# Patient Record
Sex: Female | Born: 1940 | Race: White | Hispanic: No | Marital: Married | Smoking: Never smoker
Health system: Southern US, Community
[De-identification: ages and names within clinical notes are randomized; demographics above are authoritative.]

## PROBLEM LIST (undated history)

## (undated) DIAGNOSIS — C50911 Malignant neoplasm of unspecified site of right female breast: Secondary | ICD-10-CM

## (undated) DIAGNOSIS — Z9289 Personal history of other medical treatment: Secondary | ICD-10-CM

## (undated) DIAGNOSIS — I10 Essential (primary) hypertension: Secondary | ICD-10-CM

## (undated) DIAGNOSIS — C50919 Malignant neoplasm of unspecified site of unspecified female breast: Secondary | ICD-10-CM

## (undated) DIAGNOSIS — K5090 Crohn's disease, unspecified, without complications: Secondary | ICD-10-CM

## (undated) DIAGNOSIS — K219 Gastro-esophageal reflux disease without esophagitis: Secondary | ICD-10-CM

## (undated) DIAGNOSIS — Z923 Personal history of irradiation: Secondary | ICD-10-CM

## (undated) DIAGNOSIS — M1990 Unspecified osteoarthritis, unspecified site: Secondary | ICD-10-CM

## (undated) HISTORY — DX: Crohn's disease, unspecified, without complications: K50.90

## (undated) HISTORY — PX: DILATION AND CURETTAGE OF UTERUS: SHX78

## (undated) HISTORY — DX: Malignant neoplasm of unspecified site of right female breast: C50.911

## (undated) HISTORY — PX: BREAST SURGERY: SHX581

## (undated) HISTORY — DX: Essential (primary) hypertension: I10

## (undated) HISTORY — DX: Gastro-esophageal reflux disease without esophagitis: K21.9

## (undated) HISTORY — DX: Unspecified osteoarthritis, unspecified site: M19.90

## (undated) HISTORY — DX: Personal history of other medical treatment: Z92.89

## (undated) HISTORY — PX: BREAST BIOPSY: SHX20

---
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
## 1978-05-04 HISTORY — PX: BRAIN SURGERY: SHX531

## 2010-07-30 LAB — HM COLONOSCOPY

## 2010-11-21 LAB — HM PAP SMEAR: HM Pap smear: NORMAL

## 2010-12-31 ENCOUNTER — Ambulatory Visit: Payer: Self-pay | Admitting: Internal Medicine

## 2010-12-31 LAB — HM DEXA SCAN

## 2010-12-31 LAB — HM MAMMOGRAPHY: HM Mammogram: NORMAL

## 2011-05-26 DIAGNOSIS — I10 Essential (primary) hypertension: Secondary | ICD-10-CM | POA: Diagnosis not present

## 2011-06-09 DIAGNOSIS — I10 Essential (primary) hypertension: Secondary | ICD-10-CM | POA: Diagnosis not present

## 2011-06-09 DIAGNOSIS — E785 Hyperlipidemia, unspecified: Secondary | ICD-10-CM | POA: Diagnosis not present

## 2011-06-22 DIAGNOSIS — K5010 Crohn's disease of large intestine without complications: Secondary | ICD-10-CM | POA: Diagnosis not present

## 2011-08-26 DIAGNOSIS — K5010 Crohn's disease of large intestine without complications: Secondary | ICD-10-CM | POA: Diagnosis not present

## 2011-09-01 DIAGNOSIS — H2510 Age-related nuclear cataract, unspecified eye: Secondary | ICD-10-CM | POA: Diagnosis not present

## 2011-10-12 DIAGNOSIS — K5090 Crohn's disease, unspecified, without complications: Secondary | ICD-10-CM | POA: Diagnosis not present

## 2011-10-12 DIAGNOSIS — K5010 Crohn's disease of large intestine without complications: Secondary | ICD-10-CM | POA: Diagnosis not present

## 2011-10-19 DIAGNOSIS — K5010 Crohn's disease of large intestine without complications: Secondary | ICD-10-CM | POA: Diagnosis not present

## 2011-11-27 ENCOUNTER — Ambulatory Visit (INDEPENDENT_AMBULATORY_CARE_PROVIDER_SITE_OTHER): Payer: Medicare Other | Admitting: Internal Medicine

## 2011-11-27 ENCOUNTER — Encounter: Payer: Self-pay | Admitting: Internal Medicine

## 2011-11-27 VITALS — BP 136/78 | HR 72 | Temp 98.4°F | Resp 14 | Ht 61.5 in | Wt 138.2 lb

## 2011-11-27 DIAGNOSIS — R635 Abnormal weight gain: Secondary | ICD-10-CM | POA: Diagnosis not present

## 2011-11-27 DIAGNOSIS — E785 Hyperlipidemia, unspecified: Secondary | ICD-10-CM | POA: Diagnosis not present

## 2011-11-27 DIAGNOSIS — R5383 Other fatigue: Secondary | ICD-10-CM

## 2011-11-27 DIAGNOSIS — R32 Unspecified urinary incontinence: Secondary | ICD-10-CM

## 2011-11-27 DIAGNOSIS — Z1239 Encounter for other screening for malignant neoplasm of breast: Secondary | ICD-10-CM

## 2011-11-27 DIAGNOSIS — R5381 Other malaise: Secondary | ICD-10-CM

## 2011-11-27 DIAGNOSIS — K5090 Crohn's disease, unspecified, without complications: Secondary | ICD-10-CM

## 2011-11-27 LAB — CBC WITH DIFFERENTIAL/PLATELET
Basophils Relative: 0.7 % (ref 0.0–3.0)
Eosinophils Relative: 4.7 % (ref 0.0–5.0)
HCT: 41.2 % (ref 36.0–46.0)
Hemoglobin: 13.8 g/dL (ref 12.0–15.0)
Lymphocytes Relative: 43.2 % (ref 12.0–46.0)
Lymphs Abs: 2.7 10*3/uL (ref 0.7–4.0)
Monocytes Relative: 8 % (ref 3.0–12.0)
Neutro Abs: 2.7 10*3/uL (ref 1.4–7.7)
RBC: 4.5 Mil/uL (ref 3.87–5.11)

## 2011-11-27 LAB — POCT URINALYSIS DIPSTICK
Bilirubin, UA: NEGATIVE
Blood, UA: NEGATIVE
Glucose, UA: NEGATIVE
Ketones, UA: NEGATIVE
Nitrite, UA: NEGATIVE
pH, UA: 5.5

## 2011-11-27 LAB — LIPID PANEL
HDL: 90 mg/dL (ref >39.00)
VLDL: 12.8 mg/dL (ref 0.0–40.0)

## 2011-11-27 LAB — TSH: TSH: 1.7 u[IU]/mL (ref 0.35–5.50)

## 2011-11-27 NOTE — Progress Notes (Addendum)
Patient ID: Kim Sanchez, female   DOB: 07/05/1940, 71 y.o.   MRN: [id number]  Patient Active Problem List  Diagnosis  . Crohn's disease  . Hyperlipidemia  . Arthritis  . GERD (gastroesophageal reflux disease)  . History of blood transfusion  . Weight gain    Subjective:  CC:   Chief Complaint  Patient presents with  . New Patient    HPI:   Kim Sanchez is a 71 y.o. female who presents as a new patient to establish primary care with the chief complaint of  Weight gain of 10 lbs.  Does not exercise regularly, has retired recently which is a big change in lifestyle. Has a sweet tooth.  Eats dessert daily.  History of crohn's diease 2005 by Colonoscopy with wt loss hematochezia, down to 99 lbs. [facility], Kim Sanchez,  managed with remicaide infusions every 8 weeks,  Next one due in August. Relocated from [place].  Fibrocystic breast disease, last mammogram July 2012. Last PAP and colonoscopy 2012,  Wants to repeat the PAP annually.  History of palpitations and Heart murmur,  ECHO reportedly normal Done in [place] . intolerance to statins ,   But has never tried crestor,  On red yeast rice    Past Medical History  Diagnosis Date  . Crohn's disease   . Arthritis   . GERD (gastroesophageal reflux disease)   . History of blood transfusion 1960    Past Surgical History  Procedure Date  . Dilation and curettage of uterus   . Brain surgery 1980    breast biopsies, benign    Family History  Problem Relation Age of Onset  . Cancer Paternal Grandmother     History   Social History  . Marital Status: Married    Spouse Name: N/A    Number of Children: N/A  . Years of Education: N/A   Occupational History  . Not on file.   Social History Main Topics  . Smoking status: Never Smoker   . Smokeless tobacco: Never Used  . Alcohol Use: No  . Drug Use: No  . Sexually Active: Not on file   Other Topics Concern  . Not on file   Social History Narrative  . No  narrative on file   Allergies  Allergen Reactions  . Sulfa Antibiotics      Review of Systems:  Review of Systems  Constitutional: Negative.  Negative for weight loss.  HENT: Positive for sore throat. Negative for nosebleeds.   Eyes: Negative for blurred vision and pain.  Respiratory: Negative for cough, shortness of breath and wheezing.   Cardiovascular: Negative for chest pain, orthopnea and leg swelling.  Gastrointestinal: Negative for heartburn, abdominal pain and blood in stool.  Genitourinary: Negative for dysuria and hematuria.  Musculoskeletal: Positive for joint pain. Negative for myalgias.  Skin: Negative for rash.  Neurological: Negative for dizziness, sensory change and headaches.  Endo/Heme/Allergies: Does not bruise/bleed easily.  Psychiatric/Behavioral: Negative for depression. The patient does not have insomnia.      Objective:  BP 136/78  Pulse 72  Temp 98.4 F (36.9 C) (Oral)  Resp 14  Ht 5' 1.5" (1.562 m)  Wt 138 lb 4 oz (62.71 kg)  BMI 25.70 kg/m2  SpO2 99%  General appearance: alert, cooperative and appears stated age Ears: normal TM's and external ear canals both ears Throat: lips, mucosa, and tongue normal; teeth and gums normal Neck: no adenopathy, no carotid bruit, supple, symmetrical, trachea midline and thyroid not enlarged,  symmetric, no tenderness/mass/nodules Back: symmetric, no curvature. ROM normal. No CVA tenderness. Lungs: clear to auscultation bilaterally Heart: regular rate and rhythm, S1, S2 normal, no murmur, click, rub or gallop Abdomen: soft, non-tender; bowel sounds normal; no masses,  no organomegaly Pulses: 2+ and symmetric Skin: Skin color, texture, turgor normal. No rashes or lesions Lymph nodes: Cervical, supraclavicular, and axillary nodes normal.  Assessment and Plan:  Weight gain I  recommended a low glycemic index diet utilizing smaller more frequent meals to increase metabolism.  I have also recommended that  patient start exercising with a goal of 30 minutes of aerobic exercise a minimum of 5 days per week. Screening for lipid disorders, thyroid and diabetes to be done today.    Hyperlipidemia Managed with red yeast rice given her history of statin intolerance. She'll return prior to her annual physical for fasting lipids.  Crohn's disease Man[place]. She receives with a bimonthly Remicade infusions. Symptoms are currently controlled.   Updated Medication List Outpatient Encounter Prescriptions as of 11/27/2011  Medication Sig Dispense Refill  . Biotin 10 MG CAPS Take by mouth.      . Calcium Carb-Cholecalciferol (CALCIUM + D3) 600-200 MG-UNIT TABS Take by mouth.      . docusate sodium (COLACE) 100 MG capsule Take 100 mg by mouth 2 (two) times daily.      . fish oil-omega-3 fatty acids 1000 MG capsule Take 2 g by mouth daily.      Marland Kitchen ibuprofen (ADVIL,MOTRIN) 200 MG tablet Take 200 mg by mouth every 6 (six) hours as needed.      Marland Kitchen LORazepam (ATIVAN) 0.5 MG tablet Take 0.5 mg by mouth daily as needed.      . Multiple Vitamin (MULTIVITAMIN) tablet Take 1 tablet by mouth daily.      . polyethylene glycol (MIRALAX / GLYCOLAX) packet Take 17 g by mouth daily.      . Probiotic Product (PROBIOTIC DAILY PO) Take by mouth.      . Red Yeast Rice 600 MG CAPS Take by mouth.         Orders Placed This Encounter  Procedures  . MM Digital Screening  . Lipid panel  . COMPLETE METABOLIC PANEL WITH GFR  . TSH  . CBC with Differential  . LDL cholesterol, direct  . POCT Urinalysis Dipstick    No Follow-up on file.

## 2011-11-27 NOTE — Patient Instructions (Addendum)
Go to the Health Dept for your TdaP and possibly your pneumonvax vaccine (free there)   Return for annual PE with PAP and breast exam  Consider a Low Glycemic Index Diet and eating 6 smaller meals daily .  This frequent feeding stimulates your metabolism and the lower glycemic index foods will lower your blood sugars:   This is an example of my daily  "Low GI"  Diet:  All of the foods can be found at grocery stores and in bulk at [employer]  club   7 AM Breakfast:  Low carbohydrate Protein  Shakes (I recommend the EAS AdvantEdge "Carb Control" shakes  Or the low carb shakes by Atkins.   Both are available everywhere:  In  cases at [employer]  Or in 4 packs at grocery stores and pharmacies  2.5 carbs  (Alternative is  a toasted Arnold's Sandwhich Thin w/ peanut butter, a "Bagel Thin" with cream cheese and salmon) or  a scrambled egg burrito made with a low carb tortilla .  Avoid cereal and bananas, oatmeal too unless the old fashioned kind that takes 30-40 minutes to prepare.  the rest is overly processed, has minimal fiber, and loaded with carbohydrates!   10 AM: Protein bar by Atkins (the snack size, under 200 cal.  There are many varieties , available widely again or in bulk in limited varieties at [employer])  Other so called "protein bars" tend to be loaded with carbohydrates.  Remember, in food advertising, the word "energy" is synonymous for " carbohydrate."  Lunch: sandwich of [place], (or any lunchmeat or canned tuna), fresh avocado and cheese on a lower carbohydrate pita bread, flatbread, or tortilla . Ok to use mayonnaise. The bread is the only source or carbohydrate that can be decreased (Joseph's makes a pita bread and a flat bread  Are 50 cal and 4 net carbs ; Toufayan makes a low carb flatbread 100 cal and 9 net carbs  and  Mission makes a low carb whole wheat tortilla  210 cal and 6 net carbs)  3 PM:  Mid day :  Another proteintttt bThe brear,  Or a  cheese stick (100 cal, 0 carbs),  Or 1 ounce of  almonds,  walnuts, pistachios, pecans, peanuts,  Macadamia nuts. Or a Dannon light n Fit greek yogurt, 80 cal 8 net carbs . Avoid "granola"; the dried cranberries and raisins are loaded with carbohydrates.    6 PM  Dinner:  "mean and green:"  Meat/chicken/fish or a high protein legume; , with a green salad, and a low GI  Veggie (broccoli, cauliflower, green beans, spinach, brussel sprouts. Lima beans) : Avoid "Low fat dressings, Barry Brunner and [place]! They are loaded with sugar! Instead use ranch, vinagrette,  Blue cheese, etc  9 PM snack : Breyer's "low carb" fudgsicle or  ice cream bar (Carb Smart line), or  Weight Watcher's ice cream bar , or anouther "no sugar added" ice cream; or another protein shake or a serving of fresh fruit with whipped cream (Avoid bananas, pineapple, grapes  and watermelon on a regular basis because they are high in sugar)   Remember that snack Substitutions should be less than 15 to 20 carbs  Per serving. Remember to subtract fiber grams to get the "net carbs."

## 2011-11-28 LAB — COMPLETE METABOLIC PANEL WITH GFR
AST: 21 U/L (ref 0–37)
Albumin: 4.4 g/dL (ref 3.5–5.2)
Alkaline Phosphatase: 70 U/L (ref 39–117)
BUN: 16 mg/dL (ref 6–23)
GFR, Est Non African American: 87 mL/min
Glucose, Bld: 87 mg/dL (ref 70–99)
Potassium: 4.1 mEq/L (ref 3.5–5.3)
Sodium: 142 mEq/L (ref 135–145)
Total Bilirubin: 1 mg/dL (ref 0.3–1.2)

## 2011-11-29 ENCOUNTER — Encounter: Payer: Self-pay | Admitting: Internal Medicine

## 2011-11-29 DIAGNOSIS — R635 Abnormal weight gain: Secondary | ICD-10-CM | POA: Insufficient documentation

## 2011-11-29 DIAGNOSIS — K219 Gastro-esophageal reflux disease without esophagitis: Secondary | ICD-10-CM | POA: Insufficient documentation

## 2011-11-29 DIAGNOSIS — R32 Unspecified urinary incontinence: Secondary | ICD-10-CM | POA: Insufficient documentation

## 2011-11-29 DIAGNOSIS — M1990 Unspecified osteoarthritis, unspecified site: Secondary | ICD-10-CM | POA: Insufficient documentation

## 2011-11-29 DIAGNOSIS — Z9289 Personal history of other medical treatment: Secondary | ICD-10-CM | POA: Insufficient documentation

## 2011-11-29 NOTE — Assessment & Plan Note (Addendum)
I  recommended a low glycemic index diet utilizing smaller more frequent meals to increase metabolism.  I have also recommended that patient start exercising with a goal of 30 minutes of aerobic exercise a minimum of 5 days per week. Screening for lipid disorders, thyroid and diabetes was done today. There were no signs of diabetes or hypothyroidism.

## 2011-11-29 NOTE — Assessment & Plan Note (Signed)
Managed by gastroenterologist in [place]. She receives with a bimonthly Remicade infusions. Symptoms are currently controlled.

## 2011-11-29 NOTE — Assessment & Plan Note (Addendum)
Managed with red yeast rice given her history of statin intolerance. Although her LDL is 150 today her HDL is 90. She has no history of diabetes hypertension or early family history of coronary artery disease. No changes today

## 2011-11-29 NOTE — Assessment & Plan Note (Signed)
She has no signs of  urinary tract infection by dipstick UA. When she returns for her annual physical in one month will evaluate  for vaginal atrophy and cystocele.

## 2011-12-16 DIAGNOSIS — K5010 Crohn's disease of large intestine without complications: Secondary | ICD-10-CM | POA: Diagnosis not present

## 2011-12-25 ENCOUNTER — Encounter: Payer: Self-pay | Admitting: Internal Medicine

## 2011-12-30 DIAGNOSIS — L819 Disorder of pigmentation, unspecified: Secondary | ICD-10-CM | POA: Diagnosis not present

## 2011-12-30 DIAGNOSIS — D1801 Hemangioma of skin and subcutaneous tissue: Secondary | ICD-10-CM | POA: Diagnosis not present

## 2012-01-14 ENCOUNTER — Ambulatory Visit: Payer: Self-pay | Admitting: Internal Medicine

## 2012-01-14 DIAGNOSIS — Z1231 Encounter for screening mammogram for malignant neoplasm of breast: Secondary | ICD-10-CM | POA: Diagnosis not present

## 2012-01-18 ENCOUNTER — Encounter: Payer: Self-pay | Admitting: Internal Medicine

## 2012-01-18 ENCOUNTER — Ambulatory Visit (INDEPENDENT_AMBULATORY_CARE_PROVIDER_SITE_OTHER): Payer: Medicare Other | Admitting: Internal Medicine

## 2012-01-18 ENCOUNTER — Other Ambulatory Visit (HOSPITAL_COMMUNITY)
Admission: RE | Admit: 2012-01-18 | Discharge: 2012-01-18 | Disposition: A | Discharge status: Home / Self Care | Payer: Medicare Other | Source: Ambulatory Visit | Attending: Internal Medicine | Admitting: Internal Medicine

## 2012-01-18 VITALS — BP 148/70 | HR 80 | Temp 98.5°F | Resp 14 | Ht 61.5 in | Wt 139.8 lb

## 2012-01-18 DIAGNOSIS — Z1211 Encounter for screening for malignant neoplasm of colon: Secondary | ICD-10-CM | POA: Insufficient documentation

## 2012-01-18 DIAGNOSIS — Z1151 Encounter for screening for human papillomavirus (HPV): Secondary | ICD-10-CM | POA: Diagnosis not present

## 2012-01-18 DIAGNOSIS — Z124 Encounter for screening for malignant neoplasm of cervix: Secondary | ICD-10-CM | POA: Diagnosis not present

## 2012-01-18 DIAGNOSIS — Z0000 Encounter for general adult medical examination without abnormal findings: Secondary | ICD-10-CM

## 2012-01-18 DIAGNOSIS — K5090 Crohn's disease, unspecified, without complications: Secondary | ICD-10-CM

## 2012-01-18 DIAGNOSIS — Z1239 Encounter for other screening for malignant neoplasm of breast: Secondary | ICD-10-CM

## 2012-01-18 MED ORDER — LORAZEPAM 0.5 MG PO TABS: 0.5000 mg | ORAL_TABLET | Freq: Every day | ORAL | Status: DC | PRN

## 2012-01-18 NOTE — Assessment & Plan Note (Signed)
This exam was normal today. She is up-to-date on mammograms.

## 2012-01-18 NOTE — Progress Notes (Signed)
Patient ID: Kim Sanchez, female   DOB: 07-Jul-1940, 71 y.o.   MRN: [id number] The patient is here for annual Medicare wellness examination and management of other chronic and acute problems.   The risk factors are reflected in the social history.  The roster of all physicians providing medical care to patient - is listed in the Snapshot section of the chart.  Activities of daily living:  The patient is 100% independent in all ADLs: dressing, toileting, feeding as well as independent mobility  Home safety : The patient has smoke detectors in the home. They wear seatbelts.  There are no firearms at home. There is no violence in the home.   There is no risks for hepatitis, STDs or HIV. There is no   history of blood transfusion. They have no travel history to infectious disease endemic areas of the world.  The patient has seen their dentist in the last six month. They have seen their eye doctor in the last year. They admit to slight hearing difficulty with regard to whispered voices and some television programs.  They have deferred audiologic testing in the last year.  They do not  have excessive sun exposure. Discussed the need for sun protection: hats, long sleeves and use of sunscreen if there is significant sun exposure.   Diet: the importance of a healthy diet is discussed. They do have a healthy diet.  The benefits of regular aerobic exercise were discussed. She walks 4 times per week ,  20 minutes.   Depression screen: there are no signs or vegative symptoms of depression- irritability, change in appetite, anhedonia, sadness/tearfullness.  Cognitive assessment: the patient manages all their financial and personal affairs and is actively engaged. They could relate day,date,year and events; recalled 2/3 objects at 3 minutes; performed clock-face test normally.  The following portions of the patient's history were reviewed and updated as appropriate: allergies, current medications, past family  history, past medical history,  past surgical history, past social history  and problem list.  Visual acuity was not assessed per patient preference since she has regular follow up with her ophthalmologist. Hearing and body mass index were assessed and reviewed.   During the course of the visit the patient was educated and counseled about appropriate screening and preventive services including : fall prevention , diabetes screening, nutrition counseling, colorectal cancer screening, and recommended immunizations.    BP 148/70  Pulse 80  Temp 98.5 F (36.9 C) (Oral)  Resp 14  Ht 5' 1.5" (1.562 m)  Wt 139 lb 12 oz (63.39 kg)  BMI 25.98 kg/m2  SpO2 96%  .General appearance: alert, cooperative and appears stated age Head: Normocephalic, without obvious abnormality, atraumatic Eyes: conjunctivae/corneas clear. PERRL, EOM's intact. Fundi benign. Ears: normal TM's and external ear canals both ears Nose: Nares normal. Septum midline. Mucosa normal. No drainage or sinus tenderness. Throat: lips, mucosa, and tongue normal; teeth and gums normal Neck: no adenopathy, no carotid bruit, no JVD, supple, symmetrical, trachea midline and thyroid not enlarged, symmetric, no tenderness/mass/nodules Lungs: clear to auscultation bilaterally Breasts: normal appearance, no masses or tenderness Heart: regular rate and rhythm, S1, S2 normal, no murmur, click, rub or gallop Abdomen: soft, non-tender; bowel sounds normal; no masses,  no organomegaly GYN: normal external genitalia, normal cervix, ovaries nonpalpable Extremities: extremities normal, atraumatic, no cyanosis or edema Pulses: 2+ and symmetric Skin: Skin color, texture, turgor normal. No rashes or lesions Neurologic: Alert and oriented X 3, normal strength and tone. Normal symmetric reflexes. Normal  coordination and gait.  Assessment and Plan  Screening for cervical cancer Patient has requested annual Pap smear despite recommendations for  screening every 2-3 years and not covered by Medicare for low-risk patient  Screening for breast cancer This exam was normal today. She is up-to-date on mammograms.  Screening for colon cancer She is up-to-date with screening for colon cancer due to her frequent colonoscopies  for management of inflammatory bowel disease.   Updated Medication List Outpatient Encounter Prescriptions as of 01/18/2012  Medication Sig Dispense Refill  . Biotin 10 MG CAPS Take by mouth.      . Calcium Carb-Cholecalciferol (CALCIUM + D3) 600-200 MG-UNIT TABS Take by mouth.      . docusate sodium (COLACE) 100 MG capsule Take 100 mg by mouth 2 (two) times daily.      . fish oil-omega-3 fatty acids 1000 MG capsule Take 2 g by mouth daily.      Marland Kitchen ibuprofen (ADVIL,MOTRIN) 200 MG tablet Take 200 mg by mouth every 6 (six) hours as needed.      Marland Kitchen LORazepam (ATIVAN) 0.5 MG tablet Take 1 tablet (0.5 mg total) by mouth daily as needed.  30 tablet  3  . Multiple Vitamin (MULTIVITAMIN) tablet Take 1 tablet by mouth daily.      . polyethylene glycol (MIRALAX / GLYCOLAX) packet Take 17 g by mouth daily.      . Probiotic Product (PROBIOTIC DAILY PO) Take by mouth.      . Red Yeast Rice 600 MG CAPS Take by mouth.      . DISCONTD: LORazepam (ATIVAN) 0.5 MG tablet Take 0.5 mg by mouth daily as needed.

## 2012-01-18 NOTE — Assessment & Plan Note (Signed)
Patient has requested annual Pap smear despite recommendations for screening every 2-3 years and not covered by Medicare for low-risk patient

## 2012-01-18 NOTE — Assessment & Plan Note (Signed)
She is up-to-date with screening for colon cancer due to her frequent colonoscopies  for management of inflammatory bowel disease.

## 2012-01-18 NOTE — Patient Instructions (Addendum)
The Tdap  Vaccine will cost $110.00 here at the office.  Check with Health Dept   The other vaccines are covered by Medicare but do have an administration fee ($40), so they may cost you less at the local pharmacies, unless your insurance requires you to have them done at the doctor's office.

## 2012-01-20 ENCOUNTER — Encounter: Payer: Self-pay | Admitting: Internal Medicine

## 2012-02-08 DIAGNOSIS — K5010 Crohn's disease of large intestine without complications: Secondary | ICD-10-CM | POA: Diagnosis not present

## 2012-03-10 DIAGNOSIS — Z23 Encounter for immunization: Secondary | ICD-10-CM | POA: Diagnosis not present

## 2012-04-12 DIAGNOSIS — K5010 Crohn's disease of large intestine without complications: Secondary | ICD-10-CM | POA: Diagnosis not present

## 2012-05-04 HISTORY — PX: BREAST BIOPSY: SHX20

## 2012-05-09 ENCOUNTER — Encounter: Payer: Self-pay | Admitting: Internal Medicine

## 2012-05-09 ENCOUNTER — Ambulatory Visit (INDEPENDENT_AMBULATORY_CARE_PROVIDER_SITE_OTHER): Payer: Medicare Other | Admitting: Internal Medicine

## 2012-05-09 VITALS — BP 150/88 | HR 68 | Temp 98.6°F | Resp 12 | Ht 61.5 in | Wt 140.0 lb

## 2012-05-09 DIAGNOSIS — J069 Acute upper respiratory infection, unspecified: Secondary | ICD-10-CM | POA: Diagnosis not present

## 2012-05-09 MED ORDER — AZITHROMYCIN 500 MG PO TABS: 500.0000 mg | ORAL_TABLET | Freq: Every day | ORAL | Status: DC

## 2012-05-09 MED ORDER — NYSTATIN 100000 UNIT/ML MT SUSP: 500000.0000 [IU] | Freq: Four times a day (QID) | OROMUCOSAL | Status: DC

## 2012-05-09 NOTE — Assessment & Plan Note (Signed)
This URI is most likely viral given the mild HEENT  Exam.  have explained that in viral URIS, an antibiotic will not help the symptoms and will increase the risk of developing diarrhea.,  Continue oral and nasal decongestants,  Ibuprofen 400 mg and tylenol 650 mq 8 hrs for aches and pains,  And will addtessalon 100 mg every 8 hours prn cough  And seconda round of abx only if symptoms worsen to include fevers, facial pain, purulent sputum./drainage.

## 2012-05-09 NOTE — Progress Notes (Signed)
Patient ID: Kim Sanchez, female   DOB: 03/15/1941, 72 y.o.   MRN: [id number]   . Patient Active Problem List  Diagnosis  . Crohn's disease  . Hyperlipidemia  . Arthritis  . GERD (gastroesophageal reflux disease)  . History of blood transfusion  . Weight gain  . Urinary incontinence  . Screening for cervical cancer  . Screening for breast cancer  . Screening for colon cancer  . Viral URI with cough    Subjective:  CC:   Chief Complaint  Patient presents with  . head congestion    headache, sore throat sinus drainage    HPI:   Kim Sanchez a 72 y.o. female who presents with a 3  day history of sore throat, cough and post nasal drip,  Using rock n rye to suppress the cough   Nasal drainage is clear byut having streaks of blood.  Warm salt water via [employer].  Voice is hoarse.  Ears not bothering her .   No fevers yet    Past Medical History  Diagnosis Date  . Crohn's disease   . Arthritis   . GERD (gastroesophageal reflux disease)   . History of blood transfusion 1960    Past Surgical History  Procedure Date  . Dilation and curettage of uterus   . Brain surgery 1980    breast biopsies, benign         The following portions of the patient's history were reviewed and updated as appropriate: Allergies, current medications, and problem list.    Review of Systems:   12 Pt  review of systems was negative except those addressed in the HPI,     History   Social History  . Marital Status: Married    Spouse Name: N/A    Number of Children: N/A  . Years of Education: N/A   Occupational History  . Not on file.   Social History Main Topics  . Smoking status: Never Smoker   . Smokeless tobacco: Never Used  . Alcohol Use: No  . Drug Use: No  . Sexually Active: Not on file   Other Topics Concern  . Not on file   Social History Narrative  . No narrative on file    Objective:  BP 150/88  Pulse 68  Temp 98.6 F (37 C) (Oral)  Resp 12  Ht 5'  1.5" (1.562 m)  Wt 140 lb (63.504 kg)  BMI 26.02 kg/m2  SpO2 96%  General appearance: alert, cooperative and appears stated age Ears: normal TM's and external ear canals both ears Throat: lips, mucosa, and tongue normal; teeth and gums normal Neck: no adenopathy, no carotid bruit, supple, symmetrical, trachea midline and thyroid not enlarged, symmetric, no tenderness/mass/nodules Back: symmetric, no curvature. ROM normal. No CVA tenderness. Lungs: clear to auscultation bilaterally Heart: regular rate and rhythm, S1, S2 normal, no murmur, click, rub or gallop Abdomen: soft, non-tender; bowel sounds normal; no masses,  no organomegaly Pulses: 2+ and symmetric Skin: Skin color, texture, turgor normal. No rashes or lesions Lymph nodes: Cervical, supraclavicular, and axillary nodes normal.  Assessment and Plan:  Viral URI with cough This URI is most likely viral given the mild HEENT  Exam.  have explained that in viral URIS, an antibiotic will not help the symptoms and will increase the risk of developing diarrhea.,  Continue oral and nasal decongestants,  Ibuprofen 400 mg and tylenol 650 mq 8 hrs for aches and pains,  And will addtessalon 100 mg every 8 hours  prn cough  And seconda round of abx only if symptoms worsen to include fevers, facial pain, purulent sputum./drainage.    Updated Medication List Outpatient Encounter Prescriptions as of 05/09/2012  Medication Sig Dispense Refill  . Biotin 10 MG CAPS Take by mouth.      . Calcium Carb-Cholecalciferol (CALCIUM + D3) 600-200 MG-UNIT TABS Take by mouth daily.       . Fiber CHEW Chew by mouth as needed.      . fish oil-omega-3 fatty acids 1000 MG capsule Take 2 g by mouth daily.      Marland Kitchen ibuprofen (ADVIL,MOTRIN) 200 MG tablet Take 200 mg by mouth every 6 (six) hours as needed.      Marland Kitchen LORazepam (ATIVAN) 0.5 MG tablet Take 1 tablet (0.5 mg total) by mouth daily as needed.  30 tablet  3  . Multiple Vitamin (MULTIVITAMIN) tablet Take 1 tablet by  mouth daily.      . polyethylene glycol (MIRALAX / GLYCOLAX) packet Take 17 g by mouth daily as needed.       . Red Yeast Rice 600 MG CAPS Take by mouth daily.       Marland Kitchen azithromycin (ZITHROMAX) 500 MG tablet Take 1 tablet (500 mg total) by mouth daily.  7 tablet  0  . docusate sodium (COLACE) 100 MG capsule Take 100 mg by mouth 2 (two) times daily as needed.       . nystatin (MYCOSTATIN) 100000 UNIT/ML suspension Take 5 mLs (500,000 Units total) by mouth 4 (four) times daily.  120 mL  1  . Probiotic Product (PROBIOTIC DAILY PO) Take by mouth.         No orders of the defined types were placed in this encounter.    No Follow-up on file.

## 2012-05-09 NOTE — Patient Instructions (Addendum)
You do not have a bacterial infection (yet) .  You have a viral  Syndrome .  The post nasal drip is causing your sore throat and cough.  Lavage your sinuses twice daly with Simply saline nasal spray.  Use benadryl 25 mg every 8 hours and Sudafed PE 10 to 30 every 8 hours to manage the drainage and congestion.  Gargle with salt water often for the sore throat.  Use delsym for daytime cough and rock n rye for nighttime cough   mucinex for thick drainage that is hard to clear.   If the throat is no better  In 3 to 4 days OR  if you develop T > 100.4,  Green nasal discharge,  Or deep productive cough start the azithromycin.  If your ears become painful, I would recommend a different antibiotic so call me.

## 2012-05-12 ENCOUNTER — Telehealth: Payer: Self-pay | Admitting: *Deleted

## 2012-05-12 NOTE — Telephone Encounter (Signed)
Dr. Derrel Nip,  The patient called stating the antibiotic you gave her for her "bacterial infection" was making her nauseous and diarrhea.  She couldn't tell me the name but new it was 500 mg, after looking at her medicine list I saw that it was the Zithromax. She said she was going to cut them in half and take 250 mg directed as you prescribed for the 500 mg. She has had this dosage before she stated.

## 2012-06-06 DIAGNOSIS — K5010 Crohn's disease of large intestine without complications: Secondary | ICD-10-CM | POA: Diagnosis not present

## 2012-06-06 DIAGNOSIS — K5090 Crohn's disease, unspecified, without complications: Secondary | ICD-10-CM | POA: Diagnosis not present

## 2012-06-18 ENCOUNTER — Other Ambulatory Visit: Payer: Self-pay

## 2012-07-12 ENCOUNTER — Telehealth: Payer: Self-pay | Admitting: Internal Medicine

## 2012-07-12 NOTE — Telephone Encounter (Signed)
Pt called wanting to know if she could get a tb test done here at the office.  Dr Gracelyn Nurse in [place] her chrons doctor wanting her get one Pt would like you to email the reponse

## 2012-07-13 ENCOUNTER — Encounter: Payer: Self-pay | Admitting: General Practice

## 2012-08-01 DIAGNOSIS — K5010 Crohn's disease of large intestine without complications: Secondary | ICD-10-CM | POA: Diagnosis not present

## 2012-08-10 ENCOUNTER — Ambulatory Visit: Payer: Self-pay | Admitting: Internal Medicine

## 2012-08-10 ENCOUNTER — Ambulatory Visit (INDEPENDENT_AMBULATORY_CARE_PROVIDER_SITE_OTHER): Payer: Medicare Other | Admitting: Adult Health

## 2012-08-10 ENCOUNTER — Encounter: Payer: Self-pay | Admitting: Adult Health

## 2012-08-10 VITALS — BP 148/86 | HR 69 | Temp 98.1°F | Resp 16 | Wt 140.5 lb

## 2012-08-10 DIAGNOSIS — N63 Unspecified lump in unspecified breast: Secondary | ICD-10-CM | POA: Diagnosis not present

## 2012-08-10 DIAGNOSIS — N6315 Unspecified lump in the right breast, overlapping quadrants: Secondary | ICD-10-CM | POA: Insufficient documentation

## 2012-08-10 NOTE — Progress Notes (Signed)
  Subjective:    Patient ID: Kim Sanchez, female    DOB: 1940/08/03, 72 y.o.   MRN: [id number]  HPI  Patient is a pleasant 72 y/o female with hx of fibrocystic breasts s/p several biopsies who presents to clinic with concerns of a breast mass. She was doing her monthly self breast exam this past Sunday and noticed a lump on right breast. She denies any drainage from nipple or dimpling.  Current Outpatient Prescriptions on File Prior to Visit  Medication Sig Dispense Refill  . Biotin 10 MG CAPS Take by mouth.      . Calcium Carb-Cholecalciferol (CALCIUM + D3) 600-200 MG-UNIT TABS Take by mouth daily.       Marland Kitchen docusate sodium (COLACE) 100 MG capsule Take 100 mg by mouth 2 (two) times daily as needed.       . Fiber CHEW Chew by mouth as needed.      . fish oil-omega-3 fatty acids 1000 MG capsule Take 2 g by mouth daily.      Marland Kitchen ibuprofen (ADVIL,MOTRIN) 200 MG tablet Take 200 mg by mouth every 6 (six) hours as needed.      Marland Kitchen LORazepam (ATIVAN) 0.5 MG tablet Take 1 tablet (0.5 mg total) by mouth daily as needed.  30 tablet  3  . Multiple Vitamin (MULTIVITAMIN) tablet Take 1 tablet by mouth daily.      . polyethylene glycol (MIRALAX / GLYCOLAX) packet Take 17 g by mouth daily as needed.       . Probiotic Product (PROBIOTIC DAILY PO) Take by mouth.      . Red Yeast Rice 600 MG CAPS Take by mouth daily.        No current facility-administered medications on file prior to visit.     Review of Systems  Skin: Negative for color change and rash.       No skin changes noted of bilateral breast  Lump on right breast.   BP 148/86  Pulse 69  Temp(Src) 98.1 F (36.7 C) (Oral)  Resp 16  Wt 140 lb 8 oz (63.73 kg)  BMI 26.12 kg/m2  SpO2 98%    Objective:   Physical Exam   Palpable mass approximately the size of a nickel noted at 12 o'clock. There is no dimpling of the breast or drainage from the nipple. There is no skin discoloration. No swollen lymph nodes appreciated.     Assessment & Plan:

## 2012-08-10 NOTE — Assessment & Plan Note (Signed)
Diagnostic mammogram unilateral of the right breast and ultrasound today at [facility] breast center.

## 2012-08-10 NOTE — Patient Instructions (Addendum)
  You have an appointment at [facility] today at 2:30 pm for a unilateral diagnostic mammogram of the right breast and ultrasound.  [place]

## 2012-08-11 ENCOUNTER — Telehealth: Payer: Self-pay | Admitting: Internal Medicine

## 2012-08-11 DIAGNOSIS — N6310 Unspecified lump in the right breast, unspecified quadrant: Secondary | ICD-10-CM

## 2012-08-11 NOTE — Telephone Encounter (Signed)
She had a mammogram that was abnormal on the right and her ultrasound was also abnormal. There is a 1.6 cm  nodules that needs to be biopsied. I would like to make a referral as soon as possible .Does she have a preference who I send her to ?

## 2012-08-12 NOTE — Telephone Encounter (Signed)
Referral is in process Dr Bary Castilla and Jamal Collin

## 2012-08-12 NOTE — Telephone Encounter (Signed)
Patient notified as instructed. Patient leaves preference up to you, but prefers local if you thinks that is best.

## 2012-08-12 NOTE — Telephone Encounter (Signed)
Noted  

## 2012-08-16 ENCOUNTER — Encounter: Payer: Self-pay | Admitting: Internal Medicine

## 2012-08-24 ENCOUNTER — Ambulatory Visit (INDEPENDENT_AMBULATORY_CARE_PROVIDER_SITE_OTHER): Payer: Medicare Other | Admitting: General Surgery

## 2012-08-24 ENCOUNTER — Encounter: Payer: Self-pay | Admitting: General Surgery

## 2012-08-24 ENCOUNTER — Other Ambulatory Visit: Payer: Self-pay

## 2012-08-24 VITALS — BP 176/86 | HR 74 | Resp 12 | Ht 61.5 in | Wt 138.0 lb

## 2012-08-24 DIAGNOSIS — C50911 Malignant neoplasm of unspecified site of right female breast: Secondary | ICD-10-CM | POA: Insufficient documentation

## 2012-08-24 DIAGNOSIS — N63 Unspecified lump in unspecified breast: Secondary | ICD-10-CM

## 2012-08-24 DIAGNOSIS — C50919 Malignant neoplasm of unspecified site of unspecified female breast: Secondary | ICD-10-CM | POA: Diagnosis not present

## 2012-08-24 HISTORY — DX: Malignant neoplasm of unspecified site of right female breast: C50.911

## 2012-08-24 NOTE — Progress Notes (Signed)
Patient ID: Kim Sanchez, female   DOB: [birth year], 72 y.o.   MRN: [id number]  Chief Complaint  Patient presents with  . Breast Problem    HPI Kim Sanchez is a 72 y.o. female. Patient here today for breast evaluation.  States about April 6th 2014 she felt a lump in the right breast doing her routine self breast checks.  She has a known history of fibroid cyst in both breast being removed during the 1980's in [place].  Denies any breast injury but does states she was moving boxes in Feb/March but does not remember any trauma to the area. Denies any weight change. No family history of breast cancer.  HPI  Past Medical History  Diagnosis Date  . Crohn's disease   . Arthritis   . GERD (gastroesophageal reflux disease)   . History of blood transfusion 1960    Past Surgical History  Procedure Laterality Date  . Dilation and curettage of uterus    . Brain surgery  1980    breast biopsies, benign  . Breast surgery  1980's    fibro cyst both breast in [place]    Family History  Problem Relation Age of Onset  . Cancer Paternal Grandmother     Social History History  Substance Use Topics  . Smoking status: Never Smoker   . Smokeless tobacco: Never Used  . Alcohol Use: No    Allergies  Allergen Reactions  . Sulfa Antibiotics     Current Outpatient Prescriptions  Medication Sig Dispense Refill  . inFLIXimab (REMICADE) 100 MG injection Inject 1 mg into the vein every 8 (eight) weeks.      . Biotin 10 MG CAPS Take by mouth.      . Calcium Carb-Cholecalciferol (CALCIUM + D3) 600-200 MG-UNIT TABS Take by mouth daily.       Marland Kitchen docusate sodium (COLACE) 100 MG capsule Take 100 mg by mouth 2 (two) times daily as needed.       . Fiber CHEW Chew by mouth as needed.      . fish oil-omega-3 fatty acids 1000 MG capsule Take 2 g by mouth daily.      Marland Kitchen ibuprofen (ADVIL,MOTRIN) 200 MG tablet Take 200 mg by mouth every 6 (six) hours as needed.      Marland Kitchen LORazepam (ATIVAN) 0.5 MG tablet Take 1  tablet (0.5 mg total) by mouth daily as needed.  30 tablet  3  . Multiple Vitamin (MULTIVITAMIN) tablet Take 1 tablet by mouth daily.      . polyethylene glycol (MIRALAX / GLYCOLAX) packet Take 17 g by mouth daily as needed.       . Probiotic Product (PROBIOTIC DAILY PO) Take by mouth.      . Red Yeast Rice 600 MG CAPS Take by mouth daily.        No current facility-administered medications for this visit.    Review of Systems Review of Systems  Constitutional: Negative.   Respiratory: Negative.  Negative for shortness of breath.   Cardiovascular: Negative.  Negative for chest pain and palpitations.    Blood pressure 176/86, pulse 74, resp. rate 12, height 5' 1.5" (1.562 m), weight 138 lb (62.596 kg).  Physical Exam Physical Exam  Constitutional: She appears well-developed and well-nourished.  Cardiovascular: Normal rate and regular rhythm.   Murmur heard.  Systolic murmur is present with a grade of 3/6  Upper right sternal border   Pulmonary/Chest: Effort normal and breath sounds normal. Right breast exhibits no inverted  nipple, no nipple discharge, no skin change and no tenderness. Left breast exhibits no inverted nipple, no mass, no nipple discharge, no skin change and no tenderness.  Left breast 2 cm lower than right breast Well healed incision in upper outer quadrant 12 oclock 2cm thickening in right breast Small nodes in right axillary area   Lymphadenopathy:    She has no cervical adenopathy.    She has axillary adenopathy.  Neurological: She is alert.  Skin: Skin is warm and dry.    Data Reviewed ROCEDURE: MAM - MAM DGTL UNI MAM RT BREAST W/CAD  - Aug 10 2012  2:50PM   RESULT:   Comparisons: 01/14/2012, 12/31/2010, 09/18/2009, 08/01/2007, and 06/10/2005.  Findings: The breast tissue is heterogeneously dense, which may lower the  sensitivity of mammography. A marker was placed along the superior right  breast at approximately 12 o'clock secondary to reported palpable   abnormality at this site. Spot compression magnification views were  performed of this region. There is suggestion of subtle architectural   distortion in this region, though no distinct mass is seen by  mammography. There are diffusely scattered round calcifications in the  superior right breast, which are seen on the spot compression  magnification views, which are similar to the prior studies. Small  asymmetry in the inferior aspect of the right MLO view is similar to  prior studies including 08/01/2007.  Real-time ultrasound was performed of the superior right breast at the  site of reported palpable abnormality. At this site, there is a mass with  ill-defined borders. It is difficult to be certain of the size given the  ill-defined borders and the appearance of focal area of hypoechogenicity  as well as surrounding hyperechogenicity. The more focal area of  hypoechogenicity measures 1.6 x 1.2 x 0.8 cm. It is uncertain if the  adjacent hyperechogenicity is related to this mass. There is some  posterior acoustic shadowing. IMPRESSION:    BI-RADS: Category 4 - Suspicious Abnormality.    Ultrasound examination of the right breast in the area of palpable fullness 4 cm from the nipples 12:00 position showed a prominent hypoechoic mass with focal areas of shadowing. This measured 0.9 x 1.33 x 1.46 cm. Examination of the right axilla showed a small normal-appearing lymph node measuring no more than 0.6 cm in diameter.  The patient was amenable to a vacuum biopsy of the right breast mass. This was completed using 10 cc of 0.5% Xylocaine with 0.25% Marcaine with 1-200,000 units of epinephrine. Chlor prep was applied to the skin. A 14-gauge Anastomosis was utilized and multiple core samples taken through multiple locations of the mass. No bleeding was noted. No discomfort. The skin defect was closed with benzoin and Steri-Strip followed by a Telfa and Tegaderm dressing.  Assessment    Right  breast mass, suspicious for malignancy.     Plan    The patient and her husband were advised of my concerns for the newly identified right breast mass. They will be contacted with the pathology when it becomes available.       [name] 08/24/2012, 10:03 PM

## 2012-08-24 NOTE — Patient Instructions (Addendum)
The patient is aware to call back for any questions or concerns.    CARE AFTER BREAST BIOPSY  1. Leave the dressing on that your doctor applied after surgery. It is waterproof. You may bathe, shower and/or swim. The dressing will probably remain intact until your return office visit. If the dressing comes off, you will see small strips of tape against your skin on the incision. Do not remove these strips.  2. You may want to use a gauze,cloth or similar protection in your bra to prevent rubbing against your dressing and incision. This is not necessary, but you may feel more comfortable doing so.  3. It is recommended that you wear a bra day and night to give support to the breast. This will prevent the weight of the breast from pulling on the incision.  4. Your breast will feel hard and lumpy under the incision. Do not be alarmed. This is the underlying stitching of tissue. Softening of this tissue will occur in time.  5. Make sure you call the office and schedule an appointment in one week after your surgery. The office phone number is [phone]. The nurses at [facility] may have already done this for you.  6. You will notice about a week after your office visit that the strips of the tape on your incision will begin to loosen. These may then be removed.  7. Report to your doctor any of the following:  * Severe pain not relieved by your pain medication  *Redness of the incision  * Drainage from the incision  *Fever greater than 101 degrees

## 2012-08-25 ENCOUNTER — Other Ambulatory Visit: Payer: Self-pay | Admitting: *Deleted

## 2012-08-25 DIAGNOSIS — K5090 Crohn's disease, unspecified, without complications: Secondary | ICD-10-CM

## 2012-08-25 NOTE — Telephone Encounter (Signed)
Please advise..... Lorazepam (ativan) #30 x 3rf  Take 1 tablet daily

## 2012-08-26 MED ORDER — LORAZEPAM 0.5 MG PO TABS: 0.5000 mg | ORAL_TABLET | Freq: Every day | ORAL | Status: DC | PRN

## 2012-08-29 LAB — PATHOLOGY

## 2012-08-30 ENCOUNTER — Ambulatory Visit: Payer: Self-pay | Admitting: General Surgery

## 2012-08-30 ENCOUNTER — Encounter: Payer: Self-pay | Admitting: General Surgery

## 2012-08-30 ENCOUNTER — Telehealth: Payer: Self-pay | Admitting: *Deleted

## 2012-08-30 DIAGNOSIS — C50919 Malignant neoplasm of unspecified site of unspecified female breast: Secondary | ICD-10-CM | POA: Diagnosis not present

## 2012-08-30 LAB — COMPREHENSIVE METABOLIC PANEL
Alkaline Phosphatase: 94 U/L (ref 50–136)
Anion Gap: 6 — ABNORMAL LOW (ref 7–16)
BUN: 10 mg/dL (ref 7–18)
Chloride: 102 mmol/L (ref 98–107)
Co2: 31 mmol/L (ref 21–32)
Creatinine: 0.75 mg/dL (ref 0.60–1.30)
Glucose: 97 mg/dL (ref 65–99)
Osmolality: 277 (ref 275–301)
SGOT(AST): 21 U/L (ref 15–37)
Sodium: 139 mmol/L (ref 136–145)
Total Protein: 7.8 g/dL (ref 6.4–8.2)

## 2012-08-30 LAB — CBC WITH DIFFERENTIAL/PLATELET
Basophil #: 0.1 10*3/uL (ref 0.0–0.1)
Basophil %: 1.1 %
Eosinophil #: 0.1 10*3/uL (ref 0.0–0.7)
Eosinophil %: 2.3 %
Lymphocyte #: 2.1 10*3/uL (ref 1.0–3.6)
Lymphocyte %: 40.4 %
MCH: 30.7 pg (ref 26.0–34.0)
MCV: 91 fL (ref 80–100)
Monocyte #: 0.5 x10 3/mm (ref 0.2–0.9)
Neutrophil #: 2.4 10*3/uL (ref 1.4–6.5)
Neutrophil %: 46.8 %
RBC: 4.45 10*6/uL (ref 3.80–5.20)
WBC: 5.1 10*3/uL (ref 3.6–11.0)

## 2012-08-30 NOTE — Telephone Encounter (Signed)
Patient called in stating Dr. Bary Castilla had contacted her with results and he would like her to have labs drawn and a chest x-ray.   This patient is to have the following labs drawn at [facility] today: CBC, Met C, CEA, and CA 27-29. She will also have a chest x-ray PA and lateral. She is aware of all instructions.  Patient has been instructed to come to the office for discussion on Sep 09, 2012.

## 2012-09-01 HISTORY — PX: BREAST LUMPECTOMY: SHX2

## 2012-09-08 ENCOUNTER — Encounter: Payer: Self-pay | Admitting: Internal Medicine

## 2012-09-09 ENCOUNTER — Ambulatory Visit (INDEPENDENT_AMBULATORY_CARE_PROVIDER_SITE_OTHER): Payer: Medicare Other | Admitting: General Surgery

## 2012-09-09 ENCOUNTER — Encounter: Payer: Self-pay | Admitting: General Surgery

## 2012-09-09 VITALS — BP 150/80 | HR 86 | Resp 16 | Ht 61.5 in | Wt 139.0 lb

## 2012-09-09 DIAGNOSIS — C50911 Malignant neoplasm of unspecified site of right female breast: Secondary | ICD-10-CM

## 2012-09-09 DIAGNOSIS — C50919 Malignant neoplasm of unspecified site of unspecified female breast: Secondary | ICD-10-CM | POA: Diagnosis not present

## 2012-09-09 NOTE — Progress Notes (Signed)
Patient ID: Kim Sanchez, female   DOB: 1941-01-04, 72 y.o.   MRN: [id number]   Patient is here today for breast discussion after her biopsy of the right breast completed August 24, 2012. The patient had been contacted by phone with the results of the biopsy on 09/03/2012. There had been a delay in reporting due to sample processing and delivery to the laboratory. She has undergone laboratory tests including a CBC, comprehensive metabolic panel CEA and CA 27-29 since her original visit. All of these studies are normal.  As I was out of town, she did meet with Tanya Nones, RN nurse navigator for the [facility] on 09/05/2012.  The patient was accompanied by her husband and sister.  The patient has not experienced any difficulties at the biopsy site.  The majority of the visit was spent reviewing the options for breast cancer treatment. Breast conservation with lumpectomy and radiation therapy  was presented as equivalent to mastectomy for long-term control. The pros and cons of each treatment regimen were reviewed. The indications for additional therapy such as chemotherapy were touched on briefly, realizing that the majority of information required to determine if chemotherapy would be of benefit is not available at this time. The availability of consultation services for medical oncology and radiation oncology prior to surgery were reviewed, although not forcefully encouraged as it would not change the management were options regarding the breast itself. An informational brochure covering the topics reviewed today was provided to the patient and her family.  Opportunities for consultation with the plastic surgery service should she desire immediate breast reconstruction was offered.  She had expressed on the phone a possible desire for second surgical opinion. She will contact the office if she would like to have this scheduled through this office and as to where she would like to be  evaluated.  The patient is on every other month Remicade for her Crohn's disease. Ideally, surgery would be completed towards the end of this dosing interval, and not having any new dose administered within 2 weeks of surgery to minimize the risk of surgical site infection. The patient reports her next dose is due on May 28, which would make anything in the week of May 12 acceptable, and certainly the dose could be pushed back a week or 2 without significant adverse affect.    One hour was spent reviewing the laboratory studies and options for management.

## 2012-09-10 ENCOUNTER — Encounter: Payer: Self-pay | Admitting: General Surgery

## 2012-09-12 ENCOUNTER — Other Ambulatory Visit: Payer: Self-pay | Admitting: General Surgery

## 2012-09-12 DIAGNOSIS — C50911 Malignant neoplasm of unspecified site of right female breast: Secondary | ICD-10-CM

## 2012-09-13 ENCOUNTER — Telehealth: Payer: Self-pay | Admitting: *Deleted

## 2012-09-13 ENCOUNTER — Ambulatory Visit: Payer: Self-pay | Admitting: General Surgery

## 2012-09-13 DIAGNOSIS — C50919 Malignant neoplasm of unspecified site of unspecified female breast: Secondary | ICD-10-CM | POA: Diagnosis not present

## 2012-09-13 DIAGNOSIS — Z01810 Encounter for preprocedural cardiovascular examination: Secondary | ICD-10-CM | POA: Diagnosis not present

## 2012-09-13 DIAGNOSIS — R002 Palpitations: Secondary | ICD-10-CM | POA: Diagnosis not present

## 2012-09-13 DIAGNOSIS — I10 Essential (primary) hypertension: Secondary | ICD-10-CM | POA: Diagnosis not present

## 2012-09-13 NOTE — Telephone Encounter (Signed)
Admissions called wanting MD to look at ECG and advise as to clearing patient for surgery. Dr. Nicki Reaper looked at ECG and is advising called Dr. Derrel Nip and at this time do not feel comfortable giving clearance for surgery notified Admissions at [facility] was informed they are currently in meeting with patient and will return call.

## 2012-09-14 ENCOUNTER — Ambulatory Visit: Payer: Self-pay | Admitting: General Surgery

## 2012-09-14 DIAGNOSIS — N63 Unspecified lump in unspecified breast: Secondary | ICD-10-CM | POA: Diagnosis not present

## 2012-09-14 DIAGNOSIS — C50919 Malignant neoplasm of unspecified site of unspecified female breast: Secondary | ICD-10-CM | POA: Diagnosis not present

## 2012-09-14 DIAGNOSIS — R011 Cardiac murmur, unspecified: Secondary | ICD-10-CM | POA: Diagnosis not present

## 2012-09-14 DIAGNOSIS — K5090 Crohn's disease, unspecified, without complications: Secondary | ICD-10-CM | POA: Diagnosis not present

## 2012-09-14 DIAGNOSIS — M779 Enthesopathy, unspecified: Secondary | ICD-10-CM | POA: Diagnosis not present

## 2012-09-14 DIAGNOSIS — Z170 Estrogen receptor positive status [ER+]: Secondary | ICD-10-CM | POA: Diagnosis not present

## 2012-09-14 DIAGNOSIS — K219 Gastro-esophageal reflux disease without esophagitis: Secondary | ICD-10-CM | POA: Diagnosis not present

## 2012-09-14 DIAGNOSIS — C50419 Malignant neoplasm of upper-outer quadrant of unspecified female breast: Secondary | ICD-10-CM | POA: Diagnosis not present

## 2012-09-14 NOTE — Telephone Encounter (Signed)
[facility] returned call today and notified that nor Dr. Derrel Nip or Dr. Nicki Reaper would give clearance with looking at ECG. Admissions stated they were going to proceed with surgery.

## 2012-09-15 ENCOUNTER — Encounter: Payer: Self-pay | Admitting: General Surgery

## 2012-09-19 ENCOUNTER — Ambulatory Visit: Payer: Medicare Other | Admitting: General Surgery

## 2012-09-20 ENCOUNTER — Encounter: Payer: Self-pay | Admitting: General Surgery

## 2012-09-20 ENCOUNTER — Other Ambulatory Visit: Payer: Self-pay | Admitting: *Deleted

## 2012-09-20 ENCOUNTER — Ambulatory Visit (INDEPENDENT_AMBULATORY_CARE_PROVIDER_SITE_OTHER): Payer: Medicare Other | Admitting: General Surgery

## 2012-09-20 VITALS — BP 164/66 | HR 80 | Resp 12 | Ht 61.0 in | Wt 136.0 lb

## 2012-09-20 DIAGNOSIS — C50919 Malignant neoplasm of unspecified site of unspecified female breast: Secondary | ICD-10-CM

## 2012-09-20 DIAGNOSIS — C50911 Malignant neoplasm of unspecified site of right female breast: Secondary | ICD-10-CM

## 2012-09-20 NOTE — Progress Notes (Signed)
Patient here today for follow up post right breast wide excision done 09-14-12. Complaints of a little "swimmy headed" but seems to be improving. Incisions healing with dermabond intact. Using tylenol for pain.   The patient underwent wide excision and mastoplasty as well as sentinel node biopsy on 09/14/2012. She's done very well. She reported 2 days postoperatively she was a little swimming and it relieved with the use of Benadryl. This may have been secondary to the LMA and blockage of the eustachian tubes. This is now improved.  The patient reports no need for narcotic analgesic.  Examination of the breast shows it to be slightly less ptotic than the left with the nipple slightly deviated laterally. The incisions are healing well. There is no evidence of lymphedema or arm swelling.  Shoulder range of motion is normal.  Ultrasound examination was completed to assess for possible MammoSite placement. This shows a small seroma cavity measuring 0.6 x 1.2 x 2.7 cm at a distance of 1.6 cm from the skin.  Impression stage I carcinoma of the breast, T1c, N0.  ER/PR assay is pending samples sent to [facility] last week.  If she is ER-positive a sample will be sent for Oncotype DX testing to determine if adjuvant chemotherapy will be of benefit.  Arrangements are in place for evaluation by radiation oncology.   She is a candidate for either partial breast radiation or whole breast radiation.

## 2012-09-20 NOTE — Patient Instructions (Addendum)
Follow up as scheduled.  

## 2012-09-20 NOTE — Progress Notes (Signed)
Patient has been scheduled for an appointment with Dr. Noreene Filbert at the [facility] for 09-22-12 at 1 am. Dr. Bary Castilla left a message for patient about this appointment.

## 2012-09-21 ENCOUNTER — Encounter: Payer: Self-pay | Admitting: General Surgery

## 2012-09-21 LAB — PATHOLOGY REPORT

## 2012-09-22 ENCOUNTER — Ambulatory Visit: Payer: Self-pay | Admitting: Radiation Oncology

## 2012-09-22 ENCOUNTER — Encounter: Payer: Self-pay | Admitting: General Surgery

## 2012-09-22 DIAGNOSIS — Z79899 Other long term (current) drug therapy: Secondary | ICD-10-CM | POA: Diagnosis not present

## 2012-09-22 DIAGNOSIS — M129 Arthropathy, unspecified: Secondary | ICD-10-CM | POA: Diagnosis not present

## 2012-09-22 DIAGNOSIS — K5090 Crohn's disease, unspecified, without complications: Secondary | ICD-10-CM | POA: Diagnosis not present

## 2012-09-22 DIAGNOSIS — K219 Gastro-esophageal reflux disease without esophagitis: Secondary | ICD-10-CM | POA: Diagnosis not present

## 2012-09-22 DIAGNOSIS — Z9889 Other specified postprocedural states: Secondary | ICD-10-CM | POA: Diagnosis not present

## 2012-09-22 DIAGNOSIS — C50919 Malignant neoplasm of unspecified site of unspecified female breast: Secondary | ICD-10-CM | POA: Diagnosis not present

## 2012-09-22 DIAGNOSIS — Z170 Estrogen receptor positive status [ER+]: Secondary | ICD-10-CM | POA: Diagnosis not present

## 2012-09-27 DIAGNOSIS — K5010 Crohn's disease of large intestine without complications: Secondary | ICD-10-CM | POA: Diagnosis not present

## 2012-09-28 ENCOUNTER — Telehealth: Payer: Self-pay | Admitting: General Surgery

## 2012-09-28 ENCOUNTER — Telehealth: Payer: Self-pay | Admitting: *Deleted

## 2012-09-28 DIAGNOSIS — C50919 Malignant neoplasm of unspecified site of unspecified female breast: Secondary | ICD-10-CM

## 2012-09-28 MED ORDER — CEFADROXIL 500 MG PO CAPS: 500.0000 mg | ORAL_CAPSULE | Freq: Two times a day (BID) | ORAL | Status: DC

## 2012-09-28 NOTE — Telephone Encounter (Signed)
Mammosite schedule reviewed with the patient Placement   June 2 8am   at [facility] Scan June 3 Treat June 4-10 [place] will be calling her for more details Aware of ATB and directions reviewed. Aware no showers and to wear her bra while mammosite in place. Pt agrees.

## 2012-09-28 NOTE — Telephone Encounter (Signed)
Patient has been evaluated by Dr. Baruch Gouty from radiation oncology and found to be an acceptable candidate for partial breast radiation. The patient had previously been informed about the procedure.  We discussed the usual step of a pre-implantation ultrasound prior to scheduling, but in light of some conflicts that would unnecessarily postpone placement, this will be completed the day of the procedure.  She is aware that if an adequate space is not identified, we will need to fall back to whole breast radiation.

## 2012-10-02 ENCOUNTER — Ambulatory Visit: Payer: Self-pay | Admitting: Radiation Oncology

## 2012-10-02 DIAGNOSIS — Z510 Encounter for antineoplastic radiation therapy: Secondary | ICD-10-CM | POA: Diagnosis not present

## 2012-10-02 DIAGNOSIS — Z170 Estrogen receptor positive status [ER+]: Secondary | ICD-10-CM | POA: Diagnosis not present

## 2012-10-02 DIAGNOSIS — C50919 Malignant neoplasm of unspecified site of unspecified female breast: Secondary | ICD-10-CM | POA: Diagnosis not present

## 2012-10-03 ENCOUNTER — Encounter: Payer: Self-pay | Admitting: General Surgery

## 2012-10-03 ENCOUNTER — Telehealth: Payer: Self-pay | Admitting: *Deleted

## 2012-10-03 ENCOUNTER — Ambulatory Visit (INDEPENDENT_AMBULATORY_CARE_PROVIDER_SITE_OTHER): Payer: Medicare Other | Admitting: General Surgery

## 2012-10-03 VITALS — BP 156/84 | HR 80 | Resp 16 | Ht 61.0 in | Wt 137.0 lb

## 2012-10-03 DIAGNOSIS — C50919 Malignant neoplasm of unspecified site of unspecified female breast: Secondary | ICD-10-CM

## 2012-10-03 NOTE — Progress Notes (Signed)
Patient ID: Kim Sanchez, female   DOB: 03/09/1941, 72 y.o.   MRN: [id number]  Chief Complaint  Patient presents with  . Other    mammosite placement    HPI Kim Sanchez is a 72 y.o. female. Here today for mammosite placement. The patient has been evaluated by radiation oncology and felt to be an acceptable candidate for partial breast radiation. The procedure had been reviewed by phone and the patient had an opportunity to review an Materials engineer. HPI  Past Medical History  Diagnosis Date  . Crohn's disease   . Arthritis   . GERD (gastroesophageal reflux disease)   . History of blood transfusion 1960  . Breast cancer, right breast 08/24/2012    Past Surgical History  Procedure Laterality Date  . Dilation and curettage of uterus    . Brain surgery  1980    breast biopsies, benign  . Breast surgery  1980's    fibro cyst both breast in [place]    Family History  Problem Relation Age of Onset  . Cancer Paternal Grandmother     Social History History  Substance Use Topics  . Smoking status: Never Smoker   . Smokeless tobacco: Never Used  . Alcohol Use: No    Allergies  Allergen Reactions  . Sulfa Antibiotics     Current Outpatient Prescriptions  Medication Sig Dispense Refill  . Biotin 10 MG CAPS Take by mouth.      . Calcium Carb-Cholecalciferol (CALCIUM + D3) 600-200 MG-UNIT TABS Take by mouth daily.       . cefadroxil (DURICEF) 500 MG capsule Take 1 capsule (500 mg total) by mouth 2 (two) times daily. Take 1 hour before procedure Monday June 2  20 capsule  0  . docusate sodium (COLACE) 100 MG capsule Take 100 mg by mouth 2 (two) times daily as needed.       . Fiber CHEW Chew by mouth as needed.      . fish oil-omega-3 fatty acids 1000 MG capsule Take 2 g by mouth daily.      Marland Kitchen ibuprofen (ADVIL,MOTRIN) 200 MG tablet Take 200 mg by mouth every 6 (six) hours as needed.      . inFLIXimab (REMICADE) 100 MG injection Inject 1 mg into the vein every 8 (eight) weeks.       Marland Kitchen LORazepam (ATIVAN) 0.5 MG tablet Take 1 tablet (0.5 mg total) by mouth daily as needed.  30 tablet  3  . Multiple Vitamin (MULTIVITAMIN) tablet Take 1 tablet by mouth daily.      . polyethylene glycol (MIRALAX / GLYCOLAX) packet Take 17 g by mouth daily as needed.       . Probiotic Product (PROBIOTIC DAILY PO) Take by mouth.      . Red Yeast Rice 600 MG CAPS Take by mouth daily.        No current facility-administered medications for this visit.    Review of Systems Review of Systems  Constitutional: Negative.   Respiratory: Negative.   Cardiovascular: Negative.     Blood pressure 156/84, pulse 80, resp. rate 16, height 5' 1"  (1.549 m), weight 137 lb (62.143 kg).  Physical Exam Physical Exam The breast is healing well. No evidence of induration or redness. Good shoulder range of motion.  Data Reviewed Ultrasound examination of the breast shows a small cavity measuring 0.8 x 1.4 x 3.3 cm at a distance 1.3 cm from the skin.  10 cc of 0.5% Xylocaine with 0.25% Marcaine with 1  200,000 units of epinephrine was utilized and well tolerated. The seroma cavity was aspirated of a small quantity of straw-colored fluid. The breast was prepped with ChloraPrep and draped. The 8 mm trocar was inserted into the cavity an additional 3 or 4 cc of fluid obtained. A cavity evaluation device was inflated to 35 mm. While the area below the incision was fine and about 3 cm superior the balloon came within 0.68 cm of the skin. This was an inadequate cover to prevent skin burn during partial breast radiation. The skin defect was closed with a 4-0 Vicryl subcuticular suture followed by Steri-Strips, Telfa and Tegaderm. She tolerated the procedure well. She was advised she can remove the dressing in 2 days and a small amount drainage is normal.     Assessment    Right breast cancer, inadequate spacing for partial breast radiation.    Plan    I spoke with radiation oncologist. Arrangements have been  made for an appointment at his office tomorrow morning at 9 AM for simulation.  We'll plan for an office visit here in 3 months after she is completed her radiation therapy.       [name] 10/03/2012, 8:50 AM

## 2012-10-03 NOTE — Telephone Encounter (Signed)
Patient called was confused about if she should continue with antibiotic? She said she did not get the mammosite placed, but wasn't for sure.  I let the patient know that I checked with our nurse Vaughan Basta  and let the patient know it wasn't necessary. She wanted me to ask you just to confer. Thank you

## 2012-10-04 DIAGNOSIS — C50919 Malignant neoplasm of unspecified site of unspecified female breast: Secondary | ICD-10-CM | POA: Diagnosis not present

## 2012-10-06 DIAGNOSIS — C50919 Malignant neoplasm of unspecified site of unspecified female breast: Secondary | ICD-10-CM | POA: Diagnosis not present

## 2012-10-11 DIAGNOSIS — C50919 Malignant neoplasm of unspecified site of unspecified female breast: Secondary | ICD-10-CM | POA: Diagnosis not present

## 2012-10-12 DIAGNOSIS — C50919 Malignant neoplasm of unspecified site of unspecified female breast: Secondary | ICD-10-CM | POA: Diagnosis not present

## 2012-10-18 DIAGNOSIS — C50919 Malignant neoplasm of unspecified site of unspecified female breast: Secondary | ICD-10-CM | POA: Diagnosis not present

## 2012-10-20 LAB — CBC CANCER CENTER
Basophil %: 1.1 %
Eosinophil #: 0.2 x10 3/mm (ref 0.0–0.7)
HCT: 40.6 % (ref 35.0–47.0)
Lymphocyte %: 38.6 %
MCHC: 34.6 g/dL (ref 32.0–36.0)
Monocyte #: 0.6 x10 3/mm (ref 0.2–0.9)
Neutrophil #: 2.1 x10 3/mm (ref 1.4–6.5)
Neutrophil %: 44.2 %
RBC: 4.5 10*6/uL (ref 3.80–5.20)
RDW: 13.4 % (ref 11.5–14.5)

## 2012-10-25 ENCOUNTER — Ambulatory Visit (INDEPENDENT_AMBULATORY_CARE_PROVIDER_SITE_OTHER): Payer: Medicare Other | Admitting: General Surgery

## 2012-10-25 ENCOUNTER — Encounter: Payer: Self-pay | Admitting: General Surgery

## 2012-10-25 VITALS — BP 154/80 | HR 74 | Resp 14 | Ht 61.0 in | Wt 137.0 lb

## 2012-10-25 DIAGNOSIS — C50919 Malignant neoplasm of unspecified site of unspecified female breast: Secondary | ICD-10-CM

## 2012-10-25 DIAGNOSIS — C50911 Malignant neoplasm of unspecified site of right female breast: Secondary | ICD-10-CM

## 2012-10-25 NOTE — Progress Notes (Signed)
Patient ID: Kim Sanchez, female   DOB: 30-Sep-1940, 72 y.o.   MRN: [id number]  Chief Complaint  Patient presents with  . Other    breast cancer    HPI Kim Sanchez is a 72 y.o. female here today for one month breast cancer. She had an right breast wide excision on 09/04/12. Patient is still doing radiation, today was the tenth treatment. HPI  Past Medical History  Diagnosis Date  . Crohn's disease   . Arthritis   . GERD (gastroesophageal reflux disease)   . History of blood transfusion 1960  . Breast cancer, right breast 08/24/2012    Past Surgical History  Procedure Laterality Date  . Dilation and curettage of uterus    . Brain surgery  1980    breast biopsies, benign  . Breast surgery  1980's    fibro cyst both breast in [place]    Family History  Problem Relation Age of Onset  . Cancer Paternal Grandmother     Social History History  Substance Use Topics  . Smoking status: Never Smoker   . Smokeless tobacco: Never Used  . Alcohol Use: No    Allergies  Allergen Reactions  . Sulfa Antibiotics     Current Outpatient Prescriptions  Medication Sig Dispense Refill  . Biotin 10 MG CAPS Take by mouth.      . Calcium Carb-Cholecalciferol (CALCIUM + D3) 600-200 MG-UNIT TABS Take by mouth daily.       . cefadroxil (DURICEF) 500 MG capsule Take 1 capsule (500 mg total) by mouth 2 (two) times daily. Take 1 hour before procedure Monday June 2  20 capsule  0  . docusate sodium (COLACE) 100 MG capsule Take 100 mg by mouth 2 (two) times daily as needed.       . Fiber CHEW Chew by mouth as needed.      . fish oil-omega-3 fatty acids 1000 MG capsule Take 2 g by mouth daily.      Marland Kitchen ibuprofen (ADVIL,MOTRIN) 200 MG tablet Take 200 mg by mouth every 6 (six) hours as needed.      . inFLIXimab (REMICADE) 100 MG injection Inject 1 mg into the vein every 8 (eight) weeks.      Marland Kitchen LORazepam (ATIVAN) 0.5 MG tablet Take 1 tablet (0.5 mg total) by mouth daily as needed.  30 tablet  3  . Multiple  Vitamin (MULTIVITAMIN) tablet Take 1 tablet by mouth daily.      . polyethylene glycol (MIRALAX / GLYCOLAX) packet Take 17 g by mouth daily as needed.       . Probiotic Product (PROBIOTIC DAILY PO) Take by mouth.      . Red Yeast Rice 600 MG CAPS Take by mouth daily.        No current facility-administered medications for this visit.    Review of Systems Review of Systems  Constitutional: Negative.   Respiratory: Negative.   Cardiovascular: Negative.     Blood pressure 154/80, pulse 74, resp. rate 14, height 5' 1"  (1.549 m), weight 137 lb (62.143 kg).  Physical Exam Physical Exam  Constitutional: She is oriented to person, place, and time. She appears well-developed and well-nourished.  Pulmonary/Chest: Right breast exhibits no inverted nipple, no mass, no nipple discharge, no skin change and no tenderness.  Right axilla thickening  Lymphadenopathy:    She has no cervical adenopathy.    She has no axillary adenopathy.  Neurological: She is alert and oriented to person, place, and time.  Skin:  Skin is warm and dry.    Data Reviewed T1c,N0; ER/PR +.   Assessment    Doing well status post wide excision, sentinel node biopsy and initiation of breast radiation.      Plan     In light of the T1 C. status, options for primary medical oncology evaluation versus Oncotype DX testing and referral if at high risk was discussed. Patient amenable to Oncotype DX testing and referral if anything beyond anti-estrogen therapy is required.         [name] 10/25/2012, 10:09 AM

## 2012-10-25 NOTE — Patient Instructions (Signed)
Patient to return in August 2014.

## 2012-10-27 DIAGNOSIS — C50919 Malignant neoplasm of unspecified site of unspecified female breast: Secondary | ICD-10-CM | POA: Diagnosis not present

## 2012-10-27 LAB — CBC CANCER CENTER
Basophil %: 1 %
HGB: 14 g/dL (ref 12.0–16.0)
Lymphocyte %: 37.8 %
MCV: 90 fL (ref 80–100)
Neutrophil %: 45.2 %
Platelet: 210 x10 3/mm (ref 150–440)
RBC: 4.52 10*6/uL (ref 3.80–5.20)
WBC: 4.6 x10 3/mm (ref 3.6–11.0)

## 2012-11-01 ENCOUNTER — Ambulatory Visit: Payer: Self-pay | Admitting: Radiation Oncology

## 2012-11-01 DIAGNOSIS — Z170 Estrogen receptor positive status [ER+]: Secondary | ICD-10-CM | POA: Diagnosis not present

## 2012-11-01 DIAGNOSIS — C50919 Malignant neoplasm of unspecified site of unspecified female breast: Secondary | ICD-10-CM | POA: Diagnosis not present

## 2012-11-01 DIAGNOSIS — Z510 Encounter for antineoplastic radiation therapy: Secondary | ICD-10-CM | POA: Diagnosis not present

## 2012-11-03 LAB — CBC CANCER CENTER
Basophil %: 0.8 %
Eosinophil #: 0.2 x10 3/mm (ref 0.0–0.7)
HCT: 40 % (ref 35.0–47.0)
HGB: 13.9 g/dL (ref 12.0–16.0)
Lymphocyte %: 33 %
MCH: 31.3 pg (ref 26.0–34.0)
MCHC: 34.7 g/dL (ref 32.0–36.0)
MCV: 90 fL (ref 80–100)
Monocyte #: 0.5 x10 3/mm (ref 0.2–0.9)
Neutrophil %: 49.4 %
Platelet: 204 x10 3/mm (ref 150–440)
RDW: 13.2 % (ref 11.5–14.5)
WBC: 4.1 x10 3/mm (ref 3.6–11.0)

## 2012-11-08 DIAGNOSIS — C50919 Malignant neoplasm of unspecified site of unspecified female breast: Secondary | ICD-10-CM | POA: Diagnosis not present

## 2012-11-10 LAB — CBC CANCER CENTER
Basophil #: 0 x10 3/mm (ref 0.0–0.1)
Eosinophil #: 0.3 x10 3/mm (ref 0.0–0.7)
HCT: 39.5 % (ref 35.0–47.0)
Lymphocyte #: 1.4 x10 3/mm (ref 1.0–3.6)
MCV: 90 fL (ref 80–100)
Monocyte %: 13 %
Neutrophil %: 48.2 %
Platelet: 197 x10 3/mm (ref 150–440)
RBC: 4.37 10*6/uL (ref 3.80–5.20)

## 2012-11-11 ENCOUNTER — Telehealth: Payer: Self-pay | Admitting: General Surgery

## 2012-11-11 NOTE — Telephone Encounter (Signed)
Message left to call for results of Oncotype DX testing.  Very low recurrence score. No indication for chemotherapy at this time.  Anti-estrogen therapy indicated. Will offer Femara at post radiation f/u visit.

## 2012-11-14 ENCOUNTER — Telehealth: Payer: Self-pay | Admitting: General Surgery

## 2012-11-14 DIAGNOSIS — C50919 Malignant neoplasm of unspecified site of unspecified female breast: Secondary | ICD-10-CM | POA: Diagnosis not present

## 2012-11-14 NOTE — Telephone Encounter (Signed)
PATIENT CAME IN OFFICE TODAY. STATED YOU HAD CALLED HER OVER THE WEEKEND. SHE WAS GIVEN THE OPTION TO WAIT TO SEE IF YOU WHERE ABLE . SHE ASK THAT YOU CALL HER ON HER CELL #.

## 2012-11-15 DIAGNOSIS — C50919 Malignant neoplasm of unspecified site of unspecified female breast: Secondary | ICD-10-CM | POA: Diagnosis not present

## 2012-11-15 NOTE — Telephone Encounter (Signed)
Notified low risk for recurrence with anti-estrogen therapy alone. Will discuss AI therapy at time of August appt (after Radiation is completed.)

## 2012-11-16 ENCOUNTER — Encounter: Payer: Self-pay | Admitting: Internal Medicine

## 2012-11-17 LAB — CBC CANCER CENTER
Basophil #: 0 x10 3/mm (ref 0.0–0.1)
Basophil %: 0.7 %
Eosinophil #: 0.3 x10 3/mm (ref 0.0–0.7)
Eosinophil %: 6.7 %
HCT: 39.7 % (ref 35.0–47.0)
HGB: 13.4 g/dL (ref 12.0–16.0)
Lymphocyte #: 1.2 x10 3/mm (ref 1.0–3.6)
MCH: 30.5 pg (ref 26.0–34.0)
MCHC: 33.8 g/dL (ref 32.0–36.0)
Monocyte #: 0.5 x10 3/mm (ref 0.2–0.9)
Neutrophil #: 2.1 x10 3/mm (ref 1.4–6.5)
Platelet: 198 x10 3/mm (ref 150–440)
RBC: 4.4 10*6/uL (ref 3.80–5.20)
RDW: 13.5 % (ref 11.5–14.5)
WBC: 4.1 x10 3/mm (ref 3.6–11.0)

## 2012-11-22 ENCOUNTER — Encounter: Payer: Self-pay | Admitting: General Surgery

## 2012-11-22 DIAGNOSIS — C50919 Malignant neoplasm of unspecified site of unspecified female breast: Secondary | ICD-10-CM | POA: Diagnosis not present

## 2012-11-24 LAB — CBC CANCER CENTER
Basophil %: 1 %
Eosinophil #: 0.3 x10 3/mm (ref 0.0–0.7)
Eosinophil %: 6.7 %
HCT: 41.4 % (ref 35.0–47.0)
Lymphocyte #: 1.4 x10 3/mm (ref 1.0–3.6)
MCH: 31.3 pg (ref 26.0–34.0)
MCHC: 34.5 g/dL (ref 32.0–36.0)
Monocyte #: 0.6 x10 3/mm (ref 0.2–0.9)
Monocyte %: 12.6 %
RDW: 13.8 % (ref 11.5–14.5)
WBC: 5.1 x10 3/mm (ref 3.6–11.0)

## 2012-11-29 DIAGNOSIS — C50919 Malignant neoplasm of unspecified site of unspecified female breast: Secondary | ICD-10-CM | POA: Diagnosis not present

## 2012-12-02 ENCOUNTER — Ambulatory Visit: Payer: Self-pay | Admitting: Radiation Oncology

## 2012-12-02 DIAGNOSIS — C50919 Malignant neoplasm of unspecified site of unspecified female breast: Secondary | ICD-10-CM | POA: Diagnosis not present

## 2012-12-02 DIAGNOSIS — Z803 Family history of malignant neoplasm of breast: Secondary | ICD-10-CM | POA: Diagnosis not present

## 2012-12-02 DIAGNOSIS — IMO0002 Reserved for concepts with insufficient information to code with codable children: Secondary | ICD-10-CM | POA: Diagnosis not present

## 2012-12-02 DIAGNOSIS — K219 Gastro-esophageal reflux disease without esophagitis: Secondary | ICD-10-CM | POA: Diagnosis not present

## 2012-12-02 DIAGNOSIS — Z923 Personal history of irradiation: Secondary | ICD-10-CM | POA: Diagnosis not present

## 2012-12-02 DIAGNOSIS — Z79811 Long term (current) use of aromatase inhibitors: Secondary | ICD-10-CM | POA: Diagnosis not present

## 2012-12-02 DIAGNOSIS — M129 Arthropathy, unspecified: Secondary | ICD-10-CM | POA: Diagnosis not present

## 2012-12-02 DIAGNOSIS — Z79899 Other long term (current) drug therapy: Secondary | ICD-10-CM | POA: Diagnosis not present

## 2012-12-02 DIAGNOSIS — Z801 Family history of malignant neoplasm of trachea, bronchus and lung: Secondary | ICD-10-CM | POA: Diagnosis not present

## 2012-12-02 DIAGNOSIS — Z170 Estrogen receptor positive status [ER+]: Secondary | ICD-10-CM | POA: Diagnosis not present

## 2012-12-02 DIAGNOSIS — Z800 Family history of malignant neoplasm of digestive organs: Secondary | ICD-10-CM | POA: Diagnosis not present

## 2012-12-02 DIAGNOSIS — R110 Nausea: Secondary | ICD-10-CM | POA: Diagnosis not present

## 2012-12-05 ENCOUNTER — Ambulatory Visit (INDEPENDENT_AMBULATORY_CARE_PROVIDER_SITE_OTHER): Payer: Medicare Other | Admitting: General Surgery

## 2012-12-05 ENCOUNTER — Encounter: Payer: Self-pay | Admitting: General Surgery

## 2012-12-05 VITALS — BP 150/78 | HR 80 | Resp 12 | Ht 61.5 in | Wt 137.0 lb

## 2012-12-05 DIAGNOSIS — C50919 Malignant neoplasm of unspecified site of unspecified female breast: Secondary | ICD-10-CM

## 2012-12-05 DIAGNOSIS — C50911 Malignant neoplasm of unspecified site of right female breast: Secondary | ICD-10-CM

## 2012-12-05 MED ORDER — EXEMESTANE 25 MG PO TABS
25.0000 mg | ORAL_TABLET | Freq: Every day | ORAL | Status: DC
Start: 2012-12-05 — End: 2013-03-08

## 2012-12-05 NOTE — Patient Instructions (Addendum)
Patient to return in December 2014 with bilateral diagnostic mammogram.

## 2012-12-05 NOTE — Progress Notes (Signed)
Patient ID: Kim Sanchez, female   DOB: [birth year], 72 y.o.   MRN: [id number]  Chief Complaint  Patient presents with  . Breast Cancer Long Term Follow Up    2 month follow up     HPI Kim Sanchez is a 72 y.o. female who presents for a 2 month follow up for right breast cancer. The patient just completed radiation treatment. She is doing good overall but is having some tiredness which she expected.    HPI  Past Medical History  Diagnosis Date  . Crohn's disease   . Arthritis   . GERD (gastroesophageal reflux disease)   . History of blood transfusion 1960  . Breast cancer, right breast 08/24/2012    Past Surgical History  Procedure Laterality Date  . Dilation and curettage of uterus    . Brain surgery  1980    breast biopsies, benign  . Breast surgery  1980's    fibro cyst both breast in [place]    Family History  Problem Relation Age of Onset  . Cancer Paternal Grandmother     Social History History  Substance Use Topics  . Smoking status: Never Smoker   . Smokeless tobacco: Never Used  . Alcohol Use: No    Allergies  Allergen Reactions  . Sulfa Antibiotics     Current Outpatient Prescriptions  Medication Sig Dispense Refill  . aspirin 81 MG tablet Take 81 mg by mouth daily.      . Biotin 10 MG CAPS Take by mouth.      . Calcium Carb-Cholecalciferol (CALCIUM + D3) 600-200 MG-UNIT TABS Take by mouth daily.       Marland Kitchen docusate sodium (COLACE) 100 MG capsule Take 100 mg by mouth 2 (two) times daily as needed.       . Fiber CHEW Chew by mouth as needed.      . fish oil-omega-3 fatty acids 1000 MG capsule Take 2 g by mouth daily.      Marland Kitchen ibuprofen (ADVIL,MOTRIN) 200 MG tablet Take 200 mg by mouth every 6 (six) hours as needed.      . inFLIXimab (REMICADE) 100 MG injection Inject 1 mg into the vein every 8 (eight) weeks.      Marland Kitchen LORazepam (ATIVAN) 0.5 MG tablet Take 1 tablet (0.5 mg total) by mouth daily as needed.  30 tablet  3  . Multiple Vitamin (MULTIVITAMIN) tablet  Take 1 tablet by mouth daily.      . polyethylene glycol (MIRALAX / GLYCOLAX) packet Take 17 g by mouth daily as needed.       . Probiotic Product (PROBIOTIC DAILY PO) Take by mouth.      . Red Yeast Rice 600 MG CAPS Take by mouth daily.        No current facility-administered medications for this visit.    Review of Systems Review of Systems  Constitutional: Negative.   Respiratory: Negative.   Cardiovascular: Negative.     Blood pressure 150/78, pulse 80, resp. rate 12, height 5' 1.5" (1.562 m), weight 137 lb (62.143 kg).  Physical Exam Physical Exam  Constitutional: She is oriented to person, place, and time. She appears well-developed and well-nourished.  Pulmonary/Chest:  4 x 8 cm area of erythemia at the boost site. Little thickening in the right axilla along the scar.   Neurological: She is alert and oriented to person, place, and time.  Skin: Skin is warm and dry.    Data Reviewed ER/PR positive tumor, T1c, N0  Assessment    Doing well status post wide excision in all breast radiation.  Candidate for aromatase inhibitor.    Plan    Review of the literature suggests no contraindication to the initiation of Aromasin, 25 mg daily. (Femara not covered by her insurance).The need for biannual bone density testing was reviewed.  The patient is planning a trip to [place], [place] to see a new grandchild later this month. She's going to defer initiation of Femara therapy until her return.  Screening mammograms are due for September, these be postponed until December 2014 so that she is not alternating breast for followup mammograms.  The opportunity for formal medical oncology consultation was offered. At this time, the patient does not feel this is necessary for her comfort.       Kim Sanchez 12/05/2012, 8:42 PM

## 2012-12-07 ENCOUNTER — Other Ambulatory Visit: Payer: Self-pay

## 2012-12-07 DIAGNOSIS — K5090 Crohn's disease, unspecified, without complications: Secondary | ICD-10-CM | POA: Diagnosis not present

## 2012-12-08 ENCOUNTER — Telehealth: Payer: Self-pay | Admitting: General Surgery

## 2012-12-21 DIAGNOSIS — H2510 Age-related nuclear cataract, unspecified eye: Secondary | ICD-10-CM | POA: Diagnosis not present

## 2012-12-22 NOTE — Telephone Encounter (Signed)
error 

## 2012-12-23 DIAGNOSIS — Z800 Family history of malignant neoplasm of digestive organs: Secondary | ICD-10-CM | POA: Diagnosis not present

## 2012-12-23 DIAGNOSIS — IMO0002 Reserved for concepts with insufficient information to code with codable children: Secondary | ICD-10-CM | POA: Diagnosis not present

## 2012-12-23 DIAGNOSIS — Z803 Family history of malignant neoplasm of breast: Secondary | ICD-10-CM | POA: Diagnosis not present

## 2012-12-23 DIAGNOSIS — C50919 Malignant neoplasm of unspecified site of unspecified female breast: Secondary | ICD-10-CM | POA: Diagnosis not present

## 2012-12-29 DIAGNOSIS — D235 Other benign neoplasm of skin of trunk: Secondary | ICD-10-CM | POA: Diagnosis not present

## 2012-12-29 DIAGNOSIS — L821 Other seborrheic keratosis: Secondary | ICD-10-CM | POA: Diagnosis not present

## 2012-12-30 DIAGNOSIS — Z923 Personal history of irradiation: Secondary | ICD-10-CM | POA: Diagnosis not present

## 2012-12-30 DIAGNOSIS — Z170 Estrogen receptor positive status [ER+]: Secondary | ICD-10-CM | POA: Diagnosis not present

## 2012-12-30 DIAGNOSIS — C50919 Malignant neoplasm of unspecified site of unspecified female breast: Secondary | ICD-10-CM | POA: Diagnosis not present

## 2013-01-02 ENCOUNTER — Ambulatory Visit: Payer: Self-pay | Admitting: Radiation Oncology

## 2013-01-04 ENCOUNTER — Other Ambulatory Visit: Payer: Self-pay | Admitting: Internal Medicine

## 2013-01-04 NOTE — Telephone Encounter (Signed)
Last OV 1/14. Has appt scheduled 01/16/13

## 2013-01-05 NOTE — Telephone Encounter (Signed)
Rx faxed to pharmacy  

## 2013-01-10 LAB — HM PAP SMEAR: HM Pap smear: NORMAL

## 2013-01-13 ENCOUNTER — Encounter: Payer: Self-pay | Admitting: *Deleted

## 2013-01-16 ENCOUNTER — Encounter: Payer: Self-pay | Admitting: Internal Medicine

## 2013-01-16 ENCOUNTER — Telehealth: Payer: Self-pay | Admitting: Internal Medicine

## 2013-01-16 ENCOUNTER — Other Ambulatory Visit (HOSPITAL_COMMUNITY)
Admission: RE | Admit: 2013-01-16 | Discharge: 2013-01-16 | Disposition: A | Discharge status: Home / Self Care | Payer: Medicare Other | Source: Ambulatory Visit | Attending: Internal Medicine | Admitting: Internal Medicine

## 2013-01-16 ENCOUNTER — Ambulatory Visit (INDEPENDENT_AMBULATORY_CARE_PROVIDER_SITE_OTHER): Payer: Medicare Other | Admitting: Internal Medicine

## 2013-01-16 VITALS — BP 148/84 | HR 91 | Temp 98.5°F | Resp 14 | Ht 62.0 in | Wt 138.5 lb

## 2013-01-16 DIAGNOSIS — N390 Urinary tract infection, site not specified: Secondary | ICD-10-CM | POA: Diagnosis not present

## 2013-01-16 DIAGNOSIS — Z79899 Other long term (current) drug therapy: Secondary | ICD-10-CM | POA: Diagnosis not present

## 2013-01-16 DIAGNOSIS — G4700 Insomnia, unspecified: Secondary | ICD-10-CM | POA: Diagnosis not present

## 2013-01-16 DIAGNOSIS — K5090 Crohn's disease, unspecified, without complications: Secondary | ICD-10-CM

## 2013-01-16 DIAGNOSIS — Z01419 Encounter for gynecological examination (general) (routine) without abnormal findings: Secondary | ICD-10-CM | POA: Diagnosis not present

## 2013-01-16 DIAGNOSIS — E559 Vitamin D deficiency, unspecified: Secondary | ICD-10-CM

## 2013-01-16 DIAGNOSIS — E785 Hyperlipidemia, unspecified: Secondary | ICD-10-CM

## 2013-01-16 DIAGNOSIS — C50911 Malignant neoplasm of unspecified site of right female breast: Secondary | ICD-10-CM

## 2013-01-16 DIAGNOSIS — Z0000 Encounter for general adult medical examination without abnormal findings: Secondary | ICD-10-CM | POA: Diagnosis not present

## 2013-01-16 DIAGNOSIS — Z1151 Encounter for screening for human papillomavirus (HPV): Secondary | ICD-10-CM | POA: Diagnosis not present

## 2013-01-16 DIAGNOSIS — C50919 Malignant neoplasm of unspecified site of unspecified female breast: Secondary | ICD-10-CM | POA: Diagnosis not present

## 2013-01-16 DIAGNOSIS — R5381 Other malaise: Secondary | ICD-10-CM | POA: Diagnosis not present

## 2013-01-16 DIAGNOSIS — Z1211 Encounter for screening for malignant neoplasm of colon: Secondary | ICD-10-CM

## 2013-01-16 LAB — CBC WITH DIFFERENTIAL/PLATELET
Basophils Absolute: 0 10*3/uL (ref 0.0–0.1)
Eosinophils Absolute: 0.1 10*3/uL (ref 0.0–0.7)
Eosinophils Relative: 3 % (ref 0.0–5.0)
Lymphs Abs: 1.3 10*3/uL (ref 0.7–4.0)
MCHC: 33.7 g/dL (ref 30.0–36.0)
MCV: 90.5 fl (ref 78.0–100.0)
Monocytes Absolute: 0.4 10*3/uL (ref 0.1–1.0)
Neutrophils Relative %: 58.2 % (ref 43.0–77.0)
Platelets: 213 10*3/uL (ref 150.0–400.0)
RDW: 13.9 % (ref 11.5–14.6)
WBC: 4.7 10*3/uL (ref 4.5–10.5)

## 2013-01-16 LAB — COMPREHENSIVE METABOLIC PANEL
ALT: 18 U/L (ref 0–35)
Alkaline Phosphatase: 65 U/L (ref 39–117)
CO2: 28 mEq/L (ref 19–32)
Creatinine, Ser: 0.7 mg/dL (ref 0.4–1.2)
GFR: 87.49 mL/min (ref >60.00)
Sodium: 140 mEq/L (ref 135–145)
Total Bilirubin: 1 mg/dL (ref 0.3–1.2)
Total Protein: 7.5 g/dL (ref 6.0–8.3)

## 2013-01-16 LAB — POCT URINALYSIS DIPSTICK
Ketones, UA: NEGATIVE
Protein, UA: NEGATIVE
Urobilinogen, UA: 0.2
pH, UA: 6

## 2013-01-16 LAB — LIPID PANEL: Cholesterol: 252 mg/dL — ABNORMAL HIGH (ref 0–200)

## 2013-01-16 LAB — TSH: TSH: 2.19 u[IU]/mL (ref 0.35–5.50)

## 2013-01-16 MED ORDER — LORAZEPAM 0.5 MG PO TABS: ORAL_TABLET | ORAL | Status: DC

## 2013-01-16 MED ORDER — TETANUS-DIPHTH-ACELL PERTUSSIS 5-2.5-18.5 LF-MCG/0.5 IM SUSP: 0.5000 mL | Freq: Once | INTRAMUSCULAR | Status: DC

## 2013-01-16 NOTE — Assessment & Plan Note (Addendum)
Her follow up [facility] GI in [place] is frequent due to history of Crohns disease

## 2013-01-16 NOTE — Assessment & Plan Note (Signed)
Managed with immunosuppressive therapy by [facility].

## 2013-01-16 NOTE — Assessment & Plan Note (Addendum)
S/p lumpectomy and XRT completed . Aromasin started.  managed by Dr. Bary Castilla an dDr. Grayland Ormond. Switching to Femara today

## 2013-01-16 NOTE — Progress Notes (Signed)
Patient ID: Kim Sanchez, female   DOB: 06/18/1940, 72 y.o.   MRN: [id number]   The patient is here for annual Medicare wellness examination and management of other chronic and acute problems. In the interim she was diagnosed with breast cancer after finding a lump in her right breast.  She underwent lumpectomy and XRT, and is now taking Aromasin for adjuvant therapy.  She has had some mild fatigue associated with treatment , but denies dyspnea, weight gain, and fluid retention   The risk factors are reflected in the social history.  The roster of all physicians providing medical care to patient - is listed in the Snapshot section of the chart.  Activities of daily living:  The patient is 100% independent in all ADLs: dressing, toileting, feeding as well as independent mobility  Home safety : The patient has smoke detectors in the home. They wear seatbelts.  There are no firearms at home. There is no violence in the home.   There is no risks for hepatitis, STDs or HIV. There is no   history of blood transfusion. They have no travel history to infectious disease endemic areas of the world.  The patient is schecduled for her six month dental check up They have seen their eye doctor in the last year. They admit to slight hearing difficulty with regard to whispered voices and some television programs.  They have deferred audiologic testing in the last year.  They do not  have excessive sun exposure. Discussed the need for sun protection: hats, long sleeves and use of sunscreen if there is significant sun exposure.   Diet: the importance of a healthy diet is discussed. They do have a healthy diet.  The benefits of regular aerobic exercise were discussed. She walks 4 times per week ,  20 minutes.   Depression screen: there are no signs or vegative symptoms of depression- irritability, change in appetite, anhedonia, sadness/tearfullness.  Cognitive assessment: the patient manages all their financial and  personal affairs and is actively engaged. They could relate day,date,year and events; recalled 2/3 objects at 3 minutes; performed clock-face test normally.  The following portions of the patient's history were reviewed and updated as appropriate: allergies, current medications, past family history, past medical history,  past surgical history, past social history  and problem list.  Visual acuity was not assessed per patient preference since she has regular follow up with her ophthalmologist. Hearing and body mass index were assessed and reviewed.   During the course of the visit the patient was educated and counseled about appropriate screening and preventive services including : fall prevention , diabetes screening, nutrition counseling, colorectal cancer screening, and recommended immunizations.    Objective:  BP 148/84  Pulse 91  Temp(Src) 98.5 F (36.9 C) (Oral)  Resp 14  Ht 5' 2"  (1.575 m)  Wt 138 lb 8 oz (62.823 kg)  BMI 25.33 kg/m2  SpO2 97%  General Appearance:    Alert, cooperative, no distress, appears stated age  Head:    Normocephalic, without obvious abnormality, atraumatic  Eyes:    PERRL, conjunctiva/corneas clear, EOM's intact, fundi    benign, both eyes  Ears:    Normal TM's and external ear canals, both ears  Nose:   Nares normal, septum midline, mucosa normal, no drainage    or sinus tenderness  Throat:   Lips, mucosa, and tongue normal; teeth and gums normal  Neck:   Supple, symmetrical, trachea midline, no adenopathy;    thyroid:  no  enlargement/tenderness/nodules; no carotid   bruit or JVD  Back:     Symmetric, no curvature, ROM normal, no CVA tenderness  Lungs:     Clear to auscultation bilaterally, respirations unlabored  Chest Wall:    No tenderness or deformity   Heart:    Regular rate and rhythm, S1 and S2 normal, no murmur, rub   or gallop  Breast Exam:    deferred  Abdomen:     Soft, non-tender, bowel sounds active all four quadrants,    no masses,  no organomegaly  Genitalia:    Pelvic: cervix normal in appearance, external genitalia normal, no adnexal masses or tenderness, no cervical motion tenderness, rectovaginal septum normal, uterus normal size, shape, and consistency and vagina normal without discharge  Extremities:   Extremities normal, atraumatic, no cyanosis or edema  Pulses:   2+ and symmetric all extremities  Skin:   Skin color, texture, turgor normal, no rashes or lesions  Lymph nodes:   Cervical, supraclavicular, and axillary nodes normal  Neurologic:   CNII-XII intact, normal strength, sensation and reflexes    throughout   Assessment and Plan:   Breast cancer, right breast S/p lumpectomy and XRT completed . Aromasin started.  managed by Dr. Bary Castilla an dDr. Grayland Ormond. Switching to Femara today   Routine general medical examination at a health care facility Annual comprehensive exam was done including pelvic and PAP smear. All screenings have been addressed .   Screening for colon cancer Her follow up vy GI in [place] is frequent due to history of Crohns disease  Hyperlipidemia Managed with red yeast rice given her history of statin intolerance. Although her LDL is 150 today her HDL is 90. She has no history of diabetes hypertension or early family history of coronary artery disease. No changes today    Crohn's disease Managed with immunosuppressive therapy by [facility].    Updated Medication List Outpatient Encounter Prescriptions as of 01/16/2013  Medication Sig Dispense Refill  . aspirin 81 MG tablet Take 81 mg by mouth daily.      . Biotin 10 MG CAPS Take by mouth.      . Calcium Carb-Cholecalciferol (CALCIUM + D3) 600-200 MG-UNIT TABS Take by mouth daily.       . Cholecalciferol (VITAMIN D3) 2000 UNITS TABS Take 1 tablet by mouth daily.      Marland Kitchen exemestane (AROMASIN) 25 MG tablet Take 1 tablet (25 mg total) by mouth daily after breakfast.  30 tablet  11  . Fiber CHEW Chew by mouth as needed.       . fish oil-omega-3 fatty acids 1000 MG capsule Take 2 g by mouth daily.      Marland Kitchen ibuprofen (ADVIL,MOTRIN) 200 MG tablet Take 200 mg by mouth every 6 (six) hours as needed.      . inFLIXimab (REMICADE) 100 MG injection Inject 1 mg into the vein every 8 (eight) weeks.      Marland Kitchen LORazepam (ATIVAN) 0.5 MG tablet TAKE 1 TABLET BY MOUTH EVERY DAY AS NEEDED  30 tablet  5  . Multiple Vitamin (MULTIVITAMIN) tablet Take 1 tablet by mouth daily.      . polyethylene glycol (MIRALAX / GLYCOLAX) packet Take 17 g by mouth daily as needed.       . Probiotic Product (PROBIOTIC DAILY PO) Take by mouth.      . Red Yeast Rice 600 MG CAPS Take by mouth daily.       . vitamin E 400 UNIT capsule Take  400 Units by mouth daily.      . [DISCONTINUED] LORazepam (ATIVAN) 0.5 MG tablet TAKE 1 TABLET BY MOUTH EVERY DAY AS NEEDED  30 tablet  0  . docusate sodium (COLACE) 100 MG capsule Take 100 mg by mouth 2 (two) times daily as needed.       . TDaP (BOOSTRIX) 5-2.5-18.5 LF-MCG/0.5 injection Inject 0.5 mLs into the muscle once.  0.5 mL  0   No facility-administered encounter medications on file as of 01/16/2013.

## 2013-01-16 NOTE — Assessment & Plan Note (Signed)
Changingfrom Aromasin to Femara for significant cost savings.  Oncology  to call in new rx.

## 2013-01-16 NOTE — Telephone Encounter (Signed)
Dr. Grayland Ormond is not in office today, but was able to talk with the NP, and patient Femara was called in today just before lunch because of insurance purposes.

## 2013-01-16 NOTE — Assessment & Plan Note (Signed)
Annual comprehensive exam was done including pelvic and PAP smear. All screenings have been addressed .

## 2013-01-16 NOTE — Telephone Encounter (Signed)
Can you see if [name] at the [facility] can call me today about Kim Sanchez?  She has 4 days of her Aromasin left before needing a refill and would like to change to Femara for cost savings

## 2013-01-16 NOTE — Assessment & Plan Note (Signed)
Managed with red yeast rice given her history of statin intolerance. Although her LDL is 150 today her HDL is 90. She has no history of diabetes hypertension or early family history of coronary artery disease. No changes today

## 2013-01-16 NOTE — Patient Instructions (Addendum)
You had your annual Medicare wellness exam today.  Your PAP smear results should take about a week.,       You need to have a TDaP vaccine;  I have given you a   prescription for this because it will be cheaper at the health Dept or at your  local pharmacy because Medicare will not reimburse for it.   We will contact you with the bloodwork results

## 2013-01-17 ENCOUNTER — Encounter: Payer: Self-pay | Admitting: Internal Medicine

## 2013-01-17 ENCOUNTER — Ambulatory Visit: Payer: Self-pay | Admitting: Oncology

## 2013-01-17 DIAGNOSIS — Z79899 Other long term (current) drug therapy: Secondary | ICD-10-CM | POA: Diagnosis not present

## 2013-01-17 DIAGNOSIS — C50919 Malignant neoplasm of unspecified site of unspecified female breast: Secondary | ICD-10-CM | POA: Diagnosis not present

## 2013-01-17 DIAGNOSIS — Z9189 Other specified personal risk factors, not elsewhere classified: Secondary | ICD-10-CM | POA: Diagnosis not present

## 2013-01-17 DIAGNOSIS — Z853 Personal history of malignant neoplasm of breast: Secondary | ICD-10-CM | POA: Diagnosis not present

## 2013-01-18 ENCOUNTER — Encounter: Payer: Self-pay | Admitting: Internal Medicine

## 2013-01-18 DIAGNOSIS — Z79899 Other long term (current) drug therapy: Secondary | ICD-10-CM | POA: Diagnosis not present

## 2013-01-18 LAB — URINE CULTURE
Colony Count: NO GROWTH
Organism ID, Bacteria: NO GROWTH

## 2013-01-19 ENCOUNTER — Encounter: Payer: Self-pay | Admitting: Internal Medicine

## 2013-01-19 NOTE — Telephone Encounter (Signed)
Mailed unread message to pt  

## 2013-01-23 ENCOUNTER — Encounter: Payer: Medicare Other | Admitting: Internal Medicine

## 2013-01-30 ENCOUNTER — Encounter: Payer: Medicare Other | Admitting: Internal Medicine

## 2013-01-31 DIAGNOSIS — K5090 Crohn's disease, unspecified, without complications: Secondary | ICD-10-CM | POA: Diagnosis not present

## 2013-02-02 ENCOUNTER — Encounter: Payer: Self-pay | Admitting: Internal Medicine

## 2013-02-07 ENCOUNTER — Ambulatory Visit: Payer: Self-pay | Admitting: Oncology

## 2013-02-07 DIAGNOSIS — C50919 Malignant neoplasm of unspecified site of unspecified female breast: Secondary | ICD-10-CM | POA: Diagnosis not present

## 2013-02-07 DIAGNOSIS — K219 Gastro-esophageal reflux disease without esophagitis: Secondary | ICD-10-CM | POA: Diagnosis not present

## 2013-02-07 DIAGNOSIS — Z170 Estrogen receptor positive status [ER+]: Secondary | ICD-10-CM | POA: Diagnosis not present

## 2013-02-07 DIAGNOSIS — M129 Arthropathy, unspecified: Secondary | ICD-10-CM | POA: Diagnosis not present

## 2013-02-07 DIAGNOSIS — Z79899 Other long term (current) drug therapy: Secondary | ICD-10-CM | POA: Diagnosis not present

## 2013-02-07 DIAGNOSIS — K148 Other diseases of tongue: Secondary | ICD-10-CM | POA: Diagnosis not present

## 2013-02-07 DIAGNOSIS — Z79811 Long term (current) use of aromatase inhibitors: Secondary | ICD-10-CM | POA: Diagnosis not present

## 2013-02-09 DIAGNOSIS — Z23 Encounter for immunization: Secondary | ICD-10-CM | POA: Diagnosis not present

## 2013-02-17 DIAGNOSIS — K5090 Crohn's disease, unspecified, without complications: Secondary | ICD-10-CM | POA: Diagnosis not present

## 2013-03-04 ENCOUNTER — Ambulatory Visit: Payer: Self-pay | Admitting: Oncology

## 2013-03-08 ENCOUNTER — Ambulatory Visit (INDEPENDENT_AMBULATORY_CARE_PROVIDER_SITE_OTHER): Payer: Medicare Other | Admitting: General Surgery

## 2013-03-08 ENCOUNTER — Encounter: Payer: Self-pay | Admitting: General Surgery

## 2013-03-08 VITALS — BP 172/80 | HR 82 | Resp 12 | Ht 62.5 in | Wt 138.0 lb

## 2013-03-08 DIAGNOSIS — C50919 Malignant neoplasm of unspecified site of unspecified female breast: Secondary | ICD-10-CM

## 2013-03-08 DIAGNOSIS — C50911 Malignant neoplasm of unspecified site of right female breast: Secondary | ICD-10-CM

## 2013-03-08 NOTE — Progress Notes (Signed)
Patient ID: Kim Sanchez, female   DOB: 27-May-1940, 72 y.o.   MRN: [id number]  Chief Complaint  Patient presents with  . Follow-up    right breast sore and irritated    HPI Kim Sanchez is a 72 y.o. female who presents for an evaluation of right breast soreness and irritation. The patient states this started approximately 2 weeks ago. She states the soreness and irritation is not as bad as it was. She states the soreness is in the nipple area and underneath the breast. She is also sensitive in the right axillary where she had a lumpectomy and sentinel node biopsy on 09/14/12. She states has started some new exercises recently and didn't know if this contributed to this soreness and irritation.  The patient was scheduled for a followup exam with a right breast mammogram in December 2014. Her local discomfort prompted a request for early assessment. HPI  Past Medical History  Diagnosis Date  . Crohn's disease   . Arthritis   . GERD (gastroesophageal reflux disease)   . History of blood transfusion 1960  . 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    Past Surgical History  Procedure Laterality Date  . Dilation and curettage of uterus    . Brain surgery  1980    breast biopsies, benign  . Breast surgery  1980's    fibro cyst both breast in [place]  . Breast lumpectomy Right May 2014    T1c, N0; the R./PR positive, HER-2/neu not overexpressing.    Family History  Problem Relation Age of Onset  . Cancer Paternal Grandmother     Social History History  Substance Use Topics  . Smoking status: Never Smoker   . Smokeless tobacco: Never Used  . Alcohol Use: No    Allergies  Allergen Reactions  . Letrozole Anaphylaxis and Itching  . Exemestane Nausea Only  . Sulfa Antibiotics     Current Outpatient Prescriptions  Medication  Sig Dispense Refill  . anastrozole (ARIMIDEX) 1 MG tablet Take 1 tablet by mouth daily.      Marland Kitchen aspirin 81 MG tablet Take 81 mg by mouth daily.      . Biotin 10 MG CAPS Take by mouth.      . Calcium Carb-Cholecalciferol (CALCIUM + D3) 600-200 MG-UNIT TABS Take by mouth daily.       . Cholecalciferol (VITAMIN D3) 2000 UNITS TABS Take 1 tablet by mouth daily.      Marland Kitchen docusate sodium (COLACE) 100 MG capsule Take 100 mg by mouth 2 (two) times daily as needed.       . Fiber CHEW Chew by mouth as needed.      . fish oil-omega-3 fatty acids 1000 MG capsule Take 2 g by mouth daily.      Marland Kitchen ibuprofen (ADVIL,MOTRIN) 200 MG tablet Take 200 mg by mouth every 6 (six) hours as needed.      . inFLIXimab (REMICADE) 100 MG injection Inject 1 mg into the vein every 8 (eight) weeks.      Marland Kitchen LORazepam (ATIVAN) 0.5 MG tablet TAKE 1 TABLET BY MOUTH EVERY DAY AS NEEDED  30 tablet  5  . Multiple Vitamin (MULTIVITAMIN) tablet Take 1 tablet by mouth daily.      . polyethylene glycol (MIRALAX / GLYCOLAX) packet Take 17 g by mouth daily as needed.       Marland Kitchen  Probiotic Product (PROBIOTIC DAILY PO) Take by mouth.      . Red Yeast Rice 600 MG CAPS Take by mouth daily.       . TDaP (BOOSTRIX) 5-2.5-18.5 LF-MCG/0.5 injection Inject 0.5 mLs into the muscle once.  0.5 mL  0  . vitamin E 400 UNIT capsule Take 400 Units by mouth daily.       No current facility-administered medications for this visit.    Review of Systems Review of Systems  Constitutional: Negative.   Respiratory: Negative.   Cardiovascular: Negative.     Blood pressure 172/80, pulse 82, resp. rate 12, height 5' 2.5" (1.588 m), weight 138 lb (62.596 kg).  Physical Exam Physical Exam  Constitutional: She is oriented to person, place, and time. She appears well-developed and well-nourished.  Neck: No thyromegaly present.  Cardiovascular: Normal rate, regular rhythm and normal heart sounds.   No murmur heard. Pulmonary/Chest: Effort normal and breath sounds  normal. Right breast exhibits no inverted nipple, no mass, no nipple discharge, no skin change and no tenderness. Left breast exhibits no inverted nipple, no mass, no nipple discharge, no skin change and no tenderness.    1/2 inch raised area right breast 5 o'clock.    Little thickening along the scar. Slight discoloration secondary to radiation boost at the wide excision site.  Lymphadenopathy:    She has no cervical adenopathy.    She has no axillary adenopathy.  Neurological: She is alert and oriented to person, place, and time.  Skin: Skin is warm and dry.    Data Reviewed No new data.  Assessment    Benign breast exam.     Plan    The patient will complete her planned pain or cramping of the right breast next month as scheduled. She was asked to call the day after the exam was completed for her report after I have reviewed the films. Will plan on bilateral mammograms in 6 months.        [name] 03/08/2013, 6:39 PM

## 2013-03-08 NOTE — Patient Instructions (Addendum)
Patient to have mammogram in December 2014 as previously planned. Patient to call once she has had her mammogram done to let us know it has been done. Patient to return in June or July 2015 for follow up.

## 2013-04-04 DIAGNOSIS — K5090 Crohn's disease, unspecified, without complications: Secondary | ICD-10-CM | POA: Diagnosis not present

## 2013-04-11 LAB — HM MAMMOGRAPHY

## 2013-04-14 ENCOUNTER — Ambulatory Visit: Payer: Self-pay | Admitting: General Surgery

## 2013-04-14 DIAGNOSIS — C50919 Malignant neoplasm of unspecified site of unspecified female breast: Secondary | ICD-10-CM | POA: Diagnosis not present

## 2013-04-14 DIAGNOSIS — R922 Inconclusive mammogram: Secondary | ICD-10-CM | POA: Diagnosis not present

## 2013-04-17 ENCOUNTER — Telehealth: Payer: Self-pay | Admitting: General Surgery

## 2013-04-17 NOTE — Telephone Encounter (Signed)
Reviewed 04/14/13 mammograms. No area of concern. BIRAD 1.

## 2013-04-19 ENCOUNTER — Encounter: Payer: Self-pay | Admitting: General Surgery

## 2013-05-05 ENCOUNTER — Encounter: Payer: Self-pay | Admitting: Adult Health

## 2013-05-05 ENCOUNTER — Ambulatory Visit (INDEPENDENT_AMBULATORY_CARE_PROVIDER_SITE_OTHER): Payer: Medicare Other | Admitting: Adult Health

## 2013-05-05 VITALS — BP 164/70 | HR 80 | Temp 98.4°F | Resp 12 | Wt 141.0 lb

## 2013-05-05 DIAGNOSIS — IMO0001 Reserved for inherently not codable concepts without codable children: Secondary | ICD-10-CM

## 2013-05-05 DIAGNOSIS — I10 Essential (primary) hypertension: Secondary | ICD-10-CM | POA: Insufficient documentation

## 2013-05-05 DIAGNOSIS — R030 Elevated blood-pressure reading, without diagnosis of hypertension: Secondary | ICD-10-CM | POA: Diagnosis not present

## 2013-05-05 MED ORDER — HYDROCHLOROTHIAZIDE 25 MG PO TABS: ORAL_TABLET | ORAL | Status: DC

## 2013-05-05 NOTE — Assessment & Plan Note (Signed)
Start hydrochlorothiazide 25 mg 1/2 tablet in the morning. Increase lorazepam to full tablet. She has only been taking one half tablet at bedtime. Return to clinic in one week for followup and check bmet.

## 2013-05-05 NOTE — Patient Instructions (Signed)
  Start Hydrochlorothiazide (HCTZ) 25 mg 1/2 tablet in the morning.  Monitor your blood pressure around midday and record. Bring these numbers with you at your follow up visit.  Try taking a full tablets of your lorazepam before bedtime.  Return for follow up appointment in 1 week.

## 2013-05-05 NOTE — Progress Notes (Signed)
Pre visit review using our clinic review tool, if applicable. No additional management support is needed unless otherwise documented below in the visit note. 

## 2013-05-05 NOTE — Progress Notes (Signed)
   Subjective:    Patient ID: Kim Sanchez, female    DOB: 07-Apr-1941, 73 y.o.   MRN: [id number]  HPI  Patient is a pleasant 73 y/o female currently undergoing tx for breast cancer who presents to clinic with reports of elevated B/P readings for the past month. She also reports that she has not been sleeping well. Pt has been taking 1/2 tablet of lorazepam 0.5 mg at bedtime with noticeable decrease in b/p when she takes. She has not been able to exercise any in the month of December.  Current Outpatient Prescriptions on File Prior to Visit  Medication Sig Dispense Refill  . anastrozole (ARIMIDEX) 1 MG tablet Take 1 tablet by mouth daily.      . Biotin 10 MG CAPS Take by mouth.      . Calcium Carb-Cholecalciferol (CALCIUM + D3) 600-200 MG-UNIT TABS Take by mouth daily.       . Cholecalciferol (VITAMIN D3) 2000 UNITS TABS Take 1 tablet by mouth daily.      Marland Kitchen docusate sodium (COLACE) 100 MG capsule Take 100 mg by mouth 2 (two) times daily as needed.       . Fiber CHEW Chew by mouth as needed.      . fish oil-omega-3 fatty acids 1000 MG capsule Take 2 g by mouth daily.      Marland Kitchen ibuprofen (ADVIL,MOTRIN) 200 MG tablet Take 200 mg by mouth every 6 (six) hours as needed.      . inFLIXimab (REMICADE) 100 MG injection Inject 1 mg into the vein every 8 (eight) weeks.      Marland Kitchen LORazepam (ATIVAN) 0.5 MG tablet TAKE 1 TABLET BY MOUTH EVERY DAY AS NEEDED  30 tablet  5  . Multiple Vitamin (MULTIVITAMIN) tablet Take 1 tablet by mouth daily.      . polyethylene glycol (MIRALAX / GLYCOLAX) packet Take 17 g by mouth daily as needed.       . Red Yeast Rice 600 MG CAPS Take by mouth daily.       . TDaP (BOOSTRIX) 5-2.5-18.5 LF-MCG/0.5 injection Inject 0.5 mLs into the muscle once.  0.5 mL  0   No current facility-administered medications on file prior to visit.    Review of Systems  Constitutional: Negative.   Respiratory: Negative.   Cardiovascular: Negative.   Neurological: Negative.   Psychiatric/Behavioral:  Negative for confusion and agitation. The patient is nervous/anxious.   All other systems reviewed and are negative.       Objective:   Physical Exam  Constitutional: She is oriented to person, place, and time. She appears well-developed and well-nourished. No distress.  Cardiovascular: Normal rate, regular rhythm, normal heart sounds and intact distal pulses.  Exam reveals no gallop and no friction rub.   No murmur heard. Pulmonary/Chest: Effort normal and breath sounds normal. No respiratory distress. She has no wheezes. She has no rales.  Musculoskeletal: Normal range of motion.  Neurological: She is alert and oriented to person, place, and time.  Skin: Skin is warm.  Psychiatric: She has a normal mood and affect. Her behavior is normal. Judgment and thought content normal.          Assessment & Plan:

## 2013-05-12 ENCOUNTER — Encounter: Payer: Self-pay | Admitting: Adult Health

## 2013-05-12 ENCOUNTER — Ambulatory Visit (INDEPENDENT_AMBULATORY_CARE_PROVIDER_SITE_OTHER): Payer: Medicare Other | Admitting: Adult Health

## 2013-05-12 VITALS — BP 142/68 | HR 82 | Resp 12 | Wt 140.0 lb

## 2013-05-12 DIAGNOSIS — L659 Nonscarring hair loss, unspecified: Secondary | ICD-10-CM | POA: Diagnosis not present

## 2013-05-12 DIAGNOSIS — E785 Hyperlipidemia, unspecified: Secondary | ICD-10-CM | POA: Diagnosis not present

## 2013-05-12 DIAGNOSIS — R030 Elevated blood-pressure reading, without diagnosis of hypertension: Secondary | ICD-10-CM

## 2013-05-12 DIAGNOSIS — IMO0001 Reserved for inherently not codable concepts without codable children: Secondary | ICD-10-CM

## 2013-05-12 DIAGNOSIS — Z79899 Other long term (current) drug therapy: Secondary | ICD-10-CM

## 2013-05-12 LAB — LIPID PANEL
CHOL/HDL RATIO: 4
Cholesterol: 263 mg/dL — ABNORMAL HIGH (ref 0–200)
HDL: 71.4 mg/dL (ref >39.00)
Triglycerides: 89 mg/dL (ref 0.0–149.0)
VLDL: 17.8 mg/dL (ref 0.0–40.0)

## 2013-05-12 LAB — BASIC METABOLIC PANEL
BUN: 13 mg/dL (ref 6–23)
CO2: 29 meq/L (ref 19–32)
Calcium: 9.5 mg/dL (ref 8.4–10.5)
Chloride: 103 mEq/L (ref 96–112)
Creatinine, Ser: 0.7 mg/dL (ref 0.4–1.2)
GFR: 87.41 mL/min (ref >60.00)
GLUCOSE: 121 mg/dL — AB (ref 70–99)
Potassium: 3.7 mEq/L (ref 3.5–5.1)
Sodium: 140 mEq/L (ref 135–145)

## 2013-05-12 LAB — TSH: TSH: 3.48 u[IU]/mL (ref 0.35–5.50)

## 2013-05-12 LAB — LDL CHOLESTEROL, DIRECT: LDL DIRECT: 163.8 mg/dL

## 2013-05-12 NOTE — Assessment & Plan Note (Signed)
She has been taking HCTZ 25 mg one half tablet every morning. Blood pressure readings as follows:  131/68 - 05/06/13 138/69 - 05/07/13 119/65  05/09/13 115/63 - 05/10/13 139/73 - 05/10/13 PM 161/75 - 05/11/13 AM 125/72 - 05/11/13 PM  Blood pressure better controlled. Continue medication. Check bmet

## 2013-05-12 NOTE — Assessment & Plan Note (Signed)
Cholesterol elevated 3 months ago. Patient requesting to have lipids checked.

## 2013-05-12 NOTE — Progress Notes (Signed)
Subjective:    Patient ID: Kim Sanchez, female    DOB: [birth year], 73 y.o.   MRN: [id number]  HPI  Patient is a pleasant 73 y/o female who was seen in clinic 1 week ago for reports of elevated b/p. Medications adjusted and asked to return for follow up in one week. She increased the lorazepam to a full tablet at bedtime but she is waking up feeling sluggish. She would like to split the tablet and take one half of a tablet during the day and then the other half at bedtime.  Patient has been undergoing treatment for breast cancer. She reports that she has been losing hair. The treatment has not been chemotherapy. It has been radiation. She also would like to have her cholesterol checked since it was elevated in September. She has a followup appointment with her PCP in March.   Current Outpatient Prescriptions on File Prior to Visit  Medication Sig Dispense Refill  . anastrozole (ARIMIDEX) 1 MG tablet Take 1 tablet by mouth daily.      . Biotin 10 MG CAPS Take by mouth.      . Calcium Carb-Cholecalciferol (CALCIUM + D3) 600-200 MG-UNIT TABS Take by mouth daily.       . Cholecalciferol (VITAMIN D3) 2000 UNITS TABS Take 1 tablet by mouth daily.      Marland Kitchen docusate sodium (COLACE) 100 MG capsule Take 100 mg by mouth 2 (two) times daily as needed.       . Fiber CHEW Chew by mouth as needed.      . fish oil-omega-3 fatty acids 1000 MG capsule Take 2 g by mouth daily.      . hydrochlorothiazide (HYDRODIURIL) 25 MG tablet Take 1/2 tablet in the morning.  30 tablet  3  . ibuprofen (ADVIL,MOTRIN) 200 MG tablet Take 200 mg by mouth every 6 (six) hours as needed.      . inFLIXimab (REMICADE) 100 MG injection Inject 1 mg into the vein every 8 (eight) weeks.      Marland Kitchen LORazepam (ATIVAN) 0.5 MG tablet TAKE 1 TABLET BY MOUTH EVERY DAY AS NEEDED  30 tablet  5  . Multiple Vitamin (MULTIVITAMIN) tablet Take 1 tablet by mouth daily.      . polyethylene glycol (MIRALAX / GLYCOLAX) packet Take 17 g by mouth daily as  needed.       . Red Yeast Rice 600 MG CAPS Take by mouth daily.       . TDaP (BOOSTRIX) 5-2.5-18.5 LF-MCG/0.5 injection Inject 0.5 mLs into the muscle once.  0.5 mL  0   No current facility-administered medications on file prior to visit.      Review of Systems  Constitutional: Negative.   HENT: Negative.   Respiratory: Negative.   Cardiovascular: Negative.        Blood pressure readings improved since starting HCTZ  Gastrointestinal: Negative.   Genitourinary: Negative.   Skin:       Hair loss  Psychiatric/Behavioral: Negative.   All other systems reviewed and are negative.       Objective:   Physical Exam  Constitutional: She is oriented to person, place, and time. She appears well-developed and well-nourished. No distress.  Cardiovascular: Normal rate, regular rhythm, normal heart sounds and intact distal pulses.  Exam reveals no gallop and no friction rub.   No murmur heard. Pulmonary/Chest: Effort normal and breath sounds normal. No respiratory distress. She has no wheezes. She has no rales.  Musculoskeletal: Normal range of motion.  Neurological: She is alert and oriented to person, place, and time.  Skin: Skin is warm and dry.  Psychiatric: She has a normal mood and affect. Her behavior is normal. Judgment and thought content normal.          Assessment & Plan:   Elevated B/P Follow up: Pt was asked to start hydrochlorothiazide 25 mg 1/2 tablet in the morning. Increase lorazepam to full tablet at bedtime.  Return to clinic in one week for followup and check bmet.

## 2013-05-12 NOTE — Assessment & Plan Note (Signed)
Discussed with patient that this was normal in cancer treatment. She is not undergoing chemotherapy. She is undergoing radiation which could still contribute to hair loss. Check TSH to rule out thyroid cause

## 2013-05-12 NOTE — Progress Notes (Signed)
Pre visit review using our clinic review tool, if applicable. No additional management support is needed unless otherwise documented below in the visit note. 

## 2013-05-12 NOTE — Assessment & Plan Note (Signed)
Patient was started on HCTZ one week ago. Check bmet

## 2013-05-22 ENCOUNTER — Telehealth: Payer: Self-pay | Admitting: *Deleted

## 2013-05-22 NOTE — Telephone Encounter (Signed)
Patient c/o new Bp medication causing her tongue to be coated in white and her scalp to fill like something crawling in it and patient being fatigued since starting this medication . HCTZ 25 mg. Please advise, advised patient to hold til further notice.

## 2013-05-22 NOTE — Telephone Encounter (Signed)
Please tell pt to make appt to be evaluated for her new symptom and also for new b/p medication.

## 2013-05-23 NOTE — Telephone Encounter (Signed)
Spoke with pt, appt scheduled with Dr. Derrel Nip 06/01/13 to discuss. Pt declined earlier appointments with [facility].

## 2013-05-30 DIAGNOSIS — K5090 Crohn's disease, unspecified, without complications: Secondary | ICD-10-CM | POA: Diagnosis not present

## 2013-06-01 ENCOUNTER — Encounter: Payer: Self-pay | Admitting: Internal Medicine

## 2013-06-01 ENCOUNTER — Ambulatory Visit: Payer: Self-pay | Admitting: Radiation Oncology

## 2013-06-01 ENCOUNTER — Ambulatory Visit (INDEPENDENT_AMBULATORY_CARE_PROVIDER_SITE_OTHER): Payer: Medicare Other | Admitting: Internal Medicine

## 2013-06-01 VITALS — BP 138/76 | HR 76 | Temp 98.6°F | Resp 12 | Wt 141.0 lb

## 2013-06-01 DIAGNOSIS — Z09 Encounter for follow-up examination after completed treatment for conditions other than malignant neoplasm: Secondary | ICD-10-CM | POA: Diagnosis not present

## 2013-06-01 DIAGNOSIS — C50919 Malignant neoplasm of unspecified site of unspecified female breast: Secondary | ICD-10-CM | POA: Diagnosis not present

## 2013-06-01 DIAGNOSIS — K5900 Constipation, unspecified: Secondary | ICD-10-CM | POA: Diagnosis not present

## 2013-06-01 DIAGNOSIS — Z170 Estrogen receptor positive status [ER+]: Secondary | ICD-10-CM | POA: Diagnosis not present

## 2013-06-01 DIAGNOSIS — E785 Hyperlipidemia, unspecified: Secondary | ICD-10-CM

## 2013-06-01 DIAGNOSIS — Z79899 Other long term (current) drug therapy: Secondary | ICD-10-CM | POA: Diagnosis not present

## 2013-06-01 DIAGNOSIS — I10 Essential (primary) hypertension: Secondary | ICD-10-CM | POA: Diagnosis not present

## 2013-06-01 DIAGNOSIS — Z79811 Long term (current) use of aromatase inhibitors: Secondary | ICD-10-CM | POA: Diagnosis not present

## 2013-06-01 DIAGNOSIS — Z923 Personal history of irradiation: Secondary | ICD-10-CM | POA: Diagnosis not present

## 2013-06-01 MED ORDER — AMLODIPINE BESYLATE 5 MG PO TABS: 2.5000 mg | ORAL_TABLET | Freq: Every day | ORAL | Status: DC

## 2013-06-01 MED ORDER — NYSTATIN 100000 UNIT/ML MT SUSP: 500000.0000 [IU] | Freq: Four times a day (QID) | OROMUCOSAL | Status: DC

## 2013-06-01 NOTE — Progress Notes (Signed)
Patient ID: Kim Sanchez, female   DOB: May 18, 1940, 73 y.o.   MRN: [id number]  Patient Active Problem List   Diagnosis Date Noted  . Unspecified constipation 06/03/2013  . Medication management 05/12/2013  . Hair loss 05/12/2013  . Hypertension 05/05/2013  . Routine general medical examination at a health care facility 01/16/2013  . Breast cancer, right breast 08/24/2012  . Screening for cervical cancer 01/18/2012  . Screening for colon cancer 01/18/2012  . Arthritis   . GERD (gastroesophageal reflux disease)   . History of blood transfusion   . Hyperlipidemia 11/27/2011  . Crohn's disease     Subjective:  CC:   Chief Complaint  Patient presents with  . Thrush  . Follow-up    started HCTZ    HPI:   Kim Sanchez a 73 y.o. female who presents for  Follow up on hypertension.  Patient was treated recently by NP Rey for hypertension with hydrochlorthiazide  And developed irritated dry eyes and nausea which she has attributed to the medication.  She started taking it at night and is tolerating it better but having nocturia x 1.      Past Medical History  Diagnosis Date  . Crohn's disease   . Arthritis   . GERD (gastroesophageal reflux disease)   . History of blood transfusion 1960  . 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    Past Surgical History  Procedure Laterality Date  . Dilation and curettage of uterus    . Brain surgery  1980    breast biopsies, benign  . Breast surgery  1980's    fibro cyst both breast in [place]  . Breast lumpectomy Right May 2014    T1c, N0; the R./PR positive, HER-2/neu not overexpressing.       The following portions of the patient's history were reviewed and updated as appropriate: Allergies, current medications, and problem list.    Review of Systems:   12 Pt  review of  systems was negative except those addressed in the HPI,     History   Social History  . Marital Status: Married    Spouse Name: N/A    Number of Children: N/A  . Years of Education: N/A   Occupational History  . Not on file.   Social History Main Topics  . Smoking status: Never Smoker   . Smokeless tobacco: Never Used  . Alcohol Use: No  . Drug Use: No  . Sexual Activity: Not Currently   Other Topics Concern  . Not on file   Social History Narrative  . No narrative on file    Objective:  Filed Vitals:   06/01/13 0859  BP: 138/76  Pulse: 76  Temp: 98.6 F (37 C)  Resp: 12     General appearance: alert, cooperative and appears stated age Ears: normal TM's and external ear canals both ears Throat: lips, mucosa, and tongue normal; teeth and gums normal Neck: no adenopathy, no carotid bruit, supple, symmetrical, trachea midline and thyroid not enlarged, symmetric, no tenderness/mass/nodules Back: symmetric, no curvature. ROM normal. No CVA tenderness. Lungs: clear to auscultation bilaterally Heart: regular rate and rhythm, S1, S2 normal, no murmur, click, rub or gallop Abdomen: soft, non-tender; bowel sounds normal; no masses,  no organomegaly Pulses: 2+ and symmetric Skin: Skin color, texture, turgor normal. No rashes or lesions Lymph nodes: Cervical, supraclavicular,  and axillary nodes normal.  Assessment and Plan:  Hypertension Developed nausea with morning administration of hctz,  So she has begun Taking it at night,  Eyes also blood shot. .  Discussed changing to amlodipine as the hctz may be dehydrating her .  However after less than a two day trial of amlodipine communicate via MyChart a host of symptoms suggestive of the common cold that she is attributing the amlodipine, and prefers to remain on hctz.   Hyperlipidemia Cholesterol elevated 3 months ago. Patient requesting to have lipids checked.    Unspecified constipation She has be using Smooth  Move Tea on a daily basis for several weeks.  Advised t limit [place] and increase her fi ber and water intake/    Updated Medication List Outpatient Encounter Prescriptions as of 06/01/2013  Medication Sig  . anastrozole (ARIMIDEX) 1 MG tablet Take 1 tablet by mouth daily.  . Biotin 10 MG CAPS Take by mouth.  . Calcium Carb-Cholecalciferol (CALCIUM + D3) 600-200 MG-UNIT TABS Take by mouth daily.   . Cholecalciferol (VITAMIN D3) 2000 UNITS TABS Take 1 tablet by mouth daily.  . Fiber CHEW Chew by mouth as needed.  . fish oil-omega-3 fatty acids 1000 MG capsule Take 2 g by mouth daily.  . hydrochlorothiazide (HYDRODIURIL) 25 MG tablet Take 1/2 tablet in the morning.  Marland Kitchen ibuprofen (ADVIL,MOTRIN) 200 MG tablet Take 200 mg by mouth every 6 (six) hours as needed.  . inFLIXimab (REMICADE) 100 MG injection Inject 1 mg into the vein every 8 (eight) weeks.  Marland Kitchen LORazepam (ATIVAN) 0.5 MG tablet TAKE 1 TABLET BY MOUTH EVERY DAY AS NEEDED  . Multiple Vitamin (MULTIVITAMIN) tablet Take 1 tablet by mouth daily.  . polyethylene glycol (MIRALAX / GLYCOLAX) packet Take 17 g by mouth daily as needed.   . Red Yeast Rice 600 MG CAPS Take by mouth daily.   . TDaP (BOOSTRIX) 5-2.5-18.5 LF-MCG/0.5 injection Inject 0.5 mLs into the muscle once.  . [DISCONTINUED] docusate sodium (COLACE) 100 MG capsule Take 100 mg by mouth 2 (two) times daily as needed.   . nystatin (MYCOSTATIN) 100000 UNIT/ML suspension Take 5 mLs (500,000 Units total) by mouth 4 (four) times daily.  . [DISCONTINUED] amLODipine (NORVASC) 5 MG tablet Take 0.5 tablets (2.5 mg total) by mouth daily.     No orders of the defined types were placed in this encounter.    No Follow-up on file.

## 2013-06-01 NOTE — Patient Instructions (Signed)
Your symptoms of nausea may be due to daily use of a diuretic for blood pressure   I am changing your daily BP medication  to amlodipine 5 mg . It is not a diuretic and  You can take it in the daytime or in the evening  Save the hctz to take AS NEEDED for lower extremity edema or fluid retention  Refresh, or Systaine eye drops for dry eye  Increase fiber to 25-35 g daily to help constipation Avoid daily use of Smooth Move Tea as your bowels may become addicted to the laxatave in it  [place] makes a low carb  Whole wheat tortilla with 26 g fiber Toufayan makes a similar tortilla with lots of fiber Flat out also makes flatbreads loaded with fiber   .Constipation, Adult Constipation is when a person has fewer than 3 bowel movements a week; has difficulty having a bowel movement; or has stools that are dry, hard, or larger than normal. As people grow older, constipation is more common. If you try to fix constipation with medicines that make you have a bowel movement (laxatives), the problem may get worse. Long-term laxative use may cause the muscles of the colon to become weak. A low-fiber diet, not taking in enough fluids, and taking certain medicines may make constipation worse. CAUSES   Certain medicines, such as antidepressants, pain medicine, iron supplements, antacids, and water pills.   Certain diseases, such as diabetes, irritable bowel syndrome (IBS), thyroid disease, or depression.   Not drinking enough water.   Not eating enough fiber-rich foods.   Stress or travel.  Lack of physical activity or exercise.  Not going to the restroom when there is the urge to have a bowel movement.  Ignoring the urge to have a bowel movement.  Using laxatives too much. SYMPTOMS   Having fewer than 3 bowel movements a week.   Straining to have a bowel movement.   Having hard, dry, or larger than normal stools.   Feeling full or bloated.   Pain in the lower abdomen.  Not  feeling relief after having a bowel movement. DIAGNOSIS  Your caregiver will take a medical history and perform a physical exam. Further testing may be done for severe constipation. Some tests may include:   A barium enema X-ray to examine your rectum, colon, and sometimes, your small intestine.  A sigmoidoscopy to examine your lower colon.  A colonoscopy to examine your entire colon. TREATMENT  Treatment will depend on the severity of your constipation and what is causing it. Some dietary treatments include drinking more fluids and eating more fiber-rich foods. Lifestyle treatments may include regular exercise. If these diet and lifestyle recommendations do not help, your caregiver may recommend taking over-the-counter laxative medicines to help you have bowel movements. Prescription medicines may be prescribed if over-the-counter medicines do not work.  HOME CARE INSTRUCTIONS   Increase dietary fiber in your diet, such as fruits, vegetables, whole grains, and beans. Limit high-fat and processed sugars in your diet, such as [place] fries, hamburgers, cookies, candies, and soda.   A fiber supplement may be added to your diet if you cannot get enough fiber from foods.   Drink enough fluids to keep your urine clear or pale yellow.   Exercise regularly or as directed by your caregiver.   Go to the restroom when you have the urge to go. Do not hold it.  Only take medicines as directed by your caregiver. Do not take other medicines for constipation  without talking to your caregiver first. [place] IF:   You have bright red blood in your stool.   Your constipation lasts for more than 4 days or gets worse.   You have abdominal or rectal pain.   You have thin, pencil-like stools.  You have unexplained weight loss. MAKE SURE YOU:   Understand these instructions.  Will watch your condition.  Will get help right away if you are not doing well or get  worse. Document Released: 01/17/2004 Document Revised: 07/13/2011 Document Reviewed: 01/30/2013 [facility] Patient Information 2014 [place], [place].   Hypertension As your heart beats, it forces blood through your arteries. This force is your blood pressure. If the pressure is too high, it is called hypertension (HTN) or high blood pressure. HTN is dangerous because you may have it and not know it. High blood pressure may mean that your heart has to work harder to pump blood. Your arteries may be narrow or stiff. The extra work puts you at risk for heart disease, stroke, and other problems.  Blood pressure consists of two numbers, a higher number over a lower, 110/72, for example. It is stated as "110 over 72." The ideal is below 120 for the top number (systolic) and under 80 for the bottom (diastolic). Write down your blood pressure today. You should pay close attention to your blood pressure if you have certain conditions such as:  Heart failure.  Prior heart attack.  Diabetes  Chronic kidney disease.  Prior stroke.  Multiple risk factors for heart disease. To see if you have HTN, your blood pressure should be measured while you are seated with your arm held at the level of the heart. It should be measured at least twice. A one-time elevated blood pressure reading (especially in the Emergency Department) does not mean that you need treatment. There may be conditions in which the blood pressure is different between your right and left arms. It is important to see your caregiver soon for a recheck. Most people have essential hypertension which means that there is not a specific cause. This type of high blood pressure may be lowered by changing lifestyle factors such as:  Stress.  Smoking.  Lack of exercise.  Excessive weight.  Drug/tobacco/alcohol use.  Eating less salt. Most people do not have symptoms from high blood pressure until it has caused damage to the body. Effective  treatment can often prevent, delay or reduce that damage. TREATMENT  When a cause has been identified, treatment for high blood pressure is directed at the cause. There are a large number of medications to treat HTN. These fall into several categories, and your caregiver will help you select the medicines that are best for you. Medications may have side effects. You should review side effects with your caregiver. If your blood pressure stays high after you have made lifestyle changes or started on medicines,   Your medication(s) may need to be changed.  Other problems may need to be addressed.  Be certain you understand your prescriptions, and know how and when to take your medicine.  Be sure to follow up with your caregiver within the time frame advised (usually within two weeks) to have your blood pressure rechecked and to review your medications.  If you are taking more than one medicine to lower your blood pressure, make sure you know how and at what times they should be taken. Taking two medicines at the same time can result in blood pressure that is  too low. SEEK IMMEDIATE MEDICAL CARE IF:  You develop a severe headache, blurred or changing vision, or confusion.  You have unusual weakness or numbness, or a faint feeling.  You have severe chest or abdominal pain, vomiting, or breathing problems. MAKE SURE YOU:   Understand these instructions.  Will watch your condition.  Will get help right away if you are not doing well or get worse. Document Released: 04/20/2005 Document Revised: 07/13/2011 Document Reviewed: 12/09/2007 [facility] Patient Information 2014 [place].

## 2013-06-01 NOTE — Progress Notes (Signed)
Pre visit review using our clinic review tool, if applicable. No additional management support is needed unless otherwise documented below in the visit note. 

## 2013-06-01 NOTE — Assessment & Plan Note (Addendum)
Developed nausea with morning administration of hctz,  So she has begun Taking it at night,  Eyes also blood shot. .  Discussed changing to amlodipine as the hctz may be dehydrating her .  However after less than a two day trial of amlodipine communicate via MyChart a host of symptoms suggestive of the common cold that she is attributing the amlodipine, and prefers to remain on hctz.

## 2013-06-02 ENCOUNTER — Telehealth: Payer: Self-pay | Admitting: Internal Medicine

## 2013-06-02 DIAGNOSIS — C50919 Malignant neoplasm of unspecified site of unspecified female breast: Secondary | ICD-10-CM | POA: Diagnosis not present

## 2013-06-02 DIAGNOSIS — Z09 Encounter for follow-up examination after completed treatment for conditions other than malignant neoplasm: Secondary | ICD-10-CM | POA: Diagnosis not present

## 2013-06-02 DIAGNOSIS — Z170 Estrogen receptor positive status [ER+]: Secondary | ICD-10-CM | POA: Diagnosis not present

## 2013-06-02 DIAGNOSIS — Z79811 Long term (current) use of aromatase inhibitors: Secondary | ICD-10-CM | POA: Diagnosis not present

## 2013-06-02 NOTE — Telephone Encounter (Signed)
Relevant patient education assigned to patient using Emmi. ° °

## 2013-06-03 ENCOUNTER — Encounter: Payer: Self-pay | Admitting: Internal Medicine

## 2013-06-03 DIAGNOSIS — K5900 Constipation, unspecified: Secondary | ICD-10-CM | POA: Insufficient documentation

## 2013-06-03 DIAGNOSIS — K5909 Other constipation: Secondary | ICD-10-CM | POA: Insufficient documentation

## 2013-06-03 NOTE — Assessment & Plan Note (Signed)
She has be using Smooth Move Tea on a daily basis for several weeks.  Advised t limit [place] and increase her fi ber and water intake/

## 2013-06-03 NOTE — Assessment & Plan Note (Signed)
Cholesterol elevated 3 months ago. Patient requesting to have lipids checked.

## 2013-06-04 ENCOUNTER — Ambulatory Visit: Payer: Self-pay | Admitting: Radiation Oncology

## 2013-06-23 ENCOUNTER — Ambulatory Visit: Payer: Medicare Other | Admitting: Internal Medicine

## 2013-07-06 ENCOUNTER — Ambulatory Visit: Payer: Self-pay | Admitting: Oncology

## 2013-07-06 DIAGNOSIS — K219 Gastro-esophageal reflux disease without esophagitis: Secondary | ICD-10-CM | POA: Diagnosis not present

## 2013-07-06 DIAGNOSIS — Z170 Estrogen receptor positive status [ER+]: Secondary | ICD-10-CM | POA: Diagnosis not present

## 2013-07-06 DIAGNOSIS — M129 Arthropathy, unspecified: Secondary | ICD-10-CM | POA: Diagnosis not present

## 2013-07-06 DIAGNOSIS — C50919 Malignant neoplasm of unspecified site of unspecified female breast: Secondary | ICD-10-CM | POA: Diagnosis not present

## 2013-07-06 DIAGNOSIS — Z79811 Long term (current) use of aromatase inhibitors: Secondary | ICD-10-CM | POA: Diagnosis not present

## 2013-07-07 DIAGNOSIS — Z79811 Long term (current) use of aromatase inhibitors: Secondary | ICD-10-CM | POA: Diagnosis not present

## 2013-07-07 DIAGNOSIS — C50919 Malignant neoplasm of unspecified site of unspecified female breast: Secondary | ICD-10-CM | POA: Diagnosis not present

## 2013-07-07 DIAGNOSIS — Z170 Estrogen receptor positive status [ER+]: Secondary | ICD-10-CM | POA: Diagnosis not present

## 2013-07-14 ENCOUNTER — Ambulatory Visit: Payer: Medicare Other | Admitting: Internal Medicine

## 2013-07-17 ENCOUNTER — Ambulatory Visit: Payer: Medicare Other | Admitting: Internal Medicine

## 2013-07-28 ENCOUNTER — Encounter: Payer: Medicare Other | Admitting: Internal Medicine

## 2013-07-30 DIAGNOSIS — J019 Acute sinusitis, unspecified: Secondary | ICD-10-CM | POA: Diagnosis not present

## 2013-07-30 DIAGNOSIS — J209 Acute bronchitis, unspecified: Secondary | ICD-10-CM | POA: Diagnosis not present

## 2013-08-02 ENCOUNTER — Ambulatory Visit: Payer: Self-pay | Admitting: Oncology

## 2013-08-08 DIAGNOSIS — K5090 Crohn's disease, unspecified, without complications: Secondary | ICD-10-CM | POA: Diagnosis not present

## 2013-08-10 ENCOUNTER — Ambulatory Visit (INDEPENDENT_AMBULATORY_CARE_PROVIDER_SITE_OTHER): Payer: Medicare Other | Admitting: Internal Medicine

## 2013-08-10 ENCOUNTER — Encounter: Payer: Self-pay | Admitting: Internal Medicine

## 2013-08-10 VITALS — BP 148/80 | HR 70 | Temp 98.4°F | Resp 16 | Ht 63.0 in | Wt 142.5 lb

## 2013-08-10 DIAGNOSIS — E785 Hyperlipidemia, unspecified: Secondary | ICD-10-CM

## 2013-08-10 DIAGNOSIS — Z1211 Encounter for screening for malignant neoplasm of colon: Secondary | ICD-10-CM | POA: Diagnosis not present

## 2013-08-10 DIAGNOSIS — K5090 Crohn's disease, unspecified, without complications: Secondary | ICD-10-CM

## 2013-08-10 DIAGNOSIS — N390 Urinary tract infection, site not specified: Secondary | ICD-10-CM

## 2013-08-10 DIAGNOSIS — Z124 Encounter for screening for malignant neoplasm of cervix: Secondary | ICD-10-CM | POA: Diagnosis not present

## 2013-08-10 DIAGNOSIS — I10 Essential (primary) hypertension: Secondary | ICD-10-CM | POA: Diagnosis not present

## 2013-08-10 DIAGNOSIS — C50919 Malignant neoplasm of unspecified site of unspecified female breast: Secondary | ICD-10-CM

## 2013-08-10 DIAGNOSIS — C50911 Malignant neoplasm of unspecified site of right female breast: Secondary | ICD-10-CM

## 2013-08-10 LAB — POCT URINALYSIS DIPSTICK
BILIRUBIN UA: NEGATIVE
Glucose, UA: NEGATIVE
KETONES UA: NEGATIVE
Nitrite, UA: NEGATIVE
PH UA: 5.5
Protein, UA: NEGATIVE
Spec Grav, UA: 1.015
Urobilinogen, UA: 0.2

## 2013-08-10 MED ORDER — SPIRONOLACTONE 12.5 MG HALF TABLET: 12.5000 mg | ORAL_TABLET | Freq: Every day | ORAL | Status: DC

## 2013-08-10 MED ORDER — ZOSTER VACCINE LIVE 19400 UNT/0.65ML ~~LOC~~ SOLR: 0.6500 mL | Freq: Once | SUBCUTANEOUS | Status: DC

## 2013-08-10 MED ORDER — TETANUS-DIPHTH-ACELL PERTUSSIS 5-2.5-18.5 LF-MCG/0.5 IM SUSP: 0.5000 mL | Freq: Once | INTRAMUSCULAR | Status: DC

## 2013-08-10 NOTE — Patient Instructions (Signed)
Ideal BP is 140/80 or less   We are starting a trial of spironolactone,  A mild diuretic  at 12. 5 mg for edema and BP  No PAP smear needed until Sept 2016  Next annual wellness would not be due until Sept 2015 but since we essentially did one today you can just return in 6 months (October) for a follow up on hypertension  I recommend that you get a  tetanus-diptheria-pertussis vaccine (TDaP) and a Shingles vaccine but you can get them for less $$$ at the [facility]   The Prevnar vaccine is also recommended,  Medicare will pay for htat one here

## 2013-08-10 NOTE — Progress Notes (Signed)
Pre-visit discussion using our clinic review tool. No additional management support is needed unless otherwise documented below in the visit note.  

## 2013-08-11 LAB — URINE CULTURE
COLONY COUNT: NO GROWTH
ORGANISM ID, BACTERIA: NO GROWTH

## 2013-08-12 ENCOUNTER — Encounter: Payer: Self-pay | Admitting: Internal Medicine

## 2013-08-12 NOTE — Assessment & Plan Note (Signed)
She did not tolerate amlodipine due to symptoms of common cold which she developed at the time of initiation of therapy.  Her blod pressure is above goal without it.  Adding spironolactone

## 2013-08-12 NOTE — Assessment & Plan Note (Addendum)
S/p wide excision  and XRT .  Now on aromatase inhibitor therapy with Arimidex

## 2013-08-12 NOTE — Progress Notes (Signed)
Patient ID: Kim Sanchez, female   DOB: 05/21/1940, 73 y.o.   MRN: [id number]  Subjective:     Kim Sanchez is a 73 y.o. female and is here for a comprehensive physical exam. The patient reports trouble tolerating amlodipine .  History   Social History  . Marital Status: Married    Spouse Name: N/A    Number of Children: N/A  . Years of Education: N/A   Occupational History  . Not on file.   Social History Main Topics  . Smoking status: Never Smoker   . Smokeless tobacco: Never Used  . Alcohol Use: No  . Drug Use: No  . Sexual Activity: Not Currently   Other Topics Concern  . Not on file   Social History Narrative  . No narrative on file   Health Maintenance  Topic Date Due  . Tetanus/tdap  04/20/1960  . Zostavax  04/20/2001  . Pneumococcal Polysaccharide Vaccine Age 79 And Over  04/20/2006  . Influenza Vaccine  12/02/2013  . Mammogram  04/12/2015  . Colonoscopy  07/29/2020    The following portions of the patient's history were reviewed and updated as appropriate: allergies, current medications, past family history, past medical history, past social history, past surgical history and problem list.  Review of Systems A comprehensive review of systems was negative.   Objective:   General Appearance:    Alert, cooperative, no distress, appears stated age  Head:    Normocephalic, without obvious abnormality, atraumatic  Eyes:    PERRL, conjunctiva/corneas clear, EOM's intact, fundi    benign, both eyes  Ears:    Normal TM's and external ear canals, both ears  Nose:   Nares normal, septum midline, mucosa normal, no drainage    or sinus tenderness  Throat:   Lips, mucosa, and tongue normal; teeth and gums normal  Neck:   Supple, symmetrical, trachea midline, no adenopathy;    thyroid:  no enlargement/tenderness/nodules; no carotid   bruit or JVD  Back:     Symmetric, no curvature, ROM normal, no CVA tenderness  Lungs:     Clear to auscultation bilaterally,  respirations unlabored  Chest Wall:    No tenderness or deformity   Heart:    Regular rate and rhythm, S1 and S2 normal, no murmur, rub   or gallop  Breast Exam:    No tenderness, masses, or nipple abnormality. Well healed incision scar right breast,  Upper outer quadrant  Abdomen:     Soft, non-tender, bowel sounds active all four quadrants,    no masses, no organomegaly  Genitalia:    Pelvic: cervix normal in appearance, external genitalia normal, no adnexal masses or tenderness, no cervical motion tenderness, rectovaginal septum normal, uterus normal size, shape, and consistency and vagina normal without discharge  Extremities:   Extremities normal, atraumatic, no cyanosis or edema  Pulses:   2+ and symmetric all extremities  Skin:   Skin color, texture, turgor normal, no rashes or lesions  Lymph nodes:   Cervical, supraclavicular, and axillary nodes normal  Neurologic:   CNII-XII intact, normal strength, sensation and reflexes    throughout     Assessment and Plan:   Hypertension She did not tolerate amlodipine due to symptoms of common cold which she developed at the time of initiation of therapy.  Her blod pressure is above goal without it.  Adding spironolactone  Screening for cervical cancer Pelvic without PAP was done today per patient request  Screening for colon cancer Done in  2012 by [facility] ;  Has  history of Crohn's disease  Breast cancer, right breast S/p wide excision  and XRT .  Now on aromatase inhibitor therapy with Arimidex     Crohn's disease Managed with Remicaide.  Currently symptom free.   Hyperlipidemia .recent fasting lipids reviewed. Her 10 yr risk of CAD is 18% .She has a history of statin  Intolerance .    Lab Results  Component Value Date   CHOL 263* 05/12/2013   HDL 71.40 05/12/2013   LDLDIRECT 163.8 05/12/2013   TRIG 89.0 05/12/2013   CHOLHDL 4 05/12/2013     A total of 40 minutes was spent with patient more than half of which was spent in  counseling, reviewing records from other prviders and coordination of care.  Updated Medication List Outpatient Encounter Prescriptions as of 08/10/2013  Medication Sig  . anastrozole (ARIMIDEX) 1 MG tablet Take 1 tablet by mouth daily.  . Biotin 10 MG CAPS Take by mouth.  . Calcium Carb-Cholecalciferol (CALCIUM + D3) 600-200 MG-UNIT TABS Take by mouth daily.   . Cholecalciferol (VITAMIN D3) 2000 UNITS TABS Take 1 tablet by mouth daily.  . Fiber CHEW Chew by mouth as needed.  . fish oil-omega-3 fatty acids 1000 MG capsule Take 2 g by mouth daily.  Marland Kitchen ibuprofen (ADVIL,MOTRIN) 200 MG tablet Take 200 mg by mouth every 6 (six) hours as needed.  . inFLIXimab (REMICADE) 100 MG injection Inject 1 mg into the vein every 8 (eight) weeks.  Marland Kitchen LORazepam (ATIVAN) 0.5 MG tablet TAKE 1 TABLET BY MOUTH EVERY DAY AS NEEDED  . Multiple Vitamin (MULTIVITAMIN) tablet Take 1 tablet by mouth daily.  Marland Kitchen nystatin (MYCOSTATIN) 100000 UNIT/ML suspension Take 5 mLs (500,000 Units total) by mouth 4 (four) times daily.  . polyethylene glycol (MIRALAX / GLYCOLAX) packet Take 17 g by mouth daily as needed.   . Red Yeast Rice 600 MG CAPS Take by mouth daily.   Marland Kitchen spironolactone (ALDACTONE) 12.5 mg TABS tablet Take 0.5 tablets (12.5 mg total) by mouth daily.  . Tdap (BOOSTRIX) 5-2.5-18.5 LF-MCG/0.5 injection Inject 0.5 mLs into the muscle once.  . zoster vaccine live, PF, (ZOSTAVAX) 43200 UNT/0.65ML injection Inject 19,400 Units into the skin once.  . [DISCONTINUED] hydrochlorothiazide (HYDRODIURIL) 25 MG tablet Take 1/2 tablet in the morning.  . [DISCONTINUED] TDaP (BOOSTRIX) 5-2.5-18.5 LF-MCG/0.5 injection Inject 0.5 mLs into the muscle once.

## 2013-08-12 NOTE — Assessment & Plan Note (Signed)
Managed with Remicaide.  Currently symptom free.

## 2013-08-12 NOTE — Assessment & Plan Note (Addendum)
Done in 2012 by [facility] ;  Has  history of Crohn's disease

## 2013-08-12 NOTE — Assessment & Plan Note (Signed)
Pelvic without PAP was done today per patient request

## 2013-08-12 NOTE — Assessment & Plan Note (Addendum)
.  recent fasting lipids reviewed. Her 10 yr risk of CAD is 18% .She has a history of statin  Intolerance .    Lab Results  Component Value Date   CHOL 263* 05/12/2013   HDL 71.40 05/12/2013   LDLDIRECT 163.8 05/12/2013   TRIG 89.0 05/12/2013   CHOLHDL 4 05/12/2013

## 2013-08-14 NOTE — Telephone Encounter (Signed)
Mailed unread message to pt  

## 2013-08-18 LAB — HEPATIC FUNCTION PANEL
ALK PHOS: 82 U/L (ref 25–125)
ALT: 13 U/L (ref 7–35)
AST: 17 U/L (ref 13–35)
BILIRUBIN, TOTAL: 0.9 mg/dL

## 2013-08-18 LAB — CBC AND DIFFERENTIAL
Hemoglobin: 13.3 g/dL (ref 12.0–16.0)
Platelets: 241 10*3/uL (ref 150–399)
WBC: 4.1 10*3/mL

## 2013-08-23 DIAGNOSIS — L538 Other specified erythematous conditions: Secondary | ICD-10-CM | POA: Diagnosis not present

## 2013-08-23 DIAGNOSIS — D485 Neoplasm of uncertain behavior of skin: Secondary | ICD-10-CM | POA: Diagnosis not present

## 2013-08-25 DIAGNOSIS — K5090 Crohn's disease, unspecified, without complications: Secondary | ICD-10-CM | POA: Diagnosis not present

## 2013-09-15 ENCOUNTER — Encounter: Payer: Self-pay | Admitting: Internal Medicine

## 2013-09-19 ENCOUNTER — Encounter: Payer: Self-pay | Admitting: Internal Medicine

## 2013-09-19 ENCOUNTER — Telehealth: Payer: Self-pay | Admitting: Internal Medicine

## 2013-09-19 DIAGNOSIS — G4700 Insomnia, unspecified: Secondary | ICD-10-CM

## 2013-09-19 MED ORDER — LORAZEPAM 0.5 MG PO TABS: ORAL_TABLET | ORAL | Status: DC

## 2013-09-19 NOTE — Telephone Encounter (Signed)
Patient called requesting refill on Lorazepam please advise last fill 01/16/13 last OV 08/10/13

## 2013-09-19 NOTE — Telephone Encounter (Signed)
Please  make an appt (30 minutes needed) to discuss your symptoms.

## 2013-09-19 NOTE — Telephone Encounter (Signed)
Ok to refill,  printed rx  

## 2013-09-20 NOTE — Telephone Encounter (Signed)
Appointment made for 10/03/13

## 2013-09-20 NOTE — Telephone Encounter (Signed)
Rx faxed to pharmacy  

## 2013-09-21 ENCOUNTER — Ambulatory Visit: Payer: Medicare Other | Admitting: General Surgery

## 2013-09-22 DIAGNOSIS — Z853 Personal history of malignant neoplasm of breast: Secondary | ICD-10-CM | POA: Diagnosis not present

## 2013-09-22 DIAGNOSIS — Z9889 Other specified postprocedural states: Secondary | ICD-10-CM | POA: Diagnosis not present

## 2013-09-26 ENCOUNTER — Encounter: Payer: Self-pay | Admitting: General Surgery

## 2013-10-03 ENCOUNTER — Ambulatory Visit (INDEPENDENT_AMBULATORY_CARE_PROVIDER_SITE_OTHER): Payer: Medicare Other | Admitting: Internal Medicine

## 2013-10-03 ENCOUNTER — Encounter: Payer: Self-pay | Admitting: Internal Medicine

## 2013-10-03 VITALS — BP 130/76 | HR 84 | Temp 98.4°F | Resp 16 | Ht 63.0 in | Wt 142.2 lb

## 2013-10-03 DIAGNOSIS — Z79899 Other long term (current) drug therapy: Secondary | ICD-10-CM | POA: Diagnosis not present

## 2013-10-03 DIAGNOSIS — R7309 Other abnormal glucose: Secondary | ICD-10-CM

## 2013-10-03 DIAGNOSIS — E785 Hyperlipidemia, unspecified: Secondary | ICD-10-CM | POA: Diagnosis not present

## 2013-10-03 DIAGNOSIS — I10 Essential (primary) hypertension: Secondary | ICD-10-CM | POA: Diagnosis not present

## 2013-10-03 DIAGNOSIS — R739 Hyperglycemia, unspecified: Secondary | ICD-10-CM

## 2013-10-03 LAB — BASIC METABOLIC PANEL
BUN: 15 mg/dL (ref 6–23)
CO2: 29 mEq/L (ref 19–32)
Calcium: 9.8 mg/dL (ref 8.4–10.5)
Chloride: 102 mEq/L (ref 96–112)
Creatinine, Ser: 0.7 mg/dL (ref 0.4–1.2)
GFR: 95.11 mL/min (ref >60.00)
GLUCOSE: 73 mg/dL (ref 70–99)
POTASSIUM: 4 meq/L (ref 3.5–5.1)
Sodium: 139 mEq/L (ref 135–145)

## 2013-10-03 LAB — HEMOGLOBIN A1C: Hgb A1c MFr Bld: 6.3 % (ref 4.6–6.5)

## 2013-10-03 LAB — LDL CHOLESTEROL, DIRECT: LDL DIRECT: 151.5 mg/dL

## 2013-10-03 MED ORDER — SPIRONOLACTONE 25 MG PO TABS: 25.0000 mg | ORAL_TABLET | Freq: Every day | ORAL | Status: DC

## 2013-10-03 NOTE — Assessment & Plan Note (Addendum)
She did not tolerate amlodipine due to symptoms of common cold which she developed at the time of initiation of therapy.  Her blood pressure is above goal without it.  Added spironolactone at last visit  And she is tolerating it well.   Lab Results  Component Value Date   CREATININE 0.7 10/03/2013   Lab Results  Component Value Date   NA 139 10/03/2013   K 4.0 10/03/2013   CL 102 10/03/2013   CO2 29 10/03/2013

## 2013-10-03 NOTE — Assessment & Plan Note (Signed)
A1c today is 6.3.  Fasting glucoses have been normal.

## 2013-10-03 NOTE — Patient Instructions (Addendum)
[email or web address]  Has a wonderful selection of compressive garments for use when travelling,  To prevent ankle swelling caused by prolonged sitting  You can take the spironolactone with food to prevent the aftertaste  We will notify you in a few days of the results of your labs from today

## 2013-10-03 NOTE — Progress Notes (Addendum)
Patient ID: Kim Sanchez, female   DOB: [birth year], 73 y.o.   MRN: [id number]  Patient Active Problem List   Diagnosis Date Noted  . Unspecified constipation 06/03/2013  . Medication management 05/12/2013  . Hair loss 05/12/2013  . Hypertension 05/05/2013  . Routine general medical examination at a health care facility 01/16/2013  . Breast cancer, right breast 08/24/2012  . Screening for cervical cancer 01/18/2012  . Screening for colon cancer 01/18/2012  . Arthritis   . GERD (gastroesophageal reflux disease)   . History of blood transfusion   . Hyperlipidemia 11/27/2011  . Crohn's disease     Subjective:  CC:   Chief Complaint  Patient presents with  . Follow-up    blood pressure medication    HPI:   Kim Sanchez is a 73 y.o. female who presents for follow up on blood pressure and elevated A1c.  She did not tolerate amlodipine trial due to runny nose and congestion which she attributed to the medication.   Past Medical History  Diagnosis Date  . Crohn's disease   . Arthritis   . GERD (gastroesophageal reflux disease)   . History of blood transfusion 1960  . 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    Past Surgical History  Procedure Laterality Date  . Dilation and curettage of uterus    . Brain surgery  1980    breast biopsies, benign  . Breast surgery  1980's    fibro cyst both breast in [place]  . Breast lumpectomy Right May 2014    T1c, N0; the R./PR positive, HER-2/neu not overexpressing.       The following portions of the patient's history were reviewed and updated as appropriate: Allergies, current medications, and problem list.    Review of Systems:   Patient denies headache, fevers, malaise, unintentional weight loss, skin rash, eye pain, sinus congestion and sinus pain, sore throat, dysphagia,   hemoptysis , cough, dyspnea, wheezing, chest pain, palpitations, orthopnea, edema, abdominal pain, nausea, melena, diarrhea, constipation, flank pain, dysuria, hematuria, urinary  Frequency, nocturia, numbness, tingling, seizures,  Focal weakness, Loss of consciousness,  Tremor, insomnia, depression, anxiety, and suicidal ideation.     History   Social History  . Marital Status: Married    Spouse Name: N/A    Number of Children: N/A  . Years of Education: N/A   Occupational History  . Not on file.   Social History Main Topics  . Smoking status: Never Smoker   . Smokeless tobacco: Never Used  . Alcohol Use: No  . Drug Use: No  . Sexual Activity: Not Currently   Other Topics Concern  . Not on file   Social History Narrative  . No narrative on file    Objective:  Filed Vitals:   10/03/13 1409  BP: 130/76  Pulse: 84  Temp: 98.4 F (36.9 C)  Resp: 16     General appearance: alert, cooperative and appears stated age Ears: normal TM's and external ear canals both ears Throat: lips, mucosa, and tongue normal; teeth and gums normal Neck: no adenopathy, no carotid bruit, supple, symmetrical, trachea midline and thyroid not enlarged, symmetric, no tenderness/mass/nodules Back: symmetric, no curvature. ROM normal. No CVA tenderness. Lungs: clear to auscultation bilaterally Heart: regular rate and rhythm, S1, S2 normal, no murmur, click, rub or gallop Abdomen: soft, non-tender; bowel sounds normal; no  masses,  no organomegaly Pulses: 2+ and symmetric Skin: Skin color, texture, turgor normal. No rashes or lesions Lymph nodes: Cervical, supraclavicular, and axillary nodes normal.  Assessment and Plan:  Hypertension She did not tolerate amlodipine due to symptoms of common cold which she developed at the time of initiation of therapy.  Her blood pressure is above goal without it.  Added spironolactone at last visit  And she is tolerating it well.   Lab Results  Component  Value Date   CREATININE 0.7 10/03/2013   Lab Results  Component Value Date   NA 139 10/03/2013   K 4.0 10/03/2013   CL 102 10/03/2013   CO2 29 10/03/2013       Hyperglycemia A1c today is 6.3.  Fasting glucoses have been normal.    Updated Medication List Outpatient Encounter Prescriptions as of 10/03/2013  Medication Sig  . anastrozole (ARIMIDEX) 1 MG tablet Take 1 tablet by mouth daily.  . Biotin 10 MG CAPS Take by mouth.  . Calcium Carb-Cholecalciferol (CALCIUM + D3) 600-200 MG-UNIT TABS Take by mouth daily.   . Cholecalciferol (VITAMIN D3) 2000 UNITS TABS Take 1 tablet by mouth daily.  . Fiber CHEW Chew by mouth as needed.  . fish oil-omega-3 fatty acids 1000 MG capsule Take 2 g by mouth daily.  Marland Kitchen ibuprofen (ADVIL,MOTRIN) 200 MG tablet Take 200 mg by mouth every 6 (six) hours as needed.  . inFLIXimab (REMICADE) 100 MG injection Inject 1 mg into the vein every 8 (eight) weeks.  Marland Kitchen LORazepam (ATIVAN) 0.5 MG tablet TAKE 1 TABLET BY MOUTH EVERY DAY AS NEEDED  . Multiple Vitamin (MULTIVITAMIN) tablet Take 1 tablet by mouth daily.  Marland Kitchen nystatin (MYCOSTATIN) 100000 UNIT/ML suspension Take 5 mLs (500,000 Units total) by mouth 4 (four) times daily.  . polyethylene glycol (MIRALAX / GLYCOLAX) packet Take 17 g by mouth daily as needed.   . Red Yeast Rice 600 MG CAPS Take by mouth daily.   Marland Kitchen spironolactone (ALDACTONE) 25 MG tablet Take 1 tablet (25 mg total) by mouth daily.  . Tdap (BOOSTRIX) 5-2.5-18.5 LF-MCG/0.5 injection Inject 0.5 mLs into the muscle once.  . zoster vaccine live, PF, (ZOSTAVAX) 05183 UNT/0.65ML injection Inject 19,400 Units into the skin once.  . [DISCONTINUED] spironolactone (ALDACTONE) 12.5 mg TABS tablet Take 0.5 tablets (12.5 mg total) by mouth daily.  . [DISCONTINUED] spironolactone (ALDACTONE) 12.5 mg TABS tablet Take 25 mg by mouth daily.

## 2013-10-03 NOTE — Progress Notes (Signed)
Pre-visit discussion using our clinic review tool. No additional management support is needed unless otherwise documented below in the visit note.  

## 2013-10-04 ENCOUNTER — Telehealth: Payer: Self-pay | Admitting: Internal Medicine

## 2013-10-04 DIAGNOSIS — K5090 Crohn's disease, unspecified, without complications: Secondary | ICD-10-CM | POA: Diagnosis not present

## 2013-10-04 NOTE — Telephone Encounter (Signed)
Relevant patient education assigned to patient using Emmi. ° °

## 2013-10-05 ENCOUNTER — Ambulatory Visit (INDEPENDENT_AMBULATORY_CARE_PROVIDER_SITE_OTHER): Payer: Medicare Other | Admitting: General Surgery

## 2013-10-05 ENCOUNTER — Encounter: Payer: Self-pay | Admitting: General Surgery

## 2013-10-05 VITALS — BP 154/84 | HR 76 | Resp 12 | Ht 62.5 in | Wt 144.0 lb

## 2013-10-05 DIAGNOSIS — C50919 Malignant neoplasm of unspecified site of unspecified female breast: Secondary | ICD-10-CM | POA: Diagnosis not present

## 2013-10-05 DIAGNOSIS — C50911 Malignant neoplasm of unspecified site of right female breast: Secondary | ICD-10-CM

## 2013-10-05 NOTE — Progress Notes (Signed)
Patient ID: Kim Sanchez, female   DOB: 03-25-1941, 73 y.o.   MRN: [id number]  Chief Complaint  Patient presents with  . Follow-up    mammogram    HPI Kim Sanchez is a 73 y.o. female who presents for a breast evaluation. The most recent mammogram was done on 09/22/13 Patient does perform regular self breast checks and gets regular mammograms done.  No new breast issues.  HPI  Past Medical History  Diagnosis Date  . Crohn's disease   . Arthritis   . GERD (gastroesophageal reflux disease)   . History of blood transfusion 1960  . Hypertension   . 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    Past Surgical History  Procedure Laterality Date  . Dilation and curettage of uterus    . Brain surgery  1980    breast biopsies, benign  . Breast surgery  1980's    fibro cyst both breast in [place]  . Breast lumpectomy Right May 2014    T1c, N0; ER/PR positive, HER-2/neu not overexpressing.    Family History  Problem Relation Age of Onset  . Cancer Paternal Grandmother     Social History History  Substance Use Topics  . Smoking status: Never Smoker   . Smokeless tobacco: Never Used  . Alcohol Use: No    Allergies  Allergen Reactions  . Letrozole Anaphylaxis and Itching  . Exemestane Nausea Only  . Sulfa Antibiotics     Current Outpatient Prescriptions  Medication Sig Dispense Refill  . anastrozole (ARIMIDEX) 1 MG tablet Take 1 tablet by mouth daily.      . Biotin 10 MG CAPS Take by mouth.      . Calcium Carb-Cholecalciferol (CALCIUM + D3) 600-200 MG-UNIT TABS Take by mouth daily.       . Cholecalciferol (VITAMIN D3) 2000 UNITS TABS Take 1 tablet by mouth daily.      . Fiber CHEW Chew by mouth as needed.      . fish oil-omega-3 fatty acids 1000 MG capsule Take 2 g by mouth daily.      Marland Kitchen ibuprofen (ADVIL,MOTRIN) 200 MG  tablet Take 200 mg by mouth every 6 (six) hours as needed.      . inFLIXimab (REMICADE) 100 MG injection Inject 1 mg into the vein every 8 (eight) weeks.      Marland Kitchen LORazepam (ATIVAN) 0.5 MG tablet TAKE 1 TABLET BY MOUTH EVERY DAY AS NEEDED  30 tablet  5  . Multiple Vitamin (MULTIVITAMIN) tablet Take 1 tablet by mouth daily.      Marland Kitchen nystatin (MYCOSTATIN) 100000 UNIT/ML suspension Take 5 mLs (500,000 Units total) by mouth 4 (four) times daily.  120 mL  0  . polyethylene glycol (MIRALAX / GLYCOLAX) packet Take 17 g by mouth daily as needed.       . Red Yeast Rice 600 MG CAPS Take by mouth daily.       Marland Kitchen spironolactone (ALDACTONE) 25 MG tablet Take 1 tablet (25 mg total) by mouth daily.  90 tablet  1   No current facility-administered medications for this visit.    Review of Systems Review of Systems  Blood pressure 154/84, pulse 76, resp. rate 12, height 5' 2.5" (1.588 m), weight 144 lb (65.318 kg).  Physical Exam Physical Exam  Constitutional: She is oriented to person, place, and time. She appears well-developed and  well-nourished.  Neck: Neck supple.  Cardiovascular: Normal rate, regular rhythm and normal heart sounds.   Pulmonary/Chest: Effort normal and breath sounds normal. Right breast exhibits skin change. Right breast exhibits no inverted nipple, no mass, no nipple discharge and no tenderness. Left breast exhibits no inverted nipple, no mass, no nipple discharge, no skin change and no tenderness.  Left > right breast about 1/2 cup size. Incision well healed right breast, nipple hight is symmetrical with faint redness. No lymphedema.  Lymphadenopathy:    She has no cervical adenopathy.    She has no axillary adenopathy.  Neurological: She is alert and oriented to person, place, and time.  Skin: Skin is warm and dry.    Data Reviewed Bilateral mammograms dated Sep 22, 2013 were reviewed. Postsurgical changes are noted in the right breast. BI-RAD-2.  Assessment    Doing well status  post breast conservation. Good tolerance of Aromasin.     Plan    We'll plan for a followup examination in one year with bilateral mammograms at [facility] before that visit.      Follow up in one year with bilateral diagnostic mammogram and office visit    PCP: Tad Moore Fue Cervenka 10/06/2013, 8:52 PM

## 2013-10-05 NOTE — Patient Instructions (Addendum)
Continue self breast exams. Call office for any new breast issues or concerns. Follow up in one year with bilateral diagnostic mammogram and office visit

## 2013-10-06 ENCOUNTER — Encounter: Payer: Self-pay | Admitting: General Surgery

## 2013-11-23 ENCOUNTER — Ambulatory Visit: Payer: Self-pay | Admitting: Radiation Oncology

## 2013-11-24 DIAGNOSIS — C50919 Malignant neoplasm of unspecified site of unspecified female breast: Secondary | ICD-10-CM | POA: Diagnosis not present

## 2013-11-27 DIAGNOSIS — K5090 Crohn's disease, unspecified, without complications: Secondary | ICD-10-CM | POA: Diagnosis not present

## 2013-11-30 DIAGNOSIS — L570 Actinic keratosis: Secondary | ICD-10-CM | POA: Diagnosis not present

## 2013-11-30 DIAGNOSIS — D235 Other benign neoplasm of skin of trunk: Secondary | ICD-10-CM | POA: Diagnosis not present

## 2013-11-30 DIAGNOSIS — D485 Neoplasm of uncertain behavior of skin: Secondary | ICD-10-CM | POA: Diagnosis not present

## 2013-11-30 DIAGNOSIS — D1801 Hemangioma of skin and subcutaneous tissue: Secondary | ICD-10-CM | POA: Diagnosis not present

## 2014-01-05 ENCOUNTER — Other Ambulatory Visit: Payer: Self-pay | Admitting: *Deleted

## 2014-01-05 MED ORDER — NYSTATIN 100000 UNIT/ML MT SUSP: 500000.0000 [IU] | Freq: Four times a day (QID) | OROMUCOSAL | Status: DC

## 2014-01-16 ENCOUNTER — Ambulatory Visit (INDEPENDENT_AMBULATORY_CARE_PROVIDER_SITE_OTHER): Payer: Medicare Other | Admitting: Internal Medicine

## 2014-01-16 ENCOUNTER — Encounter: Payer: Self-pay | Admitting: Internal Medicine

## 2014-01-16 VITALS — BP 148/86 | HR 71 | Temp 98.9°F | Resp 14 | Ht 62.5 in | Wt 142.5 lb

## 2014-01-16 DIAGNOSIS — C50919 Malignant neoplasm of unspecified site of unspecified female breast: Secondary | ICD-10-CM

## 2014-01-16 DIAGNOSIS — K5090 Crohn's disease, unspecified, without complications: Secondary | ICD-10-CM | POA: Diagnosis not present

## 2014-01-16 DIAGNOSIS — B370 Candidal stomatitis: Secondary | ICD-10-CM

## 2014-01-16 DIAGNOSIS — C50911 Malignant neoplasm of unspecified site of right female breast: Secondary | ICD-10-CM

## 2014-01-16 DIAGNOSIS — F409 Phobic anxiety disorder, unspecified: Secondary | ICD-10-CM

## 2014-01-16 DIAGNOSIS — F489 Nonpsychotic mental disorder, unspecified: Secondary | ICD-10-CM

## 2014-01-16 DIAGNOSIS — F5105 Insomnia due to other mental disorder: Secondary | ICD-10-CM

## 2014-01-16 DIAGNOSIS — I10 Essential (primary) hypertension: Secondary | ICD-10-CM | POA: Diagnosis not present

## 2014-01-16 DIAGNOSIS — E785 Hyperlipidemia, unspecified: Secondary | ICD-10-CM

## 2014-01-16 DIAGNOSIS — R7301 Impaired fasting glucose: Secondary | ICD-10-CM

## 2014-01-16 MED ORDER — FLUCONAZOLE 150 MG PO TABS: 150.0000 mg | ORAL_TABLET | Freq: Every day | ORAL | Status: DC

## 2014-01-16 MED ORDER — LOSARTAN POTASSIUM 50 MG PO TABS: 50.0000 mg | ORAL_TABLET | Freq: Every day | ORAL | Status: DC

## 2014-01-16 MED ORDER — ALPRAZOLAM 0.25 MG PO TABS: 0.2500 mg | ORAL_TABLET | Freq: Every evening | ORAL | Status: DC | PRN

## 2014-01-16 MED ORDER — LOSARTAN POTASSIUM 25 MG PO TABS: 25.0000 mg | ORAL_TABLET | Freq: Every day | ORAL | Status: DC

## 2014-01-16 NOTE — Patient Instructions (Addendum)
  I am changing your blood pressure medication to losartan, you can take it once daily morning or evening. If you need a fluid pill after one week, let me know and i will send one to [facility]    i am prescribing one dose of fluconazole to help resolve your thrush   You can try taking melatonin every day around 7 pm to help your sleep cycle naturally  If you do not sleep better with the melatonin you can add the alprazolam (similar to lorazepam but faster acting )  For your acid reflux ,  Try taking zantac 150  Mg up to twice daily or pepcid 10 mg twice daily   Talk to your rheumatologist abut getting the Pneumonia vaccine and the TDaP vaccine

## 2014-01-16 NOTE — Progress Notes (Signed)
Patient ID: Kim Sanchez, female   DOB: Jul 25, 1940, 73 y.o.   MRN: [id number]   Patient Active Problem List   Diagnosis Date Noted  . 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 Oral thrush 01/18/2014  . Impaired fasting blood sugar 01/18/2014  . Hyperglycemia 10/03/2013  . Unspecified constipation 06/03/2013  . Medication management 05/12/2013  . Hair loss 05/12/2013  . Hypertension 05/05/2013  . Routine general medical examination at a health care facility 01/16/2013  . Breast cancer, right breast 08/24/2012  . Screening for cervical cancer 01/18/2012  . Screening for colon cancer 01/18/2012  . Arthritis   . GERD (gastroesophageal reflux disease)   . History of blood transfusion   . Hyperlipidemia 11/27/2011  . Crohn's disease     Subjective:  CC:   Chief Complaint  Patient presents with  . Follow-up    medication review    HPI:   Kim Sanchez is a 73 y.o. female who presents for Follow up on chronic conditions including hypertension, impaired fasting glucose,  Insomnia secondary to anxiety multiple drug intolerances, and Crohn's Disease.  She has had another intolerance issue with the new medication prescribed for hypertension. After tolerating spironolactone for several weeks, she now  feels the spironolactone has caused her to develop thrush.  She has taken a week of nystatin and continues to have odynophagia and white coating on tongue.  She received Remicaide infusion recently.     Past Medical History  Diagnosis Date  . Crohn's disease   . Arthritis   . GERD (gastroesophageal reflux disease)   . History of blood transfusion 1960  . Hypertension   . 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    Past Surgical History  Procedure Laterality Date  . Dilation and curettage of uterus    . Brain surgery  1980     breast biopsies, benign  . Breast surgery  1980's    fibro cyst both breast in [place]  . Breast lumpectomy Right May 2014    T1c, N0; ER/PR positive, HER-2/neu not overexpressing.       The following portions of the patient's history were reviewed and updated as appropriate: Allergies, current medications, and problem list.    Review of Systems:   Patient denies headache, fevers, malaise, unintentional weight loss, skin rash, eye pain, sinus congestion and sinus pain, sore throat, dysphagia,  hemoptysis , cough, dyspnea, wheezing, chest pain, palpitations, orthopnea, edema, abdominal pain, nausea, melena, diarrhea, constipation, flank pain, dysuria, hematuria, urinary  Frequency, nocturia, numbness, tingling, seizures,  Focal weakness, Loss of consciousness,  Tremor, insomnia, depression, anxiety, and suicidal ideation.     History   Social History  . Marital Status: Married    Spouse Name: N/A    Number of Children: N/A  . Years of Education: N/A   Occupational History  . Not on file.   Social History Main Topics  . Smoking status: Never Smoker   . Smokeless tobacco: Never Used  . Alcohol Use: No  . Drug Use: No  . Sexual Activity: Not Currently   Other Topics Concern  . Not on file   Social History Narrative  . No narrative on file    Objective:  Filed Vitals:   01/16/14 1544  BP: 148/86  Pulse: 71  Temp: 98.9 F (37.2 C)  Resp: 14  General appearance: alert, cooperative and appears stated age Ears: normal TM's and external ear canals both ears Throat: lips, mucosa, normal,  tongue has faint white coating not conclusive for thrush ,  teeth and gums normal,  Neck: no adenopathy, no carotid bruit, supple, symmetrical, trachea midline and thyroid not enlarged, symmetric, no tenderness/mass/nodules Back: symmetric, no curvature. ROM normal. No CVA tenderness. Lungs: clear to auscultation bilaterally Heart: regular rate and rhythm, S1, S2 normal, no  murmur, click, rub or gallop Abdomen: soft, non-tender; bowel sounds normal; no masses,  no organomegaly Pulses: 2+ and symmetric Skin: Skin color, texture, turgor normal. No rashes or lesions Lymph nodes: Cervical, supraclavicular, and axillary nodes normal.  Assessment and Plan:  Hypertension Despite discussion of side effect profile she is requsting a change in bp medication .  Trial of losartan   Crohn's disease Managed with remicaide infusions.  Next one is next week at [facility]  Insomnia due to anxiety and fear alprazolam refilled,  Discussed melatonin   Oral thrush Secondary to remicaide.  Fluconazole 150 mg x 2 days.    Hyperlipidemia Managed with red yeast rice.  She is not fasting today. lfts were normal in April she will return for fasting lipids.  Lab Results  Component Value Date   CHOL 263* 05/12/2013   HDL 71.40 05/12/2013   LDLDIRECT 151.5 10/03/2013   TRIG 89.0 05/12/2013   CHOLHDL 4 05/12/2013   Lab Results  Component Value Date   ALT 13 08/18/2013   AST 17 08/18/2013   ALKPHOS 82 08/18/2013   BILITOT 1.0 01/16/2013     Impaired fasting blood sugar Fasting glucose was 123 recently.  a1c is 6.3    A total of 40 minutes was spent with patient more than half of which was spent in counseling patient on the above mentioned issues , reviewing and explaining recent labs  and coordination of care.  Updated Medication List Outpatient Encounter Prescriptions as of 01/16/2014  Medication Sig  . anastrozole (ARIMIDEX) 1 MG tablet Take 1 tablet by mouth daily.  . Biotin 10 MG CAPS Take by mouth.  . Calcium Carb-Cholecalciferol (CALCIUM + D3) 600-200 MG-UNIT TABS Take by mouth daily.   . Cholecalciferol (VITAMIN D3) 2000 UNITS TABS Take 1 tablet by mouth daily.  . Fiber CHEW Chew by mouth as needed.  . fish oil-omega-3 fatty acids 1000 MG capsule Take 2 g by mouth daily.  Marland Kitchen ibuprofen (ADVIL,MOTRIN) 200 MG tablet Take 200 mg by mouth every 6 (six) hours as needed.  . inFLIXimab  (REMICADE) 100 MG injection Inject 1 mg into the vein every 8 (eight) weeks.  . Multiple Vitamin (MULTIVITAMIN) tablet Take 1 tablet by mouth daily.  Marland Kitchen nystatin (MYCOSTATIN) 100000 UNIT/ML suspension Take 5 mLs (500,000 Units total) by mouth 4 (four) times daily.  . polyethylene glycol (MIRALAX / GLYCOLAX) packet Take 17 g by mouth daily as needed.   . Red Yeast Rice 600 MG CAPS Take by mouth daily.   . [DISCONTINUED] LORazepam (ATIVAN) 0.5 MG tablet TAKE 1 TABLET BY MOUTH EVERY DAY AS NEEDED  . [DISCONTINUED] spironolactone (ALDACTONE) 25 MG tablet Take 1 tablet (25 mg total) by mouth daily.  Marland Kitchen ALPRAZolam (XANAX) 0.25 MG tablet Take 1 tablet (0.25 mg total) by mouth at bedtime as needed for anxiety or sleep.  . fluconazole (DIFLUCAN) 150 MG tablet Take 1 tablet (150 mg total) by mouth daily.  Marland Kitchen losartan (COZAAR) 25 MG tablet Take 1 tablet (25 mg total) by mouth daily.  . [  DISCONTINUED] losartan (COZAAR) 50 MG tablet Take 1 tablet (50 mg total) by mouth daily.     Orders Placed This Encounter  Procedures  . CBC and differential  . Hepatic function panel    Return in about 3 months (around 04/17/2014).

## 2014-01-16 NOTE — Progress Notes (Signed)
Pre-visit discussion using our clinic review tool. No additional management support is needed unless otherwise documented below in the visit note.  

## 2014-01-18 ENCOUNTER — Encounter: Payer: Self-pay | Admitting: Internal Medicine

## 2014-01-18 DIAGNOSIS — F5105 Insomnia due to other mental disorder: Secondary | ICD-10-CM

## 2014-01-18 DIAGNOSIS — F409 Phobic anxiety disorder, unspecified: Secondary | ICD-10-CM | POA: Insufficient documentation

## 2014-01-18 DIAGNOSIS — E1169 Type 2 diabetes mellitus with other specified complication: Secondary | ICD-10-CM | POA: Insufficient documentation

## 2014-01-18 DIAGNOSIS — E782 Mixed hyperlipidemia: Secondary | ICD-10-CM

## 2014-01-18 DIAGNOSIS — B370 Candidal stomatitis: Secondary | ICD-10-CM | POA: Insufficient documentation

## 2014-01-18 DIAGNOSIS — E119 Type 2 diabetes mellitus without complications: Secondary | ICD-10-CM | POA: Insufficient documentation

## 2014-01-18 NOTE — Assessment & Plan Note (Signed)
Managed with remicaide infusions.  Next one is next week at [facility]

## 2014-01-18 NOTE — Assessment & Plan Note (Signed)
alprazolam refilled,  Discussed melatonin

## 2014-01-18 NOTE — Assessment & Plan Note (Signed)
Secondary to remicaide.  Fluconazole 150 mg x 2 days.

## 2014-01-18 NOTE — Assessment & Plan Note (Signed)
Despite discussion of side effect profile she is requsting a change in bp medication .  Trial of losartan

## 2014-01-18 NOTE — Assessment & Plan Note (Signed)
Fasting glucose was 123 recently.  a1c is 6.3

## 2014-01-18 NOTE — Assessment & Plan Note (Addendum)
Managed with red yeast rice.  She is not fasting today. lfts were normal in April she will return for fasting lipids.  Lab Results  Component Value Date   CHOL 263* 05/12/2013   HDL 71.40 05/12/2013   LDLDIRECT 151.5 10/03/2013   TRIG 89.0 05/12/2013   CHOLHDL 4 05/12/2013   Lab Results  Component Value Date   ALT 13 08/18/2013   AST 17 08/18/2013   ALKPHOS 82 08/18/2013   BILITOT 1.0 01/16/2013

## 2014-01-19 ENCOUNTER — Encounter: Payer: Self-pay | Admitting: Internal Medicine

## 2014-01-23 ENCOUNTER — Ambulatory Visit: Payer: Self-pay | Admitting: Oncology

## 2014-01-23 DIAGNOSIS — N951 Menopausal and female climacteric states: Secondary | ICD-10-CM | POA: Diagnosis not present

## 2014-01-23 DIAGNOSIS — Z780 Asymptomatic menopausal state: Secondary | ICD-10-CM | POA: Diagnosis not present

## 2014-01-23 DIAGNOSIS — Z13820 Encounter for screening for osteoporosis: Secondary | ICD-10-CM | POA: Diagnosis not present

## 2014-01-25 DIAGNOSIS — K5090 Crohn's disease, unspecified, without complications: Secondary | ICD-10-CM | POA: Diagnosis not present

## 2014-01-26 ENCOUNTER — Ambulatory Visit: Payer: Self-pay | Admitting: Oncology

## 2014-01-26 DIAGNOSIS — D485 Neoplasm of uncertain behavior of skin: Secondary | ICD-10-CM | POA: Diagnosis not present

## 2014-01-26 DIAGNOSIS — K219 Gastro-esophageal reflux disease without esophagitis: Secondary | ICD-10-CM | POA: Diagnosis not present

## 2014-01-26 DIAGNOSIS — C50919 Malignant neoplasm of unspecified site of unspecified female breast: Secondary | ICD-10-CM | POA: Diagnosis not present

## 2014-01-26 DIAGNOSIS — D2330 Other benign neoplasm of skin of unspecified part of face: Secondary | ICD-10-CM | POA: Diagnosis not present

## 2014-01-26 DIAGNOSIS — D2310 Other benign neoplasm of skin of unspecified eyelid, including canthus: Secondary | ICD-10-CM | POA: Diagnosis not present

## 2014-01-26 DIAGNOSIS — Z170 Estrogen receptor positive status [ER+]: Secondary | ICD-10-CM | POA: Diagnosis not present

## 2014-01-26 DIAGNOSIS — Z79899 Other long term (current) drug therapy: Secondary | ICD-10-CM | POA: Diagnosis not present

## 2014-01-26 DIAGNOSIS — M129 Arthropathy, unspecified: Secondary | ICD-10-CM | POA: Diagnosis not present

## 2014-01-26 DIAGNOSIS — Z79811 Long term (current) use of aromatase inhibitors: Secondary | ICD-10-CM | POA: Diagnosis not present

## 2014-02-01 ENCOUNTER — Ambulatory Visit: Payer: Self-pay | Admitting: Oncology

## 2014-02-14 DIAGNOSIS — Z23 Encounter for immunization: Secondary | ICD-10-CM | POA: Diagnosis not present

## 2014-02-20 ENCOUNTER — Ambulatory Visit: Payer: Medicare Other | Admitting: Internal Medicine

## 2014-02-27 DIAGNOSIS — J069 Acute upper respiratory infection, unspecified: Secondary | ICD-10-CM | POA: Diagnosis not present

## 2014-03-05 ENCOUNTER — Encounter: Payer: Self-pay | Admitting: Internal Medicine

## 2014-03-20 DIAGNOSIS — K5090 Crohn's disease, unspecified, without complications: Secondary | ICD-10-CM | POA: Diagnosis not present

## 2014-03-21 ENCOUNTER — Encounter: Payer: Self-pay | Admitting: Family Medicine

## 2014-03-21 ENCOUNTER — Telehealth: Payer: Self-pay | Admitting: Internal Medicine

## 2014-03-21 ENCOUNTER — Ambulatory Visit (INDEPENDENT_AMBULATORY_CARE_PROVIDER_SITE_OTHER): Payer: Medicare Other | Admitting: Family Medicine

## 2014-03-21 VITALS — BP 160/82 | HR 80 | Temp 98.2°F | Wt 142.8 lb

## 2014-03-21 DIAGNOSIS — I10 Essential (primary) hypertension: Secondary | ICD-10-CM | POA: Diagnosis not present

## 2014-03-21 DIAGNOSIS — I160 Hypertensive urgency: Secondary | ICD-10-CM | POA: Insufficient documentation

## 2014-03-21 MED ORDER — CLONIDINE HCL 0.1 MG PO TABS
0.1000 mg | ORAL_TABLET | Freq: Once | ORAL | Status: AC
  Administered 2014-03-21: 0.1 mg via ORAL

## 2014-03-21 MED ORDER — AMLODIPINE BESYLATE 2.5 MG PO TABS: 2.5000 mg | ORAL_TABLET | Freq: Every day | ORAL | Status: DC

## 2014-03-21 MED ORDER — HYDROCHLOROTHIAZIDE 12.5 MG PO CAPS: 12.5000 mg | ORAL_CAPSULE | Freq: Every day | ORAL | Status: DC

## 2014-03-21 NOTE — Assessment & Plan Note (Addendum)
No signs today of end organ damage. Anticipate elevated readings this week due to recent accidental increase in sodium in diet. Clonidine 0.4m provided in office today. bp in office 200/100 --> 160/82 after clonidine. Intolerance to several meds in the past however pt feels losartan is ineffective currently and agrees to retrial prior meds. Will stop losartan 229m start amlodipine 2.80m41mnd hctz 12.80mg880mily. rec f/u with PCP in 2 wks. Advised to continue to monitor bp at home and notify us sKoreaner if bp persistently >150/90 despite new meds. Pt agrees with plan.

## 2014-03-21 NOTE — Progress Notes (Signed)
BP 160/82 mmHg  Pulse 80  Temp(Src) 98.2 F (36.8 C) (Oral)  Wt 142 lb 12 oz (64.751 kg)   CC: check BP  Subjective:    Patient ID: Kim Sanchez, female    DOB: December 11, 1940, 73 y.o.   MRN: [id number]  HPI: Kim Sanchez is a 73 y.o. female presenting on 03/21/2014 for Hypertension and Edema   HTN urgency - has been on losartan 50m daily since 01/2014. Started feeling ill so she cut this in half.  As of 03/17/2014 she started noticing high blood pressure so she increased back to 270mdaily. Since then, bp has remained elevated. Does check blood pressures at home: today 193/100 while sitting at home, several days prior to this 140s/70 (feels well at this range).   Endorses some hand swelling, mild headache, nosebleed. Feels ARB may be causing cough as well as fatigue. No vision changes, CP/tightness, SOB. No tongue or lip swelling. Denies palpitations, flushed feeling or diaphoresis.  She has tried other antihypertensive medications in the past including hydrochlorothiazide (?am nausea), amlodipine (?URI sxs), lisinopril (cough)  URI end of October treated with zpack. She did take losartan 2527mhis morning.  Did have JapLebanonod last week which tasted salty as well as chicken soup that was salty Sunday again lima beans yesterday that tasted salty. May have unknowingly had increased sodium in diet.   BP Readings from Last 3 Encounters:  03/21/14 160/82  01/16/14 148/86  10/05/13 154/84   Lab Results  Component Value Date   CREATININE 0.7 10/03/2013    Relevant past medical, surgical, family and social history reviewed and updated as indicated.  Allergies and medications reviewed and updated. Current Outpatient Prescriptions on File Prior to Visit  Medication Sig  . anastrozole (ARIMIDEX) 1 MG tablet Take 1 tablet by mouth daily.  . Biotin 10 MG CAPS Take by mouth.  . Calcium Carb-Cholecalciferol (CALCIUM + D3) 600-200 MG-UNIT TABS Take by mouth daily.   . Cholecalciferol  (VITAMIN D3) 2000 UNITS TABS Take 1 tablet by mouth daily.  . Fiber CHEW Chew by mouth as needed.  . fish oil-omega-3 fatty acids 1000 MG capsule Take 2 g by mouth daily.  . iMarland Kitchenuprofen (ADVIL,MOTRIN) 200 MG tablet Take 200 mg by mouth every 6 (six) hours as needed.  . inFLIXimab (REMICADE) 100 MG injection Inject 1 mg into the vein every 8 (eight) weeks.  . Multiple Vitamin (MULTIVITAMIN) tablet Take 1 tablet by mouth daily.  . nMarland Kitchenstatin (MYCOSTATIN) 100000 UNIT/ML suspension Take 5 mLs (500,000 Units total) by mouth 4 (four) times daily.  . polyethylene glycol (MIRALAX / GLYCOLAX) packet Take 17 g by mouth daily as needed.   . Red Yeast Rice 600 MG CAPS Take by mouth daily.   . AMarland KitchenPRAZolam (XANAX) 0.25 MG tablet Take 1 tablet (0.25 mg total) by mouth at bedtime as needed for anxiety or sleep.   No current facility-administered medications on file prior to visit.    Review of Systems Per HPI unless specifically indicated above    Objective:    BP 160/82 mmHg  Pulse 80  Temp(Src) 98.2 F (36.8 C) (Oral)  Wt 142 lb 12 oz (64.751 kg)  Physical Exam  Constitutional: She appears well-developed and well-nourished. No distress.  HENT:  Mouth/Throat: Oropharynx is clear and moist. No oropharyngeal exudate.  Cardiovascular: Normal rate, regular rhythm, normal heart sounds and intact distal pulses.   No murmur heard. Pulmonary/Chest: Effort normal and breath sounds normal. No respiratory distress. She  has no wheezes. She has no rales.  Musculoskeletal: She exhibits no edema.  Neurological: No cranial nerve deficit. Coordination and gait normal.  Psychiatric: She has a normal mood and affect.  Nursing note and vitals reviewed.     Assessment & Plan:   Problem List Items Addressed This Visit    Hypertensive urgency - Primary    No signs today of end organ damage. Anticipate elevated readings this week due to recent accidental increase in sodium in diet. Clonidine 0.46m provided in office  today. bp in office 200/100 --> 160/82 after clonidine. Intolerance to several meds in the past however pt feels losartan is ineffective currently and agrees to retrial prior meds. Will stop losartan 274m start amlodipine 2.69m73mnd hctz 12.69mg23mily. rec f/u with PCP in 2 wks. Advised to continue to monitor bp at home and notify us sKoreaner if bp persistently >150/90 despite new meds. Pt agrees with plan.    Relevant Medications      hydrochlorothiazide (MICROZIDE) 12.5 MG capsule      amLODIpine (NORVASC)  tablet      cloNIDine (CATAPRES) tablet 0.1 mg (Completed)       Follow up plan: Return in about 2 weeks (around 04/04/2014), or with PCP.

## 2014-03-21 NOTE — Telephone Encounter (Signed)
Patient Information:  Caller Name: Kim Sanchez  Phone: [phone]  Patient: Kim Sanchez  Gender: Female  DOB: March 08, 1941  Age: 73 Years  PCP: Deborra Medina (Adults only)  Office Follow Up:  Does the office need to follow up with this patient?: No  Instructions For The Office: N/A  RN Note:  Pt reports was taking half dose of Losartan until 03/17/14 because of malaise; felt "draggy and short of breath." Agreed to be seen for hypertension.  No appointments available at [facility].  Scheduled for 1145 03/21/14 with Dr. Danise Mina at [facility] office.   Symptoms  Reason For Call & Symptoms: Hypertension.  BP 193/100 left arm sitting at 1005.  Reviewed Health History In EMR: Yes  Reviewed Medications In EMR: Yes  Reviewed Allergies In EMR: Yes  Reviewed Surgeries / Procedures: Yes  Date of Onset of Symptoms: 03/21/2014  Treatments Tried: Took Losartan dose  Treatments Tried Worked: No  Guideline(s) Used:  High Blood Pressure  Disposition Per Guideline:   See Today in Office  Reason For Disposition Reached:   BP > 180/110  Advice Given:  General:  Untreated high blood pressure may cause damage to the heart, brain, kidneys, and eyes.  Treatment of high blood pressure can reduce the risk of stroke, heart attack, and heart failure.  The goal of blood pressure treatment for most patients with hypertension is to keep the blood pressure under 140/90.  Call Back If:  Headache, blurred vision, difficulty talking, or difficulty walking occurs  Chest pain or difficulty breathing occurs  You want to go in to the office for a blood pressure check  You become worse.  Patient Will Follow Care Advice:  YES  Appointment Scheduled:  03/21/2014 11:45:00 Appointment Scheduled Provider:  Other

## 2014-03-21 NOTE — Patient Instructions (Addendum)
Stop losartan. Start amlodipine 2.66m and hydrochlorothiazide 12.5101monce daily. Continue to monitor blood pressures at home and return in 1-2 weeks with Dr TuDerrel Nip Work on low salt/sodium diet - goal <1.5gm (1,50014mper day. Eat a diet high in fruits/vegetables and whole grains.   Look into mediterranean diet. DASH diet handout provided. Look at wwwElm Springsg for more resources  DASH Eating Plan DASH stands for "Dietary Approaches to Stop Hypertension." The DASH eating plan is a healthy eating plan that has been shown to reduce high blood pressure (hypertension). Additional health benefits may include reducing the risk of type 2 diabetes mellitus, heart disease, and stroke. The DASH eating plan may also help with weight loss. WHAT DO I NEED TO KNOW ABOUT THE DASH EATING PLAN? For the DASH eating plan, you will follow these general guidelines:  Choose foods with a percent daily value for sodium of less than 5% (as listed on the food label).  Use salt-free seasonings or herbs instead of table salt or sea salt.  Check with your health care provider or pharmacist before using salt substitutes.  Eat lower-sodium products, often labeled as "lower sodium" or "no salt added."  Eat fresh foods.  Eat more vegetables, fruits, and low-fat dairy products.  Choose whole grains. Look for the word "whole" as the first word in the ingredient list.  Choose fish and skinless chicken or tur[place] often than red meat. Limit fish, poultry, and meat to 6 oz (170 g) each day.  Limit sweets, desserts, sugars, and sugary drinks.  Choose heart-healthy fats.  Limit cheese to 1 oz (28 g) per day.  Eat more home-cooked food and less restaurant, buffet, and fast food.  Limit fried foods.  Cook foods using methods other than frying.  Limit canned vegetables. If you do use them, rinse them well to decrease the sodium.  When eating at a restaurant, ask that your food be prepared with less salt, or no  salt if possible. WHAT FOODS CAN I EAT? Seek help from a dietitian for individual calorie needs. Grains Whole grain or whole wheat bread. Brown rice. Whole grain or whole wheat pasta. Quinoa, bulgur, and whole grain cereals. Low-sodium cereals. Corn or whole wheat flour tortillas. Whole grain cornbread. Whole grain crackers. Low-sodium crackers. Vegetables Fresh or frozen vegetables (raw, steamed, roasted, or grilled). Low-sodium or reduced-sodium tomato and vegetable juices. Low-sodium or reduced-sodium tomato sauce and paste. Low-sodium or reduced-sodium canned vegetables.  Fruits All fresh, canned (in natural juice), or frozen fruits. Meat and Other Protein Products Ground beef (85% or leaner), grass-fed beef, or beef trimmed of fat. Skinless chicken or [place] chicken or [place] trimmed of fat. All fish and seafood. Eggs. Dried beans, peas, or lentils. Unsalted nuts and seeds. Unsalted canned beans. Dairy Low-fat dairy products, such as skim or 1% milk, 2% or reduced-fat cheeses, low-fat ricotta or cottage cheese, or plain low-fat yogurt. Low-sodium or reduced-sodium cheeses. Fats and Oils Tub margarines without trans fats. Light or reduced-fat mayonnaise and salad dressings (reduced sodium). Avocado. Safflower, olive, or canola oils. Natural peanut or almond butter. Other Unsalted popcorn and pretzels. The items listed above may not be a complete list of recommended foods or beverages. Contact your dietitian for more options. WHAT FOODS ARE NOT RECOMMENDED? Grains White bread. White pasta. White rice. Refined cornbread. Bagels and croissants. Crackers that contain trans fat. Vegetables Creamed or fried vegetables. Vegetables in a cheese sauce. Regular canned vegetables. Regular canned tomato sauce and paste. Regular tomato  and vegetable juices. Fruits Dried fruits. Canned fruit in light or heavy syrup. Fruit juice. Meat and Other Protein Products Fatty cuts of meat. Ribs,  chicken wings, bacon, sausage, bologna, salami, chitterlings, fatback, hot dogs, bratwurst, and packaged luncheon meats. Salted nuts and seeds. Canned beans with salt. Dairy Whole or 2% milk, cream, half-and-half, and cream cheese. Whole-fat or sweetened yogurt. Full-fat cheeses or blue cheese. Nondairy creamers and whipped toppings. Processed cheese, cheese spreads, or cheese curds. Condiments Onion and garlic salt, seasoned salt, table salt, and sea salt. Canned and packaged gravies. Worcestershire sauce. Tartar sauce. Barbecue sauce. Teriyaki sauce. Soy sauce, including reduced sodium. Steak sauce. Fish sauce. Oyster sauce. Cocktail sauce. Horseradish. Ketchup and mustard. Meat flavorings and tenderizers. Bouillon cubes. Hot sauce. Tabasco sauce. Marinades. Taco seasonings. Relishes. Fats and Oils Butter, stick margarine, lard, shortening, ghee, and bacon fat. Coconut, palm kernel, or palm oils. Regular salad dressings. Other Pickles and olives. Salted popcorn and pretzels. The items listed above may not be a complete list of foods and beverages to avoid. Contact your dietitian for more information. WHERE CAN I FIND MORE INFORMATION? National Heart, Lung, and Blood Institute: travelstabloid.com Document Released: 04/09/2011 Document Revised: 09/04/2013 Document Reviewed: 02/22/2013 [facility] Patient Information 2015 [place], [place]. This information is not intended to replace advice given to you by your health care provider. Make sure you discuss any questions you have with your health care provider.

## 2014-03-21 NOTE — Telephone Encounter (Signed)
fyi

## 2014-03-21 NOTE — Progress Notes (Signed)
Pre visit review using our clinic review tool, if applicable. No additional management support is needed unless otherwise documented below in the visit note. 

## 2014-03-23 ENCOUNTER — Telehealth: Payer: Self-pay | Admitting: Internal Medicine

## 2014-03-23 DIAGNOSIS — I38 Endocarditis, valve unspecified: Secondary | ICD-10-CM | POA: Diagnosis not present

## 2014-03-23 DIAGNOSIS — R1013 Epigastric pain: Secondary | ICD-10-CM | POA: Diagnosis not present

## 2014-03-23 DIAGNOSIS — I10 Essential (primary) hypertension: Secondary | ICD-10-CM | POA: Diagnosis not present

## 2014-03-23 DIAGNOSIS — K219 Gastro-esophageal reflux disease without esophagitis: Secondary | ICD-10-CM | POA: Diagnosis not present

## 2014-03-23 NOTE — Telephone Encounter (Signed)
Kim Sanchez called saying she saw Dr. Danise Mina at [facility] last week concerning her BP. She said Dr. Danise Mina strongly suggested she follow-up with her PCP no later than Dec. 3rd. She's wondering if she can be seen here. Please call the pt she's extremely concerned.  Pt ph# [phone] Thank you.

## 2014-03-23 NOTE — Telephone Encounter (Signed)
Patient seen by Dr. Danise Mina on 03/21/14 for hypertension. Dr. Danise Mina recommended patient follow up with PCP in 2wks. No appts available with PCP until 04/26/14 at 11:15am. Please advise.

## 2014-03-24 NOTE — Telephone Encounter (Signed)
You should not put her on carrie's schedule because she has a history of intolerance to multiple blood pressure medications. 1:00 Dec 9th

## 2014-03-26 NOTE — Telephone Encounter (Signed)
Please call and schedule patient. Thanks!

## 2014-03-26 NOTE — Telephone Encounter (Signed)
Pt scheduled, thanks!

## 2014-04-02 ENCOUNTER — Other Ambulatory Visit: Payer: Self-pay | Admitting: Internal Medicine

## 2014-04-02 DIAGNOSIS — G4700 Insomnia, unspecified: Secondary | ICD-10-CM

## 2014-04-02 NOTE — Telephone Encounter (Signed)
Please check with Ms Leisner re which one she is preferring,  She cannot use both

## 2014-04-02 NOTE — Telephone Encounter (Signed)
Spoke to patient, she is not using Xanax, has been using Lorazepam.

## 2014-04-02 NOTE — Telephone Encounter (Signed)
Refill? Xanax filled most recently

## 2014-04-03 NOTE — Telephone Encounter (Signed)
Ok to refill,  printed rx  

## 2014-04-04 ENCOUNTER — Ambulatory Visit: Payer: Medicare Other | Admitting: Nurse Practitioner

## 2014-04-04 NOTE — Telephone Encounter (Signed)
Called to pharmacy 

## 2014-04-11 ENCOUNTER — Ambulatory Visit (INDEPENDENT_AMBULATORY_CARE_PROVIDER_SITE_OTHER): Payer: Medicare Other | Admitting: Internal Medicine

## 2014-04-11 VITALS — BP 146/84 | HR 85 | Temp 98.0°F | Resp 16 | Ht 62.5 in | Wt 138.2 lb

## 2014-04-11 DIAGNOSIS — R739 Hyperglycemia, unspecified: Secondary | ICD-10-CM

## 2014-04-11 DIAGNOSIS — I10 Essential (primary) hypertension: Secondary | ICD-10-CM

## 2014-04-11 DIAGNOSIS — I160 Hypertensive urgency: Secondary | ICD-10-CM

## 2014-04-11 LAB — COMPREHENSIVE METABOLIC PANEL
ALT: 18 U/L (ref 0–35)
AST: 21 U/L (ref 0–37)
Albumin: 4.2 g/dL (ref 3.5–5.2)
Alkaline Phosphatase: 98 U/L (ref 39–117)
BUN: 14 mg/dL (ref 6–23)
CALCIUM: 9.5 mg/dL (ref 8.4–10.5)
CHLORIDE: 98 meq/L (ref 96–112)
CO2: 28 mEq/L (ref 19–32)
Creatinine, Ser: 0.6 mg/dL (ref 0.4–1.2)
GFR: 98.46 mL/min (ref >60.00)
Glucose, Bld: 123 mg/dL — ABNORMAL HIGH (ref 70–99)
Potassium: 3.9 mEq/L (ref 3.5–5.1)
SODIUM: 135 meq/L (ref 135–145)
TOTAL PROTEIN: 7.6 g/dL (ref 6.0–8.3)
Total Bilirubin: 0.8 mg/dL (ref 0.2–1.2)

## 2014-04-11 LAB — MICROALBUMIN / CREATININE URINE RATIO
Creatinine,U: 54.7 mg/dL
Microalb Creat Ratio: 0.7 mg/g (ref 0.0–30.0)
Microalb, Ur: 0.4 mg/dL (ref 0.0–1.9)

## 2014-04-11 LAB — HEMOGLOBIN A1C: Hgb A1c MFr Bld: 6.6 % — ABNORMAL HIGH (ref 4.6–6.5)

## 2014-04-11 MED ORDER — ALPRAZOLAM 0.25 MG PO TABS: 0.2500 mg | ORAL_TABLET | Freq: Every evening | ORAL | Status: DC | PRN

## 2014-04-11 NOTE — Patient Instructions (Addendum)
I agree with the changes to your blood pressure medications and will refill the amlodipin at 5 mg daily dose.  Your ibuprofen may be raising your blood pressure . Keep track of the  Association,,  Try Salon Pas patches for your neck pain and tylenol   If your home blood pressures are consistently 140/80 or less at home,  We do not need to make any more adjustments    For your constipation, try increasing your  Fiber daily  With high fiber breads and vegetables. (up to 35 grams is recommended)  .  It is ok to use your herbal  tea every 3 days to relieve constipation  [place] makes a low carb whole wheat tortilla that has two sizes and loaded with fiber ([employer])     I recommend getting the majority of your calcium and Vitamin D  through diet rather than supplements given the recent association of calcium supplements with increased coronary artery calcium scores (You need 1200 mg daily )   Unsweetened almond/coconut milk is a great low calorie low carb, cholesterol free  way to increase your dietary calcium and vitamin D.  Try the blue Jackquline Bosch,  Or Silk.      The legs aching may be coming  from the [employer].  If it does return however, let me know.

## 2014-04-11 NOTE — Progress Notes (Signed)
Patient ID: Kim Sanchez, female   DOB: August 13, 1940, 73 y.o.   MRN: [id number]   Patient Active Problem List   Diagnosis Date Noted  . Hypertensive urgency 03/21/2014  . 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 Oral thrush 01/18/2014  . Impaired fasting blood sugar 01/18/2014  . Hyperglycemia 10/03/2013  . Unspecified constipation 06/03/2013  . Medication management 05/12/2013  . Hair loss 05/12/2013  . Hypertension 05/05/2013  . Routine general medical examination at a health care facility 01/16/2013  . Screening for cervical cancer 01/18/2012  . Screening for colon cancer 01/18/2012  . Arthritis   . GERD (gastroesophageal reflux disease)   . History of blood transfusion   . Hyperlipidemia 11/27/2011  . Crohn's disease     Subjective:  CC:   Chief Complaint  Patient presents with  . Follow-up    HPI:   Kim Sanchez is a 73 y.o. female who presents for  Follow up on hypertension , uncontrolled complicated by multiple drug intolerances . She was seen on nov 18th with hypertension urgency by MD at [facility].    Clonidine  0.54m was given  For bp in office 200/100 --> 160/82 after clonidine. Her  history of Intolerance to several meds in the past was reviewed .  pt felt that losartan was ineffective  and agreed to retrial  Of prior meds.  The losartan 257mwas stoppeed and Dr G Darnell Leveltarted amlodipine 2.22m39mnd hctz 12.22mg62mily.   [facility] amlodipine to 5 mg daily and continued the hctz at 12.5 mg daily    Has had multipel side effects to report including excessive  Belching   Feeling jittery inside,  And legs aching.  Legs stopped aching  whe she stopped RYR.    Today her bp is elevated and her home bps have been less than 160 673tolic     Past Medical History  Diagnosis Date  . Crohn's disease   . Arthritis   . GERD (gastroesophageal reflux disease)   . History of blood transfusion 1960  . Hypertension    . 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    Past Surgical History  Procedure Laterality Date  . Dilation and curettage of uterus    . Brain surgery  1980    breast biopsies, benign  . Breast surgery  1980's    fibro cyst both breast in [place]  . Breast lumpectomy Right May 2014    T1c, N0; ER/PR positive, HER-2/neu not overexpressing.       The following portions of the patient's history were reviewed and updated as appropriate: Allergies, current medications, and problem list.    Review of Systems:   Patient denies headache, fevers, malaise, unintentional weight loss, skin rash, eye pain, sinus congestion and sinus pain, sore throat, dysphagia,  hemoptysis , cough, dyspnea, wheezing, chest pain, palpitations, orthopnea, edema, abdominal pain, nausea, melena, diarrhea, constipation, flank pain, dysuria, hematuria, urinary  Frequency, nocturia, numbness, tingling, seizures,  Focal weakness, Loss of consciousness,  Tremor, insomnia, depression, anxiety, and suicidal ideation.     History   Social History  . Marital Status: Married    Spouse Name: N/A    Number of Children: N/A  . Years of Education: N/A   Occupational History  . Not  on file.   Social History Main Topics  . Smoking status: Never Smoker   . Smokeless tobacco: Never Used  . Alcohol Use: No  . Drug Use: No  . Sexual Activity: Not Currently   Other Topics Concern  . Not on file   Social History Narrative    Objective:  Filed Vitals:   04/11/14 1259  BP: 146/84  Pulse: 85  Temp: 98 F (36.7 C)  Resp: 16     General appearance: alert, cooperative and appears stated age Ears: normal TM's and external ear canals both ears Throat: lips, mucosa, and tongue normal; teeth and gums normal Neck: no adenopathy, no carotid bruit, supple,  symmetrical, trachea midline and thyroid not enlarged, symmetric, no tenderness/mass/nodules Back: symmetric, no curvature. ROM normal. No CVA tenderness. Lungs: clear to auscultation bilaterally Heart: regular rate and rhythm, S1, S2 normal, no murmur, click, rub or gallop Abdomen: soft, non-tender; bowel sounds normal; no masses,  no organomegaly Pulses: 2+ and symmetric Skin: Skin color, texture, turgor normal. No rashes or lesions Lymph nodes: Cervical, supraclavicular, and axillary nodes normal.  Assessment and Plan:  Hypertension Long discussion with patient about her history of intolerances and the reactions she has had.  Patient advised to continue 5 mg amlodipine and 12. 5 mg hctz.  A total of 25 minutes of face to face time was spent with patient more than half of which was spent in counselling and coordination of care    Updated Medication List Outpatient Encounter Prescriptions as of 04/11/2014  Medication Sig  . amLODipine (NORVASC) 2.5 MG tablet Take 1 tablet (2.5 mg total) by mouth daily.  Marland Kitchen anastrozole (ARIMIDEX) 1 MG tablet Take 1 tablet by mouth daily.  . Biotin 10 MG CAPS Take by mouth.  . Calcium Carb-Cholecalciferol (CALCIUM + D3) 600-200 MG-UNIT TABS Take by mouth daily.   . Cholecalciferol (VITAMIN D3) 2000 UNITS TABS Take 1 tablet by mouth daily.  . Fiber CHEW Chew by mouth as needed.  . fish oil-omega-3 fatty acids 1000 MG capsule Take 2 g by mouth daily.  . hydrochlorothiazide (MICROZIDE) 12.5 MG capsule Take 1 capsule (12.5 mg total) by mouth daily.  Marland Kitchen ibuprofen (ADVIL,MOTRIN) 200 MG tablet Take 200 mg by mouth every 6 (six) hours as needed.  . inFLIXimab (REMICADE) 100 MG injection Inject 1 mg into the vein every 8 (eight) weeks.  Marland Kitchen LORazepam (ATIVAN) 0.5 MG tablet TAKE 1 TABLET BY MOUTH EVERY DAY AS NEEDED  . Multiple Vitamin (MULTIVITAMIN) tablet Take 1 tablet by mouth daily.  Marland Kitchen nystatin (MYCOSTATIN) 100000 UNIT/ML suspension Take 5 mLs (500,000 Units  total) by mouth 4 (four) times daily.  . polyethylene glycol (MIRALAX / GLYCOLAX) packet Take 17 g by mouth daily as needed.   . Red Yeast Rice 600 MG CAPS Take by mouth daily.   Marland Kitchen ALPRAZolam (XANAX) 0.25 MG tablet Take 1 tablet (0.25 mg total) by mouth at bedtime as needed for anxiety or sleep.  . [DISCONTINUED] ALPRAZolam (XANAX) 0.25 MG tablet Take 1 tablet (0.25 mg total) by mouth at bedtime as needed for anxiety or sleep. (Patient not taking: Reported on 04/11/2014)     Orders Placed This Encounter  Procedures  . Comprehensive metabolic panel  . Hemoglobin A1c  . Microalbumin / creatinine urine ratio    No Follow-up on file.

## 2014-04-11 NOTE — Progress Notes (Signed)
Pre-visit discussion using our clinic review tool. No additional management support is needed unless otherwise documented below in the visit note.  

## 2014-04-12 ENCOUNTER — Encounter: Payer: Self-pay | Admitting: Internal Medicine

## 2014-04-12 NOTE — Assessment & Plan Note (Addendum)
Long discussion with patient about her history of intolerances and the reactions she has had.  Patient advised to continue 5 mg amlodipine and 12. 5 mg hctz.

## 2014-05-02 ENCOUNTER — Encounter: Payer: Self-pay | Admitting: *Deleted

## 2014-05-02 ENCOUNTER — Telehealth: Payer: Self-pay | Admitting: Internal Medicine

## 2014-05-02 MED ORDER — AMLODIPINE BESYLATE 2.5 MG PO TABS: 2.5000 mg | ORAL_TABLET | Freq: Two times a day (BID) | ORAL | Status: DC

## 2014-05-02 NOTE — Telephone Encounter (Signed)
rx for 2.5 mg bid sent to pharmacy  #60/month

## 2014-05-02 NOTE — Telephone Encounter (Signed)
Patient taking 2.5 Mg amlodipine twice daily ok to change sig, and refill with 30 day supply? 12/9/ 15 visit you agreed to 5 mg daily.

## 2014-05-08 DIAGNOSIS — K5000 Crohn's disease of small intestine without complications: Secondary | ICD-10-CM | POA: Diagnosis not present

## 2014-05-15 ENCOUNTER — Other Ambulatory Visit: Payer: Self-pay | Admitting: Family Medicine

## 2014-05-16 NOTE — Telephone Encounter (Signed)
Mailed unread message to pt  

## 2014-05-19 ENCOUNTER — Other Ambulatory Visit: Payer: Self-pay | Admitting: Internal Medicine

## 2014-05-30 DIAGNOSIS — K5090 Crohn's disease, unspecified, without complications: Secondary | ICD-10-CM | POA: Diagnosis not present

## 2014-05-31 ENCOUNTER — Ambulatory Visit: Payer: Self-pay | Admitting: Radiation Oncology

## 2014-05-31 DIAGNOSIS — C50911 Malignant neoplasm of unspecified site of right female breast: Secondary | ICD-10-CM | POA: Diagnosis not present

## 2014-06-01 DIAGNOSIS — C50911 Malignant neoplasm of unspecified site of right female breast: Secondary | ICD-10-CM | POA: Diagnosis not present

## 2014-06-08 ENCOUNTER — Ambulatory Visit (INDEPENDENT_AMBULATORY_CARE_PROVIDER_SITE_OTHER): Payer: Medicare Other | Admitting: Internal Medicine

## 2014-06-08 ENCOUNTER — Encounter: Payer: Self-pay | Admitting: Internal Medicine

## 2014-06-08 VITALS — BP 128/70 | HR 90 | Temp 98.4°F | Resp 14 | Ht 62.5 in | Wt 135.5 lb

## 2014-06-08 DIAGNOSIS — F5105 Insomnia due to other mental disorder: Secondary | ICD-10-CM

## 2014-06-08 DIAGNOSIS — F411 Generalized anxiety disorder: Secondary | ICD-10-CM | POA: Insufficient documentation

## 2014-06-08 DIAGNOSIS — I10 Essential (primary) hypertension: Secondary | ICD-10-CM

## 2014-06-08 DIAGNOSIS — F409 Phobic anxiety disorder, unspecified: Secondary | ICD-10-CM | POA: Diagnosis not present

## 2014-06-08 MED ORDER — AMLODIPINE BESYLATE 2.5 MG PO TABS: 2.5000 mg | ORAL_TABLET | Freq: Every day | ORAL | Status: DC

## 2014-06-08 NOTE — Progress Notes (Signed)
Pre-visit discussion using our clinic review tool. No additional management support is needed unless otherwise documented below in the visit note.  

## 2014-06-08 NOTE — Patient Instructions (Signed)
Your blood pressures are fine,  Continue taking the amlodipine just once  daily 2.5 mg    Your anxiety and insomnia are making your tired and jittery  Get back to [place]ong for a daily walk, and lift your fears to God.  You can use the lorazepam as needed for restless sleep as well

## 2014-06-08 NOTE — Progress Notes (Signed)
Patient ID: Kim Sanchez, female   DOB: Sep 15, 1940, 74 y.o.   MRN: [id number]  Patient Active Problem List   Diagnosis Date Noted  . Generalized anxiety disorder 06/08/2014  . Hypertensive urgency 03/21/2014  . 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 Oral thrush 01/18/2014  . Impaired fasting blood sugar 01/18/2014  . Hyperglycemia 10/03/2013  . Unspecified constipation 06/03/2013  . Medication management 05/12/2013  . Hair loss 05/12/2013  . Hypertension 05/05/2013  . Routine general medical examination at a health care facility 01/16/2013  . Screening for cervical cancer 01/18/2012  . Screening for colon cancer 01/18/2012  . Arthritis   . GERD (gastroesophageal reflux disease)   . History of blood transfusion   . Hyperlipidemia 11/27/2011  . Crohn's disease     Subjective:  CC:   Chief Complaint  Patient presents with  . Follow-up    Blood pressure    HPI:   Kim Sanchez is a 74 y.o. female who presents for  Follow up on hypertension..  Home readings have been elevated, but she has brought her home machine in for calibration today.  Her ome bp machine is inaccurate and givein greadings that are elvated by early 20 pts.   Today's reading in office is fine.    C; no energy,  Breathless a lot.  Bad taste in mouth,  And feels jittery inside all the time.  Husband Reggie had an embolic CVA last weekedn,  Home now..  Really shook her up.  Has not been sleepign well or exercising,   Worried that the hctz could cause vision changes,  bc she had an episode of "lines"  In her visual field when she had a panic attack.Marland Kitchen Resolved, with lorazpeam.  Has only taken the lorazepam 3 or 4 times since prescribed in early December.   Stopped taking the second dose of amlodiine 2.5 twice daily ,  Now taking once daily,  Aching in legs has resolved.   Follow up on hypertension , uncontrolled complicated by multiple drug intolerances . She was seen on nov 18th with hypertension urgency  by MD at [facility].    Clonidine  0.566m was given  For bp in office 200/100 --> 160/82 after clonidine. Her  history of Intolerance to several meds in the past was reviewed .  pt felt that losartan was ineffective  and agreed to retrial  Of prior meds.  The losartan 266mwas stoppeed and Dr G Darnell Leveltarted amlodipine 2.66m77mnd hctz 12.66mg91mily.   [facility] amlodipine to 5 mg daily and continued the hctz at 12.5 mg daily    Has had multipel side effects to report including excessive  Belching   Feeling jittery inside,  And legs aching.  Legs stopped aching  whe she stopped RYR.    Today her bp is elevated and her home bps have been less than 160 546tolic     Past Medical History  Diagnosis Date  . Crohn's disease   . Arthritis   . GERD (gastroesophageal reflux disease)   . History of blood transfusion 1960  . Hypertension   . 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    Past Surgical History  Procedure Laterality Date  . Dilation and curettage of uterus    .  Brain surgery  1980    breast biopsies, benign  . Breast surgery  1980's    fibro cyst both breast in [place]  . Breast lumpectomy Right May 2014    T1c, N0; ER/PR positive, HER-2/neu not overexpressing.       The following portions of the patient's history were reviewed and updated as appropriate: Allergies, current medications, and problem list.    Review of Systems:   Patient denies headache, fevers, malaise, unintentional weight loss, skin rash, eye pain, sinus congestion and sinus pain, sore throat, dysphagia,  hemoptysis , cough, dyspnea, wheezing, chest pain, palpitations, orthopnea, edema, abdominal pain, nausea, melena, diarrhea, constipation, flank pain, dysuria, hematuria, urinary  Frequency,  nocturia, numbness, tingling, seizures,  Focal weakness, Loss of consciousness,  Tremor, insomnia, depression, anxiety, and suicidal ideation.     History   Social History  . Marital Status: Married    Spouse Name: N/A    Number of Children: N/A  . Years of Education: N/A   Occupational History  . Not on file.   Social History Main Topics  . Smoking status: Never Smoker   . Smokeless tobacco: Never Used  . Alcohol Use: No  . Drug Use: No  . Sexual Activity: Not Currently   Other Topics Concern  . Not on file   Social History Narrative    Objective:  Filed Vitals:   06/08/14 1029  BP: 128/70  Pulse: 90  Temp: 98.4 F (36.9 C)  Resp: 14     General appearance: anxiious, tearful, alert, cooperative and appears stated age Ears: normal TM's and external ear canals both ears Throat: lips, mucosa, and tongue normal; teeth and gums normal Neck: no adenopathy, no carotid bruit, supple, symmetrical, trachea midline and thyroid not enlarged, symmetric, no tenderness/mass/nodules Back: symmetric, no curvature. ROM normal. No CVA tenderness. Lungs: clear to auscultation bilaterally Heart: regular rate and rhythm, S1, S2 normal, no murmur, click, rub or gallop Abdomen: soft, non-tender; bowel sounds normal; no masses,  no organomegaly Pulses: 2+ and symmetric Skin: Skin color, texture, turgor normal. No rashes or lesions Lymph nodes: Cervical, supraclavicular, and axillary nodes normal.  Assessment and Plan:  Hypertension Elevated by anxiety,. Repeat is near goal .  Given her multiple drug intolerances, advsied her to contiue 2.5 mg amlodipine .  Will continue current medications and add valium prn anxiety.    Insomnia due to anxiety and fear Managed wiith prn alprazolam.    Generalized anxiety disorder aggravated by husband reggie's recent embolic stroke. Discussed roel of pharamcotherapy.  The risks and benefits of benzodiazepine use were discussed with patient today  including excessive sedation leading to respiratory depression,  impaired thinking/driving, and addiction.  Patient was advised to avoid concurrent use with alcohol, to use medication only as needed and not to share with others  .       A total of 25 minutes of face to face time was spent with patient more than half of which was spent in counselling on the above mentioned issues.   Updated Medication List Outpatient Encounter Prescriptions as of 06/08/2014  Medication Sig  . amLODipine (NORVASC) 2.5 MG tablet Take 1 tablet (2.5 mg total) by mouth daily.  Marland Kitchen anastrozole (ARIMIDEX) 1 MG tablet Take 1 tablet by mouth daily.  . Biotin 10 MG CAPS Take by mouth.  . Calcium Carb-Cholecalciferol (CALCIUM + D3) 600-200 MG-UNIT TABS Take by mouth daily.   . Cholecalciferol (VITAMIN D3) 2000 UNITS TABS Take 1 tablet by mouth daily.  Marland Kitchen  Fiber CHEW Chew by mouth as needed.  . fish oil-omega-3 fatty acids 1000 MG capsule Take 2 g by mouth daily.  . hydrochlorothiazide (MICROZIDE) 12.5 MG capsule TAKE 1 CAPSULE BY MOUTH EVERY DAY  . ibuprofen (ADVIL,MOTRIN) 200 MG tablet Take 200 mg by mouth every 6 (six) hours as needed.  . inFLIXimab (REMICADE) 100 MG injection Inject 1 mg into the vein every 8 (eight) weeks.  Marland Kitchen LORazepam (ATIVAN) 0.5 MG tablet TAKE 1 TABLET BY MOUTH EVERY DAY AS NEEDED  . Multiple Vitamin (MULTIVITAMIN) tablet Take 1 tablet by mouth daily.  Marland Kitchen nystatin (MYCOSTATIN) 100000 UNIT/ML suspension Take 5 mLs (500,000 Units total) by mouth 4 (four) times daily.  . polyethylene glycol (MIRALAX / GLYCOLAX) packet Take 17 g by mouth daily as needed.   . Red Yeast Rice 600 MG CAPS Take by mouth daily.   . [DISCONTINUED] amLODipine (NORVASC) 2.5 MG tablet Take 1 tablet (2.5 mg total) by mouth 2 (two) times daily. (Patient taking differently: Take 2.5 mg by mouth daily. )  . [DISCONTINUED] ALPRAZolam (XANAX) 0.25 MG tablet Take 1 tablet (0.25 mg total) by mouth at bedtime as needed for anxiety or sleep.  (Patient not taking: Reported on 06/08/2014)     No orders of the defined types were placed in this encounter.    Return in about 3 months (around 09/06/2014).

## 2014-06-10 ENCOUNTER — Encounter: Payer: Self-pay | Admitting: Internal Medicine

## 2014-06-10 NOTE — Assessment & Plan Note (Signed)
aggravated by husband reggie's recent embolic stroke. Discussed roel of pharamcotherapy.  The risks and benefits of benzodiazepine use were discussed with patient today including excessive sedation leading to respiratory depression,  impaired thinking/driving, and addiction.  Patient was advised to avoid concurrent use with alcohol, to use medication only as needed and not to share with others  .

## 2014-06-10 NOTE — Assessment & Plan Note (Signed)
Elevated by anxiety,. Repeat is near goal .  Given her multiple drug intolerances, advsied her to contiue 2.5 mg amlodipine .  Will continue current medications and add valium prn anxiety.

## 2014-06-10 NOTE — Assessment & Plan Note (Signed)
Managed wiith prn alprazolam.

## 2014-06-15 ENCOUNTER — Other Ambulatory Visit: Payer: Self-pay | Admitting: Internal Medicine

## 2014-06-25 ENCOUNTER — Telehealth: Payer: Self-pay | Admitting: Internal Medicine

## 2014-06-25 NOTE — Telephone Encounter (Signed)
Patient been taking HCTZ for increased BP and has developed a cough patient read in magazine article that BP medications can cause cough and stopped taking the HCTZ due to cough, patient stated BP has been Ok on the amlodipine since she stopped the HCTZ, also patient is concerned because she is allergic to Sulfur medications and HCTZ being a derivative of sulfur that she is allergic to the medication.  Advised patient to continue to monitor BP until I could return call.

## 2014-06-25 NOTE — Telephone Encounter (Signed)
See prior unrouted response to medication question

## 2014-06-25 NOTE — Telephone Encounter (Signed)
If her BP is controlled on amlodipine she can stop the htcz, but I do not think she has an allergy to it. There is not enough sulfur in it to cause an allergic reaction i people with sulfa allergies

## 2014-06-26 NOTE — Telephone Encounter (Signed)
Patient notified and stateds he will stop the HCTZ unless BP increases.

## 2014-07-02 DIAGNOSIS — H2513 Age-related nuclear cataract, bilateral: Secondary | ICD-10-CM | POA: Diagnosis not present

## 2014-07-20 DIAGNOSIS — J069 Acute upper respiratory infection, unspecified: Secondary | ICD-10-CM | POA: Diagnosis not present

## 2014-07-23 ENCOUNTER — Ambulatory Visit: Payer: Medicare Other | Admitting: Internal Medicine

## 2014-07-24 ENCOUNTER — Ambulatory Visit (INDEPENDENT_AMBULATORY_CARE_PROVIDER_SITE_OTHER): Payer: Medicare Other | Admitting: Internal Medicine

## 2014-07-24 ENCOUNTER — Encounter: Payer: Self-pay | Admitting: Internal Medicine

## 2014-07-24 VITALS — BP 148/76 | HR 78 | Temp 98.2°F | Ht 62.5 in | Wt 136.5 lb

## 2014-07-24 DIAGNOSIS — I10 Essential (primary) hypertension: Secondary | ICD-10-CM

## 2014-07-24 DIAGNOSIS — Z23 Encounter for immunization: Secondary | ICD-10-CM | POA: Diagnosis not present

## 2014-07-24 DIAGNOSIS — M25561 Pain in right knee: Secondary | ICD-10-CM

## 2014-07-24 NOTE — Assessment & Plan Note (Signed)
BP Readings from Last 3 Encounters:  07/24/14 148/76  06/08/14 128/70  04/11/14 146/84   BP well controlled. Will continue current medications. Discussed changing Amlodipine to 12m dosing at bedtime, rather than split dosing if having lightheadedness in mornings. Follow up prn and in 09/2014 with Dr. TDerrel Nip

## 2014-07-24 NOTE — Assessment & Plan Note (Signed)
Symptoms likely related to OA and muscular strain after gardening. Discussed that IT band syndrome may have contributed. Discussed getting plain xray of right knee, however will hold off as symptoms improved, exam normal. Plan to continue Tylenol prn. Follow up if recurrent symptoms or any new concerns.

## 2014-07-24 NOTE — Patient Instructions (Addendum)
Continue to use Tylenol 1038m up to three times daily for knee pain. Please call if symptoms are persistent and we will get a plain xray of your knee.  Consider taking your doses of Amlodipine, total 589m at bedtime.  Follow up in 09/2014 and as needed.

## 2014-07-24 NOTE — Addendum Note (Signed)
Addended by: Vernetta Honey on: 07/24/2014 01:39 PM   Modules accepted: Orders

## 2014-07-24 NOTE — Progress Notes (Signed)
Subjective:    Patient ID: Kim Sanchez, female    DOB: 06/19/1940, 74 y.o.   MRN: [id number]  HPI 74YO female presents for acute visit.  Leg pain - Right knee pain radiating up to thigh with tingling started about 2 weeks ago. She had been doing gardening prior to this. Mild pain in left anterior lower leg. No persistent leg swelling noted. No weakness. Took tylenol with some improvement.  She was concerned that BP med might cause this.  HTN - Concerned about elevated BP. BP at home running 120-130s/60-70. No CP, palpitations. Feels lightheaded after taking Amlodipine in the morning.  Recently completed z-pack for URI, through [facility] urgent care. No recent fever, chills.   Past medical, surgical, family and social history per today's encounter.  Review of Systems  Constitutional: Negative for fever, chills, appetite change, fatigue and unexpected weight change.  HENT: Negative for congestion, postnasal drip, rhinorrhea, sinus pressure, sneezing, sore throat and trouble swallowing.   Eyes: Negative for visual disturbance.  Respiratory: Negative for cough and shortness of breath.   Cardiovascular: Negative for chest pain and leg swelling.  Gastrointestinal: Negative for nausea, vomiting, abdominal pain, diarrhea and constipation.  Musculoskeletal: Positive for arthralgias.  Skin: Negative for color change and rash.  Neurological: Positive for light-headedness. Negative for dizziness, speech difficulty, weakness, numbness and headaches.  Hematological: Negative for adenopathy. Does not bruise/bleed easily.  Psychiatric/Behavioral: Negative for dysphoric mood. The patient is not nervous/anxious.        Objective:    BP 148/76 mmHg  Pulse 78  Temp(Src) 98.2 F (36.8 C) (Oral)  Ht 5' 2.5" (1.588 m)  Wt 136 lb 8 oz (61.916 kg)  BMI 24.55 kg/m2  SpO2 98% Physical Exam  Constitutional: She is oriented to person, place, and time. She appears well-developed and well-nourished.  No distress.  HENT:  Head: Normocephalic and atraumatic.  Right Ear: External ear normal.  Left Ear: External ear normal.  Nose: Nose normal.  Mouth/Throat: Oropharynx is clear and moist. No oropharyngeal exudate.  Eyes: Conjunctivae are normal. Pupils are equal, round, and reactive to light. Right eye exhibits no discharge. Left eye exhibits no discharge. No scleral icterus.  Neck: Normal range of motion. Neck supple. No tracheal deviation present. No thyromegaly present.  Cardiovascular: Normal rate, regular rhythm, normal heart sounds and intact distal pulses.  Exam reveals no gallop and no friction rub.   No murmur heard. Pulmonary/Chest: Effort normal and breath sounds normal. No respiratory distress. She has no wheezes. She has no rales. She exhibits no tenderness.  Musculoskeletal: Normal range of motion. She exhibits no edema or tenderness.       Right knee: She exhibits normal range of motion, no swelling, no deformity, no erythema, normal patellar mobility and no MCL laxity. No tenderness found.  Lymphadenopathy:    She has no cervical adenopathy.  Neurological: She is alert and oriented to person, place, and time. No cranial nerve deficit. She exhibits normal muscle tone. Coordination normal.  Skin: Skin is warm and dry. No rash noted. She is not diaphoretic. No erythema. No pallor.  Psychiatric: She has a normal mood and affect. Her behavior is normal. Judgment and thought content normal.          Assessment & Plan:   Problem List Items Addressed This Visit      Unprioritized   Hypertension    BP Readings from Last 3 Encounters:  07/24/14 148/76  06/08/14 128/70  04/11/14 146/84   BP  well controlled. Will continue current medications. Discussed changing Amlodipine to 42m dosing at bedtime, rather than split dosing if having lightheadedness in mornings. Follow up prn and in 09/2014 with Dr. TDerrel Nip      Right knee pain - Primary    Symptoms likely related to OA and  muscular strain after gardening. Discussed that IT band syndrome may have contributed. Discussed getting plain xray of right knee, however will hold off as symptoms improved, exam normal. Plan to continue Tylenol prn. Follow up if recurrent symptoms or any new concerns.          Return if symptoms worsen or fail to improve.

## 2014-07-24 NOTE — Progress Notes (Signed)
Pre visit review using our clinic review tool, if applicable. No additional management support is needed unless otherwise documented below in the visit note. 

## 2014-07-25 ENCOUNTER — Ambulatory Visit
Admit: 2014-07-25 | Disposition: A | Discharge status: Home / Self Care | Payer: Self-pay | Attending: Oncology | Admitting: Oncology

## 2014-07-25 DIAGNOSIS — I10 Essential (primary) hypertension: Secondary | ICD-10-CM | POA: Diagnosis not present

## 2014-07-25 DIAGNOSIS — Z79899 Other long term (current) drug therapy: Secondary | ICD-10-CM | POA: Diagnosis not present

## 2014-07-25 DIAGNOSIS — Z170 Estrogen receptor positive status [ER+]: Secondary | ICD-10-CM | POA: Diagnosis not present

## 2014-07-25 DIAGNOSIS — Z79811 Long term (current) use of aromatase inhibitors: Secondary | ICD-10-CM | POA: Diagnosis not present

## 2014-07-25 DIAGNOSIS — C50911 Malignant neoplasm of unspecified site of right female breast: Secondary | ICD-10-CM | POA: Diagnosis not present

## 2014-07-25 DIAGNOSIS — K219 Gastro-esophageal reflux disease without esophagitis: Secondary | ICD-10-CM | POA: Diagnosis not present

## 2014-07-25 DIAGNOSIS — M129 Arthropathy, unspecified: Secondary | ICD-10-CM | POA: Diagnosis not present

## 2014-08-03 ENCOUNTER — Ambulatory Visit
Admit: 2014-08-03 | Disposition: A | Discharge status: Home / Self Care | Payer: Self-pay | Attending: Oncology | Admitting: Oncology

## 2014-08-03 DIAGNOSIS — K5090 Crohn's disease, unspecified, without complications: Secondary | ICD-10-CM | POA: Diagnosis not present

## 2014-08-15 IMAGING — MG MM MAMMO DIAGNOSTIC UNILATERAL*R*
1 series · 5 of 5 positions shown · non-contrast
Comparison: none

REASON FOR EXAM: RT BRST LUMP 12 OCLOCK
COMMENTS:

PROCEDURE:     MAM - MAM DGTL UNI MAM RT BREAST W/CAD  - August 10, 2012  [DATE]
RESULT:
Comparisons: 01/14/2012, 12/31/2010, 09/18/2009, 08/01/2007, and 06/10/2005.

[R CC · right · 5 of 5 slices shown]
[im 1/5]
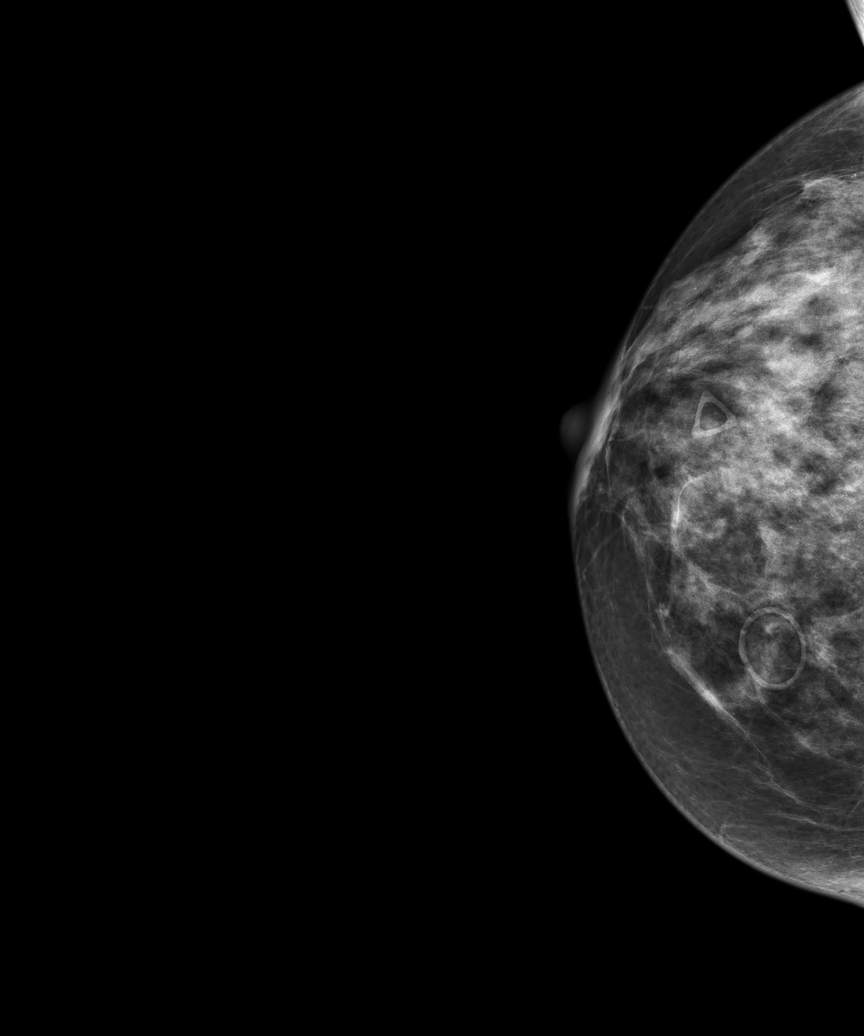
[im 2/5]
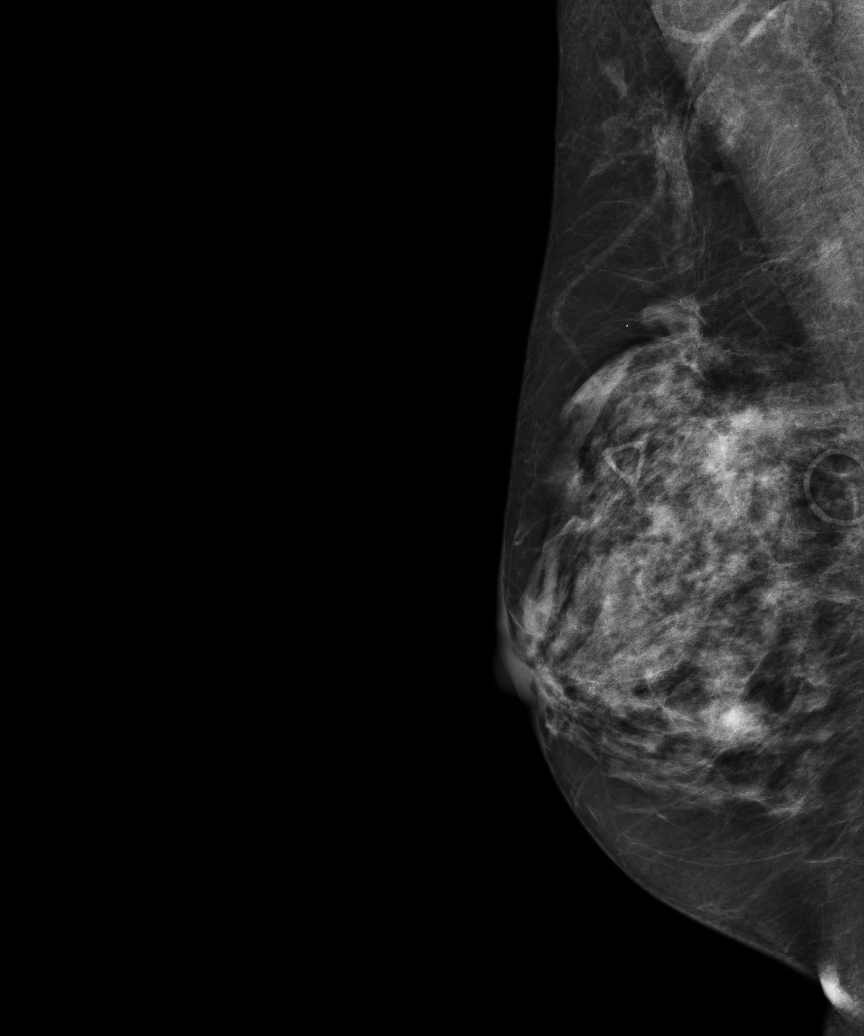
[im 3/5]
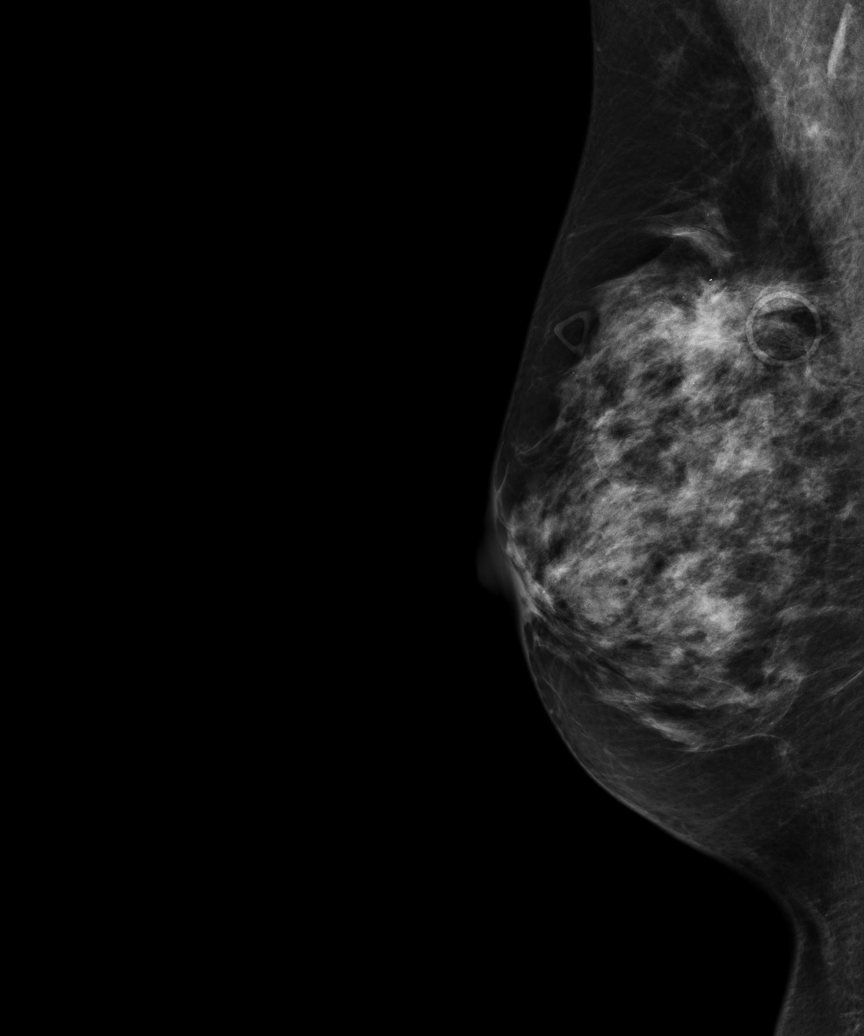
[im 4/5]
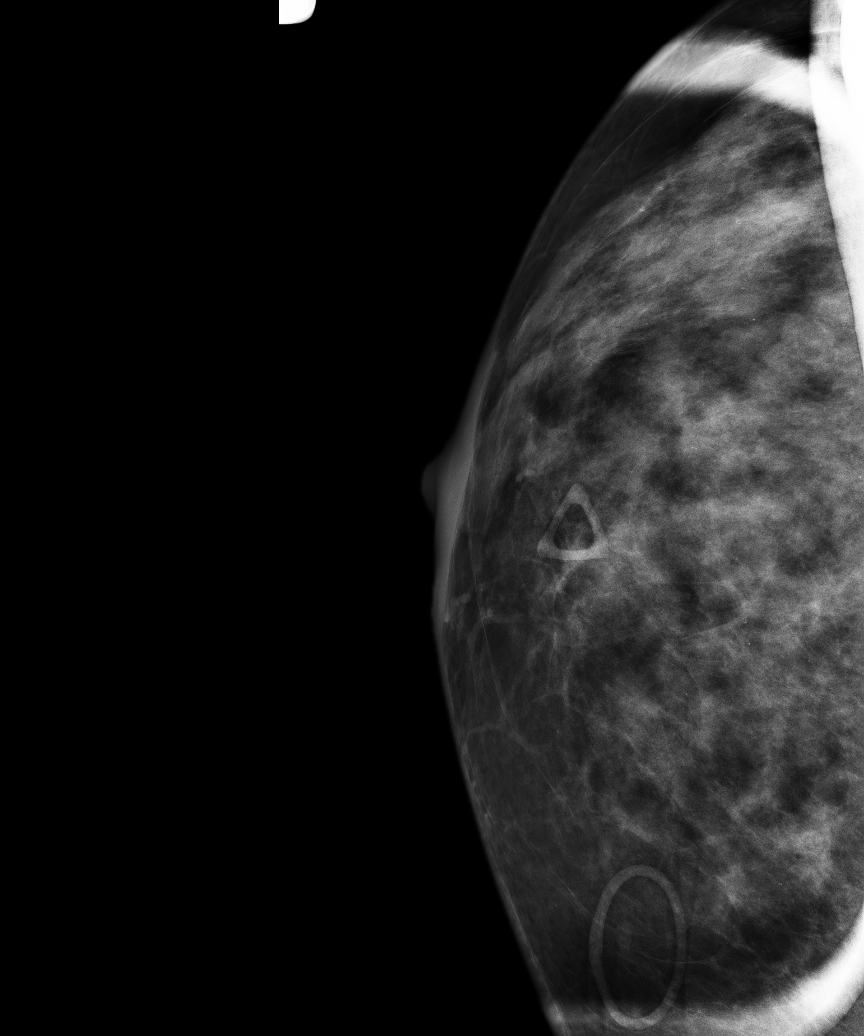
[im 5/5]
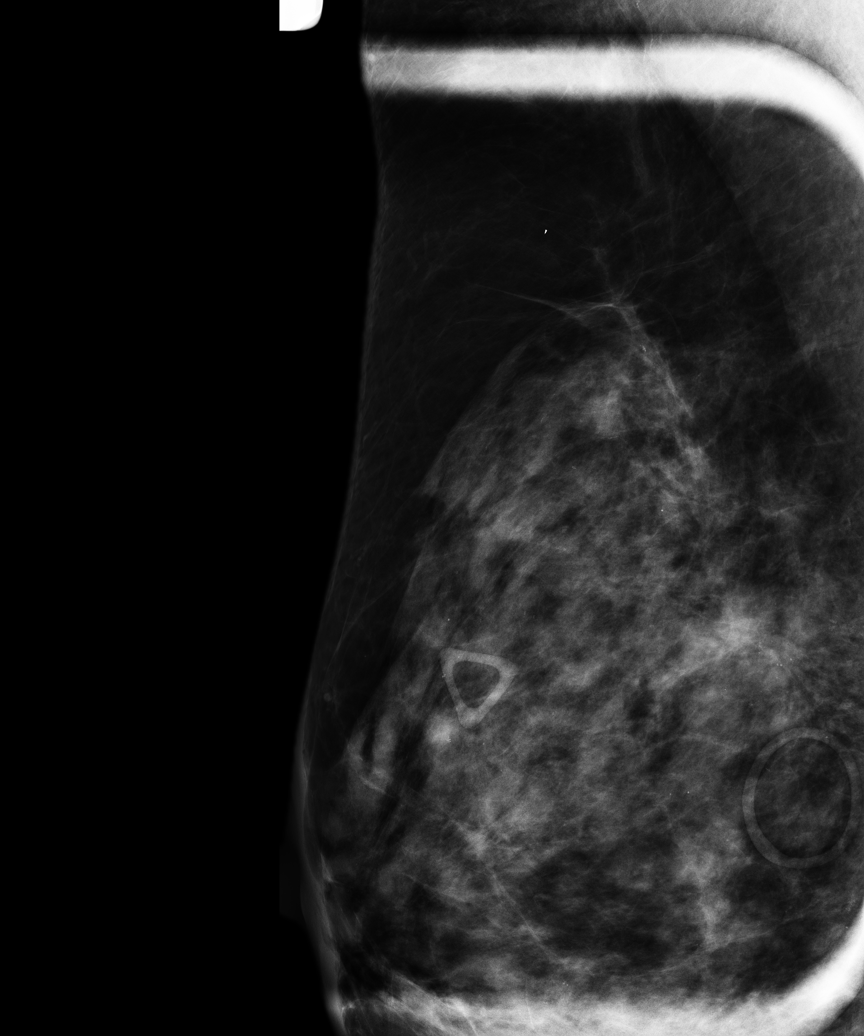

[5 of 5 positions shown; findings below may reference images not displayed]

FINDINGS: The breast tissue is heterogeneously dense, which may lower the sensitivity
of mammography. A marker was placed along the superior right breast at
approximately 12 o'clock secondary to reported palpable abnormality at this
site. Spot compression magnification views were performed of this region.
There is suggestion of subtle architectural distortion in this region,
though no distinct mass is seen by mammography. There are diffusely
scattered round calcifications in the superior right breast, which are seen
on the spot compression magnification views, which are similar to the prior
studies. Small asymmetry in the inferior aspect of the right MLO view is
similar to prior studies including 08/01/2007.

Real-time ultrasound was performed of the superior right breast at the site
of reported palpable abnormality. At this site, there is a mass with
ill-defined borders. It is difficult to be certain of the size given the
ill-defined borders and the appearance of focal area of hypoechogenicity as
well as surrounding hyperechogenicity. The more focal area of
hypoechogenicity measures 1.6 x 1.2 x 0.8 cm. It is uncertain if the
adjacent hyperechogenicity is related to this mass. There is some posterior
acoustic shadowing.
IMPRESSION: BI-RADS: Category 4 - Suspicious Abnormality.

BREAST COMPOSITION: The breast composition is HETEROGENEOUSLY DENSE
(glandular tissue is 51-75%) This may decrease the sensitivity of
mammography.

Surgical consultation and tissue diagnosis are recommended for the
suspicious mass in the superior right breast.

A NEGATIVE MAMMOGRAM REPORT DOES NOT PRECLUDE BIOPSY OR OTHER EVALUATION OF
A CLINICALLY PALPABLE OR OTHERWISE SUSPICIOUS MASS OR LESION. BREAST CANCER
MAY NOT BE DETECTED BY MAMMOGRAPHY IN UP TO 10% OF CASES.

## 2014-08-16 ENCOUNTER — Other Ambulatory Visit: Payer: Self-pay

## 2014-08-16 DIAGNOSIS — C50911 Malignant neoplasm of unspecified site of right female breast: Secondary | ICD-10-CM

## 2014-08-24 NOTE — Op Note (Signed)
PATIENT NAME:  Kim Sanchez, Kim Sanchez MR#:  [id number] DATE OF BIRTH:  [birth year]  DATE OF PROCEDURE:  09/14/2012  PREOPERATIVE DIAGNOSIS:  Right breast cancer.   POSTOPERATIVE DIAGNOSIS:  Right breast cancer.   OPERATIVE PROCEDURE:  Right breast wide excision, mastoplasty, sentinel node biopsy.   SURGEON:  Hervey Ard.   ANESTHESIA:  General by LMA under Dr. Marcello Moores, Marcaine 0.5% with 1:200,000 units of epinephrine, 30 mL local infiltration.   ESTIMATED BLOOD LOSS:  Less than 10 mL.   CLINICAL NOTE:  This 74 year old woman recently identified a breast mass on self-exam. Core biopsy showed evidence of invasive mammary carcinoma. She desired breast conservation.   She was injected with technetium approximately 3 hours prior to the procedure. She underwent general endotracheal anesthesia without difficulty. The breast, chest and axilla was prepped with ChloraPrep and draped after instillation of 4 mL of methylene blue diluted 1:2 with normal saline. Attention was turned to the axilla. The Gamma Finder showed an area of increased uptake and a small transverse incision was made after instillation of local anesthetic. A single hot blue fatty replaced node was identified and sent for frozen section. Touch prep showed no evidence of macrometastatic disease.   While the lymph node pathology was pending, attention was turned to the breast. Ultrasound was used to confirm the boundaries of the resection. Local anesthesia was infiltrated, and a skin line incision made. The skin was incised sharply, and the remaining dissection completed with electrocautery. A 4 x 4 x 4 cm block of tissue including the pectoralis fascia was excised. The subcutaneous fat was not disturbed. Specimen was orientated, specimen radiograph obtained showing the previously placed biopsy clip within the center of the lesion, and it was sent for pathologic review. Margins were grossly negative. A mastoplasty was planned to allow for good breast  contour. The breast was elevated off the underlying pectoralis muscle circumferentially for a distance of approximately 6 cm. The deep tissue was then approximated with interrupted 2-0 Vicryl figure-of-eight sutures. A similar procedure was completed for the adipose layer. The skin was then closed with a running 4-0 Vicryl subcuticular suture.   In the axilla, the wound was closed with interrupted 2-0 Vicryl sutures through the deep layer and a running 4-0 Vicryl subcuticular suture for the skin. Dermabond was applied and fluff gauze, Kerlix and an Ace wrap placed.   The patient tolerated the procedure well and was taken to the recovery room in stable condition.     ____________________________ [name], MD jwb:dmm D: 09/14/2012 20:18:16 ET T: 09/14/2012 20:41:58 ET JOB#: [id number]  cc: [name], MD, <Dictator> Deborra Medina, MD Cyrstal Leitz Amedeo Kinsman MD ELECTRONICALLY SIGNED 09/15/2012 14:22

## 2014-08-24 NOTE — Consult Note (Signed)
Reason for Visit: This 74 year old Female patient presents to the clinic for initial evaluation of  breast cancer .   Referred by Dr. Hervey Ard.  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  Pathology Report pathology report reviewed   Imaging Report mammogram and ultrasound reviewed   Referral Report clinical notes reviewed   Planned Treatment Regimen possible accelerated partial breast radiation   HPI   patient is a 74 year old female who presentedwith a self discovered mass in her right breast. Underwent mammogram and ultrasound both confirming massin the superior portion of the right breast at the 12:00 position showing subtle architectural distortion  and hypoechoic mass on ultrasound measuring approximate 1.6 cm. She underwent stereotactic guided biopsy which was positive for invasive mammary carcinoma. She then underwent a wide local excision and sentinel node biopsy. Tumor was  1.4 cm margins clear. No lymph vascular invasion was seen. Sentinel lymph node was negative. tumor was ER/PR positive strongly.  Tolerated her surgery well. She is now seen for consideration of postop radiation. She still bothered little bit by the sentinel lymph node incision site  although it is healing well.  Past, Family and Social History:  Past Medical History positive   Gastrointestinal GERD; Crohn's disease   Past Surgical History D&C, benign breast biopsies in in the 1980s   Past Medical History Comments arthritis   Family History noncontributory   Social History noncontributory   Additional Past Medical and Surgical History accompanied by his sister and husband today   Allergies:   Sulfa drugs: Unknown  Home Meds:  Home Medications: Medication Instructions Status  LORazepam 0.5 mg oral tablet 1 tab(s) orally  once a day Active  acetaminophen-HYDROcodone 325 mg-5 mg oral tablet 1 tab(s) orally every 4 to 6 hours, As needed, pain Active  biotin 10 mg oral tablet 1 tab(s) orally once a day (in the morning) Active  Calcium 600+D 600 mg-200 units oral tablet 2 tab(s) orally once a day (in the morning) Active  Fiber Therapy 3 cap(s) orally once a day (at bedtime) Active  Fish Oil 1000 mg oral capsule 1 cap(s) orally once a day (in the morning) Active  ibuprofen 200 mg oral tablet 1 tab(s) orally once a day (in the morning) Active  multivitamin 1 tab(s) orally once a day (in the morning) Active  MiraLax - oral powder for reconstitution 1 dose(s) orally once a day, As Needed Active  Red Yeast Rice 600 mg oral capsule 1 cap(s) orally once a day Active  Remicade 100 mg intravenous powder for injection  intravenous every 8 weeks Active   Review of Systems:  General negative   Performance Status (ECOG) 0   Skin negative   Breast see HPI   Ophthalmologic negative   ENMT negative   Respiratory and Thorax negative   Cardiovascular negative   Gastrointestinal negative   Genitourinary negative   Musculoskeletal negative   Neurological negative   Psychiatric negative   Hematology/Lymphatics negative   Endocrine negative   Allergic/Immunologic negative   Review of Systems   according to nurse's notesPatient denies any weight loss, fatigue, weakness, fever, chills or night sweats. Patient denies any loss of vision, blurred vision. Patient denies any ringing  of the ears or hearing loss. No irregular heartbeat. Patient denies heart murmur or history of fainting. Patient denies any chest  pain or pain radiating to her upper extremities. Patient denies any shortness of breath, difficulty breathing at night, cough or hemoptysis. Patient denies any swelling in the lower legs. Patient denies any nausea vomiting, vomiting of blood, or coffee ground material in the vomitus. Patient denies any stomach  pain. Patient states has had normal bowel movements no significant constipation or diarrhea. Patient denies any dysuria, hematuria or significant nocturia. Patient denies any problems walking, swelling in the joints or loss of balance. Patient denies any skin changes, loss of hair or loss of weight. Patient denies any excessive worrying or anxiety or significant depression. Patient denies any problems with insomnia. Patient denies excessive thirst, polyuria, polydipsia. Patient denies any swollen glands, patient denies easy bruising or easy bleeding. Patient denies any recent infections, allergies or URI. Patient "s visual fields have not changed significantly in recent time.  Physical Exam:  General/Skin/HEENT:  General normal   Skin normal   Eyes normal   ENMT normal   Head and Neck normal   Additional PE well-developed well-nourished female in NAD. Right breast has a wide local excision scar healing well. Also sentinel lymph node excision scar healing well with some slight crusting around it. No other dominant mass or nodularity is noted in either breast into position examined. Lungs are clear to A&P cardiac examination shows regular rate and rhythm. Abdomen is benign.   Breasts/Resp/CV/GI/GU:  Respiratory and Thorax normal   Cardiovascular normal   Gastrointestinal normal   Genitourinary normal   MS/Neuro/Psych/Lymph:  Musculoskeletal normal   Neurological normal   Lymphatics normal   Other Results:  Radiology Results: [place]:    09-Apr-14 15:16, US Breast Right  US Breast Right   REASON FOR EXAM:    RT BRST LUMP 12 OCLOCK  COMMENTS:       PROCEDURE: [place]  - US BREAST RIGHT  - Aug 10 2012  3:16PM     RESULT:     Comparisons: 01/14/2012, 12/31/2010, 09/18/2009, 08/01/2007, and 06/10/2005.    Findings:  The breast tissue is heterogeneously dense, which may lower the   sensitivity of mammography. A marker was placed along the superior right   breast at approximately 12  o'clock secondary to reported palpable   abnormality at this site. Spot compression magnification views were   performed of this region. There is suggestion of subtle architectural     distortion in this region, though no distinct mass is seen by   mammography. There are diffusely scattered round calcifications in the   superior right breast, which are seen on the spot compression   magnification views, which are similar to the prior studies. Small   asymmetry in the inferior aspect of the right MLO view is similar to   prior studies including 08/01/2007.    Real-time ultrasound was performed of the superior right breast at the   site of reported palpable abnormality. At this site, there is a mass with   ill-defined borders. It is difficult to be certain of the size given the   ill-defined borders and the appearance of focal area of hypoechogenicity   as well as surrounding hyperechogenicity. The more focal area of   hypoechogenicity measures 1.6 x 1.2 x 0.8 cm. It is uncertain if the   adjacent hyperechogenicity is related to this mass. There is some   posterior acoustic shadowing.  IMPRESSION:      BI-RADS: Category 4 - Suspicious Abnormality.    BREAST COMPOSITION: The breast composition is HETEROGENEOUSLY DENSE   (  glandular tissue is 51-75%) This may decrease the sensitivity of   mammography.      Surgical consultation and tissue diagnosis are recommended for the   suspicious mass in the superior right breast.      Thank you for the opportunity to contribute to the care of your patient.       Verified By: Gregor Hams, M.D., MD  LabUnknown:    09-Apr-14 14:50, Digital Unilateral Right Breast  PACS Image     09-Apr-14 15:16, US Breast Right  PACS [place]:    09-Apr-14 14:50, Digital Unilateral Right Breast  Digital Unilateral Right Breast   REASON FOR EXAM:    RT BRST LUMP 12 OCLOCK  COMMENTS:       PROCEDURE: MAM - MAM DGTL UNI MAM RT BREAST W/CAD  -  Aug 10 2012  2:50PM     RESULT:     Comparisons: 01/14/2012, 12/31/2010, 09/18/2009, 08/01/2007, and 06/10/2005.    Findings:  The breast tissue is heterogeneously dense, which may lower the   sensitivity of mammography. A marker was placed along the superior right   breast at approximately 12 o'clock secondary to reported palpable   abnormality at this site. Spot compression magnification views were   performed of this region. There is suggestion of subtle architectural     distortion in this region, though no distinct mass is seen by   mammography. There are diffusely scattered round calcifications in the   superior right breast, whichare seen on the spot compression   magnification views, which are similar to the prior studies. Small   asymmetry in the inferior aspect of the right MLO view is similar to   prior studies including 08/01/2007.    Real-time ultrasound was performed ofthe superior right breast at the   site of reported palpable abnormality. At this site, there is a mass with   ill-defined borders. It is difficult to be certain of the size given the   ill-defined borders and the appearance of focal area of hypoechogenicity   as well as surrounding hyperechogenicity. The more focal area of   hypoechogenicity measures 1.6 x 1.2 x 0.8 cm. It is uncertain if the   adjacent hyperechogenicity is related to this mass. There is some   posterior acoustic shadowing.  IMPRESSION:      BI-RADS: Category 4 - Suspicious Abnormality.    BREAST COMPOSITION: The breast composition is HETEROGENEOUSLY DENSE   (glandular tissue is 51-75%) This may decrease the sensitivity of   mammography.      Surgical consultation and tissuediagnosis are recommended for the   suspicious mass in the superior right breast.    A NEGATIVE MAMMOGRAM REPORT DOES NOT PRECLUDE BIOPSY OR OTHER EVALUATION   OF A CLINICALLY PALPABLE OR OTHERWISE SUSPICIOUS MASS OR LESION. BREAST   CANCER MAY NOT BE DETECTED BY  MAMMOGRAPHY IN UP TO 10% OF CASES.  Thank you for the opportunity to contribute to the care of your patient.         Verified By: Gregor Hams, M.D., MD   Relevent Results:   Relevant Scans and Labs mammogram and ultrasound are reviewed   Assessment and Plan: Impression:   stage I invasive mammary carcinoma well-differentiated ER/PR positive status post wide local excision in 74 year old female Plan:   this time I got over postoperative radiation therapy treatment options with the patient including whole breast radiation versus accelerated partial breast irradiation. I have discussed the case  personally with Dr. or not feel she is a candidate for MammoSite balloon placement.  We are scheduling with his office for balloon placed in about 2 weeks. Should the balloon not be able to be placed based on shrinkage of her lumpectomy site or close proximity to this chest wall her skin will treat her with whole breast radiation therapy as I have outlined. Patient will also be a candidate for aromatase inhibitor after completion of radiation  to leave that decision to Dr. Tollie Pizza. Patient also may be a candidate for Oncotype DX and will discuss that with Dr. Tollie Pizza.  I would like to take this opportunity to thank you for allowing me to continue to participate in this patient's care.  CC Referral:  cc: Dr. Hervey Ard, Dr. Nicole Cella   Electronic Signatures: Baruch Gouty, Roda Shutters (MD)  (Signed 22-May-14 11:56)  Authored: HPI, Diagnosis, PFSH, Allergies, Home Meds, ROS, Physical Exam, Other Results, Relevent Results, Encounter Assessment and Plan, CC Referring Physician   Last Updated: 22-May-14 11:56 by Armstead Peaks (MD)

## 2014-08-28 ENCOUNTER — Other Ambulatory Visit: Payer: Self-pay | Admitting: Internal Medicine

## 2014-08-30 ENCOUNTER — Encounter: Payer: Self-pay | Admitting: Internal Medicine

## 2014-08-30 ENCOUNTER — Ambulatory Visit (INDEPENDENT_AMBULATORY_CARE_PROVIDER_SITE_OTHER): Payer: Medicare Other | Admitting: Internal Medicine

## 2014-08-30 ENCOUNTER — Telehealth: Payer: Self-pay | Admitting: Internal Medicine

## 2014-08-30 VITALS — BP 168/72 | HR 108 | Ht 62.5 in

## 2014-08-30 DIAGNOSIS — I10 Essential (primary) hypertension: Secondary | ICD-10-CM | POA: Diagnosis not present

## 2014-08-30 DIAGNOSIS — R0789 Other chest pain: Secondary | ICD-10-CM | POA: Insufficient documentation

## 2014-08-30 DIAGNOSIS — F411 Generalized anxiety disorder: Secondary | ICD-10-CM | POA: Diagnosis not present

## 2014-08-30 DIAGNOSIS — R0602 Shortness of breath: Secondary | ICD-10-CM | POA: Diagnosis not present

## 2014-08-30 LAB — CBC WITH DIFFERENTIAL/PLATELET
Basophils Absolute: 0 10*3/uL (ref 0.0–0.1)
Basophils Relative: 0.5 % (ref 0.0–3.0)
EOS PCT: 1.7 % (ref 0.0–5.0)
Eosinophils Absolute: 0.1 10*3/uL (ref 0.0–0.7)
HCT: 39.1 % (ref 36.0–46.0)
HEMOGLOBIN: 13.4 g/dL (ref 12.0–15.0)
Lymphocytes Relative: 27.3 % (ref 12.0–46.0)
Lymphs Abs: 1.9 10*3/uL (ref 0.7–4.0)
MCHC: 34.4 g/dL (ref 30.0–36.0)
MCV: 88.5 fl (ref 78.0–100.0)
MONOS PCT: 7.8 % (ref 3.0–12.0)
Monocytes Absolute: 0.6 10*3/uL (ref 0.1–1.0)
Neutro Abs: 4.5 10*3/uL (ref 1.4–7.7)
Neutrophils Relative %: 62.7 % (ref 43.0–77.0)
Platelets: 245 10*3/uL (ref 150.0–400.0)
RBC: 4.42 Mil/uL (ref 3.87–5.11)
RDW: 14.1 % (ref 11.5–15.5)
WBC: 7.1 10*3/uL (ref 4.0–10.5)

## 2014-08-30 LAB — COMPREHENSIVE METABOLIC PANEL
ALBUMIN: 4.5 g/dL (ref 3.5–5.2)
ALT: 16 U/L (ref 0–35)
AST: 18 U/L (ref 0–37)
Alkaline Phosphatase: 89 U/L (ref 39–117)
BUN: 10 mg/dL (ref 6–23)
CO2: 28 mEq/L (ref 19–32)
Calcium: 9.7 mg/dL (ref 8.4–10.5)
Chloride: 101 mEq/L (ref 96–112)
Creatinine, Ser: 0.67 mg/dL (ref 0.40–1.20)
GFR: 91.61 mL/min (ref >60.00)
GLUCOSE: 90 mg/dL (ref 70–99)
POTASSIUM: 3.7 meq/L (ref 3.5–5.1)
SODIUM: 138 meq/L (ref 135–145)
TOTAL PROTEIN: 7.6 g/dL (ref 6.0–8.3)
Total Bilirubin: 0.6 mg/dL (ref 0.2–1.2)

## 2014-08-30 LAB — LIPID PANEL
CHOLESTEROL: 255 mg/dL — AB (ref 0–200)
HDL: 79.2 mg/dL (ref >39.00)
LDL CALC: 157 mg/dL — AB (ref 0–99)
NonHDL: 175.8
Total CHOL/HDL Ratio: 3
Triglycerides: 96 mg/dL (ref 0.0–149.0)
VLDL: 19.2 mg/dL (ref 0.0–40.0)

## 2014-08-30 LAB — TROPONIN I: TNIDX: 0 ug/L (ref 0.00–0.06)

## 2014-08-30 LAB — TSH: TSH: 2.08 u[IU]/mL (ref 0.35–4.50)

## 2014-08-30 LAB — HEMOGLOBIN A1C: HEMOGLOBIN A1C: 6.1 % (ref 4.6–6.5)

## 2014-08-30 NOTE — Progress Notes (Signed)
Subjective:    Patient ID: Kim Sanchez, female    DOB: 1940/08/26, 74 y.o.   MRN: [id number]  HPI  74YO female presents as a walk in acute visit.  Feeling poorly since Monday. Had some vertigo and took benadryl. Feeling some chest heaviness 1-2 times per day. Lasts only a few seconds. No dyspnea or nausea. Feeling "jittery." Walked more than normal this past weekend. BP at home was elevated 154/83, 137/77. This afternoon BP was 353 systolic, however monitor may measure high.   Past medical, surgical, family and social history per today's encounter.  Review of Systems  Constitutional: Negative for fever, chills, diaphoresis, appetite change, fatigue and unexpected weight change.  Eyes: Negative for visual disturbance.  Respiratory: Negative for chest tightness and shortness of breath.   Cardiovascular: Positive for chest pain. Negative for palpitations and leg swelling.  Gastrointestinal: Negative for nausea, abdominal pain, diarrhea and constipation.  Skin: Negative for color change and rash.  Hematological: Negative for adenopathy. Does not bruise/bleed easily.  Psychiatric/Behavioral: Negative for sleep disturbance and dysphoric mood. The patient is not nervous/anxious.        Objective:    BP 168/72 mmHg  Pulse 108  Ht 5' 2.5" (1.588 m) Physical Exam  Constitutional: She is oriented to person, place, and time. She appears well-developed and well-nourished. No distress.  HENT:  Head: Normocephalic and atraumatic.  Right Ear: External ear normal.  Left Ear: External ear normal.  Nose: Nose normal.  Mouth/Throat: Oropharynx is clear and moist. No oropharyngeal exudate.  Eyes: Conjunctivae are normal. Pupils are equal, round, and reactive to light. Right eye exhibits no discharge. Left eye exhibits no discharge. No scleral icterus.  Neck: Normal range of motion. Neck supple. No tracheal deviation present. No thyromegaly present.  Cardiovascular: Normal rate, regular  rhythm, normal heart sounds and intact distal pulses.  Exam reveals no gallop and no friction rub.   No murmur heard. Pulmonary/Chest: Effort normal and breath sounds normal. No respiratory distress. She has no wheezes. She has no rales. She exhibits no tenderness.  Musculoskeletal: Normal range of motion. She exhibits no edema or tenderness.  Lymphadenopathy:    She has no cervical adenopathy.  Neurological: She is alert and oriented to person, place, and time. No cranial nerve deficit. She exhibits normal muscle tone. Coordination normal.  Skin: Skin is warm and dry. No rash noted. She is not diaphoretic. No erythema. No pallor.  Psychiatric: Her speech is normal and behavior is normal. Judgment and thought content normal. Her mood appears anxious. Cognition and memory are normal.          Assessment & Plan:   Problem List Items Addressed This Visit      Unprioritized   Chest heaviness - Primary    Recent episodes of chest heaviness and general malaise. Suspect anxiety playing a role. EKG showed no acute changes. Will check CMP, CBC, Troponin. Will set up cardiology evaluation for stress test.      Relevant Orders   CBC with Differential/Platelet   Comprehensive metabolic panel   TSH   Hemoglobin A1c   Lipid panel   Troponin I   Ambulatory referral to Cardiology   Generalized anxiety disorder    Will continue prn Lorazepam. She understands risks of using Benzodiazepines.      Hypertension    BP Readings from Last 3 Encounters:  08/30/14 168/72  07/24/14 148/76  06/08/14 128/70   BP slightly elevated today. Will increase Amlodipine to 2.70m bid.  Will set up cardiology evaluatoin.       Other Visit Diagnoses    Shortness of breath        Relevant Orders    EKG 12-Lead (Completed)        Return in about 2 weeks (around 09/13/2014).

## 2014-08-30 NOTE — Assessment & Plan Note (Signed)
Will continue prn Lorazepam. She understands risks of using Benzodiazepines.

## 2014-08-30 NOTE — Assessment & Plan Note (Signed)
Recent episodes of chest heaviness and general malaise. Suspect anxiety playing a role. EKG showed no acute changes. Will check CMP, CBC, Troponin. Will set up cardiology evaluation for stress test.

## 2014-08-30 NOTE — Telephone Encounter (Signed)
Pt to see Dr. Gilford Rile today, see office note.

## 2014-08-30 NOTE — Assessment & Plan Note (Signed)
BP Readings from Last 3 Encounters:  08/30/14 168/72  07/24/14 148/76  06/08/14 128/70   BP slightly elevated today. Will increase Amlodipine to 2.36m bid. Will set up cardiology evaluatoin.

## 2014-08-30 NOTE — Progress Notes (Signed)
Pre visit review using our clinic review tool, if applicable. No additional management support is needed unless otherwise documented below in the visit note. 

## 2014-08-30 NOTE — Telephone Encounter (Addendum)
Pt came in requesting to speak with Dr. Derrel Nip. Pt stated that she is having blood pressure issues. Pt filled out triage note. Note given to nurse.msn

## 2014-08-30 NOTE — Patient Instructions (Addendum)
We will set up an evaluation with Dr. Ubaldo Glassing in Cardiology.  Please continue Amlodipine 2.105m twice daily.  Continue Lorazepam 0.23mup to three times daily as needed for anxiety.  Labs today.

## 2014-09-03 DIAGNOSIS — I10 Essential (primary) hypertension: Secondary | ICD-10-CM | POA: Diagnosis not present

## 2014-09-03 DIAGNOSIS — R0789 Other chest pain: Secondary | ICD-10-CM | POA: Diagnosis not present

## 2014-09-07 ENCOUNTER — Ambulatory Visit: Payer: Medicare Other | Admitting: Internal Medicine

## 2014-09-10 DIAGNOSIS — H2513 Age-related nuclear cataract, bilateral: Secondary | ICD-10-CM | POA: Diagnosis not present

## 2014-09-12 ENCOUNTER — Encounter: Payer: Self-pay | Admitting: Internal Medicine

## 2014-09-12 ENCOUNTER — Ambulatory Visit (INDEPENDENT_AMBULATORY_CARE_PROVIDER_SITE_OTHER): Payer: Medicare Other | Admitting: Internal Medicine

## 2014-09-12 VITALS — BP 140/72 | HR 72 | Temp 98.4°F | Resp 14 | Ht 63.0 in | Wt 140.8 lb

## 2014-09-12 DIAGNOSIS — I10 Essential (primary) hypertension: Secondary | ICD-10-CM | POA: Diagnosis not present

## 2014-09-12 DIAGNOSIS — K5090 Crohn's disease, unspecified, without complications: Secondary | ICD-10-CM | POA: Diagnosis not present

## 2014-09-12 DIAGNOSIS — R0789 Other chest pain: Secondary | ICD-10-CM

## 2014-09-12 DIAGNOSIS — R7301 Impaired fasting glucose: Secondary | ICD-10-CM

## 2014-09-12 DIAGNOSIS — Z9225 Personal history of immunosupression therapy: Secondary | ICD-10-CM

## 2014-09-12 DIAGNOSIS — F411 Generalized anxiety disorder: Secondary | ICD-10-CM

## 2014-09-12 MED ORDER — LORAZEPAM 0.5 MG PO TABS: 0.5000 mg | ORAL_TABLET | Freq: Two times a day (BID) | ORAL | Status: DC

## 2014-09-12 MED ORDER — TETANUS-DIPHTH-ACELL PERTUSSIS 5-2.5-18.5 LF-MCG/0.5 IM SUSP: 0.5000 mL | Freq: Once | INTRAMUSCULAR | Status: DC

## 2014-09-12 NOTE — Progress Notes (Signed)
Patient ID: Kim Sanchez, female   DOB: 1940-08-20, 74 y.o.   MRN: [id number]   Patient Active Problem List   Diagnosis Date Noted  . History of immunosuppression therapy 09/15/2014  . Chest heaviness 08/30/2014  . Right knee pain 07/24/2014  . Generalized anxiety disorder 06/08/2014  . Hypertensive urgency 03/21/2014  . 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 Oral thrush 01/18/2014  . Impaired fasting blood sugar 01/18/2014  . Hyperglycemia 10/03/2013  . Medication management 05/12/2013  . Hypertension 05/05/2013  . Routine general medical examination at a health care facility 01/16/2013  . Screening for cervical cancer 01/18/2012  . Screening for colon cancer 01/18/2012  . Arthritis   . GERD (gastroesophageal reflux disease)   . History of blood transfusion   . Hyperlipidemia 11/27/2011  . Crohn's disease     Subjective:  CC:   Chief Complaint  Patient presents with  . Follow-up    129/70 this am before medication.with HR of 68. Dr. Ubaldo Glassing advised patient to icrease lorazepam to BID. Updated med list.  . Hypertension    HPI:   Kim Sanchez is a 74 y.o. female who presents for   Follow up on labile hypertension with recent episode of chest pain,  complicated by anxiety.  She was referred to Dr Ubaldo Glassing by Dr Gilford Rile when she presented in late April after having an episode of chest pain accompanied by elevated blood pressure.  She has multiple intolerances to medications and was prescribed amlodipine , which she was taking tid (2.5 mg dose) until she was seen by Dr Ubaldo Glassing.  She is scheduled to have a stress test  Later on this month.  . During her initial  visit with him, he advised to her to increase the lorazepam to twice daily, and reduce the amlodipine to bid.  since she has done so, she feels much better, and her home BP readings have improved with once daily dosing of amlodipine.  Her anxiety has been aggravated by recent stressors , including her husband's recent CVA.   She winessed him have a s tyncopal episode during a viral illness, and she thought he had died .  She has been afraid to leave him since this incident several months ago. She denies panic attacks.      Past Medical History  Diagnosis Date  . Crohn's disease   . Arthritis   . GERD (gastroesophageal reflux disease)   . History of blood transfusion 1960  . Hypertension   . 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    Past Surgical History  Procedure Laterality Date  . Dilation and curettage of uterus    . Brain surgery  1980    breast biopsies, benign  . Breast surgery  1980's    fibro cyst both breast in [place]  . Breast lumpectomy Right May 2014    T1c, N0; ER/PR positive, HER-2/neu not overexpressing.       The following portions of the patient's history were reviewed and updated as appropriate: Allergies, current medications, and problem list.    Review of Systems:   Patient denies headache, fevers, malaise, unintentional weight loss, skin rash, eye pain, sinus congestion and sinus pain, sore throat, dysphagia,  hemoptysis , cough, dyspnea, wheezing, chest pain, palpitations, orthopnea, edema, abdominal pain, nausea, melena, diarrhea, constipation, flank pain, dysuria, hematuria, urinary  Frequency, nocturia, numbness, tingling, seizures,  Focal weakness, Loss of consciousness,  Tremor, insomnia, depression, anxiety, and suicidal ideation.     History   Social History  . Marital Status: Married    Spouse Name: N/A  . Number of Children: N/A  . Years of Education: N/A   Occupational History  . Not on file.   Social History Main Topics  . Smoking status: Never Smoker   . Smokeless tobacco: Never Used  . Alcohol Use: No  . Drug Use: No  . Sexual Activity: Not Currently   Other Topics Concern  . Not on file    Social History Narrative    Objective:  Filed Vitals:   09/12/14 0833  BP: 140/72  Pulse: 72  Temp: 98.4 F (36.9 C)  Resp: 14     General appearance: alert, cooperative and appears stated age Ears: normal TM's and external ear canals both ears Throat: lips, mucosa, and tongue normal; teeth and gums normal Neck: no adenopathy, no carotid bruit, supple, symmetrical, trachea midline and thyroid not enlarged, symmetric, no tenderness/mass/nodules Back: symmetric, no curvature. ROM normal. No CVA tenderness. Lungs: clear to auscultation bilaterally Heart: regular rate and rhythm, S1, S2 normal, no murmur, click, rub or gallop Abdomen: soft, non-tender; bowel sounds normal; no masses,  no organomegaly Pulses: 2+ and symmetric Skin: Skin color, texture, turgor normal. No rashes or lesions Lymph nodes: Cervical, supraclavicular, and axillary nodes normal.  Assessment and Plan:  Crohn's disease Managed with Remicaide infusions every 2-3 months at [facility].Marland Kitchen Disease has been quiescent.    Hypertension Her amlodipine has been reduced to once daily .  No changes today.  Lab Results  Component Value Date   CREATININE 0.67 08/30/2014   Lab Results  Component Value Date   NA 138 08/30/2014   K 3.7 08/30/2014   CL 101 08/30/2014   CO2 28 08/30/2014      Impaired fasting blood sugar Her A1c was elevated , suggesting she has diet controlled DM.  She is not overweight and has a careful diet.  Will repeat in 6 months.  She has a documented intolerance to lisinopril and no proteinuria by Dec 2015 assessment.    Lab Results  Component Value Date   HGBA1C 6.1 08/30/2014   Lab Results  Component Value Date   MICROALBUR 0.4 04/11/2014      Chest heaviness Recent occurrence, in the setting of aniety,  No recurrence with increased use of lorazepam.,  Has been referred to [facility] and is scheduled For stress test and ECHO may 30 by Jordan Hawks   Generalized anxiety  disorder Improved symptom control with lorazepam bid. The risks and benefits of benzodiazepine use were reviewed with patient today including excessive sedation leading to respiratory depression,  impaired thinking/driving, and addiction.  Patient was advised to avoid concurrent use with alcohol, to use medication only as needed and not to share with others  .    History of immunosuppression therapy She has had recurrent thrush and 2 upper respiratory infections resulting in the use of  two courses of antibiotics by [facility] in the last 6 months.  Advised to use probiotics for a minimum of 3 weeks when prescribed antibiotics  to mitigate risk of C dificile colitis.    A total of 40 minutes was spent with patient more than half of which was spent in counseling patient on the above mentioned issues , reviewing and explaining recent labs and imaging studies done,  and coordination of care.  Updated Medication List Outpatient Encounter Prescriptions as of 09/12/2014  Medication Sig  . amLODipine (NORVASC) 2.5 MG tablet TAKE 1 TABLET BY MOUTH TWICE DAILY (Patient taking differently: TAKE 1 TABLET BY MOUTH ONCE DAILY)  . anastrozole (ARIMIDEX) 1 MG tablet Take 1 tablet by mouth daily.  . Biotin 10 MG CAPS Take by mouth.  . Calcium Carb-Cholecalciferol (CALCIUM + D3) 600-200 MG-UNIT TABS Take by mouth daily.   . Cholecalciferol (VITAMIN D3) 2000 UNITS TABS Take 1 tablet by mouth daily.  . Fiber CHEW Chew by mouth as needed.  . fish oil-omega-3 fatty acids 1000 MG capsule Take 2 g by mouth daily.  Marland Kitchen ibuprofen (ADVIL,MOTRIN) 200 MG tablet Take 200 mg by mouth every 6 (six) hours as needed.  . inFLIXimab (REMICADE) 100 MG injection Inject 1 mg into the vein every 8 (eight) weeks.  Marland Kitchen LORazepam (ATIVAN) 0.5 MG tablet Take 1 tablet (0.5 mg total) by mouth 2 (two) times daily.  . Multiple Vitamin (MULTIVITAMIN) tablet Take 1 tablet by mouth daily.  Marland Kitchen nystatin (MYCOSTATIN) 100000 UNIT/ML  suspension Take 5 mLs (500,000 Units total) by mouth 4 (four) times daily.  . polyethylene glycol (MIRALAX / GLYCOLAX) packet Take 17 g by mouth daily as needed.   . Red Yeast Rice 600 MG CAPS Take by mouth daily.   . [DISCONTINUED] LORazepam (ATIVAN) 0.5 MG tablet TAKE 1 TABLET BY MOUTH EVERY DAY AS NEEDED (Patient taking differently: Take 1 Tablet Twice daily.)  . Tdap (BOOSTRIX) 5-2.5-18.5 LF-MCG/0.5 injection Inject 0.5 mLs into the muscle once.   No facility-administered encounter medications on file as of 09/12/2014.     No orders of the defined types were placed in this encounter.    No Follow-up on file.

## 2014-09-12 NOTE — Progress Notes (Signed)
Pre-visit discussion using our clinic review tool. No additional management support is needed unless otherwise documented below in the visit note.  

## 2014-09-12 NOTE — Patient Instructions (Signed)
You are doing well. No changes to medications today.  .Your still need your tetanus-diptheria-pertussis vaccine (TDaP) but you can get it for less $$$ at a local pharmacy with the script I have provided you.

## 2014-09-15 ENCOUNTER — Encounter: Payer: Self-pay | Admitting: Internal Medicine

## 2014-09-15 DIAGNOSIS — Z79899 Other long term (current) drug therapy: Secondary | ICD-10-CM | POA: Insufficient documentation

## 2014-09-15 DIAGNOSIS — D84821 Immunodeficiency due to drugs: Secondary | ICD-10-CM | POA: Insufficient documentation

## 2014-09-15 NOTE — Assessment & Plan Note (Signed)
Her A1c was elevated , suggesting she has diet controlled DM.  She is not overweight and has a careful diet.  Will repeat in 6 months.  She has a documented intolerance to lisinopril and no proteinuria by Dec 2015 assessment.    Lab Results  Component Value Date   HGBA1C 6.1 08/30/2014   Lab Results  Component Value Date   MICROALBUR 0.4 04/11/2014

## 2014-09-15 NOTE — Assessment & Plan Note (Signed)
Managed with Remicaide infusions every 2-3 months at [facility].Marland Kitchen Disease has been quiescent.

## 2014-09-15 NOTE — Assessment & Plan Note (Signed)
Improved symptom control with lorazepam bid. The risks and benefits of benzodiazepine use were reviewed with patient today including excessive sedation leading to respiratory depression,  impaired thinking/driving, and addiction.  Patient was advised to avoid concurrent use with alcohol, to use medication only as needed and not to share with others  .

## 2014-09-15 NOTE — Assessment & Plan Note (Signed)
She has recurrent thrush and Uris and has received two courses of antibiotics by [facility] in the last 6 months.  Advised to use probiotics for a minimum of 3 weeks when prescribed antibiotics  to mitigate risk of C dificile colitis.

## 2014-09-15 NOTE — Assessment & Plan Note (Addendum)
Recent occurrence, in the setting of aniety,  No recurrence with increased use of lorazepam.,  Has been referred to [facility] and is scheduled For stress test and ECHO may 30 by Jordan Hawks

## 2014-09-15 NOTE — Assessment & Plan Note (Addendum)
Her amlodipine has been reduced to once daily .  No changes today.  Lab Results  Component Value Date   CREATININE 0.67 08/30/2014   Lab Results  Component Value Date   NA 138 08/30/2014   K 3.7 08/30/2014   CL 101 08/30/2014   CO2 28 08/30/2014

## 2014-09-18 DIAGNOSIS — R0789 Other chest pain: Secondary | ICD-10-CM | POA: Diagnosis not present

## 2014-09-21 DIAGNOSIS — Z853 Personal history of malignant neoplasm of breast: Secondary | ICD-10-CM | POA: Diagnosis not present

## 2014-09-21 DIAGNOSIS — C50011 Malignant neoplasm of nipple and areola, right female breast: Secondary | ICD-10-CM | POA: Diagnosis not present

## 2014-09-25 DIAGNOSIS — K5090 Crohn's disease, unspecified, without complications: Secondary | ICD-10-CM | POA: Diagnosis not present

## 2014-10-02 ENCOUNTER — Ambulatory Visit (INDEPENDENT_AMBULATORY_CARE_PROVIDER_SITE_OTHER): Payer: Medicare Other | Admitting: General Surgery

## 2014-10-02 ENCOUNTER — Encounter: Payer: Self-pay | Admitting: General Surgery

## 2014-10-02 VITALS — BP 160/82 | HR 78 | Resp 12 | Ht 63.0 in | Wt 139.0 lb

## 2014-10-02 DIAGNOSIS — C50911 Malignant neoplasm of unspecified site of right female breast: Secondary | ICD-10-CM | POA: Diagnosis not present

## 2014-10-02 DIAGNOSIS — R0789 Other chest pain: Secondary | ICD-10-CM | POA: Diagnosis not present

## 2014-10-02 DIAGNOSIS — I10 Essential (primary) hypertension: Secondary | ICD-10-CM | POA: Diagnosis not present

## 2014-10-02 NOTE — Patient Instructions (Signed)
The patient has been asked to return to the office in one year with a bilateral diagnostic mammogram.

## 2014-10-02 NOTE — Progress Notes (Signed)
Patient ID: Kim Sanchez, female   DOB: Jun 22, 1940, 74 y.o.   MRN: [id number]  Chief Complaint  Patient presents with  . Follow-up    Mammogram    HPI Kim Sanchez is a 74 y.o. female who presents for a breast evaluation. The most recent mammogram was done on 09/21/14 .  Patient does perform regular self breast checks and gets regular mammograms done.    HPI  Past Medical History  Diagnosis Date  . Crohn's disease   . Arthritis   . GERD (gastroesophageal reflux disease)   . History of blood transfusion 1960  . Hypertension   . 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    Past Surgical History  Procedure Laterality Date  . Dilation and curettage of uterus    . Brain surgery  1980    breast biopsies, benign  . Breast surgery  1980's    fibro cyst both breast in [place]  . Breast lumpectomy Right May 2014    T1c, N0; ER/PR positive, HER-2/neu not overexpressing.  . Colonoscopy      Family History  Problem Relation Age of Onset  . Cancer Paternal Grandmother     Social History History  Substance Use Topics  . Smoking status: Never Smoker   . Smokeless tobacco: Never Used  . Alcohol Use: No    Allergies  Allergen Reactions  . Letrozole Anaphylaxis and Itching  . Exemestane Nausea Only  . Hydrochlorothiazide Other (See Comments)  . Lisinopril Cough  . Sulfa Antibiotics Other (See Comments)    Current Outpatient Prescriptions  Medication Sig Dispense Refill  . amLODipine (NORVASC) 2.5 MG tablet TAKE 1 TABLET BY MOUTH TWICE DAILY (Patient taking differently: TAKE 1 TABLET BY MOUTH ONCE DAILY) 60 tablet 5  . anastrozole (ARIMIDEX) 1 MG tablet Take 1 tablet by mouth daily.    . Biotin 10 MG CAPS Take by mouth.    . Calcium Carb-Cholecalciferol (CALCIUM + D3) 600-200 MG-UNIT TABS Take by mouth daily.     .  Cholecalciferol (VITAMIN D3) 2000 UNITS TABS Take 1 tablet by mouth daily.    . Fiber CHEW Chew by mouth as needed.    . fish oil-omega-3 fatty acids 1000 MG capsule Take 2 g by mouth daily.    Marland Kitchen ibuprofen (ADVIL,MOTRIN) 200 MG tablet Take 200 mg by mouth every 6 (six) hours as needed.    . inFLIXimab (REMICADE) 100 MG injection Inject 1 mg into the vein every 8 (eight) weeks.    Marland Kitchen LORazepam (ATIVAN) 0.5 MG tablet Take 1 tablet (0.5 mg total) by mouth 2 (two) times daily. 60 tablet 5  . Multiple Vitamin (MULTIVITAMIN) tablet Take 1 tablet by mouth daily.    Marland Kitchen nystatin (MYCOSTATIN) 100000 UNIT/ML suspension Take 5 mLs (500,000 Units total) by mouth 4 (four) times daily. 120 mL 0  . polyethylene glycol (MIRALAX / GLYCOLAX) packet Take 17 g by mouth daily as needed.     . Red Yeast Rice 600 MG CAPS Take by mouth daily.     . Tdap (BOOSTRIX) 5-2.5-18.5 LF-MCG/0.5 injection Inject 0.5 mLs into the muscle once. 0.5 mL 0   No current facility-administered medications for this visit.    Review of Systems Review of Systems  Constitutional: Negative.   Respiratory: Negative.   Cardiovascular: Negative.     Blood pressure 160/82, pulse 78,  resp. rate 12, height 5' 3"  (1.6 m), weight 139 lb (63.05 kg).  Physical Exam Physical Exam  Constitutional: She is oriented to person, place, and time. She appears well-developed and well-nourished.  Eyes: Conjunctivae are normal. No scleral icterus.  Neck: Neck supple.  Cardiovascular: Normal rate and regular rhythm.   Murmur heard.  Systolic murmur is present with a grade of 2/6  Pulmonary/Chest: Effort normal and breath sounds normal. Right breast exhibits no inverted nipple, no mass, no nipple discharge, no skin change and no tenderness. Left breast exhibits no inverted nipple, no mass, no nipple discharge, no skin change and no tenderness.  Right breast incision well healed   Lymphadenopathy:    She has no cervical adenopathy.    She has no axillary  adenopathy.  Neurological: She is alert and oriented to person, place, and time.  Skin: Skin is warm and dry.    Data Reviewed Bilateral diagnostic mammograms dated 09/21/2014 completed at [facility] were reviewed. Postsurgical changes are identified. BI-RADS-2.  Assessment    The patient continues to do well.    Plan    The patient is experiencing some vasomotor symptoms and sleep disturbance. We discussed the possibility use of Effexor for the symptoms. At present there not so significant as to require intervention by her report. The patient has been asked to return to the office in one year with a bilateral diagnostic mammogram.     PCP:  Mattie Marlin 10/03/2014, 1:30 PM

## 2014-10-03 DIAGNOSIS — Z853 Personal history of malignant neoplasm of breast: Secondary | ICD-10-CM | POA: Insufficient documentation

## 2014-10-24 ENCOUNTER — Other Ambulatory Visit: Payer: Self-pay | Admitting: Internal Medicine

## 2014-10-24 ENCOUNTER — Telehealth: Payer: Self-pay | Admitting: *Deleted

## 2014-10-24 ENCOUNTER — Emergency Department: Payer: Medicare Other

## 2014-10-24 ENCOUNTER — Encounter: Payer: Self-pay | Admitting: Emergency Medicine

## 2014-10-24 ENCOUNTER — Emergency Department
Admission: EM | Admit: 2014-10-24 | Discharge: 2014-10-24 | Disposition: A | Discharge status: Home / Self Care | Payer: Medicare Other | Attending: Emergency Medicine | Admitting: Emergency Medicine

## 2014-10-24 DIAGNOSIS — S8991XA Unspecified injury of right lower leg, initial encounter: Secondary | ICD-10-CM | POA: Diagnosis present

## 2014-10-24 DIAGNOSIS — I10 Essential (primary) hypertension: Secondary | ICD-10-CM | POA: Insufficient documentation

## 2014-10-24 DIAGNOSIS — S8011XA Contusion of right lower leg, initial encounter: Secondary | ICD-10-CM | POA: Diagnosis not present

## 2014-10-24 DIAGNOSIS — Z23 Encounter for immunization: Secondary | ICD-10-CM | POA: Insufficient documentation

## 2014-10-24 DIAGNOSIS — M7989 Other specified soft tissue disorders: Secondary | ICD-10-CM | POA: Diagnosis not present

## 2014-10-24 DIAGNOSIS — Z79899 Other long term (current) drug therapy: Secondary | ICD-10-CM | POA: Insufficient documentation

## 2014-10-24 DIAGNOSIS — Y998 Other external cause status: Secondary | ICD-10-CM | POA: Insufficient documentation

## 2014-10-24 DIAGNOSIS — Y92043 Driveway of boarding-house as the place of occurrence of the external cause: Secondary | ICD-10-CM | POA: Diagnosis not present

## 2014-10-24 DIAGNOSIS — Y9389 Activity, other specified: Secondary | ICD-10-CM | POA: Insufficient documentation

## 2014-10-24 DIAGNOSIS — M79661 Pain in right lower leg: Secondary | ICD-10-CM | POA: Diagnosis not present

## 2014-10-24 NOTE — Telephone Encounter (Signed)
Last OV 5.11.16, next OV 6.24.16. Advise refill
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
## 2014-10-24 NOTE — Telephone Encounter (Signed)
Pt came in the clinic states she has injured her leg.  Further states she has bruising and swelling.  Spoke with pt, pt is ambulatory; offered pt an appoint at [facility].  She declined.  Advised pt she would need to be evaluated at [facility] or ER.  Pt verbalized understanding
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
## 2014-10-24 NOTE — Discharge Instructions (Signed)
As we discussed, you have no broken bones, and there is currently no sign of compartment syndrome.  Please read through the included information about compartment syndrome, however, and return immediately if you are concerned that your symptoms are worsening.  Please follow up as recommended with Dr. Roland Rack in clinic.  Continue to keep your leg elevated, use ice packs, and take over-the-counter pain medicine as needed.   Contusion A contusion is a deep bruise. Contusions are the result of an injury that caused bleeding under the skin. The contusion may turn blue, purple, or yellow. Minor injuries will give you a painless contusion, but more severe contusions may stay painful and swollen for a few weeks.  CAUSES  A contusion is usually caused by a blow, trauma, or direct force to an area of the body. SYMPTOMS   Swelling and redness of the injured area.  Bruising of the injured area.  Tenderness and soreness of the injured area.  Pain. DIAGNOSIS  The diagnosis can be made by taking a history and physical exam. An X-ray, CT scan, or MRI may be needed to determine if there were any associated injuries, such as fractures. TREATMENT  Specific treatment will depend on what area of the body was injured. In general, the best treatment for a contusion is resting, icing, elevating, and applying cold compresses to the injured area. Over-the-counter medicines may also be recommended for pain control. Ask your caregiver what the best treatment is for your contusion. HOME CARE INSTRUCTIONS   Put ice on the injured area.  Put ice in a plastic bag.  Place a towel between your skin and the bag.  Leave the ice on for 15-20 minutes, 3-4 times a day, or as directed by your health care provider.  Only take over-the-counter or prescription medicines for pain, discomfort, or fever as directed by your caregiver. Your caregiver may recommend avoiding anti-inflammatory medicines (aspirin, ibuprofen, and naproxen)  for 48 hours because these medicines may increase bruising.  Rest the injured area.  If possible, elevate the injured area to reduce swelling. SEEK IMMEDIATE MEDICAL CARE IF:   You have increased bruising or swelling.  You have pain that is getting worse.  Your swelling or pain is not relieved with medicines. MAKE SURE YOU:   Understand these instructions.  Will watch your condition.  Will get help right away if you are not doing well or get worse. Document Released: 01/28/2005 Document Revised: 04/25/2013 Document Reviewed: 02/23/2011 [facility] Patient Information 2015 [place], [place]. This information is not intended to replace advice given to you by your health care provider. Make sure you discuss any questions you have with your health care provider.
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
## 2014-10-25 DIAGNOSIS — S8011XA Contusion of right lower leg, initial encounter: Secondary | ICD-10-CM | POA: Diagnosis not present

## 2014-10-25 NOTE — Telephone Encounter (Signed)
Ok to refill,  printed rx  

## 2014-10-25 NOTE — Telephone Encounter (Signed)
Rx faxed to [facility]

## 2014-10-26 ENCOUNTER — Encounter: Payer: Medicare Other | Admitting: Internal Medicine

## 2014-11-08 DIAGNOSIS — S8011XD Contusion of right lower leg, subsequent encounter: Secondary | ICD-10-CM | POA: Diagnosis not present

## 2014-11-20 DIAGNOSIS — K5090 Crohn's disease, unspecified, without complications: Secondary | ICD-10-CM | POA: Diagnosis not present

## 2014-11-26 ENCOUNTER — Telehealth: Payer: Self-pay | Admitting: Internal Medicine

## 2014-11-26 MED ORDER — LORAZEPAM 0.5 MG PO TABS: 0.5000 mg | ORAL_TABLET | Freq: Two times a day (BID) | ORAL | Status: DC

## 2014-11-26 NOTE — Telephone Encounter (Signed)
Lorazepam rx changed to #60/month

## 2014-11-26 NOTE — Telephone Encounter (Signed)
Last OV was 5.11.16  Needs refill and change on Loraepam to cover 60pills a month with dosage change from Dr. Ubaldo Glassing.  He also told her to discontinue her Norvasc.  Please advise for refill?

## 2014-11-26 NOTE — Telephone Encounter (Signed)
thanks

## 2014-11-26 NOTE — Telephone Encounter (Signed)
Referred to Dr. Ubaldo Glassing patient ordered to stop taking her blood pressure medication amlodipine. She was instructed to increase her lorazepam to twice a day . Per the patient she is needing her medication increased to 60 tablets per Month .

## 2014-12-10 DIAGNOSIS — S8011XD Contusion of right lower leg, subsequent encounter: Secondary | ICD-10-CM | POA: Diagnosis not present

## 2014-12-10 LAB — HM PAP SMEAR: HM PAP: NORMAL

## 2014-12-13 ENCOUNTER — Encounter: Payer: Self-pay | Admitting: Internal Medicine

## 2014-12-13 ENCOUNTER — Other Ambulatory Visit (HOSPITAL_COMMUNITY)
Admission: RE | Admit: 2014-12-13 | Discharge: 2014-12-13 | Disposition: A | Discharge status: Home / Self Care | Payer: Medicare Other | Source: Ambulatory Visit | Attending: Internal Medicine | Admitting: Internal Medicine

## 2014-12-13 ENCOUNTER — Ambulatory Visit (INDEPENDENT_AMBULATORY_CARE_PROVIDER_SITE_OTHER): Payer: Medicare Other | Admitting: Internal Medicine

## 2014-12-13 VITALS — BP 158/84 | HR 91 | Temp 98.3°F | Resp 14 | Ht 62.0 in | Wt 135.8 lb

## 2014-12-13 DIAGNOSIS — Z124 Encounter for screening for malignant neoplasm of cervix: Secondary | ICD-10-CM | POA: Insufficient documentation

## 2014-12-13 DIAGNOSIS — Z0000 Encounter for general adult medical examination without abnormal findings: Secondary | ICD-10-CM

## 2014-12-13 DIAGNOSIS — Z1151 Encounter for screening for human papillomavirus (HPV): Secondary | ICD-10-CM | POA: Diagnosis not present

## 2014-12-13 DIAGNOSIS — R739 Hyperglycemia, unspecified: Secondary | ICD-10-CM

## 2014-12-13 MED ORDER — TETANUS-DIPHTH-ACELL PERTUSSIS 5-2.5-18.5 LF-MCG/0.5 IM SUSP: 0.5000 mL | Freq: Once | INTRAMUSCULAR | Status: DC
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
## 2014-12-13 NOTE — Patient Instructions (Signed)
Your blood tests in the past indicate  rhat you have adult onset diabetes, but you do not need medication. ;  Please make an appt to follow up in 3 months.  In the meantime,  Type 2 Diabetes can be managed with a  low glycemic index diet and regular exercise. Return in 3 months.    Type 2 Diabetes Mellitus Type 2 diabetes mellitus, often simply referred to as type 2 diabetes, is a long-lasting (chronic) disease. In type 2 diabetes, the pancreas does not make enough insulin (a hormone), the cells are less responsive to the insulin that is made (insulin resistance), or both. Normally, insulin moves sugars from food into the tissue cells. The tissue cells use the sugars for energy. The lack of insulin or the lack of normal response to insulin causes excess sugars to build up in the blood instead of going into the tissue cells. As a result, high blood sugar (hyperglycemia) develops. The effect of high sugar (glucose) levels can cause many complications. Type 2 diabetes was also previously called adult-onset diabetes, but it can occur at any age.  RISK FACTORS  A person is predisposed to developing type 2 diabetes if someone in the family has the disease and also has one or more of the following primary risk factors:  Overweight.  An inactive lifestyle.  A history of consistently eating high-calorie foods. Maintaining a normal weight and regular physical activity can reduce the chance of developing type 2 diabetes. SYMPTOMS  A person with type 2 diabetes may not show symptoms initially. The symptoms of type 2 diabetes appear slowly. The symptoms include:  Increased thirst (polydipsia).  Increased urination (polyuria).  Increased urination during the night (nocturia).  Weight loss. This weight loss may be rapid.  Frequent, recurring infections.  Tiredness (fatigue).  Weakness.  Vision changes, such as blurred vision.  Fruity smell to your breath.  Abdominal pain.  Nausea or  vomiting.  Cuts or bruises which are slow to heal.  Tingling or numbness in the hands or feet. DIAGNOSIS Type 2 diabetes is frequently not diagnosed until complications of diabetes are present. Type 2 diabetes is diagnosed when symptoms or complications are present and when blood glucose levels are increased. Your blood glucose level may be checked by one or more of the following blood tests:  A fasting blood glucose test. You will not be allowed to eat for at least 8 hours before a blood sample is taken.  A random blood glucose test. Your blood glucose is checked at any time of the day regardless of when you ate.  A hemoglobin A1c blood glucose test. A hemoglobin A1c test provides information about blood glucose control over the previous 3 months.  An oral glucose tolerance test (OGTT). Your blood glucose is measured after you have not eaten (fasted) for 2 hours and then after you drink a glucose-containing beverage. TREATMENT   You may need to take insulin or diabetes medicine daily to keep blood glucose levels in the desired range.  If you use insulin, you may need to adjust the dosage depending on the carbohydrates that you eat with each meal or snack. The treatment goal is to maintain the before meal blood sugar (preprandial glucose) level at 70-130 mg/dL. HOME CARE INSTRUCTIONS   Have your hemoglobin A1c level checked twice a year.  Perform daily blood glucose monitoring as directed by your health care provider.  Monitor urine ketones when you are ill and as directed by your health care  provider.  Take your diabetes medicine or insulin as directed by your health care provider to maintain your blood glucose levels in the desired range.  Never run out of diabetes medicine or insulin. It is needed every day.  If you are using insulin, you may need to adjust the amount of insulin given based on your intake of carbohydrates. Carbohydrates can raise blood glucose levels but need to  be included in your diet. Carbohydrates provide vitamins, minerals, and fiber which are an essential part of a healthy diet. Carbohydrates are found in fruits, vegetables, whole grains, dairy products, legumes, and foods containing added sugars.  Eat healthy foods. You should make an appointment to see a registered dietitian to help you create an eating plan that is right for you.  Lose weight if you are overweight.  Carry a medical alert card or wear your medical alert jewelry.  Carry a 15-gram carbohydrate snack with you at all times to treat low blood glucose (hypoglycemia). Some examples of 15-gram carbohydrate snacks include:  Glucose tablets, 3 or 4.  Glucose gel, 15-gram tube.  Raisins, 2 tablespoons (24 grams).  Jelly beans, 6.  Animal crackers, 8.  Regular pop, 4 ounces (120 mL).  Gummy treats, 9.  Recognize hypoglycemia. Hypoglycemia occurs with blood glucose levels of 70 mg/dL and below. The risk for hypoglycemia increases when fasting or skipping meals, during or after intense exercise, and during sleep. Hypoglycemia symptoms can include:  Tremors or shakes.  Decreased ability to concentrate.  Sweating.  Increased heart rate.  Headache.  Dry mouth.  Hunger.  Irritability.  Anxiety.  Restless sleep.  Altered speech or coordination.  Confusion.  Treat hypoglycemia promptly. If you are alert and able to safely swallow, follow the 15:15 rule:  Take 15-20 grams of rapid-acting glucose or carbohydrate. Rapid-acting options include glucose gel, glucose tablets, or 4 ounces (120 mL) of fruit juice, regular soda, or low-fat milk.  Check your blood glucose level 15 minutes after taking the glucose.  Take 15-20 grams more of glucose if the repeat blood glucose level is still 70 mg/dL or below.  Eat a meal or snack within 1 hour once blood glucose levels return to normal.  Be alert to feeling very thirsty and urinating more frequently than usual, which are  early signs of hyperglycemia. An early awareness of hyperglycemia allows for prompt treatment. Treat hyperglycemia as directed by your health care provider.  Engage in at least 150 minutes of moderate-intensity physical activity a week, spread over at least 3 days of the week or as directed by your health care provider. In addition, you should engage in resistance exercise at least 2 times a week or as directed by your health care provider. Try to spend no more than 90 minutes at one time inactive.  Adjust your medicine and food intake as needed if you start a new exercise or sport.  Follow your sick-day plan anytime you are unable to eat or drink as usual.  Do not use any tobacco products including cigarettes, chewing tobacco, or electronic cigarettes. If you need help quitting, ask your health care provider.  Limit alcohol intake to no more than 1 drink per day for nonpregnant women and 2 drinks per day for men. You should drink alcohol only when you are also eating food. Talk with your health care provider whether alcohol is safe for you. Tell your health care provider if you drink alcohol several times a week.  Keep all follow-up visits as directed  by your health care provider. This is important.  Schedule an eye exam soon after the diagnosis of type 2 diabetes and then annually.  Perform daily skin and foot care. Examine your skin and feet daily for cuts, bruises, redness, nail problems, bleeding, blisters, or sores. A foot exam by a health care provider should be done annually.  Brush your teeth and gums at least twice a day and floss at least once a day. Follow up with your dentist regularly.  Share your diabetes management plan with your workplace or school.  Stay up-to-date with immunizations. It is recommended that people with diabetes who are over 7 years old get the pneumonia vaccine. In some cases, two separate shots may be given. Ask your health care provider if your pneumonia  vaccination is up-to-date.  Learn to manage stress.  Obtain ongoing diabetes education and support as needed.  Participate in or seek rehabilitation as needed to maintain or improve independence and quality of life. Request a physical or occupational therapy referral if you are having foot or hand numbness, or difficulties with grooming, dressing, eating, or physical activity. SEEK MEDICAL CARE IF:   You are unable to eat food or drink fluids for more than 6 hours.  You have nausea and vomiting for more than 6 hours.  Your blood glucose level is over 240 mg/dL.  There is a change in mental status.  You develop an additional serious illness.  You have diarrhea for more than 6 hours.  You have been sick or have had a fever for a couple of days and are not getting better.  You have pain during any physical activity.  SEEK IMMEDIATE MEDICAL CARE IF:  You have difficulty breathing.  You have moderate to large ketone levels. MAKE SURE YOU:  Understand these instructions.  Will watch your condition.  Will get help right away if you are not doing well or get worse. Document Released: 04/20/2005 Document Revised: 09/04/2013 Document Reviewed: 11/17/2011  Health Maintenance Adopting a healthy lifestyle and getting preventive care can go a long way to promote health and wellness. Talk with your health care provider about what schedule of regular examinations is right for you. This is a good chance for you to check in with your provider about disease prevention and staying healthy. In between checkups, there are plenty of things you can do on your own. Experts have done a lot of research about which lifestyle changes and preventive measures are most likely to keep you healthy. Ask your health care provider for more information. WEIGHT AND DIET  Eat a healthy diet Be sure to include plenty of vegetables, fruits, low-fat dairy products, and lean protein. Do not eat a lot of foods high  in solid fats, added sugars, or salt. Get regular exercise. This is one of the most important things you can do for your health. Most adults should exercise for at least 150 minutes each week. The exercise should increase your heart rate and make you sweat (moderate-intensity exercise). Most adults should also do strengthening exercises at least twice a week. This is in addition to the moderate-intensity exercise.  Maintain a healthy weight Body mass index (BMI) is a measurement that can be used to identify possible weight problems. It estimates body fat based on height and weight. Your health care provider can help determine your BMI and help you achieve or maintain a healthy weight. For females 4 years of age and older:  A BMI below 18.5 is considered  underweight. A BMI of 18.5 to 24.9 is normal. A BMI of 25 to 29.9 is considered overweight. A BMI of 30 and above is considered obese.  Watch levels of cholesterol and blood lipids You should start having your blood tested for lipids and cholesterol at 74 years of age, then have this test every 5 years. You may need to have your cholesterol levels checked more often if: Your lipid or cholesterol levels are high. You are older than 74 years of age. You are at high risk for heart disease.  CANCER SCREENING   Lung Cancer Lung cancer screening is recommended for adults 60-65 years old who are at high risk for lung cancer because of a history of smoking. A yearly low-dose CT scan of the lungs is recommended for people who: Currently smoke. Have quit within the past 15 years. Have at least a 30-pack-year history of smoking. A pack year is smoking an average of one pack of cigarettes a day for 1 year. Yearly screening should continue until it has been 15 years since you quit. Yearly screening should stop if you develop a health problem that would prevent you from having lung cancer treatment.  Breast Cancer Practice breast self-awareness.  This means understanding how your breasts normally appear and feel. It also means doing regular breast self-exams. Let your health care provider know about any changes, no matter how small. If you are in your 20s or 30s, you should have a clinical breast exam (CBE) by a health care provider every 1-3 years as part of a regular health exam. If you are 68 or older, have a CBE every year. Also consider having a breast X-ray (mammogram) every year. If you have a family history of breast cancer, talk to your health care provider about genetic screening. If you are at high risk for breast cancer, talk to your health care provider about having an MRI and a mammogram every year. Breast cancer gene (BRCA) assessment is recommended for women who have family members with BRCA-related cancers. BRCA-related cancers include: Breast. Ovarian. Tubal. Peritoneal cancers. Results of the assessment will determine the need for genetic counseling and BRCA1 and BRCA2 testing. Cervical Cancer Routine pelvic examinations to screen for cervical cancer are no longer recommended for nonpregnant women who are considered low risk for cancer of the pelvic organs (ovaries, uterus, and vagina) and who do not have symptoms. A pelvic examination may be necessary if you have symptoms including those associated with pelvic infections. Ask your health care provider if a screening pelvic exam is right for you.  The Pap test is the screening test for cervical cancer for women who are considered at risk. If you had a hysterectomy for a problem that was not cancer or a condition that could lead to cancer, then you no longer need Pap tests. If you are older than 65 years, and you have had normal Pap tests for the past 10 years, you no longer need to have Pap tests. If you have had past treatment for cervical cancer or a condition that could lead to cancer, you need Pap tests and screening for cancer for at least 20 years after your  treatment. If you no longer get a Pap test, assess your risk factors if they change (such as having a new sexual partner). This can affect whether you should start being screened again. Some women have medical problems that increase their chance of getting cervical cancer. If this is the case for you, your  health care provider may recommend more frequent screening and Pap tests. The human papillomavirus (HPV) test is another test that may be used for cervical cancer screening. The HPV test looks for the virus that can cause cell changes in the cervix. The cells collected during the Pap test can be tested for HPV. The HPV test can be used to screen women 33 years of age and older. Getting tested for HPV can extend the interval between normal Pap tests from three to five years. An HPV test also should be used to screen women of any age who have unclear Pap test results. After 74 years of age, women should have HPV testing as often as Pap tests.  Colorectal Cancer This type of cancer can be detected and often prevented. Routine colorectal cancer screening usually begins at 74 years of age and continues through 74 years of age. Your health care provider may recommend screening at an earlier age if you have risk factors for colon cancer. Your health care provider may also recommend using home test kits to check for hidden blood in the stool. A small camera at the end of a tube can be used to examine your colon directly (sigmoidoscopy or colonoscopy). This is done to check for the earliest forms of colorectal cancer. Routine screening usually begins at age 44. Direct examination of the colon should be repeated every 5-10 years through 74 years of age. However, you may need to be screened more often if early forms of precancerous polyps or small growths are found. Skin Cancer Check your skin from head to toe regularly. Tell your health care provider about any new moles or changes in moles, especially if  there is a change in a mole's shape or color. Also tell your health care provider if you have a mole that is larger than the size of a pencil eraser. Always use sunscreen. Apply sunscreen liberally and repeatedly throughout the day. Protect yourself by wearing long sleeves, pants, a wide-brimmed hat, and sunglasses whenever you are outside. HEART DISEASE, DIABETES, AND HIGH BLOOD PRESSURE  Have your blood pressure checked at least every 1-2 years. High blood pressure causes heart disease and increases the risk of stroke. If you are between 62 years and 57 years old, ask your health care provider if you should take aspirin to prevent strokes. Have regular diabetes screenings. This involves taking a blood sample to check your fasting blood sugar level. If you are at a normal weight and have a low risk for diabetes, have this test once every three years after 74 years of age. If you are overweight and have a high risk for diabetes, consider being tested at a younger age or more often. PREVENTING INFECTION  Hepatitis B If you have a higher risk for hepatitis B, you should be screened for this virus. You are considered at high risk for hepatitis B if: You were born in a country where hepatitis B is common. Ask your health care provider which countries are considered high risk. Your parents were born in a high-risk country, and you have not been immunized against hepatitis B (hepatitis B vaccine). You have HIV or AIDS. You use needles to inject street drugs. You live with someone who has hepatitis B. You have had sex with someone who has hepatitis B. You get hemodialysis treatment. You take certain medicines for conditions, including cancer, organ transplantation, and autoimmune conditions. Hepatitis C Blood testing is recommended for: Everyone born from [birth year] through 1965. Anyone with  known risk factors for hepatitis C. Sexually transmitted infections (STIs) You should be screened for sexually  transmitted infections (STIs) including gonorrhea and chlamydia if: You are sexually active and are younger than 74 years of age. You are older than 74 years of age and your health care provider tells you that you are at risk for this type of infection. Your sexual activity has changed since you were last screened and you are at an increased risk for chlamydia or gonorrhea. Ask your health care provider if you are at risk. If you do not have HIV, but are at risk, it may be recommended that you take a prescription medicine daily to prevent HIV infection. This is called pre-exposure prophylaxis (PrEP). You are considered at risk if: You are sexually active and do not regularly use condoms or know the HIV status of your partner(s). You take drugs by injection. You are sexually active with a partner who has HIV. Talk with your health care provider about whether you are at high risk of being infected with HIV. If you choose to begin PrEP, you should first be tested for HIV. You should then be tested every 3 months for as long as you are taking PrEP.  PREGNANCY  If you are premenopausal and you may become pregnant, ask your health care provider about preconception counseling. If you may become pregnant, take 400 to 800 micrograms (mcg) of folic acid every day. If you want to prevent pregnancy, talk to your health care provider about birth control (contraception). OSTEOPOROSIS AND MENOPAUSE  Osteoporosis is a disease in which the bones lose minerals and strength with aging. This can result in serious bone fractures. Your risk for osteoporosis can be identified using a bone density scan. If you are 18 years of age or older, or if you are at risk for osteoporosis and fractures, ask your health care provider if you should be screened. Ask your health care provider whether you should take a calcium or vitamin D supplement to lower your risk for osteoporosis. Menopause may have certain physical symptoms and  risks. Hormone replacement therapy may reduce some of these symptoms and risks. Talk to your health care provider about whether hormone replacement therapy is right for you.  HOME CARE INSTRUCTIONS  Schedule regular health, dental, and eye exams. Stay current with your immunizations.  Do not use any tobacco products including cigarettes, chewing tobacco, or electronic cigarettes. If you are pregnant, do not drink alcohol. If you are breastfeeding, limit how much and how often you drink alcohol. Limit alcohol intake to no more than 1 drink per day for nonpregnant women. One drink equals 12 ounces of beer, 5 ounces of wine, or 1 ounces of hard liquor. Do not use street drugs. Do not share needles. Ask your health care provider for help if you need support or information about quitting drugs. Tell your health care provider if you often feel depressed. Tell your health care provider if you have ever been abused or do not feel safe at home. Document Released: 11/03/2010 Document Revised: 09/04/2013 Document Reviewed: 03/22/2013 [facility] Patient Information 2015 [place], [place]. This information is not intended to replace advice given to you by your health care provider. Make sure you discuss any questions you have with your health care provider.  [employer] Patient Information 2015 [place]. This information is not intended to replace advice given to you by your health care provider. Make sure you discuss any questions you have with your health care provider.

## 2014-12-13 NOTE — Assessment & Plan Note (Signed)
A1cs have been recurrently abnormal,  But fasting glucoses normal,  Discussed type 2 DM with her today

## 2014-12-15 NOTE — Assessment & Plan Note (Signed)

## 2014-12-17 LAB — CYTOLOGY - PAP

## 2014-12-18 ENCOUNTER — Encounter: Payer: Self-pay | Admitting: Internal Medicine

## 2014-12-18 NOTE — Addendum Note (Signed)
Addended by: [name] on: 12/18/2014 09:14 AM   Modules accepted: Miquel Dunn

## 2014-12-26 DIAGNOSIS — S83231A Complex tear of medial meniscus, current injury, right knee, initial encounter: Secondary | ICD-10-CM | POA: Diagnosis not present

## 2014-12-26 DIAGNOSIS — M79604 Pain in right leg: Secondary | ICD-10-CM | POA: Diagnosis not present

## 2014-12-26 DIAGNOSIS — S8011XD Contusion of right lower leg, subsequent encounter: Secondary | ICD-10-CM | POA: Diagnosis not present

## 2015-01-14 DIAGNOSIS — S8011XD Contusion of right lower leg, subsequent encounter: Secondary | ICD-10-CM | POA: Diagnosis not present

## 2015-01-14 DIAGNOSIS — S83231D Complex tear of medial meniscus, current injury, right knee, subsequent encounter: Secondary | ICD-10-CM | POA: Diagnosis not present

## 2015-01-18 DIAGNOSIS — K5000 Crohn's disease of small intestine without complications: Secondary | ICD-10-CM | POA: Diagnosis not present

## 2015-01-18 DIAGNOSIS — K5090 Crohn's disease, unspecified, without complications: Secondary | ICD-10-CM | POA: Diagnosis not present

## 2015-01-18 DIAGNOSIS — K5900 Constipation, unspecified: Secondary | ICD-10-CM | POA: Diagnosis not present

## 2015-01-28 ENCOUNTER — Inpatient Hospital Stay: Payer: Medicare Other | Attending: Oncology | Admitting: Oncology

## 2015-01-28 VITALS — BP 183/79 | HR 76 | Temp 98.3°F | Resp 18 | Wt 141.5 lb

## 2015-01-28 DIAGNOSIS — K5090 Crohn's disease, unspecified, without complications: Secondary | ICD-10-CM | POA: Diagnosis not present

## 2015-01-28 DIAGNOSIS — Z79811 Long term (current) use of aromatase inhibitors: Secondary | ICD-10-CM | POA: Diagnosis not present

## 2015-01-28 DIAGNOSIS — M129 Arthropathy, unspecified: Secondary | ICD-10-CM

## 2015-01-28 DIAGNOSIS — Z809 Family history of malignant neoplasm, unspecified: Secondary | ICD-10-CM | POA: Insufficient documentation

## 2015-01-28 DIAGNOSIS — Z170 Estrogen receptor positive status [ER+]: Secondary | ICD-10-CM | POA: Diagnosis not present

## 2015-01-28 DIAGNOSIS — I10 Essential (primary) hypertension: Secondary | ICD-10-CM | POA: Insufficient documentation

## 2015-01-28 DIAGNOSIS — Z79899 Other long term (current) drug therapy: Secondary | ICD-10-CM | POA: Insufficient documentation

## 2015-01-28 DIAGNOSIS — K219 Gastro-esophageal reflux disease without esophagitis: Secondary | ICD-10-CM | POA: Diagnosis not present

## 2015-01-28 DIAGNOSIS — C50911 Malignant neoplasm of unspecified site of right female breast: Secondary | ICD-10-CM | POA: Diagnosis not present

## 2015-01-28 NOTE — Progress Notes (Signed)
Patient here today for 6 month follow up regarding breast cancer. Patient on anastrozole and reports hot flashes, swelling to feet and ankles.

## 2015-02-09 NOTE — Progress Notes (Signed)
Kim Sanchez  [phone] [phone]  ID: Kim Sanchez OB: [birth year]  MR#: [id number]  IOM#:[id number]  Patient Care Team: Kim Mc, MD as PCP - General (Internal Medicine) Kim Bellow, MD (General Surgery) Kim Mc, MD (Internal Medicine)  CHIEF COMPLAINT:  Chief Complaint  Patient presents with  . Breast Cancer    follow up    INTERVAL HISTORY: Patient returns to clinic today for routine six-month evaluation. She continues to tolerate anastrozole without significant side effects.  Currently, she feels well and is asymptomatic.  She has no neurologic complaints.  She denies any fevers.  She has a good appetite and denies weight loss.  She denies any chest pain or shortness of breath.  She has no nausea, vomiting, constipation, or diarrhea.  She has no urinary complaints.  Patient offers no specific complaints today.   REVIEW OF SYSTEMS:   Review of Systems  Constitutional: Negative.   Respiratory: Negative.   Cardiovascular: Negative.   Gastrointestinal: Negative.   Musculoskeletal: Negative.   Neurological: Negative.     As per HPI. Otherwise, a complete review of systems is negatve.  PAST MEDICAL HISTORY: Past Medical History  Diagnosis Date  . Crohn's disease   . Arthritis   . GERD (gastroesophageal reflux disease)   . History of blood transfusion 1960  . Hypertension   . 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    PAST SURGICAL HISTORY: Past Surgical History  Procedure Laterality Date  . Dilation and curettage of uterus    . Brain surgery  1980    breast biopsies, benign  . Breast surgery  1980's    fibro cyst both breast in [place]  . Breast lumpectomy Right May 2014    T1c, N0; ER/PR positive, HER-2/neu not overexpressing.  . Colonoscopy       FAMILY HISTORY Family History  Problem Relation Age of Onset  . Cancer Paternal Grandmother        ADVANCED DIRECTIVES:    HEALTH MAINTENANCE: Social History  Substance Use Topics  . Smoking status: Never Smoker   . Smokeless tobacco: Never Used  . Alcohol Use: No     Colonoscopy:  PAP:  Bone density:  Lipid panel:  Allergies  Allergen Reactions  . Letrozole Anaphylaxis and Itching  . Exemestane Nausea Only  . Hydrochlorothiazide Other (See Comments)  . Lisinopril Cough  . Sulfa Antibiotics Other (See Comments)    Current Outpatient Prescriptions  Medication Sig Dispense Refill  . anastrozole (ARIMIDEX) 1 MG tablet Take 1 tablet by mouth daily.    . Biotin 10 MG CAPS Take by mouth.    . Calcium Carb-Cholecalciferol (CALCIUM + D3) 600-200 MG-UNIT TABS Take by mouth daily.     . Cholecalciferol (VITAMIN D3) 2000 UNITS TABS Take 1 tablet by mouth daily.    . Fiber CHEW Chew by mouth as needed.    . fish oil-omega-3 fatty acids 1000 MG capsule Take 2 g by mouth daily.    Marland Kitchen ibuprofen (ADVIL,MOTRIN) 200 MG tablet Take 200 mg by mouth every 6 (six) hours as needed.    . inFLIXimab (REMICADE) 100 MG injection Inject 1 mg into the vein every 8 (eight) weeks.    Marland Kitchen LORazepam (ATIVAN) 0.5 MG tablet Take 1 tablet (0.5 mg total) by mouth 2 (two) times daily. 60 tablet 5  .  Multiple Vitamin (MULTIVITAMIN) tablet Take 1 tablet by mouth daily.    Marland Kitchen nystatin (MYCOSTATIN) 100000 UNIT/ML suspension Take 5 mLs (500,000 Units total) by mouth 4 (four) times daily. 120 mL 0  . polyethylene glycol (MIRALAX / GLYCOLAX) packet Take 17 g by mouth daily as needed.     . Red Yeast Rice 600 MG CAPS Take by mouth daily.     . Tdap (BOOSTRIX) 5-2.5-18.5 LF-MCG/0.5 injection Inject 0.5 mLs into the muscle once. 0.5 mL 0  . amLODipine (NORVASC) 2.5 MG tablet TAKE 1 TABLET BY MOUTH TWICE DAILY (Patient not taking: Reported on 12/13/2014) 60 tablet 5   No current facility-administered medications  for this visit.    OBJECTIVE: Filed Vitals:   01/28/15 1508  BP: 183/79  Pulse: 76  Temp: 98.3 F (36.8 C)  Resp: 18     Body mass index is 25.88 kg/(m^2).    ECOG FS:0 - Asymptomatic  General: Well-developed, well-nourished, no acute distress. Eyes: Pink conjunctiva, anicteric sclera. Breasts: Bilateral breast ans axilla without lumps or masses. Lungs: Clear to auscultation bilaterally. Heart: Regular rate and rhythm. No rubs, murmurs, or gallops. Abdomen: Soft, nontender, nondistended. No organomegaly noted, normoactive bowel sounds. Musculoskeletal: No edema, cyanosis, or clubbing. Neuro: Alert, answering all questions appropriately. Cranial nerves grossly intact. Skin: No rashes or petechiae noted. Psych: Normal affect.   LAB RESULTS:  Lab Results  Component Value Date   NA 138 08/30/2014   K 3.7 08/30/2014   CL 101 08/30/2014   CO2 28 08/30/2014   GLUCOSE 90 08/30/2014   BUN 10 08/30/2014   CREATININE 0.67 08/30/2014   CALCIUM 9.7 08/30/2014   PROT 7.6 08/30/2014   ALBUMIN 4.5 08/30/2014   AST 18 08/30/2014   ALT 16 08/30/2014   ALKPHOS 89 08/30/2014   BILITOT 0.6 08/30/2014   GFRNONAA >60 08/30/2012   GFRAA >60 08/30/2012    Lab Results  Component Value Date   WBC 7.1 08/30/2014   NEUTROABS 4.5 08/30/2014   HGB 13.4 08/30/2014   HCT 39.1 08/30/2014   MCV 88.5 08/30/2014   PLT 245.0 08/30/2014     STUDIES: No results found.  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  PLAN:    1.  Breast cancer: Patient could not tolerate Aromasin or letrozole.  Continue with anastrozole, completing in September of 2019.  Patient reports her most recent mammogram in December 2015 was reported as normal. Repeat in December 2016. Bone mineral density on January 23, 2014 with a T score of -1.0 with his which is essentially unchanged. Repeat in 2 years. Return to clinic in 6 months for routine  followup.   2. Arthritis: Continue Remicade as prescribed by her rheumatologist. 3. Elevated blood pressure: Monitor, continue treatment per PCP.  Patient expressed understanding and was in agreement with this plan. She also understands that She can call clinic at any time with any questions, concerns, or complaints.    Lloyd Huger, MD   02/09/2015 12:20 PM

## 2015-02-22 DIAGNOSIS — J069 Acute upper respiratory infection, unspecified: Secondary | ICD-10-CM | POA: Diagnosis not present

## 2015-02-26 ENCOUNTER — Other Ambulatory Visit: Payer: Self-pay | Admitting: Oncology

## 2015-02-26 ENCOUNTER — Other Ambulatory Visit: Payer: Medicare Other

## 2015-03-06 ENCOUNTER — Ambulatory Visit: Payer: Medicare Other | Admitting: Internal Medicine

## 2015-03-14 ENCOUNTER — Other Ambulatory Visit: Payer: Self-pay | Admitting: Internal Medicine

## 2015-03-14 ENCOUNTER — Other Ambulatory Visit: Payer: Medicare Other

## 2015-03-14 DIAGNOSIS — K5090 Crohn's disease, unspecified, without complications: Secondary | ICD-10-CM | POA: Diagnosis not present

## 2015-03-15 ENCOUNTER — Telehealth: Payer: Self-pay | Admitting: *Deleted

## 2015-03-15 ENCOUNTER — Other Ambulatory Visit (INDEPENDENT_AMBULATORY_CARE_PROVIDER_SITE_OTHER): Payer: Medicare Other

## 2015-03-15 DIAGNOSIS — R739 Hyperglycemia, unspecified: Secondary | ICD-10-CM

## 2015-03-15 DIAGNOSIS — E785 Hyperlipidemia, unspecified: Secondary | ICD-10-CM

## 2015-03-15 DIAGNOSIS — I10 Essential (primary) hypertension: Secondary | ICD-10-CM

## 2015-03-15 DIAGNOSIS — S8011XD Contusion of right lower leg, subsequent encounter: Secondary | ICD-10-CM | POA: Diagnosis not present

## 2015-03-15 LAB — COMPREHENSIVE METABOLIC PANEL
ALT: 11 U/L (ref 0–35)
AST: 15 U/L (ref 0–37)
Albumin: 4.1 g/dL (ref 3.5–5.2)
Alkaline Phosphatase: 86 U/L (ref 39–117)
BILIRUBIN TOTAL: 0.8 mg/dL (ref 0.2–1.2)
BUN: 11 mg/dL (ref 6–23)
CO2: 30 meq/L (ref 19–32)
CREATININE: 0.67 mg/dL (ref 0.40–1.20)
Calcium: 9.5 mg/dL (ref 8.4–10.5)
Chloride: 104 mEq/L (ref 96–112)
GFR: 91.47 mL/min (ref >60.00)
GLUCOSE: 100 mg/dL — AB (ref 70–99)
Potassium: 4.1 mEq/L (ref 3.5–5.1)
Sodium: 142 mEq/L (ref 135–145)
Total Protein: 7.1 g/dL (ref 6.0–8.3)

## 2015-03-15 LAB — HEMOGLOBIN A1C: HEMOGLOBIN A1C: 6.4 % (ref 4.6–6.5)

## 2015-03-15 NOTE — Telephone Encounter (Signed)
Labs and dx?  

## 2015-03-15 NOTE — Telephone Encounter (Signed)
Ok to refill,  Refill PRINTED  BUT NOT FOR 90 DAYS BC IT IS A CONTROLLED SUBSTANCE

## 2015-03-17 ENCOUNTER — Encounter: Payer: Self-pay | Admitting: Internal Medicine

## 2015-03-20 ENCOUNTER — Encounter: Payer: Self-pay | Admitting: Internal Medicine

## 2015-03-20 ENCOUNTER — Ambulatory Visit (INDEPENDENT_AMBULATORY_CARE_PROVIDER_SITE_OTHER): Payer: Medicare Other | Admitting: Internal Medicine

## 2015-03-20 VITALS — BP 140/78 | HR 70 | Temp 98.3°F | Wt 134.2 lb

## 2015-03-20 DIAGNOSIS — R7301 Impaired fasting glucose: Secondary | ICD-10-CM

## 2015-03-20 DIAGNOSIS — F5105 Insomnia due to other mental disorder: Secondary | ICD-10-CM | POA: Diagnosis not present

## 2015-03-20 DIAGNOSIS — I10 Essential (primary) hypertension: Secondary | ICD-10-CM | POA: Diagnosis not present

## 2015-03-20 DIAGNOSIS — Z23 Encounter for immunization: Secondary | ICD-10-CM | POA: Diagnosis not present

## 2015-03-20 DIAGNOSIS — E785 Hyperlipidemia, unspecified: Secondary | ICD-10-CM

## 2015-03-20 DIAGNOSIS — F409 Phobic anxiety disorder, unspecified: Secondary | ICD-10-CM

## 2015-03-20 NOTE — Progress Notes (Signed)
Pre visit review using our clinic review tool, if applicable. No additional management support is needed unless otherwise documented below in the visit note. 

## 2015-03-20 NOTE — Patient Instructions (Addendum)
You are doing well.  I will see you in 6 months   To prevent recurrent sinus infections:    flush your sinuses  Once  daily with Milta Deiters Med's sinus rinse.  this is a medicated water flush that you should do over the sink because if you do it right you will have lots of water and mucus to spit out

## 2015-03-22 NOTE — Assessment & Plan Note (Signed)
Her A1c is elevated , suggesting she has diet controlled DM.  She is not overweight and has a careful diet.  Will repeat in 6 months.  She has a documented intolerance to lisinopril and no proteinuria by Dec 2015 assessment.    Lab Results  Component Value Date   HGBA1C 6.4 03/15/2015   Lab Results  Component Value Date   MICROALBUR 0.4 04/11/2014

## 2015-03-22 NOTE — Assessment & Plan Note (Signed)
Improved with use of lorazepam

## 2015-03-22 NOTE — Assessment & Plan Note (Signed)
Managed with red yeast rice.  She is not fasting today. lfts were normal in April she will return for fasting lipids.  Lab Results  Component Value Date   CHOL 255* 08/30/2014   HDL 79.20 08/30/2014   LDLCALC 157* 08/30/2014   LDLDIRECT 151.5 10/03/2013   TRIG 96.0 08/30/2014   CHOLHDL 3 08/30/2014   Lab Results  Component Value Date   ALT 11 03/15/2015   AST 15 03/15/2015   ALKPHOS 86 03/15/2015   BILITOT 0.8 03/15/2015

## 2015-03-22 NOTE — Progress Notes (Signed)
Subjective:  Patient ID: Kim Sanchez, female    DOB: 09-May-1940  Age: 74 y.o. MRN: [id number]  CC: The primary encounter diagnosis was Need for prophylactic vaccination and inoculation against influenza. Diagnoses of Hyperlipidemia, Essential hypertension, Insomnia due to anxiety and fear, and Impaired fasting blood sugar were also pertinent to this visit.  HPI [name] presents for follow up on hypertension ,  Hyperlipidemia.and anxiety.   She feels generally well and has no complaints.  She has been taking lorazepam twice daily for anxiety and her amlodipine has been stopped by Dr Ubaldo Glassing. Her appetite is good and her weight has been stable.  Her Crohn's Disease has been quiescent on remicaide and she had surveillance colonoscopy last week.  .  She is walking daily and is tolerating anastrozole for management of BRCA diagnosed in 2014.  Outpatient Prescriptions Prior to Visit  Medication Sig Dispense Refill  . anastrozole (ARIMIDEX) 1 MG tablet TAKE 1 TABLET BY MOUTH ONCE DAILY 30 tablet 0  . Biotin 10 MG CAPS Take by mouth.    . Calcium Carb-Cholecalciferol (CALCIUM + D3) 600-200 MG-UNIT TABS Take by mouth daily.     . Cholecalciferol (VITAMIN D3) 2000 UNITS TABS Take 1 tablet by mouth daily.    . Fiber CHEW Chew by mouth as needed.    . fish oil-omega-3 fatty acids 1000 MG capsule Take 2 g by mouth daily.    Marland Kitchen ibuprofen (ADVIL,MOTRIN) 200 MG tablet Take 200 mg by mouth every 6 (six) hours as needed.    . inFLIXimab (REMICADE) 100 MG injection Inject 1 mg into the vein every 8 (eight) weeks.    Marland Kitchen LORazepam (ATIVAN) 0.5 MG tablet Take 1 tablet (0.5 mg total) by mouth 2 (two) times daily. As needed for anxiety 60 tablet 5  . Multiple Vitamin (MULTIVITAMIN) tablet Take 1 tablet by mouth daily.    Marland Kitchen nystatin (MYCOSTATIN) 100000 UNIT/ML suspension Take 5 mLs (500,000 Units total) by mouth 4 (four) times daily. 120 mL 0  . polyethylene glycol (MIRALAX / GLYCOLAX) packet Take 17 g by mouth  daily as needed.     . Red Yeast Rice 600 MG CAPS Take by mouth daily.     . Tdap (BOOSTRIX) 5-2.5-18.5 LF-MCG/0.5 injection Inject 0.5 mLs into the muscle once. 0.5 mL 0  . amLODipine (NORVASC) 2.5 MG tablet TAKE 1 TABLET BY MOUTH TWICE DAILY (Patient not taking: Reported on 12/13/2014) 60 tablet 5   No facility-administered medications prior to visit.    Review of Systems;  Patient denies headache, fevers, malaise, unintentional weight loss, skin rash, eye pain, sinus congestion and sinus pain, sore throat, dysphagia,  hemoptysis , cough, dyspnea, wheezing, chest pain, palpitations, orthopnea, edema, abdominal pain, nausea, melena, diarrhea, constipation, flank pain, dysuria, hematuria, urinary  Frequency, nocturia, numbness, tingling, seizures,  Focal weakness, Loss of consciousness,  Tremor, insomnia, depression, anxiety, and suicidal ideation.      Objective:  BP 140/78 mmHg  Pulse 70  Temp(Src) 98.3 F (36.8 C) (Oral)  Wt 134 lb 3.2 oz (60.873 kg)  SpO2 97%  BP Readings from Last 3 Encounters:  03/20/15 140/78  01/28/15 183/79  12/13/14 158/84    Wt Readings from Last 3 Encounters:  03/20/15 134 lb 3.2 oz (60.873 kg)  01/28/15 141 lb 8.6 oz (64.2 kg)  12/13/14 135 lb 12 oz (61.576 kg)    General appearance: alert, cooperative and appears stated age Ears: normal TM's and external ear canals both ears Throat: lips,  mucosa, and tongue normal; teeth and gums normal Neck: no adenopathy, no carotid bruit, supple, symmetrical, trachea midline and thyroid not enlarged, symmetric, no tenderness/mass/nodules Back: symmetric, no curvature. ROM normal. No CVA tenderness. Lungs: clear to auscultation bilaterally Heart: regular rate and rhythm, S1, S2 normal, no murmur, click, rub or gallop Abdomen: soft, non-tender; bowel sounds normal; no masses,  no organomegaly Pulses: 2+ and symmetric Skin: Skin color, texture, turgor normal. No rashes or lesions Lymph nodes: Cervical,  supraclavicular, and axillary nodes normal.  Lab Results  Component Value Date   HGBA1C 6.4 03/15/2015   HGBA1C 6.1 08/30/2014   HGBA1C 6.6* 04/11/2014    Lab Results  Component Value Date   CREATININE 0.67 03/15/2015   CREATININE 0.67 08/30/2014   CREATININE 0.6 04/11/2014    Lab Results  Component Value Date   WBC 7.1 08/30/2014   HGB 13.4 08/30/2014   HCT 39.1 08/30/2014   PLT 245.0 08/30/2014   GLUCOSE 100* 03/15/2015   CHOL 255* 08/30/2014   TRIG 96.0 08/30/2014   HDL 79.20 08/30/2014   LDLDIRECT 151.5 10/03/2013   LDLCALC 157* 08/30/2014   ALT 11 03/15/2015   AST 15 03/15/2015   NA 142 03/15/2015   K 4.1 03/15/2015   CL 104 03/15/2015   CREATININE 0.67 03/15/2015   BUN 11 03/15/2015   CO2 30 03/15/2015   TSH 2.08 08/30/2014   HGBA1C 6.4 03/15/2015   MICROALBUR 0.4 04/11/2014    No results found.  Assessment & Plan:   Problem List Items Addressed This Visit    Hyperlipidemia    Managed with red yeast rice.  She is not fasting today. lfts were normal in April she will return for fasting lipids.  Lab Results  Component Value Date   CHOL 255* 08/30/2014   HDL 79.20 08/30/2014   LDLCALC 157* 08/30/2014   LDLDIRECT 151.5 10/03/2013   TRIG 96.0 08/30/2014   CHOLHDL 3 08/30/2014   Lab Results  Component Value Date   ALT 11 03/15/2015   AST 15 03/15/2015   ALKPHOS 86 03/15/2015   BILITOT 0.8 03/15/2015           Hypertension    Managed currentky with anxiolytics, given multiple intolerances to medications.       Insomnia due to anxiety and fear    Improved with use of lorazepam      Impaired fasting blood sugar    Her A1c is elevated , suggesting she has diet controlled DM.  She is not overweight and has a careful diet.  Will repeat in 6 months.  She has a documented intolerance to lisinopril and no proteinuria by Dec 2015 assessment.    Lab Results  Component Value Date   HGBA1C 6.4 03/15/2015   Lab Results  Component Value Date    MICROALBUR 0.4 04/11/2014            Other Visit Diagnoses    Need for prophylactic vaccination and inoculation against influenza    -  Primary    Relevant Orders    Flu Vaccine QUAD 36+ mos IM (Completed)      A total of 25 minutes of face to face time was spent with patient more than half of which was spent in counselling about the above mentioned conditions  and coordination of care   I am having Ms. Allender maintain her polyethylene glycol, ibuprofen, Calcium + D3, multivitamin, Biotin, Red Yeast Rice, fish oil-omega-3 fatty acids, Fiber, inFLIXimab, Vitamin D3, nystatin, amLODipine, Tdap, anastrozole,  and LORazepam.  No orders of the defined types were placed in this encounter.    There are no discontinued medications.  Follow-up: No Follow-up on file.   [name], MD

## 2015-03-22 NOTE — Assessment & Plan Note (Signed)
Managed currentky with anxiolytics, given multiple intolerances to medications.

## 2015-04-01 ENCOUNTER — Other Ambulatory Visit: Payer: Self-pay | Admitting: *Deleted

## 2015-04-01 MED ORDER — ANASTROZOLE 1 MG PO TABS: 1.0000 mg | ORAL_TABLET | Freq: Every day | ORAL | Status: DC

## 2015-04-03 DIAGNOSIS — H2513 Age-related nuclear cataract, bilateral: Secondary | ICD-10-CM | POA: Diagnosis not present

## 2015-04-03 DIAGNOSIS — H43811 Vitreous degeneration, right eye: Secondary | ICD-10-CM | POA: Diagnosis not present

## 2015-04-04 ENCOUNTER — Ambulatory Visit: Payer: Medicare Other | Admitting: Internal Medicine

## 2015-04-05 NOTE — Telephone Encounter (Signed)
Mailed unread message to patient.  

## 2015-04-11 ENCOUNTER — Encounter: Payer: Self-pay | Admitting: General Surgery

## 2015-04-11 ENCOUNTER — Other Ambulatory Visit: Payer: PRIVATE HEALTH INSURANCE

## 2015-04-11 ENCOUNTER — Ambulatory Visit (INDEPENDENT_AMBULATORY_CARE_PROVIDER_SITE_OTHER): Payer: Medicare Other | Admitting: General Surgery

## 2015-04-11 VITALS — BP 138/78 | HR 74 | Resp 14 | Ht 62.0 in | Wt 134.0 lb

## 2015-04-11 DIAGNOSIS — N6310 Unspecified lump in the right breast, unspecified quadrant: Secondary | ICD-10-CM

## 2015-04-11 DIAGNOSIS — N63 Unspecified lump in breast: Secondary | ICD-10-CM | POA: Diagnosis not present

## 2015-04-11 DIAGNOSIS — R319 Hematuria, unspecified: Secondary | ICD-10-CM | POA: Diagnosis not present

## 2015-04-11 DIAGNOSIS — C50911 Malignant neoplasm of unspecified site of right female breast: Secondary | ICD-10-CM | POA: Diagnosis not present

## 2015-04-11 NOTE — Progress Notes (Signed)
Patient ID: Kim Sanchez, female   DOB: Nov 18, 1940, 74 y.o.   MRN: [id number]  Chief Complaint  Patient presents with  . Breast Problem    HPI Kim Sanchez is a 74 y.o. female here today for a breast check. Patient states she felt a hard spot near her right breast lumpectomy site. She noticed it about two weeks ago. No pain. Patient husband had a stroke in October 2016 and she states she has been doing a lot of heavy lifting since that time to assist him. She is not aware of any direct trauma to the area.  I personally reviewed the patient's history. HPI  Past Medical History  Diagnosis Date  . Crohn's disease ([place])   . Arthritis   . GERD (gastroesophageal reflux disease)   . History of blood transfusion 1960  . Hypertension   . Breast cancer, right breast ([place]) 08/24/2012    Histologic grade 1, invasive mammary carcinoma, no specific type. 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    Past Surgical History  Procedure Laterality Date  . Dilation and curettage of uterus    . Brain surgery  1980    breast biopsies, benign  . Breast surgery  1980's    fibro cyst both breast in [place]  . Breast lumpectomy Right May 2014    T1c, N0; ER/PR positive, HER-2/neu not overexpressing.  . Colonoscopy      Family History  Problem Relation Age of Onset  . Cancer Paternal Grandmother     Social History Social History  Substance Use Topics  . Smoking status: Never Smoker   . Smokeless tobacco: Never Used  . Alcohol Use: No    Allergies  Allergen Reactions  . Letrozole Anaphylaxis and Itching  . Exemestane Nausea Only  . Hydrochlorothiazide Other (See Comments)  . Lisinopril Cough  . Sulfa Antibiotics Other (See Comments)    Current Outpatient Prescriptions  Medication Sig Dispense Refill  . anastrozole (ARIMIDEX) 1 MG tablet Take 1 tablet (1 mg total) by mouth daily. 30 tablet 5  . Biotin 10 MG CAPS Take by mouth.    .  Calcium Carb-Cholecalciferol (CALCIUM + D3) 600-200 MG-UNIT TABS Take by mouth daily.     . Cholecalciferol (VITAMIN D3) 2000 UNITS TABS Take 1 tablet by mouth daily.    . Fiber CHEW Chew by mouth as needed.    . fish oil-omega-3 fatty acids 1000 MG capsule Take 2 g by mouth as needed.     Marland Kitchen ibuprofen (ADVIL,MOTRIN) 200 MG tablet Take 200 mg by mouth every 6 (six) hours as needed.    . inFLIXimab (REMICADE) 100 MG injection Inject 1 mg into the vein every 8 (eight) weeks.    Marland Kitchen LORazepam (ATIVAN) 0.5 MG tablet Take 1 tablet (0.5 mg total) by mouth 2 (two) times daily. As needed for anxiety 60 tablet 5  . Multiple Vitamin (MULTIVITAMIN) tablet Take 1 tablet by mouth daily.    Marland Kitchen nystatin (MYCOSTATIN) 100000 UNIT/ML suspension Take 5 mLs (500,000 Units total) by mouth 4 (four) times daily. 120 mL 0  . polyethylene glycol (MIRALAX / GLYCOLAX) packet Take 17 g by mouth daily as needed.     . Red Yeast Rice 600 MG CAPS Take by mouth as needed.     . Tdap (BOOSTRIX) 5-2.5-18.5 LF-MCG/0.5 injection Inject 0.5 mLs into the muscle once. 0.5 mL 0   No current facility-administered medications for this visit.    Review  of Systems Review of Systems  Blood pressure 138/78, pulse 74, resp. rate 14, height 5' 2"  (1.575 m), weight 134 lb (60.782 kg).  Physical Exam Physical Exam  Constitutional: She is oriented to person, place, and time. She appears well-developed and well-nourished.  Eyes: Conjunctivae are normal. No scleral icterus.  Neck: Neck supple.  Cardiovascular: Normal rate and regular rhythm.   Murmur heard.  Systolic murmur is present with a grade of 2/6  Pulmonary/Chest: Effort normal and breath sounds normal. Right breast exhibits mass. Right breast exhibits no inverted nipple, no nipple discharge, no skin change and no tenderness. Left breast exhibits no inverted nipple, no mass, no nipple discharge, no skin change and no tenderness.    Well healed incision from 10 to 1 . 6 mm nodular  thickening   Lymphadenopathy:    She has no cervical adenopathy.  Neurological: She is alert and oriented to person, place, and time.  Skin: Skin is warm and dry.    Data Reviewed Review of the October 2016 medical oncology record showed no reported abnormality of the breast.  Ultrasound examination of the area of palpable thickening in the right breast showed a nodular area just below the dermis with a central area suggesting calcification measuring up to 0.3 cm with the overall dimensions measuring 0.76 x 0.77 x 0.92 cm. The areas consistent with fat necrosis. BI-RADS-3.   The patient was amenable to FNA sampling. 1 mL of 0.5% Xylocaine with 0.25% Marcaine with 1-200,000 epinephrine was utilized well tolerated. Multiple passes through the lesion were obtained and slides 2 prepared for cytology.    Assessment    Focal nodule right breast, likely fat necrosis.    Plan    The patient will be contacted when cytology results are available.    PCP:  Wende Mott 04/12/2015, 7:04 AM

## 2015-04-11 NOTE — Patient Instructions (Signed)
The patient is aware to call back for any questions or concerns.  

## 2015-04-12 ENCOUNTER — Telehealth: Payer: Self-pay | Admitting: General Surgery

## 2015-04-12 DIAGNOSIS — N6310 Unspecified lump in the right breast, unspecified quadrant: Secondary | ICD-10-CM | POA: Insufficient documentation

## 2015-04-12 NOTE — Telephone Encounter (Signed)
Patient notified cytology was fine.  Will follow up as previously scheduled unless the area enlarges or becomes symptomatic.

## 2015-04-19 IMAGING — MG MM DIGITAL DIAGNOSTIC BILAT W/ CAD
1 series · 5 of 5 positions shown · non-contrast
Comparison: 01/14/2012, 12/31/2010, 09/18/2009

CLINICAL DATA: Right lumpectomy 0129

EXAM:
DIGITAL DIAGNOSTIC  BILATERAL MAMMOGRAM WITH CAD

[R CC · right · 5 of 5 slices shown]
[im 1/5]
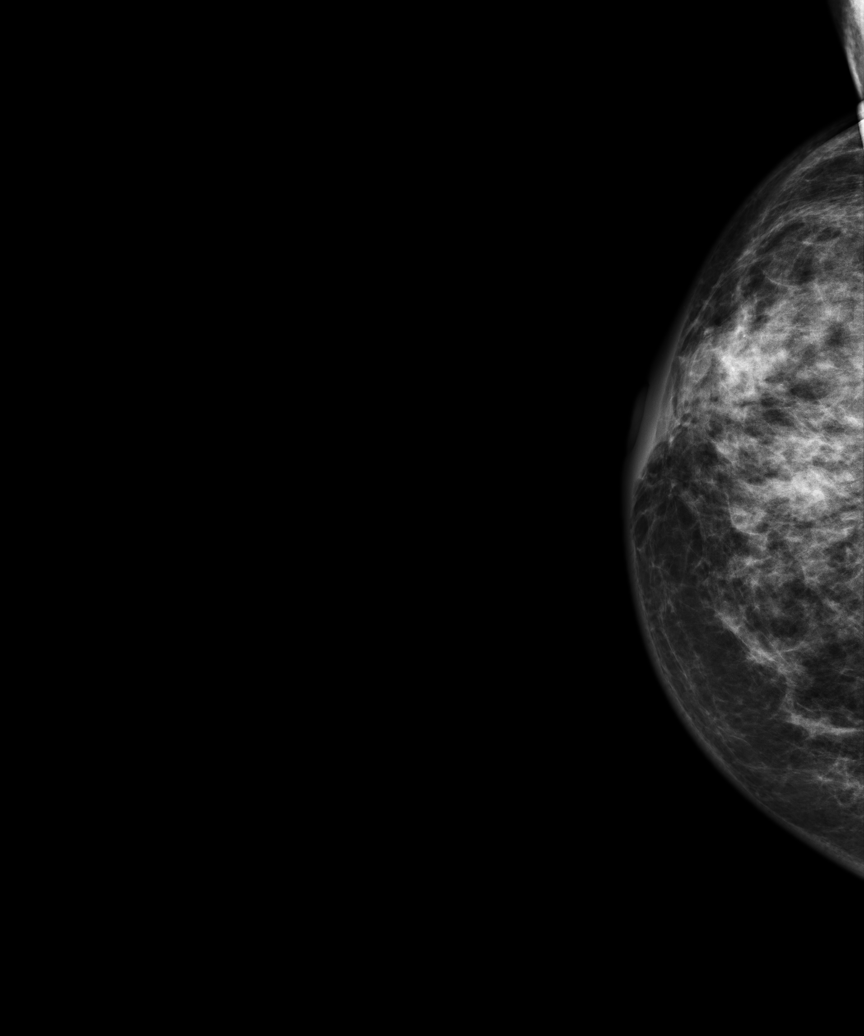
[im 2/5]
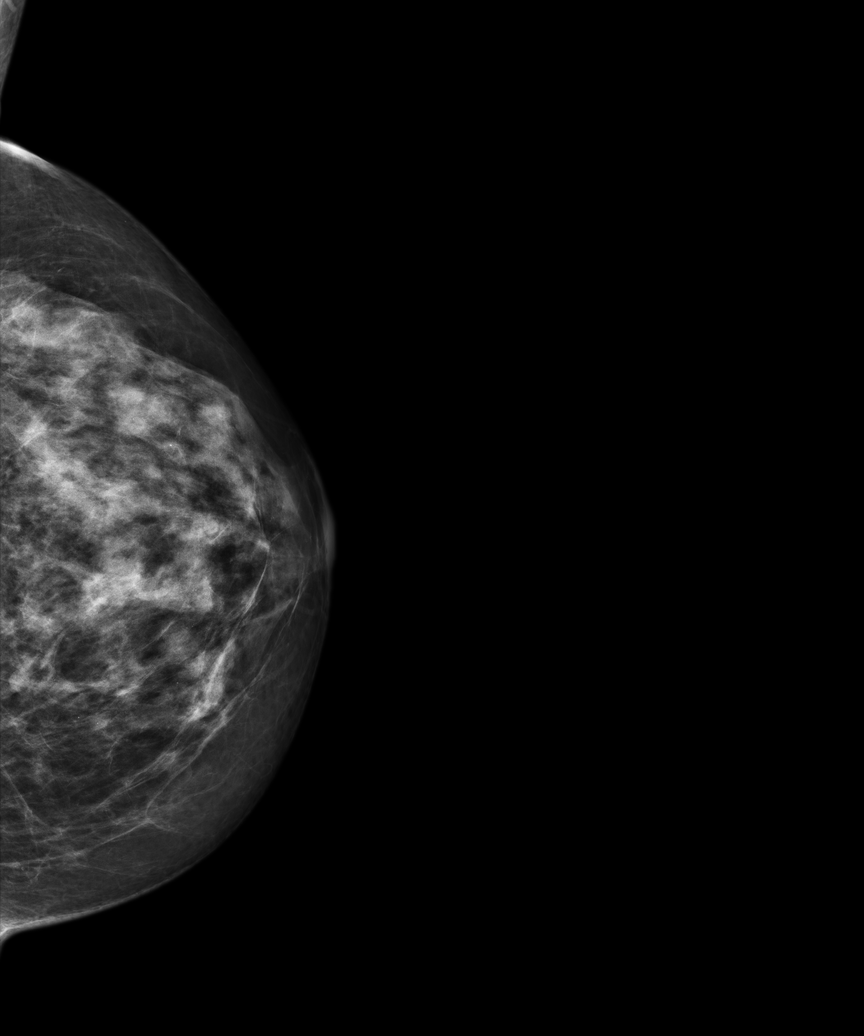
[im 3/5]
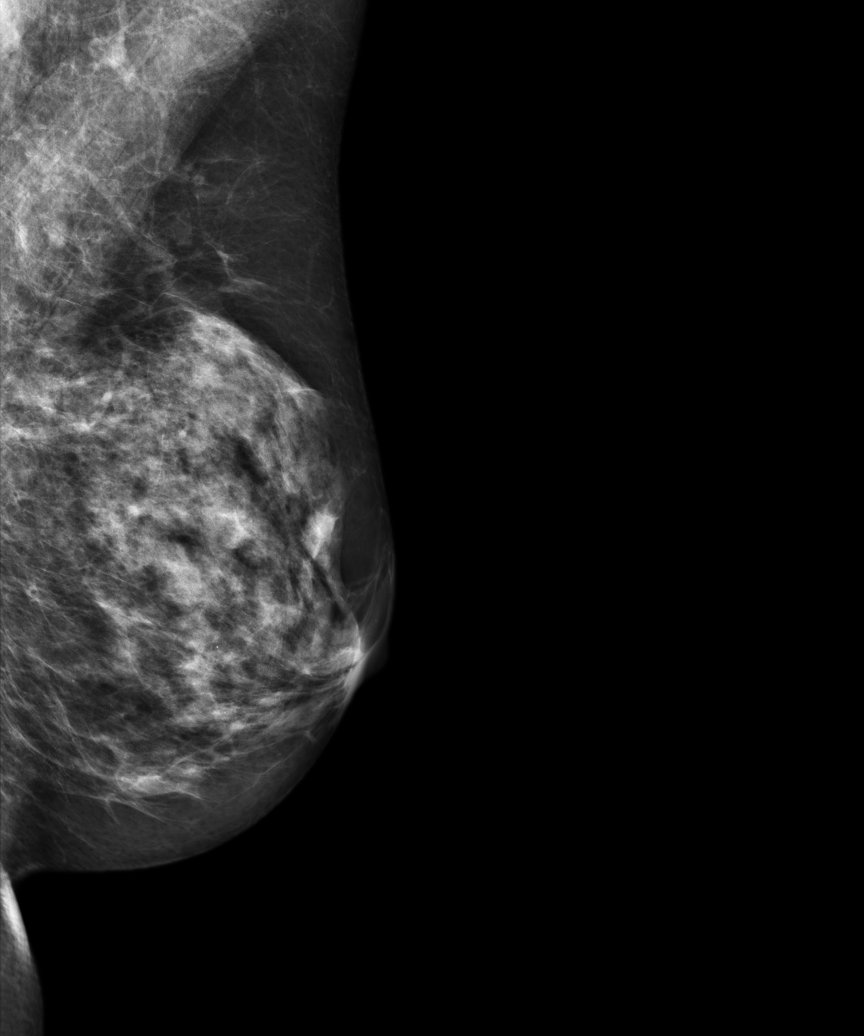
[im 4/5]
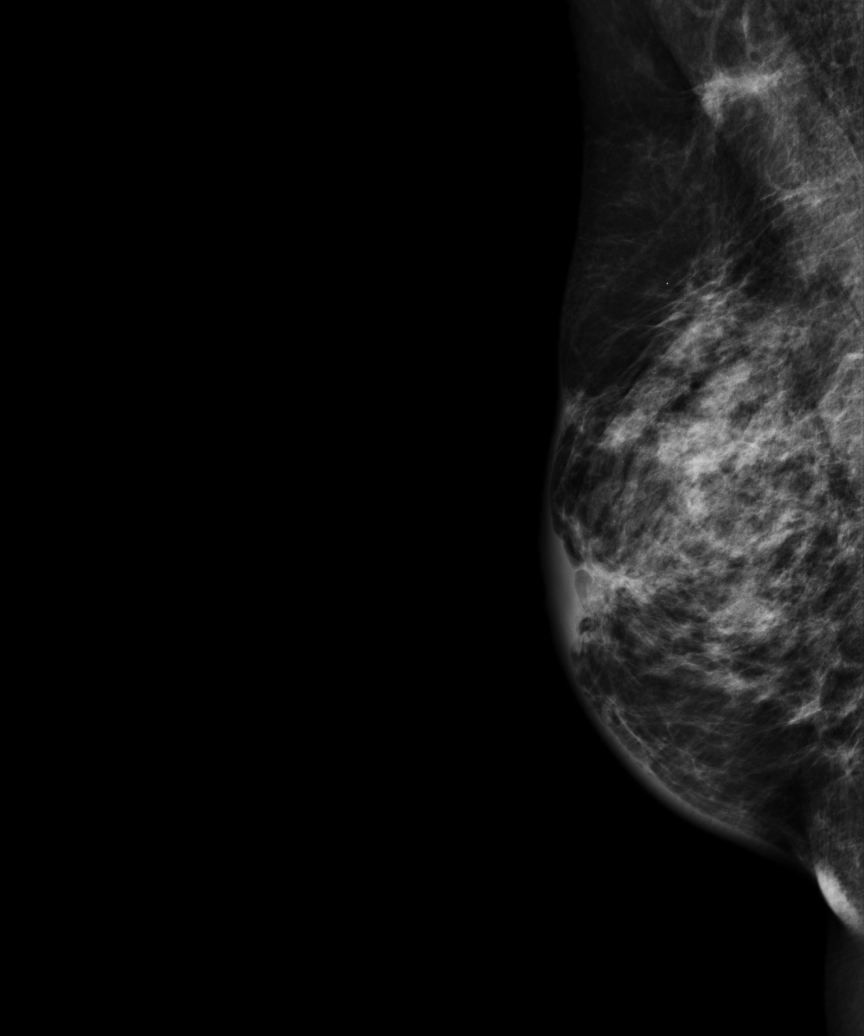
[im 5/5]
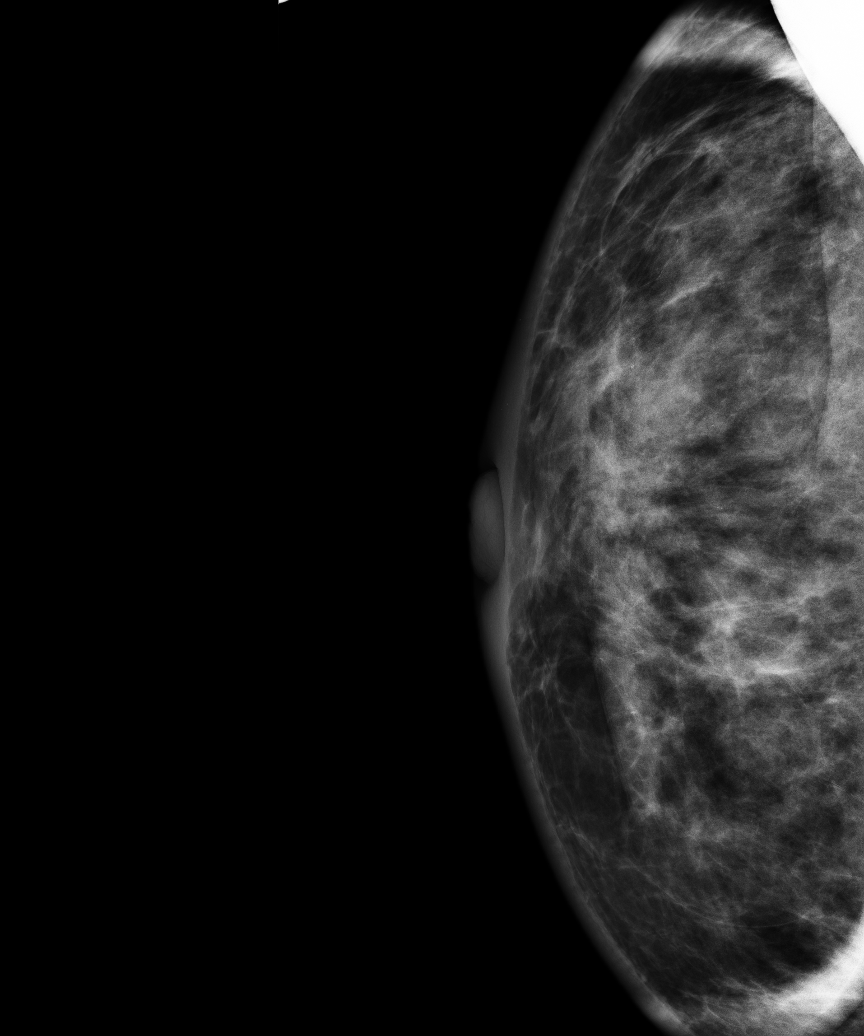

[5 of 5 positions shown; findings below may reference images not displayed]

ACR Breast Density Category c: The breast tissue is heterogeneously
dense, which may obscure small masses.
FINDINGS: Subareolar right postsurgical scar. No suspicious mass or
calcifications on either side. Bilateral axillary glandular tissue
stable from prior studies. Scattered bilateral round calcifications
stable and benign.

Mammographic images were processed with CAD.
IMPRESSION: Benign postoperative appearance

RECOMMENDATION:
Diagnostic bilateral mammogram in 1 year

I have discussed the findings and recommendations with the patient.
Results were also provided in writing at the conclusion of the
visit. If applicable, a reminder letter will be sent to the patient
regarding the next appointment.

BI-RADS CATEGORY  2: Benign Finding(s)

## 2015-04-26 DIAGNOSIS — X32XXXA Exposure to sunlight, initial encounter: Secondary | ICD-10-CM | POA: Diagnosis not present

## 2015-04-26 DIAGNOSIS — D1801 Hemangioma of skin and subcutaneous tissue: Secondary | ICD-10-CM | POA: Diagnosis not present

## 2015-04-26 DIAGNOSIS — L658 Other specified nonscarring hair loss: Secondary | ICD-10-CM | POA: Diagnosis not present

## 2015-04-26 DIAGNOSIS — L821 Other seborrheic keratosis: Secondary | ICD-10-CM | POA: Diagnosis not present

## 2015-05-01 ENCOUNTER — Encounter: Payer: Self-pay | Admitting: Internal Medicine

## 2015-05-05 HISTORY — PX: COLONOSCOPY: SHX174

## 2015-05-08 ENCOUNTER — Telehealth: Payer: Self-pay | Admitting: Internal Medicine

## 2015-05-08 NOTE — Telephone Encounter (Signed)
Patient Name: Kim Sanchez  DOB: [birth year]    Initial Comment Caller states her bp is 172/95,    Nurse Assessment  Nurse: Raphael Gibney, RN, Vera Date/Time (Eastern Time): 05/08/2015 12:47:03 PM  Confirm and document reason for call. If symptomatic, describe symptoms. ---Caller states her BP is 172/95. Had a little headache and took a lorazepam which helped. She took BP medication last year but is not taking it now.  Has the patient traveled out of the country within the last 30 days? ---No  Does the patient have any new or worsening symptoms? ---Yes  Will a triage be completed? ---Yes  Related visit to physician within the last 2 weeks? ---No  Does the PT have any chronic conditions? (i.e. diabetes, asthma, etc.) ---Yes  List chronic conditions. ---HTN; heart murmur  Is this a behavioral health or substance abuse call? ---No     Guidelines    Guideline Title Affirmed Question Affirmed Notes  High Blood Pressure [1] BP ? 140/90 AND [2] not taking BP medications    Final Disposition User   See PCP within 2 Maudry Diego, RN, Vanita Ingles    Comments  Appt scheduled for 05/09/2015 at 8:30 am with Dr. Deborra Medina   Referrals  REFERRED TO PCP OFFICE   Disagree/Comply: Comply

## 2015-05-09 ENCOUNTER — Other Ambulatory Visit: Payer: Self-pay | Admitting: Internal Medicine

## 2015-05-09 ENCOUNTER — Encounter: Payer: Self-pay | Admitting: Internal Medicine

## 2015-05-09 ENCOUNTER — Ambulatory Visit (INDEPENDENT_AMBULATORY_CARE_PROVIDER_SITE_OTHER): Payer: Medicare Other | Admitting: Internal Medicine

## 2015-05-09 VITALS — BP 164/84 | HR 75 | Temp 98.3°F | Resp 12 | Ht 62.0 in | Wt 132.4 lb

## 2015-05-09 DIAGNOSIS — I10 Essential (primary) hypertension: Secondary | ICD-10-CM | POA: Diagnosis not present

## 2015-05-09 DIAGNOSIS — F5105 Insomnia due to other mental disorder: Secondary | ICD-10-CM | POA: Diagnosis not present

## 2015-05-09 DIAGNOSIS — R609 Edema, unspecified: Secondary | ICD-10-CM | POA: Diagnosis not present

## 2015-05-09 DIAGNOSIS — R7301 Impaired fasting glucose: Secondary | ICD-10-CM

## 2015-05-09 DIAGNOSIS — F409 Phobic anxiety disorder, unspecified: Secondary | ICD-10-CM

## 2015-05-09 MED ORDER — AMLODIPINE BESYLATE 2.5 MG PO TABS: 2.5000 mg | ORAL_TABLET | Freq: Every day | ORAL | Status: DC

## 2015-05-09 MED ORDER — FUROSEMIDE 20 MG PO TABS: 20.0000 mg | ORAL_TABLET | Freq: Every day | ORAL | Status: DC

## 2015-05-09 MED ORDER — LORAZEPAM 0.5 MG PO TABS: 0.5000 mg | ORAL_TABLET | Freq: Two times a day (BID) | ORAL | Status: DC

## 2015-05-09 NOTE — Progress Notes (Signed)
Subjective:  Patient ID: Kim Sanchez, female    DOB: 07-31-1940  Age: 75 y.o. MRN: [id number]  CC: The primary encounter diagnosis was Essential hypertension. Diagnoses of Impaired fasting blood sugar, Insomnia due to anxiety and fear, and Edema, unspecified type were also pertinent to this visit.  HPI Kim Sanchez presents for  elevated blood pressure.  Patient has a history of hypertension and generalized anxiety with multiple antihypertensive medication intolerances.  Has been taken off of all antihypertensives by concurrent agreement with her cardiologist ,  Dr. Ubaldo Glassing,  And had been remaining relatively normotensive for the last six months,  but has had increased stress as fulltime caregiver for husband, who suffered a stroke last year (Jan 2016) with residual left sided weakness.  Patient's husband is ambulatory since dc from rehab ,  But is noncompliant with slef directed PT and has had several minor falls at home.  Patient ha shad to assume responsibility for preparing 3 meals daily along with housekeeping, and husband has become irritable with her when she reminds him to do his PT.  She is tearful today. Home BPs have been over 491 systolic frequently.  Denies headaches, chest pain and dyspnea,  She has had recurrent lower extremity edema with higher doses of amlodipine an dis intolerant of HCTZ and lisinopril. .        Outpatient Prescriptions Prior to Visit  Medication Sig Dispense Refill  . anastrozole (ARIMIDEX) 1 MG tablet Take 1 tablet (1 mg total) by mouth daily. 30 tablet 5  . Biotin 10 MG CAPS Take by mouth.    . Calcium Carb-Cholecalciferol (CALCIUM + D3) 600-200 MG-UNIT TABS Take by mouth daily.     . Cholecalciferol (VITAMIN D3) 2000 UNITS TABS Take 1 tablet by mouth daily.    . Fiber CHEW Chew by mouth as needed.    . fish oil-omega-3 fatty acids 1000 MG capsule Take 2 g by mouth as needed.     Marland Kitchen ibuprofen (ADVIL,MOTRIN) 200 MG tablet Take 200 mg by mouth every 6 (six) hours  as needed.    . inFLIXimab (REMICADE) 100 MG injection Inject 1 mg into the vein every 8 (eight) weeks.    . Multiple Vitamin (MULTIVITAMIN) tablet Take 1 tablet by mouth daily.    Marland Kitchen nystatin (MYCOSTATIN) 100000 UNIT/ML suspension Take 5 mLs (500,000 Units total) by mouth 4 (four) times daily. 120 mL 0  . polyethylene glycol (MIRALAX / GLYCOLAX) packet Take 17 g by mouth daily as needed.     . Tdap (BOOSTRIX) 5-2.5-18.5 LF-MCG/0.5 injection Inject 0.5 mLs into the muscle once. 0.5 mL 0  . LORazepam (ATIVAN) 0.5 MG tablet Take 1 tablet (0.5 mg total) by mouth 2 (two) times daily. As needed for anxiety 60 tablet 5  . Red Yeast Rice 600 MG CAPS Take by mouth as needed. Reported on 05/09/2015     No facility-administered medications prior to visit.    Review of Systems;  Patient denies headache, fevers, malaise, unintentional weight loss, skin rash, eye pain, sinus congestion and sinus pain, sore throat, dysphagia,  hemoptysis , cough, dyspnea, wheezing, chest pain, palpitations, orthopnea, edema, abdominal pain, nausea, melena, diarrhea, constipation, flank pain, dysuria, hematuria, urinary  Frequency, nocturia, numbness, tingling, seizures,  Focal weakness, Loss of consciousness,  Tremor, insomnia, depression, anxiety, and suicidal ideation.      Objective:  BP 164/84 mmHg  Pulse 75  Temp(Src) 98.3 F (36.8 C) (Oral)  Resp 12  Ht 5' 2"  (1.575 m)  Wt 132 lb 6 oz (60.045 kg)  BMI 24.21 kg/m2  SpO2 97%  BP Readings from Last 3 Encounters:  05/09/15 164/84  04/11/15 138/78  03/20/15 140/78    Wt Readings from Last 3 Encounters:  05/09/15 132 lb 6 oz (60.045 kg)  04/11/15 134 lb (60.782 kg)  03/20/15 134 lb 3.2 oz (60.873 kg)    General appearance: alert, cooperative and appears stated age Ears: normal TM's and external ear canals both ears Throat: lips, mucosa, and tongue normal; teeth and gums normal Neck: no adenopathy, no carotid bruit, supple, symmetrical, trachea midline and  thyroid not enlarged, symmetric, no tenderness/mass/nodules Back: symmetric, no curvature. ROM normal. No CVA tenderness. Lungs: clear to auscultation bilaterally Heart: regular rate and rhythm, S1, S2 normal, no murmur, click, rub or gallop Abdomen: soft, non-tender; bowel sounds normal; no masses,  no organomegaly Pulses: 2+ and symmetric Skin: Skin color, texture, turgor normal. No rashes or lesions Lymph nodes: Cervical, supraclavicular, and axillary nodes normal.  Lab Results  Component Value Date   HGBA1C 6.4 03/15/2015   HGBA1C 6.1 08/30/2014   HGBA1C 6.6* 04/11/2014    Lab Results  Component Value Date   CREATININE 0.67 03/15/2015   CREATININE 0.67 08/30/2014   CREATININE 0.6 04/11/2014    Lab Results  Component Value Date   WBC 7.1 08/30/2014   HGB 13.4 08/30/2014   HCT 39.1 08/30/2014   PLT 245.0 08/30/2014   GLUCOSE 100* 03/15/2015   CHOL 255* 08/30/2014   TRIG 96.0 08/30/2014   HDL 79.20 08/30/2014   LDLDIRECT 151.5 10/03/2013   LDLCALC 157* 08/30/2014   ALT 11 03/15/2015   AST 15 03/15/2015   NA 142 03/15/2015   K 4.1 03/15/2015   CL 104 03/15/2015   CREATININE 0.67 03/15/2015   BUN 11 03/15/2015   CO2 30 03/15/2015   TSH 2.08 08/30/2014   HGBA1C 6.4 03/15/2015   MICROALBUR 0.4 04/11/2014    No results found.  Assessment & Plan:   Problem List Items Addressed This Visit    Hypertension - Primary    For the last 6 months her blood pressure had been managed with anxiolytics, given multiple intolerances to medications. However, will resume amlodipine 2.5 mg daily         Relevant Medications   amLODipine (NORVASC) 2.5 MG tablet   Insomnia due to anxiety and fear    Improved with use of lorazepam. The risks and benefits of benzodiazepine use were reviewed with patient today including excessive sedation leading to respiratory depression,  impaired thinking/driving, and addiction.  Patient does not drink alcohol, and has been reminded to use  medication only as needed and not to share with others  .         Impaired fasting blood sugar    Her A1c is elevated , suggesting she has diet controlled DM.  She is not overweight and has a careful diet.  Will repeat in 6 months.  She has a documented intolerance to lisinopril and no proteinuria by Dec 2015 assessment.    Lab Results  Component Value Date   HGBA1C 6.4 03/15/2015   Lab Results  Component Value Date   MICROALBUR 0.4 04/11/2014             Edema    She was given an rx for furosemide to use prn,  Not daily,  baseline potassium level does not require supplementation.  Lab Results  Component Value Date   NA 142 03/15/2015   K  4.1 03/15/2015   CL 104 03/15/2015   CO2 30 03/15/2015   Lab Results  Component Value Date   CREATININE 0.67 03/15/2015           A total of 25 minutes of face to face time was spent with patient more than half of which was spent in counselling about the above mentioned conditions  and coordination of care  I am having Ms. Bufkin start on amLODipine. I am also having her maintain her polyethylene glycol, ibuprofen, Calcium + D3, multivitamin, Biotin, Red Yeast Rice, fish oil-omega-3 fatty acids, Fiber, inFLIXimab, Vitamin D3, nystatin, Tdap, anastrozole, and LORazepam.  Meds ordered this encounter  Medications  . amLODipine (NORVASC) 2.5 MG tablet    Sig: Take 1 tablet (2.5 mg total) by mouth daily.    Dispense:  90 tablet    Refill:  3  . DISCONTD: furosemide (LASIX) 20 MG tablet    Sig: Take 1 tablet (20 mg total) by mouth daily. As needed for fluid retention    Dispense:  30 tablet    Refill:  0  . LORazepam (ATIVAN) 0.5 MG tablet    Sig: Take 1 tablet (0.5 mg total) by mouth 2 (two) times daily. As needed for anxiety    Dispense:  60 tablet    Refill:  5    90 days supply not authorized bc it is a controlled substance    Medications Discontinued During This Encounter  Medication Reason  . LORazepam (ATIVAN) 0.5 MG  tablet Reorder    Follow-up: Return in about 2 weeks (around 05/23/2015).   [name], MD

## 2015-05-09 NOTE — Patient Instructions (Addendum)
I am recommending that you resume amlodipine once daily for your blood pressure  I am also prescribing a fluid a pill to take AS NEEDED for fluid retention.  Always take it in the morning because it will work best  And DO NOT [place] because it ill make you urinate a lot.    Ask Dr Sabra Heck to consider prescribing your husband Mirtazapine for depression .

## 2015-05-09 NOTE — Progress Notes (Signed)
Pre-visit discussion using our clinic review tool. No additional management support is needed unless otherwise documented below in the visit note.  

## 2015-05-12 DIAGNOSIS — R609 Edema, unspecified: Secondary | ICD-10-CM | POA: Insufficient documentation

## 2015-05-12 NOTE — Assessment & Plan Note (Signed)
Improved with use of lorazepam. The risks and benefits of benzodiazepine use were reviewed with patient today including excessive sedation leading to respiratory depression,  impaired thinking/driving, and addiction.  Patient does not drink alcohol, and has been reminded to use medication only as needed and not to share with others  .

## 2015-05-12 NOTE — Assessment & Plan Note (Signed)
She was given an rx for furosemide to use prn,  Not daily,  baseline potassium level does not require supplementation.  Lab Results  Component Value Date   NA 142 03/15/2015   K 4.1 03/15/2015   CL 104 03/15/2015   CO2 30 03/15/2015   Lab Results  Component Value Date   CREATININE 0.67 03/15/2015

## 2015-05-12 NOTE — Assessment & Plan Note (Signed)
For the last 6 months her blood pressure had been managed with anxiolytics, given multiple intolerances to medications. However, will resume amlodipine 2.5 mg daily

## 2015-05-12 NOTE — Assessment & Plan Note (Signed)
Her A1c is elevated , suggesting she has diet controlled DM.  She is not overweight and has a careful diet.  Will repeat in 6 months.  She has a documented intolerance to lisinopril and no proteinuria by Dec 2015 assessment.    Lab Results  Component Value Date   HGBA1C 6.4 03/15/2015   Lab Results  Component Value Date   MICROALBUR 0.4 04/11/2014

## 2015-05-16 DIAGNOSIS — K5090 Crohn's disease, unspecified, without complications: Secondary | ICD-10-CM | POA: Diagnosis not present

## 2015-05-29 ENCOUNTER — Ambulatory Visit: Payer: Medicare Other | Admitting: Internal Medicine

## 2015-05-31 ENCOUNTER — Ambulatory Visit
Admission: RE | Admit: 2015-05-31 | Discharge: 2015-05-31 | Disposition: A | Discharge status: Home / Self Care | Payer: Medicare Other | Source: Ambulatory Visit | Attending: Radiation Oncology | Admitting: Radiation Oncology

## 2015-05-31 ENCOUNTER — Encounter: Payer: Self-pay | Admitting: Radiation Oncology

## 2015-05-31 VITALS — BP 159/75 | HR 73 | Temp 96.5°F | Resp 18 | Ht 62.5 in | Wt 134.0 lb

## 2015-05-31 DIAGNOSIS — C50911 Malignant neoplasm of unspecified site of right female breast: Secondary | ICD-10-CM | POA: Diagnosis not present

## 2015-05-31 NOTE — Progress Notes (Signed)
Radiation Oncology Follow up Note  Name: Kim Sanchez   Date:   05/31/2015 MRN:  [id number] DOB: [birth year]    This 75 y.o. female presents to the clinic today for follow-up for. 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 She had been on. Letrozole following that well. Patient felt some thickening in the scar site had a needle biopsy performed by surgeon which was benign. She continues to have follow-up mammograms at [facility] may have been fine. She specifically denies breast tenderness cough or bone pain.  COMPLICATIONS OF TREATMENT: none  FOLLOW UP COMPLIANCE: keeps appointments   PHYSICAL EXAM:  BP 159/75 mmHg  Pulse 73  Temp(Src) 96.5 F (35.8 C)  Resp 18  Ht 5' 2.5" (1.588 m)  Wt 134 lb 0.6 oz (60.8 kg)  BMI 24.11 kg/m2 Lungs are clear to A&P cardiac examination essentially unremarkable with regular rate and rhythm. No dominant mass or nodularity is noted in either breast in 2 positions examined. Incision is well-healed. No axillary or supraclavicular adenopathy is appreciated. Cosmetic result is excellent. Some slight shrinkage of the right breast which is expected although the cosmetic result is still good to excellent. Well-developed well-nourished patient in NAD. HEENT reveals PERLA, EOMI, discs not visualized.  Oral cavity is clear. No oral mucosal lesions are identified. Neck is clear without evidence of cervical or supraclavicular adenopathy. Lungs are clear to A&P. Cardiac examination is essentially unremarkable with regular rate and rhythm without murmur rub or thrill. Abdomen is benign with no organomegaly or masses noted. Motor sensory and DTR  levels are equal and symmetric in the upper and lower extremities. Cranial nerves II through XII are grossly intact. Proprioception is intact. No peripheral adenopathy or edema is identified. No motor or sensory levels are noted. Crude visual fields are within normal range.  RADIOLOGY RESULTS: A results of mammograms from [facility] are reviewed showing no evidence of disease.  PLAN: Patient continues to do well now to half years out with no evidence of disease. I'm please were overall progress. She continues on letrozole without side effect. I have asked to see her back in 1 year for follow-up. She continues close follow-up care with surgeon.  I would like to take this opportunity for allowing me to participate in the care of your patient.Armstead Peaks., MD

## 2015-06-05 ENCOUNTER — Encounter: Payer: Self-pay | Admitting: Family Medicine

## 2015-06-05 ENCOUNTER — Ambulatory Visit (INDEPENDENT_AMBULATORY_CARE_PROVIDER_SITE_OTHER): Payer: Medicare Other | Admitting: Family Medicine

## 2015-06-05 VITALS — BP 146/80 | HR 81 | Temp 98.6°F | Ht 62.0 in | Wt 134.0 lb

## 2015-06-05 DIAGNOSIS — R220 Localized swelling, mass and lump, head: Secondary | ICD-10-CM | POA: Diagnosis not present

## 2015-06-05 DIAGNOSIS — N390 Urinary tract infection, site not specified: Secondary | ICD-10-CM

## 2015-06-05 DIAGNOSIS — R300 Dysuria: Secondary | ICD-10-CM | POA: Diagnosis not present

## 2015-06-05 LAB — POCT URINALYSIS DIPSTICK
Bilirubin, UA: NEGATIVE
GLUCOSE UA: NEGATIVE
Ketones, UA: NEGATIVE
NITRITE UA: NEGATIVE
PH UA: 7
Protein, UA: 100
Spec Grav, UA: 1.02
UROBILINOGEN UA: 0.2

## 2015-06-05 MED ORDER — CEPHALEXIN 500 MG PO CAPS: 500.0000 mg | ORAL_CAPSULE | Freq: Two times a day (BID) | ORAL | Status: DC

## 2015-06-05 NOTE — Assessment & Plan Note (Signed)
New problem. History and urinalysis consistent with UTI. Sending for culture. Treating empirically with Keflex.

## 2015-06-05 NOTE — Patient Instructions (Signed)
Take the antibiotic Keflex twice daily as prescribed.  You exam was otherwise unremarkable.   If the issues with your tongue persist, please follow up with Dr. Derrel Nip.  Take care  Dr. Lacinda Axon

## 2015-06-05 NOTE — Progress Notes (Signed)
Subjective:  Patient ID: Kim Sanchez, female    DOB: 18-Jun-1940  Age: 75 y.o. MRN: [id number]  CC: ? UTI  HPI:  75 year old female presents to the clinic today with concerns that she may have a UTI. Additionally, she states that she's had a white coating on her tongue as well as tongue swelling which she would like to address.  UTI  Patient reports that she has recently developed burning with urination.  She denies any frequency but reports urgency.  She also reports some itching in the vaginal area but denies any vaginal discharge.  No exacerbating or relieving factors.  No associated abdominal pain.  No fevers or chills.  No flank pain.  Tongue swelling/white coating  Patient reports that for the past few days she's had a white coating on her tongue as well as a swollen tongue upon awakening.  She states that this began after starting a new blood pressure medication.  She is concerned about this and would like to be examined today.  She's not currently having issues.  No exacerbating factors.  She does state that she has had thrush in the past.  Social Hx   Social History   Social History  . Marital Status: Married    Spouse Name: N/A  . Number of Children: N/A  . Years of Education: N/A   Social History Main Topics  . Smoking status: Never Smoker   . Smokeless tobacco: Never Used  . Alcohol Use: No  . Drug Use: No  . Sexual Activity: Not Currently   Other Topics Concern  . None   Social History Narrative   Review of Systems  Constitutional: Negative.   Genitourinary: Positive for dysuria and urgency. Negative for frequency and flank pain.   Objective:  BP 146/80 mmHg  Pulse 81  Temp(Src) 98.6 F (37 C) (Oral)  Ht 5' 2"  (1.575 m)  Wt 134 lb (60.782 kg)  BMI 24.50 kg/m2  SpO2 97%  BP/Weight 06/05/2015 08/03/270 09/04/6642  Systolic BP 034 742 595  Diastolic BP 80 75 84  Wt. (Lbs) 134 134.04 132.38  BMI 24.5 24.11 24.21   Physical Exam    Constitutional: She is oriented to person, place, and time. She appears well-developed. No distress.  HENT:  Oropharynx clear. Tongue normal in appearance and size. No appreciable thrush.  Cardiovascular: Normal rate and regular rhythm.   Murmur heard. Pulmonary/Chest: Effort normal and breath sounds normal.  Abdominal: Soft. She exhibits no distension. There is no tenderness.  Neurological: She is alert and oriented to person, place, and time.  Psychiatric: She has a normal mood and affect.  Vitals reviewed.  Lab Results  Component Value Date   WBC 7.1 08/30/2014   HGB 13.4 08/30/2014   HCT 39.1 08/30/2014   PLT 245.0 08/30/2014   GLUCOSE 100* 03/15/2015   CHOL 255* 08/30/2014   TRIG 96.0 08/30/2014   HDL 79.20 08/30/2014   LDLDIRECT 151.5 10/03/2013   LDLCALC 157* 08/30/2014   ALT 11 03/15/2015   AST 15 03/15/2015   NA 142 03/15/2015   K 4.1 03/15/2015   CL 104 03/15/2015   CREATININE 0.67 03/15/2015   BUN 11 03/15/2015   CO2 30 03/15/2015   TSH 2.08 08/30/2014   HGBA1C 6.4 03/15/2015   MICROALBUR 0.4 04/11/2014   Results for orders placed or performed in visit on 06/05/15 (from the past 24 hour(s))  POCT urinalysis dipstick     Status: Abnormal   Collection Time: 06/05/15  3:20  PM  Result Value Ref Range   Color, UA yellow    Clarity, UA clear    Glucose, UA neg    Bilirubin, UA neg    Ketones, UA neg    Spec Grav, UA 1.020    Blood, UA moderate    pH, UA 7.0    Protein, UA 100    Urobilinogen, UA 0.2    Nitrite, UA neg    Leukocytes, UA moderate (2+) (A) Negative   Assessment & Plan:   Problem List Items Addressed This Visit    Tongue swelling    New problem. Patient was reported tongue swelling and "white coating". Exam revealed a normal tongue today. Advised close follow-up with PCP.      UTI (lower urinary tract infection) - Primary    New problem. History and urinalysis consistent with UTI. Sending for culture. Treating empirically with  Keflex.      Relevant Medications   cephALEXin (KEFLEX) 500 MG capsule   Other Relevant Orders   POCT urinalysis dipstick (Completed)   Urine culture      Meds ordered this encounter  Medications  . cephALEXin (KEFLEX) 500 MG capsule    Sig: Take 1 capsule (500 mg total) by mouth 2 (two) times daily.    Dispense:  10 capsule    Refill:  0    Follow-up: PRN  Lee Mont

## 2015-06-05 NOTE — Assessment & Plan Note (Signed)
New problem. Patient was reported tongue swelling and "white coating". Exam revealed a normal tongue today. Advised close follow-up with PCP.

## 2015-06-07 LAB — URINE CULTURE

## 2015-07-03 ENCOUNTER — Other Ambulatory Visit: Payer: Self-pay | Admitting: *Deleted

## 2015-07-03 MED ORDER — ANASTROZOLE 1 MG PO TABS: 1.0000 mg | ORAL_TABLET | Freq: Every day | ORAL | Status: DC

## 2015-07-15 ENCOUNTER — Encounter: Payer: Self-pay | Admitting: *Deleted

## 2015-07-18 DIAGNOSIS — K5090 Crohn's disease, unspecified, without complications: Secondary | ICD-10-CM | POA: Diagnosis not present

## 2015-07-18 DIAGNOSIS — K5000 Crohn's disease of small intestine without complications: Secondary | ICD-10-CM | POA: Diagnosis not present

## 2015-07-23 ENCOUNTER — Ambulatory Visit: Payer: Medicare Other | Admitting: Oncology

## 2015-07-23 ENCOUNTER — Inpatient Hospital Stay: Payer: Medicare Other | Attending: Oncology | Admitting: Oncology

## 2015-07-23 VITALS — BP 163/91 | HR 97 | Temp 97.9°F | Resp 18 | Ht 62.0 in | Wt 136.2 lb

## 2015-07-23 DIAGNOSIS — I10 Essential (primary) hypertension: Secondary | ICD-10-CM | POA: Insufficient documentation

## 2015-07-23 DIAGNOSIS — K5090 Crohn's disease, unspecified, without complications: Secondary | ICD-10-CM | POA: Insufficient documentation

## 2015-07-23 DIAGNOSIS — M129 Arthropathy, unspecified: Secondary | ICD-10-CM | POA: Insufficient documentation

## 2015-07-23 DIAGNOSIS — F419 Anxiety disorder, unspecified: Secondary | ICD-10-CM | POA: Insufficient documentation

## 2015-07-23 DIAGNOSIS — G4700 Insomnia, unspecified: Secondary | ICD-10-CM | POA: Diagnosis not present

## 2015-07-23 DIAGNOSIS — C50911 Malignant neoplasm of unspecified site of right female breast: Secondary | ICD-10-CM | POA: Insufficient documentation

## 2015-07-23 DIAGNOSIS — R030 Elevated blood-pressure reading, without diagnosis of hypertension: Secondary | ICD-10-CM | POA: Insufficient documentation

## 2015-07-23 DIAGNOSIS — Z79811 Long term (current) use of aromatase inhibitors: Secondary | ICD-10-CM | POA: Insufficient documentation

## 2015-07-23 DIAGNOSIS — K219 Gastro-esophageal reflux disease without esophagitis: Secondary | ICD-10-CM | POA: Insufficient documentation

## 2015-07-23 DIAGNOSIS — Z79899 Other long term (current) drug therapy: Secondary | ICD-10-CM | POA: Diagnosis not present

## 2015-07-23 DIAGNOSIS — Z170 Estrogen receptor positive status [ER+]: Secondary | ICD-10-CM | POA: Insufficient documentation

## 2015-07-23 MED ORDER — ANASTROZOLE 1 MG PO TABS: 1.0000 mg | ORAL_TABLET | Freq: Every day | ORAL | Status: DC

## 2015-07-23 NOTE — Progress Notes (Signed)
No changes last visit other than Dr. Fleet Contras did a biopsy on right breast concerns of changes.  Pt reports being currently stressed of husband having 2 mini strokes and the need to do more and take care of husband.

## 2015-07-29 ENCOUNTER — Ambulatory Visit: Payer: Medicare Other | Admitting: Oncology

## 2015-07-30 ENCOUNTER — Ambulatory Visit: Payer: Medicare Other | Admitting: Oncology

## 2015-08-05 NOTE — Progress Notes (Signed)
[facility]  Telephone:([phone]) [phone] Fax:([phone]) [phone]  ID: Kim Sanchez OB: [birth year]  MR#: [id number]  GHW#:[id number]  Patient Care Team: [name], MD as PCP - General (Internal Medicine) [name], MD (General Surgery) [name], MD (Internal Medicine)  CHIEF COMPLAINT:  Chief Complaint  Patient presents with  . Follow-up    Breast Cancer    INTERVAL HISTORY: Patient returns to clinic today for routine six-month evaluation. She Had a recent right breast biopsy for a suspicious lesion that was negative for malignancy. She has increased anxiety secondary to her husband recently having CVA. She continues to tolerate anastrozole without significant side effects. She has no neurologic complaints.  She denies any fevers.  She has a good appetite and denies weight loss.  She denies any chest pain or shortness of breath.  She has no nausea, vomiting, constipation, or diarrhea.  She has no urinary complaints.  Patient offers no further specific complaints today.   REVIEW OF SYSTEMS:   Review of Systems  Constitutional: Negative.  Negative for fever, weight loss and malaise/fatigue.  Respiratory: Negative.  Negative for shortness of breath.   Cardiovascular: Negative.  Negative for chest pain.  Gastrointestinal: Negative.   Musculoskeletal: Negative.   Neurological: Negative.  Negative for weakness.  Psychiatric/Behavioral: The patient is nervous/anxious and has insomnia.     As per HPI. Otherwise, a complete review of systems is negatve.  PAST MEDICAL HISTORY: Past Medical History  Diagnosis Date  . Crohn's disease ([place])   . Arthritis   . GERD (gastroesophageal reflux disease)   . History of blood transfusion 1960  . Hypertension   . Breast cancer, right breast ([place]) 08/24/2012    Histologic grade 1, invasive mammary carcinoma, no specific type. 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     PAST SURGICAL HISTORY: Past Surgical History  Procedure Laterality Date  . Dilation and curettage of uterus    . Brain surgery  1980    breast biopsies, benign  . Breast surgery  1980's    fibro cyst both breast in [place]  . Breast lumpectomy Right May 2014    T1c, N0; ER/PR positive, HER-2/neu not overexpressing.  . Colonoscopy      FAMILY HISTORY Family History  Problem Relation Age of Onset  . Cancer Paternal Grandmother        ADVANCED DIRECTIVES:    HEALTH MAINTENANCE: Social History  Substance Use Topics  . Smoking status: Never Smoker   . Smokeless tobacco: Never Used  . Alcohol Use: No     Colonoscopy:  PAP:  Bone density:  Lipid panel:  Allergies  Allergen Reactions  . Letrozole Anaphylaxis and Itching  . Exemestane Nausea Only  . Hydrochlorothiazide Other (See Comments)  . Lisinopril Cough  . Sulfa Antibiotics Other (See Comments)    Current Outpatient Prescriptions  Medication Sig Dispense Refill  . anastrozole (ARIMIDEX) 1 MG tablet Take 1 tablet (1 mg total) by mouth daily. 90 tablet 1  . Biotin 10 MG CAPS Take by mouth.    . Calcium Carb-Cholecalciferol (CALCIUM + D3) 600-200 MG-UNIT TABS Take by mouth daily.     . cephALEXin (KEFLEX) 500 MG capsule Take 1 capsule (500 mg total) by mouth 2 (two) times daily. 10 capsule 0  . Cholecalciferol (VITAMIN D3) 2000 UNITS TABS Take 1 tablet by mouth daily.    . Fiber CHEW Chew by mouth as needed.    Marland Kitchen  fish oil-omega-3 fatty acids 1000 MG capsule Take 2 g by mouth as needed.     . furosemide (LASIX) 20 MG tablet TAKE 1 TABLET(20 MG) BY MOUTH DAILY AS NEEDED FOR FLUID RETENTION 90 tablet 0  . ibuprofen (ADVIL,MOTRIN) 200 MG tablet Take 200 mg by mouth every 6 (six) hours as needed.    . inFLIXimab (REMICADE) 100 MG injection Inject 1 mg into the vein every 8 (eight) weeks.    Marland Kitchen LORazepam (ATIVAN) 0.5 MG tablet Take 1 tablet (0.5 mg total) by mouth 2 (two) times daily. As needed for anxiety 60 tablet  5  . Multiple Vitamin (MULTIVITAMIN) tablet Take 1 tablet by mouth daily.    Marland Kitchen nystatin (MYCOSTATIN) 100000 UNIT/ML suspension Take 5 mLs (500,000 Units total) by mouth 4 (four) times daily. 120 mL 0  . polyethylene glycol (MIRALAX / GLYCOLAX) packet Take 17 g by mouth daily as needed.     . Tdap (BOOSTRIX) 5-2.5-18.5 LF-MCG/0.5 injection Inject 0.5 mLs into the muscle once. 0.5 mL 0   No current facility-administered medications for this visit.    OBJECTIVE: Filed Vitals:   07/23/15 1432  BP: 163/91  Pulse: 97  Temp: 97.9 F (36.6 C)  Resp: 18     Body mass index is 24.91 kg/(m^2).    ECOG FS:0 - Asymptomatic  General: Well-developed, well-nourished, no acute distress. Eyes: Pink conjunctiva, anicteric sclera. Breasts: Bilateral breast ans axilla without lumps or masses. Lungs: Clear to auscultation bilaterally. Heart: Regular rate and rhythm. No rubs, murmurs, or gallops. Abdomen: Soft, nontender, nondistended. No organomegaly noted, normoactive bowel sounds. Musculoskeletal: No edema, cyanosis, or clubbing. Neuro: Alert, answering all questions appropriately. Cranial nerves grossly intact. Skin: No rashes or petechiae noted. Psych: Normal affect.   LAB RESULTS:  Lab Results  Component Value Date   NA 142 03/15/2015   K 4.1 03/15/2015   CL 104 03/15/2015   CO2 30 03/15/2015   GLUCOSE 100* 03/15/2015   BUN 11 03/15/2015   CREATININE 0.67 03/15/2015   CALCIUM 9.5 03/15/2015   PROT 7.1 03/15/2015   ALBUMIN 4.1 03/15/2015   AST 15 03/15/2015   ALT 11 03/15/2015   ALKPHOS 86 03/15/2015   BILITOT 0.8 03/15/2015   GFRNONAA >60 08/30/2012   GFRAA >60 08/30/2012    Lab Results  Component Value Date   WBC 7.1 08/30/2014   NEUTROABS 4.5 08/30/2014   HGB 13.4 08/30/2014   HCT 39.1 08/30/2014   MCV 88.5 08/30/2014   PLT 245.0 08/30/2014     STUDIES: No results found.  ASSESSMENT: Stage Ia ER/PR positive, HER-2 negative adenocarcinoma of the right breast, no  Oncotype score available.  PLAN:    1.  Breast cancer: Patient could not tolerate Aromasin or letrozole.  Continue with anastrozole, completing in September of 2019.  Recent biopsy of her right breast was negative for malignancy. Return to clinic in 6 months for routine evaluation. 2. Postmenopausal:  Bone mineral density on January 23, 2014 with a T score of -1.0 with his which is essentially unchanged. Repeat in 6 months prior to next clinic visit.  2. Arthritis: Continue Remicade as prescribed by her rheumatologist. 3. Elevated blood pressure: Monitor, continue treatment per PCP.  Patient expressed understanding and was in agreement with this plan. She also understands that She can call clinic at any time with any questions, concerns, or complaints.    Lloyd Huger, MD   08/05/2015 8:38 AM

## 2015-08-19 DIAGNOSIS — J069 Acute upper respiratory infection, unspecified: Secondary | ICD-10-CM | POA: Diagnosis not present

## 2015-09-11 ENCOUNTER — Ambulatory Visit: Payer: Medicare Other | Admitting: Internal Medicine

## 2015-09-17 DIAGNOSIS — K5000 Crohn's disease of small intestine without complications: Secondary | ICD-10-CM | POA: Diagnosis not present

## 2015-09-19 ENCOUNTER — Ambulatory Visit: Payer: Medicare Other | Admitting: Internal Medicine

## 2015-10-01 ENCOUNTER — Other Ambulatory Visit: Payer: Self-pay

## 2015-10-01 ENCOUNTER — Encounter: Payer: Self-pay | Admitting: General Surgery

## 2015-10-01 DIAGNOSIS — C50012 Malignant neoplasm of nipple and areola, left female breast: Secondary | ICD-10-CM | POA: Diagnosis not present

## 2015-10-01 DIAGNOSIS — C50011 Malignant neoplasm of nipple and areola, right female breast: Secondary | ICD-10-CM | POA: Diagnosis not present

## 2015-10-01 DIAGNOSIS — R922 Inconclusive mammogram: Secondary | ICD-10-CM | POA: Diagnosis not present

## 2015-10-01 MED ORDER — ANASTROZOLE 1 MG PO TABS: 1.0000 mg | ORAL_TABLET | Freq: Every day | ORAL | Status: DC

## 2015-10-02 ENCOUNTER — Encounter: Payer: Self-pay | Admitting: Internal Medicine

## 2015-10-02 ENCOUNTER — Ambulatory Visit (INDEPENDENT_AMBULATORY_CARE_PROVIDER_SITE_OTHER): Payer: Medicare Other | Admitting: Internal Medicine

## 2015-10-02 ENCOUNTER — Other Ambulatory Visit: Payer: Self-pay | Admitting: Internal Medicine

## 2015-10-02 VITALS — BP 158/88 | HR 76 | Temp 97.8°F | Resp 12 | Ht 62.0 in | Wt 135.0 lb

## 2015-10-02 DIAGNOSIS — R7303 Prediabetes: Secondary | ICD-10-CM

## 2015-10-02 DIAGNOSIS — F411 Generalized anxiety disorder: Secondary | ICD-10-CM

## 2015-10-02 DIAGNOSIS — I10 Essential (primary) hypertension: Secondary | ICD-10-CM | POA: Diagnosis not present

## 2015-10-02 LAB — MICROALBUMIN / CREATININE URINE RATIO
Creatinine,U: 194.1 mg/dL
MICROALB UR: 1.3 mg/dL (ref 0.0–1.9)
Microalb Creat Ratio: 0.7 mg/g (ref 0.0–30.0)

## 2015-10-02 LAB — COMPREHENSIVE METABOLIC PANEL
ALBUMIN: 4.3 g/dL (ref 3.5–5.2)
ALT: 14 U/L (ref 0–35)
AST: 18 U/L (ref 0–37)
Alkaline Phosphatase: 82 U/L (ref 39–117)
BILIRUBIN TOTAL: 0.9 mg/dL (ref 0.2–1.2)
BUN: 18 mg/dL (ref 6–23)
CALCIUM: 9.5 mg/dL (ref 8.4–10.5)
CO2: 30 meq/L (ref 19–32)
CREATININE: 0.66 mg/dL (ref 0.40–1.20)
Chloride: 103 mEq/L (ref 96–112)
GFR: 92.93 mL/min (ref >60.00)
Glucose, Bld: 108 mg/dL — ABNORMAL HIGH (ref 70–99)
Potassium: 3.9 mEq/L (ref 3.5–5.1)
Sodium: 139 mEq/L (ref 135–145)
Total Protein: 7.3 g/dL (ref 6.0–8.3)

## 2015-10-02 LAB — HEMOGLOBIN A1C: HEMOGLOBIN A1C: 6.3 % (ref 4.6–6.5)

## 2015-10-02 MED ORDER — LOSARTAN POTASSIUM-HCTZ 50-12.5 MG PO TABS: 1.0000 | ORAL_TABLET | Freq: Every day | ORAL | Status: DC

## 2015-10-02 MED ORDER — ESCITALOPRAM OXALATE 5 MG PO TABS: 5.0000 mg | ORAL_TABLET | Freq: Every day | ORAL | Status: DC

## 2015-10-02 NOTE — Patient Instructions (Signed)
I am Starting losartan/hctz combination pill for your blood pressure: once daily in the morning  After one week starting lexapro for your anxiety.  Start with 1/2 tablet daily with dinner,  After one week if you tolerate it,  You can increase to a full tablet  This is an SSRI ,  Not a benzodiazepine,  And is NOT ADDICTIVE.  You can continue using lorazepam   Your last A1c in November suggested that you are at risk for developing diabetes  I M RECHECKING IT today .  Return in one month

## 2015-10-02 NOTE — Progress Notes (Signed)
Pre-visit discussion using our clinic review tool. No additional management support is needed unless otherwise documented below in the visit note.  

## 2015-10-02 NOTE — Progress Notes (Signed)
Subjective:  Patient ID: Kim Sanchez, female    DOB: February 02, 1941  Age: 75 y.o. MRN: [id number]  CC: The primary encounter diagnosis was Essential hypertension. Diagnoses of Prediabetes and Generalized anxiety disorder were also pertinent to this visit.  HPI Kim Sanchez presents for follow up on chronic conditions  Including anxiety,  Hypertension with multiple drug intolerances making treatment difficult.  Stopped the amlodipine in March bc it made her itch and gave her a bad taste in her mouth  prior intolerance to hctz and lisinopril  Documented.  Itching stopped i a few days,   The thrush  was NOT CAUSED by amlodipine.Patient taking remicaide for Crohn's Disease  Home readings have been as high as 170/80  Low is 139/ 69.      Having a lot of anxiety , feels jittery , using lorazepam twice  daily.  Husband still disbled from CVA and recurretn skin cancer on his nose,  Has a heart condition and bleeds frequently which makes her very nervous  Thinks she might be getting ana allergy because her chronic tinnitus gets louder afro a short period of time after she takes.  Left thumb and right middle finger getting more difficult to extend,  Worse in the morning doesn't want to see anybody  Mammogram was normal [facility] May 30 follow up one year.  Very painful to right breast since her breast surgery   Had a fingerstick lipid panel done fasting at [place]   No signiicant CHANGE SINCE last year .  HDL 80  265 total LD  165     Fasting glucose  Still elevatd at 104.  Discussed prediabetes,  Last a1c 6.4          Outpatient Prescriptions Prior to Visit  Medication Sig Dispense Refill  . anastrozole (ARIMIDEX) 1 MG tablet Take 1 tablet (1 mg total) by mouth daily. 90 tablet 1  . Biotin 10 MG CAPS Take by mouth.    . Calcium Carb-Cholecalciferol (CALCIUM + D3) 600-200 MG-UNIT TABS Take by mouth daily.     . Cholecalciferol (VITAMIN D3) 2000 UNITS TABS Take 1 tablet by mouth daily.    . Fiber CHEW  Chew by mouth as needed.    . fish oil-omega-3 fatty acids 1000 MG capsule Take 2 g by mouth as needed.     . furosemide (LASIX) 20 MG tablet TAKE 1 TABLET(20 MG) BY MOUTH DAILY AS NEEDED FOR FLUID RETENTION 90 tablet 0  . ibuprofen (ADVIL,MOTRIN) 200 MG tablet Take 200 mg by mouth every 6 (six) hours as needed.    . inFLIXimab (REMICADE) 100 MG injection Inject 1 mg into the vein every 8 (eight) weeks.    Marland Kitchen LORazepam (ATIVAN) 0.5 MG tablet Take 1 tablet (0.5 mg total) by mouth 2 (two) times daily. As needed for anxiety 60 tablet 5  . Multiple Vitamin (MULTIVITAMIN) tablet Take 1 tablet by mouth daily.    Marland Kitchen nystatin (MYCOSTATIN) 100000 UNIT/ML suspension Take 5 mLs (500,000 Units total) by mouth 4 (four) times daily. 120 mL 0  . polyethylene glycol (MIRALAX / GLYCOLAX) packet Take 17 g by mouth daily as needed.     . Tdap (BOOSTRIX) 5-2.5-18.5 LF-MCG/0.5 injection Inject 0.5 mLs into the muscle once. 0.5 mL 0  . cephALEXin (KEFLEX) 500 MG capsule Take 1 capsule (500 mg total) by mouth 2 (two) times daily. 10 capsule 0   No facility-administered medications prior to visit.    Review of Systems;  Patient denies  headache, fevers, malaise, unintentional weight loss, skin rash, eye pain, sinus congestion and sinus pain, sore throat, dysphagia,  hemoptysis , cough, dyspnea, wheezing, chest pain, palpitations, orthopnea, edema, abdominal pain, nausea, melena, diarrhea, constipation, flank pain, dysuria, hematuria, urinary  Frequency, nocturia, numbness, tingling, seizures,  Focal weakness, Loss of consciousness,  Tremor, insomnia, depression, anxiety, and suicidal ideation.      Objective:  BP 158/88 mmHg  Pulse 76  Temp(Src) 97.8 F (36.6 C) (Oral)  Resp 12  Ht 5' 2"  (1.575 m)  Wt 135 lb (61.236 kg)  BMI 24.69 kg/m2  SpO2 97%  BP Readings from Last 3 Encounters:  10/02/15 158/88  07/23/15 163/91  06/05/15 146/80    Wt Readings from Last 3 Encounters:  10/02/15 135 lb (61.236 kg)    07/23/15 136 lb 3.9 oz (61.8 kg)  06/05/15 134 lb (60.782 kg)    General appearance: alert, cooperative and appears stated age Ears: normal TM's and external ear canals both ears Throat: lips, mucosa, and tongue normal; teeth and gums normal Neck: no adenopathy, no carotid bruit, supple, symmetrical, trachea midline and thyroid not enlarged, symmetric, no tenderness/mass/nodules Back: symmetric, no curvature. ROM normal. No CVA tenderness. Lungs: clear to auscultation bilaterally Heart: regular rate and rhythm, S1, S2 normal, no murmur, click, rub or gallop Abdomen: soft, non-tender; bowel sounds normal; no masses,  no organomegaly Pulses: 2+ and symmetric Skin: Skin color, texture, turgor normal. No rashes or lesions Lymph nodes: Cervical, supraclavicular, and axillary nodes normal.  Lab Results  Component Value Date   HGBA1C 6.3 10/02/2015   HGBA1C 6.4 03/15/2015   HGBA1C 6.1 08/30/2014    Lab Results  Component Value Date   CREATININE 0.66 10/02/2015   CREATININE 0.67 03/15/2015   CREATININE 0.67 08/30/2014    Lab Results  Component Value Date   WBC 7.1 08/30/2014   HGB 13.4 08/30/2014   HCT 39.1 08/30/2014   PLT 245.0 08/30/2014   GLUCOSE 108* 10/02/2015   CHOL 255* 08/30/2014   TRIG 96.0 08/30/2014   HDL 79.20 08/30/2014   LDLDIRECT 151.5 10/03/2013   LDLCALC 157* 08/30/2014   ALT 14 10/02/2015   AST 18 10/02/2015   NA 139 10/02/2015   K 3.9 10/02/2015   CL 103 10/02/2015   CREATININE 0.66 10/02/2015   BUN 18 10/02/2015   CO2 30 10/02/2015   TSH 2.08 08/30/2014   HGBA1C 6.3 10/02/2015   MICROALBUR 1.3 10/02/2015    No results found.  Assessment & Plan:   Problem List Items Addressed This Visit    Hypertension - Primary    Uncontrolled due to multiple drug intolerances.  Trial of losartan/Hct      Relevant Orders   Comprehensive metabolic panel (Completed)   Microalbumin / creatinine urine ratio (Completed)   Generalized anxiety disorder     Trial of lexapro discussed and accepted.        Other Visit Diagnoses    Prediabetes        Relevant Orders    Hemoglobin A1c (Completed)      A total of 25 minutes of face to face time was spent with patient more than half of which was spent in counselling about the above mentioned conditions  and coordination of care  I have discontinued Ms. Curro's cephALEXin. I am also having her start on escitalopram. Additionally, I am having her maintain her polyethylene glycol, ibuprofen, Calcium + D3, multivitamin, Biotin, fish oil-omega-3 fatty acids, Fiber, inFLIXimab, Vitamin D3, nystatin, Tdap, LORazepam, furosemide, and  anastrozole.  Meds ordered this encounter  Medications  . DISCONTD: losartan-hydrochlorothiazide (HYZAAR) 50-12.5 MG tablet    Sig: Take 1 tablet by mouth daily.    Dispense:  30 tablet    Refill:  0  . escitalopram (LEXAPRO) 5 MG tablet    Sig: Take 1 tablet (5 mg total) by mouth daily.    Dispense:  30 tablet    Refill:  1    Medications Discontinued During This Encounter  Medication Reason  . cephALEXin (KEFLEX) 500 MG capsule Completed Course    Follow-up: Return in about 4 weeks (around 10/30/2015).   [name], MD

## 2015-10-04 ENCOUNTER — Encounter: Payer: Self-pay | Admitting: Internal Medicine

## 2015-10-04 NOTE — Assessment & Plan Note (Signed)
Uncontrolled due to multiple drug intolerances.  Trial of losartan/Hct

## 2015-10-04 NOTE — Assessment & Plan Note (Signed)
Trial of lexapro discussed and accepted  

## 2015-10-08 DIAGNOSIS — Z1211 Encounter for screening for malignant neoplasm of colon: Secondary | ICD-10-CM | POA: Diagnosis not present

## 2015-10-08 DIAGNOSIS — K5080 Crohn's disease of both small and large intestine without complications: Secondary | ICD-10-CM | POA: Diagnosis not present

## 2015-10-08 LAB — HM COLONOSCOPY

## 2015-10-22 ENCOUNTER — Encounter: Payer: Self-pay | Admitting: General Surgery

## 2015-10-22 ENCOUNTER — Ambulatory Visit (INDEPENDENT_AMBULATORY_CARE_PROVIDER_SITE_OTHER): Payer: Medicare Other | Admitting: General Surgery

## 2015-10-22 VITALS — BP 162/80 | HR 88 | Resp 16 | Ht 62.0 in | Wt 134.0 lb

## 2015-10-22 DIAGNOSIS — C50911 Malignant neoplasm of unspecified site of right female breast: Secondary | ICD-10-CM | POA: Diagnosis not present

## 2015-10-22 NOTE — Progress Notes (Signed)
Patient ID: Kim Sanchez, female   DOB: 09-10-1940, 75 y.o.   MRN: [id number]  Chief Complaint  Patient presents with  . Follow-up    mammogram    HPI Kim Sanchez is a 75 y.o. female who presents for a breast evaluation. The most recent mammogram was done on 10/01/15 .  Patient does perform regular self breast checks and gets regular mammograms done.    HPI  Past Medical History  Diagnosis Date  . Crohn's disease ([place])   . Arthritis   . GERD (gastroesophageal reflux disease)   . History of blood transfusion 1960  . Hypertension   . 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    Past Surgical History  Procedure Laterality Date  . Dilation and curettage of uterus    . Brain surgery  1980    breast biopsies, benign  . Breast surgery  1980's    fibro cyst both breast in [place]  . Breast lumpectomy Right May 2014    T1c, N0; ER/PR positive, HER-2/neu not overexpressing.  . Colonoscopy  2017    Family History  Problem Relation Age of Onset  . Cancer Paternal Grandmother     Social History Social History  Substance Use Topics  . Smoking status: Never Smoker   . Smokeless tobacco: Never Used  . Alcohol Use: No    Allergies  Allergen Reactions  . Letrozole Anaphylaxis and Itching  . Amlodipine Itching  . Exemestane Nausea Only  . Hydrochlorothiazide Other (See Comments)  . Lisinopril Cough  . Sulfa Antibiotics Other (See Comments)    Current Outpatient Prescriptions  Medication Sig Dispense Refill  . anastrozole (ARIMIDEX) 1 MG tablet Take 1 tablet (1 mg total) by mouth daily. 90 tablet 1  . Biotin 10 MG CAPS Take by mouth.    . Calcium Carb-Cholecalciferol (CALCIUM + D3) 600-200 MG-UNIT TABS Take by mouth daily.     . Cholecalciferol (VITAMIN D3) 2000 UNITS TABS Take 1 tablet by mouth daily.    .  Fiber CHEW Chew by mouth as needed.    . fish oil-omega-3 fatty acids 1000 MG capsule Take 2 g by mouth as needed.     . furosemide (LASIX) 20 MG tablet TAKE 1 TABLET(20 MG) BY MOUTH DAILY AS NEEDED FOR FLUID RETENTION 90 tablet 0  . ibuprofen (ADVIL,MOTRIN) 200 MG tablet Take 200 mg by mouth every 6 (six) hours as needed. Reported on 10/22/2015    . inFLIXimab (REMICADE) 100 MG injection Inject 1 mg into the vein every 8 (eight) weeks.    Marland Kitchen LORazepam (ATIVAN) 0.5 MG tablet Take 1 tablet (0.5 mg total) by mouth 2 (two) times daily. As needed for anxiety 60 tablet 5  . losartan-hydrochlorothiazide (HYZAAR) 50-12.5 MG tablet TAKE 1 TABLET BY MOUTH DAILY 90 tablet 0  . Multiple Vitamin (MULTIVITAMIN) tablet Take 1 tablet by mouth daily.    Marland Kitchen nystatin (MYCOSTATIN) 100000 UNIT/ML suspension Take 5 mLs (500,000 Units total) by mouth 4 (four) times daily. 120 mL 0  . polyethylene glycol (MIRALAX / GLYCOLAX) packet Take 17 g by mouth daily as needed.     . Tdap (BOOSTRIX) 5-2.5-18.5 LF-MCG/0.5 injection Inject 0.5 mLs into the muscle once. 0.5 mL 0   No current facility-administered medications for this visit.    Review of Systems Review of Systems  Constitutional: Negative.  Respiratory: Negative.   Cardiovascular: Negative.     Blood pressure 162/80, pulse 88, resp. rate 16, height 5' 2"  (1.575 m), weight 134 lb (60.782 kg).  Physical Exam Physical Exam  Constitutional: She is oriented to person, place, and time. She appears well-developed and well-nourished.  Eyes: Conjunctivae are normal. No scleral icterus.  Neck: Neck supple.  Cardiovascular: Normal rate and regular rhythm.   Murmur heard.  Systolic murmur is present with a grade of 2/6  Pulmonary/Chest: Effort normal and breath sounds normal. Right breast exhibits no inverted nipple, no mass, no nipple discharge, no skin change and no tenderness. Left breast exhibits no inverted nipple, no mass, no nipple discharge, no skin change and  no tenderness.    Well healed incision from 10 to 1 right breast   Abdominal: Soft. Bowel sounds are normal. There is no tenderness.  Lymphadenopathy:    She has no cervical adenopathy.    She has no axillary adenopathy.  Neurological: She is alert and oriented to person, place, and time.  Skin: Skin is warm and dry.    Data Reviewed Bilateral mammograms dated 10/01/2015 were reviewed from [facility]. Postsurgical changes. No interval change. BI-RADS-2.   Assessment    Benign breast exam.  Tolerating Arimidex well.  Bone density earlier this year with a T score of -1. Stable by report.    Plan    Follow-up examination in one year with bilateral mammogram at that time.    The patient has been asked to return to the office in one year with a bilateral diagnostic mammogram. PCP:  [name] This information has been scribed by Gaspar Cola CMA.   [name] 10/22/2015, 11:21 AM

## 2015-10-22 NOTE — Patient Instructions (Addendum)
The patient has been asked to return to the office in one year with a bilateral diagnostic mammogram.Continue self breast exams. Call office for any new breast issues or concerns.

## 2015-10-24 ENCOUNTER — Encounter: Payer: Self-pay | Admitting: Internal Medicine

## 2015-11-07 ENCOUNTER — Ambulatory Visit: Payer: Medicare Other | Admitting: Internal Medicine

## 2015-11-13 DIAGNOSIS — K5000 Crohn's disease of small intestine without complications: Secondary | ICD-10-CM | POA: Diagnosis not present

## 2015-11-14 ENCOUNTER — Other Ambulatory Visit: Payer: Self-pay | Admitting: Internal Medicine

## 2015-11-14 MED ORDER — LORAZEPAM 0.5 MG PO TABS: 0.5000 mg | ORAL_TABLET | Freq: Two times a day (BID) | ORAL | Status: DC

## 2015-11-14 NOTE — Telephone Encounter (Signed)
Pt would like to have her LORazepam (ATIVAN) 0.5 MG tablet refilled. She has changed pharmacies. New pharmacy is [facility] on [address]. [employer]

## 2015-11-14 NOTE — Telephone Encounter (Signed)
You can refill by phone if she is going to run out over the weekend

## 2015-11-14 NOTE — Telephone Encounter (Signed)
Please advise for refill, last OV was 10/02/2015, refilled last in January with 5 refills. Please advise, thanks

## 2015-11-15 NOTE — Telephone Encounter (Signed)
Spoke with patient can be sent on Monday, thanks

## 2015-11-26 ENCOUNTER — Ambulatory Visit (INDEPENDENT_AMBULATORY_CARE_PROVIDER_SITE_OTHER): Payer: Medicare Other | Admitting: Internal Medicine

## 2015-11-26 ENCOUNTER — Encounter: Payer: Self-pay | Admitting: Internal Medicine

## 2015-11-26 DIAGNOSIS — R7301 Impaired fasting glucose: Secondary | ICD-10-CM | POA: Diagnosis not present

## 2015-11-26 DIAGNOSIS — F411 Generalized anxiety disorder: Secondary | ICD-10-CM | POA: Diagnosis not present

## 2015-11-26 DIAGNOSIS — K5900 Constipation, unspecified: Secondary | ICD-10-CM | POA: Diagnosis not present

## 2015-11-26 DIAGNOSIS — I10 Essential (primary) hypertension: Secondary | ICD-10-CM

## 2015-11-26 MED ORDER — LOSARTAN POTASSIUM 25 MG PO TABS: 25.0000 mg | ORAL_TABLET | Freq: Every day | ORAL | 1 refills | Status: DC

## 2015-11-26 NOTE — Patient Instructions (Addendum)
We are changing losartan hct to just losartan in the evening 25 mg .  full tablet   You can use the furosemide every other day maxumum for fluid retention     Try to start the lexapro starting with 1/2 tablet in the evening with dinner   Gradually reduce your use of lorazepam to once daily if needed     For you constipation:   You can use Miralax, Benefiber,  Citrucel or  Fiber con every day along with a stool softener (docusate) 200 mg daily     I'll see you in August.  You do not need labs until September

## 2015-11-26 NOTE — Progress Notes (Signed)
Subjective:  Patient ID: Kim Sanchez, female    DOB: [birth year]  Age: 75 y.o. MRN: [id number]  CC: Diagnoses of Impaired fasting blood sugar, Essential hypertension, Generalized anxiety disorder, and Constipation, unspecified constipation type were pertinent to this visit.  HPI Kim Sanchez presents for follow up on hypertension , GAD, and prediabetes  Did not tolerate full dose of losartan/hct due to nausea.  Taking 1/2 tab at bedtime, still getting nauseated .  Home readings have been improved.   Did not tolerate trial of  lexapro either,  But  still using lorazepam up to twice daily   Daily constipation despite using  miralax"Smooth Move"   Lab Results  Component Value Date   HGBA1C 6.3 10/02/2015     Outpatient Medications Prior to Visit  Medication Sig Dispense Refill  . anastrozole (ARIMIDEX) 1 MG tablet Take 1 tablet (1 mg total) by mouth daily. 90 tablet 1  . Biotin 10 MG CAPS Take by mouth.    . Cholecalciferol (VITAMIN D3) 2000 UNITS TABS Take 1 tablet by mouth daily.    . Fiber CHEW Chew by mouth as needed.    . fish oil-omega-3 fatty acids 1000 MG capsule Take 2 g by mouth as needed.     Marland Kitchen ibuprofen (ADVIL,MOTRIN) 200 MG tablet Take 200 mg by mouth every 6 (six) hours as needed. Reported on 10/22/2015    . inFLIXimab (REMICADE) 100 MG injection Inject 1 mg into the vein every 8 (eight) weeks.    Marland Kitchen LORazepam (ATIVAN) 0.5 MG tablet Take 1 tablet (0.5 mg total) by mouth 2 (two) times daily. As needed for anxiety 60 tablet 5  . Multiple Vitamin (MULTIVITAMIN) tablet Take 1 tablet by mouth daily.    Marland Kitchen nystatin (MYCOSTATIN) 100000 UNIT/ML suspension Take 5 mLs (500,000 Units total) by mouth 4 (four) times daily. 120 mL 0  . polyethylene glycol (MIRALAX / GLYCOLAX) packet Take 17 g by mouth daily as needed.     Marland Kitchen losartan-hydrochlorothiazide (HYZAAR) 50-12.5 MG tablet TAKE 1 TABLET BY MOUTH DAILY (Patient taking differently: TAKE 1/2 TABLET BY MOUTH DAILY) 90 tablet 0  .  Calcium Carb-Cholecalciferol (CALCIUM + D3) 600-200 MG-UNIT TABS Take by mouth daily.     . furosemide (LASIX) 20 MG tablet TAKE 1 TABLET(20 MG) BY MOUTH DAILY AS NEEDED FOR FLUID RETENTION (Patient not taking: Reported on 11/26/2015) 90 tablet 0  . Tdap (BOOSTRIX) 5-2.5-18.5 LF-MCG/0.5 injection Inject 0.5 mLs into the muscle once. (Patient not taking: Reported on 11/26/2015) 0.5 mL 0   No facility-administered medications prior to visit.     Review of Systems;  Patient denies headache, fevers, malaise, unintentional weight loss, skin rash, eye pain, sinus congestion and sinus pain, sore throat, dysphagia,  hemoptysis , cough, dyspnea, wheezing, chest pain, palpitations, orthopnea, edema, abdominal pain, nausea, melena, diarrhea, constipation, flank pain, dysuria, hematuria, urinary  Frequency, nocturia, numbness, tingling, seizures,  Focal weakness, Loss of consciousness,  Tremor, insomnia, depression, anxiety, and suicidal ideation.      Objective:  BP 138/88 (BP Location: Left Arm, Patient Position: Sitting, Cuff Size: Normal)   Pulse 72   Temp 98.5 F (36.9 C) (Oral)   Resp 16   Wt 136 lb (61.7 kg)   BMI 24.87 kg/m   BP Readings from Last 3 Encounters:  11/26/15 138/88  10/22/15 (!) 162/80  10/02/15 (!) 158/88    Wt Readings from Last 3 Encounters:  11/26/15 136 lb (61.7 kg)  10/22/15 134 lb (60.8 kg)  10/02/15 135 lb (61.2 kg)    General appearance: alert, cooperative and appears stated age Ears: normal TM's and external ear canals both ears Throat: lips, mucosa, and tongue normal; teeth and gums normal Neck: no adenopathy, no carotid bruit, supple, symmetrical, trachea midline and thyroid not enlarged, symmetric, no tenderness/mass/nodules Back: symmetric, no curvature. ROM normal. No CVA tenderness. Lungs: clear to auscultation bilaterally Heart: regular rate and rhythm, S1, S2 normal, no murmur, click, rub or gallop Abdomen: soft, non-tender; bowel sounds normal; no  masses,  no organomegaly Pulses: 2+ and symmetric Skin: Skin color, texture, turgor normal. No rashes or lesions Lymph nodes: Cervical, supraclavicular, and axillary nodes normal.  Lab Results  Component Value Date   HGBA1C 6.3 10/02/2015   HGBA1C 6.4 03/15/2015   HGBA1C 6.1 08/30/2014    Lab Results  Component Value Date   CREATININE 0.66 10/02/2015   CREATININE 0.67 03/15/2015   CREATININE 0.67 08/30/2014    Lab Results  Component Value Date   WBC 7.1 08/30/2014   HGB 13.4 08/30/2014   HCT 39.1 08/30/2014   PLT 245.0 08/30/2014   GLUCOSE 108 (H) 10/02/2015   CHOL 255 (H) 08/30/2014   TRIG 96.0 08/30/2014   HDL 79.20 08/30/2014   LDLDIRECT 151.5 10/03/2013   LDLCALC 157 (H) 08/30/2014   ALT 14 10/02/2015   AST 18 10/02/2015   NA 139 10/02/2015   K 3.9 10/02/2015   CL 103 10/02/2015   CREATININE 0.66 10/02/2015   BUN 18 10/02/2015   CO2 30 10/02/2015   TSH 2.08 08/30/2014   HGBA1C 6.3 10/02/2015   MICROALBUR 1.3 10/02/2015    No results found.  Assessment & Plan:   Problem List Items Addressed This Visit    Impaired fasting blood sugar (Chronic)    Her A1c is elevated , suggesting she has diet controlled DM.  She is not overweight and has a careful diet.  Will repeat in 6 months.  She has a documented intolerance to lisinopril and no proteinuria by Dec 2015 assessment.    Lab Results  Component Value Date   HGBA1C 6.3 10/02/2015   Lab Results  Component Value Date   MICROALBUR 1.3 10/02/2015             Hypertension    She is not tolerating losartan hct due to persistent nausea. Changing medication to losartan 25 mg daily.       Relevant Medications   losartan (COZAAR) 25 MG tablet   Constipation    Recommended an increase in fiber intake to 25 to 35 grams daily.  Made several dietary suggestions of high fiber content including [place] low carb whole wheat tortillas which have 26 g fiber/serving, [place] flatbread which has around 10 g  fiber,   Daily serving of any type of nuts,  and Atkins protein bars which have 8 to 10 g fiber.   Dispelled the popularly held notion that [employer] oatmeal, whole grain bread and most commericially made cereals have adequate fiber.  Finally I recommended daily use of Miralax., metamucil, citrucel, benefiber  or fibercon.       Generalized anxiety disorder    Discussed her need to accept ssri therapy and avoid increasing use of lorazepam .         Other Visit Diagnoses   None.     I have discontinued Ms. Girton's losartan-hydrochlorothiazide. I am also having her start on losartan. Additionally, I am having her maintain her polyethylene glycol, ibuprofen, Calcium + D3, multivitamin,  Biotin, fish oil-omega-3 fatty acids, Fiber, inFLIXimab, Vitamin D3, nystatin, Tdap, furosemide, anastrozole, and LORazepam.  Meds ordered this encounter  Medications  . losartan (COZAAR) 25 MG tablet    Sig: Take 1 tablet (25 mg total) by mouth daily.    Dispense:  30 tablet    Refill:  1    Medications Discontinued During This Encounter  Medication Reason  . losartan-hydrochlorothiazide (HYZAAR) 50-12.5 MG tablet    A total of 25 minutes of face to face time was spent with patient more than half of which was spent in counselling about the above mentioned conditions  and coordination of care  Follow-up: No Follow-up on file.   [name], MD

## 2015-11-28 MED ORDER — ESCITALOPRAM OXALATE 5 MG PO TABS: 5.0000 mg | ORAL_TABLET | Freq: Every day | ORAL | 5 refills | Status: DC

## 2015-11-28 NOTE — Assessment & Plan Note (Signed)
Her A1c is elevated , suggesting she has diet controlled DM.  She is not overweight and has a careful diet.  Will repeat in 6 months.  She has a documented intolerance to lisinopril and no proteinuria by Dec 2015 assessment.    Lab Results  Component Value Date   HGBA1C 6.3 10/02/2015   Lab Results  Component Value Date   MICROALBUR 1.3 10/02/2015

## 2015-11-28 NOTE — Assessment & Plan Note (Addendum)
She is not tolerating losartan hct due to persistent nausea. Changing medication to losartan 25 mg daily.

## 2015-11-28 NOTE — Assessment & Plan Note (Signed)
Discussed her need to accept ssri therapy and avoid increasing use of lorazepam .

## 2015-11-28 NOTE — Assessment & Plan Note (Signed)
Recommended an increase in fiber intake to 25 to 35 grams daily.  Made several dietary suggestions of high fiber content including [place] low carb whole wheat tortillas which have 26 g fiber/serving, [place] flatbread which has around 10 g fiber,   Daily serving of any type of nuts,  and Atkins protein bars which have 8 to 10 g fiber.   Dispelled the popularly held notion that [employer] oatmeal, whole grain bread and most commericially made cereals have adequate fiber.  Finally I recommended daily use of Miralax., metamucil, citrucel, benefiber  or fibercon.

## 2015-11-29 ENCOUNTER — Telehealth: Payer: Self-pay | Admitting: *Deleted

## 2015-11-29 NOTE — Telephone Encounter (Signed)
Patient called and states she is having a issues with her Lexapro. She states it is making  Her nauseous , sick on her stomach. She states she started it today. She took half of 94m as Dr.Tullo suggested. Pt states she doesn't want to take the medication anymore. She said she will continue to take the lorazepam  Pt is requesting a call back. Her number is  [phone] Thanks

## 2015-11-29 NOTE — Telephone Encounter (Signed)
That's fine,  She can continue the lorazepam. Not more than twice daily

## 2015-11-29 NOTE — Telephone Encounter (Signed)
Notified pt that Dr Derrel Nip is not in the office today but I will notify Dr Derrel Nip of concerns, Advised pt to not take the Lexapro if it makes her feel bad and we will discuss with Dr Derrel Nip and get back with her on Monday.

## 2015-12-03 NOTE — Telephone Encounter (Signed)
Pt is returning office phone call about her medication.

## 2015-12-03 NOTE — Telephone Encounter (Signed)
Spoke with patient, verbalized understanding and will take the Lorazepam twice a day max, thanks

## 2015-12-30 ENCOUNTER — Ambulatory Visit (INDEPENDENT_AMBULATORY_CARE_PROVIDER_SITE_OTHER): Payer: Medicare Other | Admitting: Internal Medicine

## 2015-12-30 ENCOUNTER — Encounter: Payer: Self-pay | Admitting: Internal Medicine

## 2015-12-30 DIAGNOSIS — R300 Dysuria: Secondary | ICD-10-CM | POA: Diagnosis not present

## 2015-12-30 DIAGNOSIS — I10 Essential (primary) hypertension: Secondary | ICD-10-CM

## 2015-12-30 DIAGNOSIS — K5900 Constipation, unspecified: Secondary | ICD-10-CM

## 2015-12-30 DIAGNOSIS — N760 Acute vaginitis: Secondary | ICD-10-CM | POA: Diagnosis not present

## 2015-12-30 LAB — POCT URINALYSIS DIPSTICK
BILIRUBIN UA: NEGATIVE
Blood, UA: 10
Glucose, UA: NEGATIVE
KETONES UA: NEGATIVE
Leukocytes, UA: NEGATIVE
Nitrite, UA: NEGATIVE
Urobilinogen, UA: 0.2
pH, UA: 6

## 2015-12-30 LAB — URINALYSIS, ROUTINE W REFLEX MICROSCOPIC
BILIRUBIN URINE: NEGATIVE
Ketones, ur: NEGATIVE
NITRITE: NEGATIVE
Specific Gravity, Urine: 1.015 (ref 1.000–1.030)
Urine Glucose: NEGATIVE
Urobilinogen, UA: 0.2 (ref 0.0–1.0)
pH: 5.5 (ref 5.0–8.0)

## 2015-12-30 MED ORDER — CLOBETASOL PROP EMOLLIENT BASE 0.05 % EX CREA: TOPICAL_CREAM | CUTANEOUS | 2 refills | Status: DC

## 2015-12-30 NOTE — Assessment & Plan Note (Signed)
Persistent despite use of colace and miralax daily.  Advised to use MOM/cathartic prn every 3 days max

## 2015-12-30 NOTE — Assessment & Plan Note (Signed)
She is intolerant of many medications .  The last medication that was started has been divided into tow doses daily per patient preference,  As she feels "bad" swen her systolic drops below 431.

## 2015-12-30 NOTE — Progress Notes (Signed)
Subjective:  Patient ID: Kim Sanchez, female    DOB: 1940/06/01  Age: 75 y.o. MRN: [id number]  CC: Diagnoses of Dysuria, Vaginitis and vulvovaginitis, Essential hypertension, and Constipation, unspecified constipation type were pertinent to this visit.  HPI Kim Sanchez presents for evaluation of dysuria Symptoms have been present for two weeks and including vaginal burning, pain at the end of urination accompanied by itching.  Uses a daily sanitary pad for management of occasinal urinary stress incontinence an dnotes that the current pad is made form recylced paper and has a strange odor to it  Also having Left inguinal pain that occurs on and off,  And low back and suprapubic pain on and off  Not sexually active.  Has chronic constipation , only moves bowels a small amouNT every other day uses colace and miralax daily  3-4 16 oz bottles of water daily     Outpatient Medications Prior to Visit  Medication Sig Dispense Refill  . anastrozole (ARIMIDEX) 1 MG tablet Take 1 tablet (1 mg total) by mouth daily. 90 tablet 1  . Biotin 10 MG CAPS Take by mouth.    . Calcium Carb-Cholecalciferol (CALCIUM + D3) 600-200 MG-UNIT TABS Take by mouth daily.     . Cholecalciferol (VITAMIN D3) 2000 UNITS TABS Take 1 tablet by mouth daily.    . Fiber CHEW Chew by mouth as needed.    . fish oil-omega-3 fatty acids 1000 MG capsule Take 2 g by mouth as needed.     . furosemide (LASIX) 20 MG tablet TAKE 1 TABLET(20 MG) BY MOUTH DAILY AS NEEDED FOR FLUID RETENTION 90 tablet 0  . ibuprofen (ADVIL,MOTRIN) 200 MG tablet Take 200 mg by mouth every 6 (six) hours as needed. Reported on 10/22/2015    . inFLIXimab (REMICADE) 100 MG injection Inject 1 mg into the vein every 8 (eight) weeks.    Marland Kitchen LORazepam (ATIVAN) 0.5 MG tablet Take 1 tablet (0.5 mg total) by mouth 2 (two) times daily. As needed for anxiety 60 tablet 5  . losartan (COZAAR) 25 MG tablet Take 1 tablet (25 mg total) by mouth daily. 30 tablet 1  . Multiple  Vitamin (MULTIVITAMIN) tablet Take 1 tablet by mouth daily.    Marland Kitchen nystatin (MYCOSTATIN) 100000 UNIT/ML suspension Take 5 mLs (500,000 Units total) by mouth 4 (four) times daily. 120 mL 0  . polyethylene glycol (MIRALAX / GLYCOLAX) packet Take 17 g by mouth daily as needed.     . Tdap (BOOSTRIX) 5-2.5-18.5 LF-MCG/0.5 injection Inject 0.5 mLs into the muscle once. 0.5 mL 0  . escitalopram (LEXAPRO) 5 MG tablet Take 1 tablet (5 mg total) by mouth daily. (Patient not taking: Reported on 12/30/2015) 30 tablet 5   No facility-administered medications prior to visit.     Review of Systems;  Patient denies headache, fevers, malaise, unintentional weight loss, skin rash, eye pain, sinus congestion and sinus pain, sore throat, dysphagia,  hemoptysis , cough, dyspnea, wheezing, chest pain, palpitations, orthopnea, edema, abdominal pain, nausea, melena, diarrhea, constipation, flank pain, dysuria, hematuria, urinary  Frequency, nocturia, numbness, tingling, seizures,  Focal weakness, Loss of consciousness,  Tremor, insomnia, depression, anxiety, and suicidal ideation.      Objective:  BP (!) 150/88   Pulse 80   Temp 98.5 F (36.9 C) (Oral)   Resp 12   Ht 5' 2"  (1.575 m)   Wt 138 lb 12 oz (62.9 kg)   SpO2 98%   BMI 25.38 kg/m   BP Readings  from Last 3 Encounters:  12/30/15 (!) 150/88  11/26/15 138/88  10/22/15 (!) 162/80    Wt Readings from Last 3 Encounters:  12/30/15 138 lb 12 oz (62.9 kg)  11/26/15 136 lb (61.7 kg)  10/22/15 134 lb (60.8 kg)    General appearance: alert, cooperative and appears stated age Ears: normal TM's and external ear canals both ears Throat: lips, mucosa, and tongue normal; teeth and gums normal Neck: no adenopathy, no carotid bruit, supple, symmetrical, trachea midline and thyroid not enlarged, symmetric, no tenderness/mass/nodules Back: symmetric, no curvature. ROM normal. No CVA tenderness. Lungs: clear to auscultation bilaterally Heart: regular rate and  rhythm, S1, S2 normal, no murmur, click, rub or gallop Abdomen: soft, non-tender; bowel sounds normal; no masses,  no organomegaly GYN: erythematous left labia minora crease, atrophic vagina, mild erythema of urethra  Pulses: 2+ and symmetric Skin: Skin color, texture, turgor normal. No rashes or lesions Lymph nodes: Cervical, supraclavicular, and axillary nodes normal.  Lab Results  Component Value Date   HGBA1C 6.3 10/02/2015   HGBA1C 6.4 03/15/2015   HGBA1C 6.1 08/30/2014    Lab Results  Component Value Date   CREATININE 0.66 10/02/2015   CREATININE 0.67 03/15/2015   CREATININE 0.67 08/30/2014    Lab Results  Component Value Date   WBC 7.1 08/30/2014   HGB 13.4 08/30/2014   HCT 39.1 08/30/2014   PLT 245.0 08/30/2014   GLUCOSE 108 (H) 10/02/2015   CHOL 255 (H) 08/30/2014   TRIG 96.0 08/30/2014   HDL 79.20 08/30/2014   LDLDIRECT 151.5 10/03/2013   LDLCALC 157 (H) 08/30/2014   ALT 14 10/02/2015   AST 18 10/02/2015   NA 139 10/02/2015   K 3.9 10/02/2015   CL 103 10/02/2015   CREATININE 0.66 10/02/2015   BUN 18 10/02/2015   CO2 30 10/02/2015   TSH 2.08 08/30/2014   HGBA1C 6.3 10/02/2015   MICROALBUR 1.3 10/02/2015    No results found.  Assessment & Plan:   Problem List Items Addressed This Visit    Hypertension    She is intolerant of many medications .  The last medication that was started has been divided into tow doses daily per patient preference,  As she feels "bad" swen her systolic drops below 546.       Constipation    Persistent despite use of colace and miralax daily.  Advised to use MOM/cathartic prn every 3 days max      Vaginitis and vulvovaginitis    She has menopausal induced atrophic vaginitis,  And her symptoms have been ggravated by use of a new product , a urinary incontience pad made from recycled material.  Advised to stop using the offending pad and apply clobetasol to irritated areas bid.  Estrogen cream is relatively C/I given history  of breast  Cancer.       Dysuria    With POCT UA noting blood only.  Physical exam suggestive of vaginitis noninfectious,  UA and culture pending       Relevant Orders   Urinalysis, Routine w reflex microscopic (Completed)   POCT Urinalysis Dipstick (Completed)   Urine Culture    Other Visit Diagnoses   None.     A total of 25 minutes of face to face time was spent with patient more than half of which was spent in counselling about the above mentioned conditions  and coordination of care   I am having Ms. Hsia start on Clobetasol Prop Emollient Base. I am also  having her maintain her polyethylene glycol, ibuprofen, Calcium + D3, multivitamin, Biotin, fish oil-omega-3 fatty acids, Fiber, inFLIXimab, Vitamin D3, nystatin, Tdap, furosemide, anastrozole, LORazepam, losartan, and escitalopram.  Meds ordered this encounter  Medications  . Clobetasol Prop Emollient Base (CLOBETASOL PROPIONATE E) 0.05 % emollient cream    Sig: Apply to irritated areas twice daily until itching has resolved    Dispense:  30 g    Refill:  2    There are no discontinued medications.  Follow-up: Return in about 3 months (around 03/31/2016), or WELLNESS/cpe.   [name], MD

## 2015-12-30 NOTE — Assessment & Plan Note (Signed)
With POCT UA noting blood only.  Physical exam suggestive of vaginitis noninfectious,  UA and culture pending

## 2015-12-30 NOTE — Progress Notes (Signed)
Pre-visit discussion using our clinic review tool. No additional management support is needed unless otherwise documented below in the visit note.  

## 2015-12-30 NOTE — Assessment & Plan Note (Addendum)
She has menopausal induced atrophic vaginitis,  And her symptoms have been ggravated by use of a new product , a urinary incontience pad made from recycled material.  Advised to stop using the offending pad and apply clobetasol to irritated areas bid.  Estrogen cream is relatively C/I given history of breast  Cancer.

## 2015-12-30 NOTE — Patient Instructions (Addendum)
I AM TREATING YOUR FOR VAGINITIS (IRRITATION) CAUSED BY ATROPHY AND THE NEW PAD YOU ARE WEARING  YOU CAN USE THE STEROID CREAM TWICE DAILY ON IRRITATED AREAS UNTIL SYMPTOMS RESOLVE. YOU CAN ALSO USE A& D OINTMENT,  AVAILABLE OVER THE COUNTER.  TRY ADDING MILD OF MAGNESIUM EVERY 3 DAYS TO MOVE YOUR BOWELS MORE COMPLETELY      HERE ARE THE LOW CARB  BREAD CHOICES  THAT ARE VERY HIGH FIBER.  THE LAST ONE (MISSION) HAS 26 G RAMS!!

## 2016-01-01 ENCOUNTER — Encounter: Payer: Self-pay | Admitting: Internal Medicine

## 2016-01-01 ENCOUNTER — Other Ambulatory Visit: Payer: Self-pay | Admitting: Internal Medicine

## 2016-01-01 LAB — URINE CULTURE

## 2016-01-01 MED ORDER — AMOXICILLIN-POT CLAVULANATE 875-125 MG PO TABS: 1.0000 | ORAL_TABLET | Freq: Two times a day (BID) | ORAL | 0 refills | Status: DC

## 2016-01-01 NOTE — Telephone Encounter (Signed)
Pt sent a message and wanted to know if Dr Derrel Nip received it. I did explain to pt that Dr Derrel Nip gets her messages in between pts.

## 2016-01-15 ENCOUNTER — Telehealth: Payer: Self-pay | Admitting: Internal Medicine

## 2016-01-15 ENCOUNTER — Encounter: Payer: Self-pay | Admitting: Family

## 2016-01-15 ENCOUNTER — Ambulatory Visit (INDEPENDENT_AMBULATORY_CARE_PROVIDER_SITE_OTHER): Payer: Medicare Other | Admitting: Family

## 2016-01-15 VITALS — BP 152/82 | HR 83 | Temp 98.7°F | Ht 62.0 in | Wt 137.4 lb

## 2016-01-15 DIAGNOSIS — I10 Essential (primary) hypertension: Secondary | ICD-10-CM | POA: Diagnosis not present

## 2016-01-15 DIAGNOSIS — R300 Dysuria: Secondary | ICD-10-CM | POA: Diagnosis not present

## 2016-01-15 LAB — COMPREHENSIVE METABOLIC PANEL
ALK PHOS: 83 U/L (ref 39–117)
ALT: 11 U/L (ref 0–35)
AST: 16 U/L (ref 0–37)
Albumin: 4.2 g/dL (ref 3.5–5.2)
BILIRUBIN TOTAL: 0.8 mg/dL (ref 0.2–1.2)
BUN: 10 mg/dL (ref 6–23)
CO2: 31 meq/L (ref 19–32)
Calcium: 9.3 mg/dL (ref 8.4–10.5)
Chloride: 103 mEq/L (ref 96–112)
Creatinine, Ser: 0.66 mg/dL (ref 0.40–1.20)
GFR: 92.86 mL/min (ref >60.00)
GLUCOSE: 114 mg/dL — AB (ref 70–99)
Potassium: 3.8 mEq/L (ref 3.5–5.1)
SODIUM: 141 meq/L (ref 135–145)
TOTAL PROTEIN: 7.7 g/dL (ref 6.0–8.3)

## 2016-01-15 LAB — POCT URINALYSIS DIPSTICK
BILIRUBIN UA: NEGATIVE
GLUCOSE UA: NEGATIVE
Ketones, UA: NEGATIVE
Nitrite, UA: NEGATIVE
PROTEIN UA: NEGATIVE
Spec Grav, UA: 1.01
Urobilinogen, UA: 0.2
pH, UA: 6.5

## 2016-01-15 MED ORDER — LOSARTAN POTASSIUM 50 MG PO TABS: 50.0000 mg | ORAL_TABLET | Freq: Every day | ORAL | 3 refills | Status: DC

## 2016-01-15 NOTE — Patient Instructions (Addendum)
Please take Cozaar 57m every day.  Please call [facility] readings and bring your BP cuff at follow up.   Managing Your High Blood Pressure Blood pressure is a measurement of how forceful your blood is pressing against the walls of the arteries. Arteries are muscular tubes within the circulatory system. Blood pressure does not stay the same. Blood pressure rises when you are active, excited, or nervous; and it lowers during sleep and relaxation. If the numbers measuring your blood pressure stay above normal most of the time, you are at risk for health problems. High blood pressure (hypertension) is a long-term (chronic) condition in which blood pressure is elevated. A blood pressure reading is recorded as two numbers, such as 120 over 80 (or 120/80). The first, higher number is called the systolic pressure. It is a measure of the pressure in your arteries as the heart beats. The second, lower number is called the diastolic pressure. It is a measure of the pressure in your arteries as the heart relaxes between beats.  Keeping your blood pressure in a normal range is important to your overall health and prevention of health problems, such as heart disease and stroke. When your blood pressure is uncontrolled, your heart has to work harder than normal. High blood pressure is a very common condition in adults because blood pressure tends to rise with age. Men and women are equally likely to have hypertension but at different times in life. Before age 75 men are more likely to have hypertension. After 75years of age, women are more likely to have it. Hypertension is especially common in African Americans. This condition often has no signs or symptoms. The cause of the condition is usually not known. Your caregiver can help you come up with a plan to keep your blood pressure in a normal, healthy range. BLOOD PRESSURE STAGES Blood pressure is classified into four stages: normal, prehypertension, stage 1, and stage  2. Your blood pressure reading will be used to determine what type of treatment, if any, is necessary. Appropriate treatment options are tied to these four stages:  Normal  Systolic pressure (mm Hg): below 120.  Diastolic pressure (mm Hg): below 80. Prehypertension  Systolic pressure (mm Hg): 120 to 139.  Diastolic pressure (mm Hg): 80 to 89. Stage1  Systolic pressure (mm Hg): 140 to 159.  Diastolic pressure (mm Hg): 90 to 99. Stage2  Systolic pressure (mm Hg): 160 or above.  Diastolic pressure (mm Hg): 100 or above. RISKS RELATED TO HIGH BLOOD PRESSURE Managing your blood pressure is an important responsibility. Uncontrolled high blood pressure can lead to:  A heart attack.  A stroke.  A weakened blood vessel (aneurysm).  Heart failure.  Kidney damage.  Eye damage.  Metabolic syndrome.  Memory and concentration problems. HOW TO MANAGE YOUR BLOOD PRESSURE Blood pressure can be managed effectively with lifestyle changes and medicines (if needed). Your caregiver will help you come up with a plan to bring your blood pressure within a normal range. Your plan should include the following: Education  Read all information provided by your caregivers about how to control blood pressure.  Educate yourself on the latest guidelines and treatment recommendations. New research is always being done to further define the risks and treatments for high blood pressure. Lifestylechanges  Control your weight.  Avoid smoking.  Stay physically active.  Reduce the amount of salt in your diet.  Reduce stress.  Control any chronic conditions, such as high cholesterol or diabetes.  Reduce your alcohol intake. Medicines  Several medicines (antihypertensive medicines) are available, if needed, to bring blood pressure within a normal range. Communication  Review all the medicines you take with your caregiver because there may be side effects or interactions.  Talk with your  caregiver about your diet, exercise habits, and other lifestyle factors that may be contributing to high blood pressure.  See your caregiver regularly. Your caregiver can help you create and adjust your plan for managing high blood pressure. RECOMMENDATIONS FOR TREATMENT AND FOLLOW-UP  The following recommendations are based on current guidelines for managing high blood pressure in nonpregnant adults. Use these recommendations to identify the proper follow-up period or treatment option based on your blood pressure reading. You can discuss these options with your caregiver.  Systolic pressure of 300 to 923 or diastolic pressure of 80 to 89: Follow up with your caregiver as directed.  Systolic pressure of 300 to 762 or diastolic pressure of 90 to 100: Follow up with your caregiver within 2 months.  Systolic pressure above 263 or diastolic pressure above 335: Follow up with your caregiver within 1 month.  Systolic pressure above 456 or diastolic pressure above 256: Consider antihypertensive therapy; follow up with your caregiver within 1 week.  Systolic pressure above 389 or diastolic pressure above 373: Begin antihypertensive therapy; follow up with your caregiver within 1 week.   This information is not intended to replace advice given to you by your health care provider. Make sure you discuss any questions you have with your health care provider.   Document Released: 01/13/2012 Document Reviewed: 01/13/2012 Elsevier Interactive Patient Education [employer].

## 2016-01-15 NOTE — Telephone Encounter (Signed)
Pt called about her BP back up to 175/91. Pt stated she took the medication that was prescribed today. Thank you!

## 2016-01-15 NOTE — Assessment & Plan Note (Addendum)
Uncontrolled.Blood pressure 152/82 today. No signs of hypertensive urgency or emergency today. Patient and I jointly agreed to increase losartan to 50 mg and follow her blood pressure readings closely. Patient will call us with a blood pressure reading and also make a follow-up BP appointment. Pending BMP.

## 2016-01-15 NOTE — Telephone Encounter (Signed)
Patient stated she only took 25m.  Patient stated she will go pick up new medication at the pharmacy you placed the order today during appointment.

## 2016-01-15 NOTE — Assessment & Plan Note (Addendum)
Patient continues to have mild symptoms. UA trace leukocytes, will await urine culture as patient was patient treated with bacteria sensitive abx 2 weeks ago. Advised patient to start clobetasol cream twice a day for atrophic vaginitis since symptom has persisted after UTI was treated. Patient politely declined another pelvic exam today as this was done at her last visit.

## 2016-01-15 NOTE — Progress Notes (Signed)
Pre visit review using our clinic review tool, if applicable. No additional management support is needed unless otherwise documented below in the visit note. 

## 2016-01-15 NOTE — Progress Notes (Signed)
Subjective:    Patient ID: Kim Sanchez, female    DOB: Feb 09, 1941, 75 y.o.   MRN: [id number]  CC: Kim Sanchez is a 75 y.o. female who presents today for an acute visit.    HPI: She is here today for acute visit with complaint of BP high yesterday.   BPs at home are 180/98 after doing BP twice left arm. Endorses slight HA today on left side ( resolves with ibuprofen), jittery. Didn't bring BP cuff in today. Takes half of 1m cozaar at night and the other half at bedtime.   Denies exertional chest pain or pressure, numbness or tingling radiating to left arm or jaw, palpitations, dizziness, frequent headaches, changes in vision, jaw pain, confusion or shortness of breath.   Stopped Lexapro couple weeks ago after taking one time because she was nauseated.   Patient requests her urine to be rechecked after treated for urinary tract infection 2 weeks ago. She notes she still having "some mild stinging sensation". She's not certain of the cream that as prescribed by her PCP and she will do so today.   Per chart review, the patient was last seen by PCP 3 weeks ago, BP 150/88 and PCP noted that patient is intolerant of many medications. PCP also noted the patient feels bad when SBP < 130. HISTORY:  Past Medical History:  Diagnosis Date  . Arthritis   . 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  . Crohn's disease ([facility]   . GERD (gastroesophageal reflux disease)   . History of blood transfusion 1960  . Hypertension    Past Surgical History:  Procedure Laterality Date  . BRAIN SURGERY  1980   breast biopsies, benign  . BREAST LUMPECTOMY Right May 2014   T1c, N0; ER/PR positive, HER-2/neu not overexpressing.  .Marland KitchenBREAST SURGERY  1980's   [place]OSCOPY  2017  . DILATION AND CURETTAGE OF UTERUS       Family History  Problem Relation Age of Onset  . Cancer Paternal Grandmother     Allergies: Letrozole; Amlodipine; Exemestane; Hydrochlorothiazide; Lisinopril; and Sulfa antibiotics Current Outpatient Prescriptions on File Prior to Visit  Medication Sig Dispense Refill  . amoxicillin-clavulanate (AUGMENTIN) 875-125 MG tablet Take 1 tablet by mouth 2 (two) times daily. 10 tablet 0  . anastrozole (ARIMIDEX) 1 MG tablet Take 1 tablet (1 mg total) by mouth daily. 90 tablet 1  . Biotin 10 MG CAPS Take by mouth.    . Calcium Carb-Cholecalciferol (CALCIUM + D3) 600-200 MG-UNIT TABS Take by mouth daily.     . Cholecalciferol (VITAMIN D3) 2000 UNITS TABS Take 1 tablet by mouth daily.    . Clobetasol Prop Emollient Base (CLOBETASOL PROPIONATE E) 0.05 % emollient cream Apply to irritated areas twice daily until itching has resolved 30 g 2  . Fiber CHEW Chew by mouth as needed.    . fish oil-omega-3 fatty acids 1000 MG capsule Take 2 g by mouth as needed.     . furosemide (LASIX) 20 MG tablet TAKE 1 TABLET(20 MG) BY MOUTH DAILY AS NEEDED FOR FLUID RETENTION 90 tablet 0  . ibuprofen (ADVIL,MOTRIN) 200 MG tablet Take 200 mg by mouth every 6 (six) hours as needed. Reported on 10/22/2015    . inFLIXimab (REMICADE) 100 MG injection Inject 1 mg into the vein  every 8 (eight) weeks.    Marland Kitchen LORazepam (ATIVAN) 0.5 MG tablet Take 1 tablet (0.5 mg total) by mouth 2 (two) times daily. As needed for anxiety 60 tablet 5  . Multiple Vitamin (MULTIVITAMIN) tablet Take 1 tablet by mouth daily.    Marland Kitchen nystatin (MYCOSTATIN) 100000 UNIT/ML suspension Take 5 mLs (500,000 Units total) by mouth 4 (four) times daily. 120 mL 0  . polyethylene glycol (MIRALAX / GLYCOLAX) packet Take 17 g by mouth daily as needed.     . Tdap (BOOSTRIX) 5-2.5-18.5 LF-MCG/0.5 injection Inject 0.5 mLs into the muscle once. 0.5 mL 0   No current facility-administered medications on file prior to visit.     Social History  Substance Use Topics  .  Smoking status: Never Smoker  . Smokeless tobacco: Never Used  . Alcohol use No    Review of Systems  Constitutional: Negative for chills and fever.  Eyes: Negative for visual disturbance.  Respiratory: Negative for cough, shortness of breath and wheezing.   Cardiovascular: Negative for chest pain, palpitations and leg swelling.  Gastrointestinal: Negative for nausea and vomiting.  Neurological: Positive for headaches (mild). Negative for dizziness, tremors, syncope and weakness.      Objective:    BP (!) 152/82   Pulse 83   Temp 98.7 F (37.1 C) (Oral)   Ht 5' 2"  (1.575 m)   Wt 137 lb 6.4 oz (62.3 kg)   BMI 25.13 kg/m   BP Readings from Last 3 Encounters:  01/15/16 (!) 152/82  12/30/15 (!) 150/88  11/26/15 138/88    Physical Exam  Constitutional: She appears well-developed and well-nourished.  Eyes: Conjunctivae are normal.  Cardiovascular: Normal rate, regular rhythm, normal heart sounds and normal pulses.   Pulmonary/Chest: Effort normal and breath sounds normal. She has no wheezes. She has no rhonchi. She has no rales.  Neurological: She is alert.  Skin: Skin is warm and dry.  Psychiatric: She has a normal mood and affect. Her speech is normal and behavior is normal. Thought content normal.  Vitals reviewed.      Assessment & Plan:   Problem List Items Addressed This Visit      Cardiovascular and Mediastinum   Hypertension - Primary    Uncontrolled.Blood pressure 152/82 today. No signs of hypertensive urgency or emergency today. Patient and I jointly agreed to increase losartan to 50 mg and follow her blood pressure readings closely. Patient will call us with a blood pressure reading and also make a follow-up BP appointment. Pending BMP.      Relevant Medications   losartan (COZAAR) 50 MG tablet   Other Relevant Orders   Comprehensive metabolic panel     Other   Dysuria    Patient continues to have mild symptoms. Pending UA. Advised patient to start  clobetasol cream twice a day for atrophic vaginitis. Patient politely declined another pelvic exam today as this was done at her last visit.      Relevant Orders   POCT urinalysis dipstick    Other Visit Diagnoses   None.        I have discontinued Ms. Brian's escitalopram. I have also changed her losartan. Additionally, I am having her maintain her polyethylene glycol, ibuprofen, Calcium + D3, multivitamin, Biotin, fish oil-omega-3 fatty acids, Fiber, inFLIXimab, Vitamin D3, nystatin, Tdap, furosemide, anastrozole, LORazepam, Clobetasol Prop Emollient Base, and amoxicillin-clavulanate.   Meds ordered this encounter  Medications  . losartan (COZAAR) 50 MG tablet    Sig: Take 1 tablet (50  mg total) by mouth daily.    Dispense:  90 tablet    Refill:  3    Order Specific Question:   Supervising Provider    Answer:   [name] [[id number]]    Return precautions given.   Risks, benefits, and alternatives of the medications and treatment plan prescribed today were discussed, and patient expressed understanding.   Education regarding symptom management and diagnosis given to patient on AVS.  Continue to follow with TULLO, Aris Everts, MD for routine health maintenance.   Kim Sanchez and I agreed with plan.   Mable Paris, FNP

## 2016-01-16 ENCOUNTER — Other Ambulatory Visit: Payer: Self-pay | Admitting: Gastroenterology

## 2016-01-16 ENCOUNTER — Ambulatory Visit
Admission: RE | Admit: 2016-01-16 | Discharge: 2016-01-16 | Disposition: A | Discharge status: Home / Self Care | Payer: Medicare Other | Source: Ambulatory Visit | Attending: Gastroenterology | Admitting: Gastroenterology

## 2016-01-16 DIAGNOSIS — R109 Unspecified abdominal pain: Secondary | ICD-10-CM

## 2016-01-16 NOTE — Telephone Encounter (Signed)
Noted.   Thanks Fransisco Beau

## 2016-01-18 LAB — CULTURE, URINE COMPREHENSIVE

## 2016-01-20 DIAGNOSIS — K5080 Crohn's disease of both small and large intestine without complications: Secondary | ICD-10-CM | POA: Diagnosis not present

## 2016-01-20 DIAGNOSIS — K5900 Constipation, unspecified: Secondary | ICD-10-CM | POA: Diagnosis not present

## 2016-01-20 DIAGNOSIS — K5000 Crohn's disease of small intestine without complications: Secondary | ICD-10-CM | POA: Diagnosis not present

## 2016-01-27 ENCOUNTER — Ambulatory Visit
Admission: RE | Admit: 2016-01-27 | Discharge: 2016-01-27 | Disposition: A | Discharge status: Home / Self Care | Payer: Medicare Other | Source: Ambulatory Visit | Attending: Oncology | Admitting: Oncology

## 2016-01-27 DIAGNOSIS — M8588 Other specified disorders of bone density and structure, other site: Secondary | ICD-10-CM | POA: Insufficient documentation

## 2016-01-27 DIAGNOSIS — Z803 Family history of malignant neoplasm of breast: Secondary | ICD-10-CM | POA: Insufficient documentation

## 2016-01-27 DIAGNOSIS — M85851 Other specified disorders of bone density and structure, right thigh: Secondary | ICD-10-CM | POA: Diagnosis not present

## 2016-01-27 DIAGNOSIS — Z780 Asymptomatic menopausal state: Secondary | ICD-10-CM | POA: Diagnosis not present

## 2016-01-27 DIAGNOSIS — C50911 Malignant neoplasm of unspecified site of right female breast: Secondary | ICD-10-CM | POA: Diagnosis not present

## 2016-01-30 ENCOUNTER — Ambulatory Visit: Payer: Medicare Other | Admitting: Oncology

## 2016-02-02 DIAGNOSIS — Z23 Encounter for immunization: Secondary | ICD-10-CM | POA: Diagnosis not present

## 2016-02-02 NOTE — Progress Notes (Signed)
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  INTERVAL HISTORY: Patient returns to clinic today for routine six-month evaluation. She continues to tolerate anastrozole without significant side effects. She has no neurologic complaints.  She denies any fevers.  She has a good appetite and denies weight loss.  She denies any chest pain or shortness of breath.  She has no nausea, vomiting, constipation, or diarrhea.  She has no urinary complaints.  Patient offers no further specific complaints today.  REVIEW OF SYSTEMS:   Review of Systems  Constitutional: Negative.  Negative for fever, malaise/fatigue and weight loss.  Respiratory: Negative.  Negative for shortness of breath.   Cardiovascular: Negative.  Negative for chest pain.  Gastrointestinal: Negative.   Musculoskeletal: Negative.   Neurological: Negative.  Negative for weakness.  Psychiatric/Behavioral: The patient is nervous/anxious and has insomnia.    As per HPI. Otherwise, a complete review of systems is negative.  PAST MEDICAL HISTORY: Past Medical History:  Diagnosis Date  . Arthritis   . Breast cancer, right breast ([place]) 08/24/2012   Histologic grade 1, invasive mammary carcinoma, no specific type. 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  . Crohn's disease ([place])   . GERD (gastroesophageal reflux disease)   . History of blood transfusion 1960  . Hypertension     PAST SURGICAL HISTORY: Past Surgical History:  Procedure Laterality Date  . BRAIN SURGERY  1980   breast biopsies, benign  . BREAST  LUMPECTOMY Right May 2014   T1c, N0; ER/PR positive, HER-2/neu not overexpressing.  Marland Kitchen BREAST SURGERY  1980's   fibro cyst both breast in [place]  . COLONOSCOPY  2017  . DILATION AND CURETTAGE OF UTERUS      FAMILY HISTORY Family History  Problem Relation Age of Onset  . Cancer Paternal Grandmother        ADVANCED DIRECTIVES:    HEALTH MAINTENANCE: Social History  Substance Use Topics  . Smoking status: Never Smoker  . Smokeless tobacco: Never Used  . Alcohol use No     Colonoscopy:  PAP:  Bone density:  Lipid panel:  Allergies  Allergen Reactions  . Letrozole Anaphylaxis and Itching  . Amlodipine Itching  . Exemestane Nausea Only  . Hydrochlorothiazide Other (See Comments)  . Lisinopril Cough  . Sulfa Antibiotics Other (See Comments)    Current Outpatient Prescriptions  Medication Sig Dispense Refill  . anastrozole (ARIMIDEX) 1 MG tablet Take 1 tablet (1 mg total) by mouth daily. 90 tablet 1  . aspirin EC 81 MG tablet Take 81 mg by mouth daily.    Marland Kitchen inFLIXimab (REMICADE) 100 MG injection Inject 1 mg into the vein every 8 (eight) weeks.    Marland Kitchen linaclotide (LINZESS) 145 MCG CAPS capsule Take 145 mcg by mouth daily before breakfast.    . LORazepam (ATIVAN) 0.5 MG tablet Take 1 tablet (0.5 mg total) by mouth 2 (two) times daily. As needed for anxiety 60 tablet 5   No current facility-administered medications for this visit.     OBJECTIVE: Vitals:   02/03/16 1602  BP: (!) 158/82  Pulse: 87  Resp: 18  Temp: 98.8 F (37.1 C)  Body mass index is 24.62 kg/m.    ECOG FS:0 - Asymptomatic  General: Well-developed, well-nourished, no acute distress. Eyes: Pink conjunctiva, anicteric sclera. Breasts: Bilateral breast ans axilla without lumps or masses. Lungs: Clear to auscultation bilaterally. Heart: Regular rate and rhythm. No rubs, murmurs, or gallops. Abdomen: Soft, nontender, nondistended. No organomegaly noted, normoactive bowel sounds. Musculoskeletal: No  edema, cyanosis, or clubbing. Neuro: Alert, answering all questions appropriately. Cranial nerves grossly intact. Skin: No rashes or petechiae noted. Psych: Normal affect.   LAB RESULTS:  Lab Results  Component Value Date   NA 141 01/15/2016   K 3.8 01/15/2016   CL 103 01/15/2016   CO2 31 01/15/2016   GLUCOSE 114 (H) 01/15/2016   BUN 10 01/15/2016   CREATININE 0.66 01/15/2016   CALCIUM 9.3 01/15/2016   PROT 7.7 01/15/2016   ALBUMIN 4.2 01/15/2016   AST 16 01/15/2016   ALT 11 01/15/2016   ALKPHOS 83 01/15/2016   BILITOT 0.8 01/15/2016   GFRNONAA >60 08/30/2012   GFRAA >60 08/30/2012    Lab Results  Component Value Date   WBC 7.1 08/30/2014   NEUTROABS 4.5 08/30/2014   HGB 13.4 08/30/2014   HCT 39.1 08/30/2014   MCV 88.5 08/30/2014   PLT 245.0 08/30/2014     STUDIES: Dg Abd 1 View  Result Date: 01/16/2016 CLINICAL DATA:  Abdominal pain, particularly left lower quadrant for 1 week EXAM: ABDOMEN - 1 VIEW COMPARISON:  None. FINDINGS: A supine film of the abdomen shows a nonspecific bowel gas pattern. No radiographic evidence of constipation is seen. No opaque calculi are seen. The bones are unremarkable. IMPRESSION: No bowel obstruction.  No radiographic evidence of constipation. Electronically Signed   By: Ivar Drape M.D.   On: 01/16/2016 11:05   Dg Bone Density  Result Date: 01/27/2016 EXAM: DUAL X-RAY ABSORPTIOMETRY (DXA) FOR BONE MINERAL DENSITY IMPRESSION: Dear Dr. Delight Hoh, Your patient Kim Sanchez completed a BMD test on 01/27/2016 using the [employer] (analysis version: 14.10) manufactured by [employer]. The following summarizes the results of our evaluation. PATIENT BIOGRAPHICAL: Name: Kim Sanchez Patient ID: [id number] Birth Date: 12-Dec-1940 Height: 62.0 in. Gender: Female Exam Date: 01/27/2016 Weight: 137.4 lbs. Indications: Advanced Age, Breast CA, Caucasian, Family Hx of Osteoporosis, High Risk Meds, History of Breast Cancer, Parent Hip  Fracture, Postmenopausal Fractures: Treatments: anastrozole, calcium, vitamin D ASSESSMENT: The BMD measured at AP Spine L1-L4 is 1.009 g/cm2 with a T-score of -1.5. This patient is considered osteopenic according to [facility]) criteria. Patient is not a candidate for FRAX due to Anastrozole. Site Region Measured Measured WHO Young Adult BMD Date       Age      Classification T-score AP Spine L1-L4 01/27/2016 74.7 Osteopenia -1.5 1.009 g/cm2 AP Spine L1-L4 01/23/2014 72.7 Normal -1.0 1.070 g/cm2 AP Spine L1-L4 01/17/2013 71.7 Normal -0.6 1.120 g/cm2 DualFemur Neck Right 01/27/2016 74.7 Osteopenia -1.2 0.876 g/cm2 DualFemur Neck Right 01/23/2014 72.7 Normal -0.8 0.922 g/cm2 DualFemur Neck Right 01/17/2013 71.7 Normal -1.0 0.905 g/cm2 World Health Organization [facility]) criteria for post-menopausal, Caucasian Women: Normal:       T-score at or above -1 SD Osteopenia:   T-score between -1 and -2.5 SD Osteoporosis: T-score at or below -2.5 SD RECOMMENDATIONS: [place] recommends that FDA-approved medical therapies be considered in postmenopausal women and men age 72 or older with a: 1. Hip or vertebral (clinical or morphometric) fracture. 2. T-score of < -2.5 at the spine or hip.  3. Ten-year fracture probability by FRAX of 3% or greater for hip fracture or 20% or greater for major osteoporotic fracture. All treatment decisions require clinical judgment and consideration of individual patient factors, including patient preferences, co-morbidities, previous drug use, risk factors not captured in the FRAX model (e.g. falls, vitamin D deficiency, increased bone turnover, interval significant decline in bone density) and possible under - or over-estimation of fracture risk by FRAX. All patients should ensure an adequate intake of dietary calcium (1200 mg/d) and vitamin D (800 IU daily) unless contraindicated. FOLLOW-UP: People with diagnosed cases of osteoporosis or at high risk for  fracture should have regular bone mineral density tests. For patients eligible for Medicare, routine testing is allowed once every 2 years. The testing frequency can be increased to one year for patients who have rapidly progressing disease, those who are receiving or discontinuing medical therapy to restore bone mass, or have additional risk factors. Sanchez have reviewed this report, and agree with the above findings. [facility] Electronically Signed   By: Lahoma Crocker M.D.   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 No Oncotype score available.  PLAN:    1.  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  Continue with anastrozole, completing in September of 2019.  Patient's most recent mammogram on Oct 01, 2015 was reported as BI-RADS 3. Recent biopsy of her right breast on April 11, 2015 was negative for malignancy. Return to clinic in 6 months for routine evaluation. 2. Osteopenia: Bone mineral density on January 27, 2016 with a T score of -1.5 which is slightly worse than 2 years ago. Patient is taking vitamin D supplementation and have recommended to add calcium supplementation.  2. Arthritis: Continue Remicade as prescribed by her rheumatologist. 3. Elevated blood pressure: Monitor, continue treatment per PCP.  Patient expressed understanding and was in agreement with this plan. She also understands that She can call clinic at any time with any questions, concerns, or complaints.    Lloyd Huger, MD   02/04/2016 2:47 PM

## 2016-02-03 ENCOUNTER — Inpatient Hospital Stay: Payer: Medicare Other | Attending: Oncology | Admitting: Oncology

## 2016-02-03 VITALS — BP 158/82 | HR 87 | Temp 98.8°F | Resp 18 | Wt 134.6 lb

## 2016-02-03 DIAGNOSIS — K219 Gastro-esophageal reflux disease without esophagitis: Secondary | ICD-10-CM | POA: Diagnosis not present

## 2016-02-03 DIAGNOSIS — Z79899 Other long term (current) drug therapy: Secondary | ICD-10-CM | POA: Insufficient documentation

## 2016-02-03 DIAGNOSIS — C50911 Malignant neoplasm of unspecified site of right female breast: Secondary | ICD-10-CM | POA: Insufficient documentation

## 2016-02-03 DIAGNOSIS — K5090 Crohn's disease, unspecified, without complications: Secondary | ICD-10-CM | POA: Diagnosis not present

## 2016-02-03 DIAGNOSIS — Z170 Estrogen receptor positive status [ER+]: Secondary | ICD-10-CM | POA: Insufficient documentation

## 2016-02-03 DIAGNOSIS — Z7982 Long term (current) use of aspirin: Secondary | ICD-10-CM | POA: Insufficient documentation

## 2016-02-03 DIAGNOSIS — M8580 Other specified disorders of bone density and structure, unspecified site: Secondary | ICD-10-CM | POA: Insufficient documentation

## 2016-02-03 DIAGNOSIS — M129 Arthropathy, unspecified: Secondary | ICD-10-CM | POA: Insufficient documentation

## 2016-02-03 DIAGNOSIS — Z79811 Long term (current) use of aromatase inhibitors: Secondary | ICD-10-CM | POA: Insufficient documentation

## 2016-02-03 DIAGNOSIS — I10 Essential (primary) hypertension: Secondary | ICD-10-CM | POA: Diagnosis not present

## 2016-02-03 NOTE — Progress Notes (Signed)
States is feeling well. Offers no complaints. 

## 2016-02-06 ENCOUNTER — Encounter: Payer: Self-pay | Admitting: Family

## 2016-02-06 ENCOUNTER — Ambulatory Visit (INDEPENDENT_AMBULATORY_CARE_PROVIDER_SITE_OTHER): Payer: Medicare Other | Admitting: Family

## 2016-02-06 VITALS — BP 165/85 | HR 79 | Temp 98.2°F | Ht 62.0 in | Wt 136.4 lb

## 2016-02-06 DIAGNOSIS — I10 Essential (primary) hypertension: Secondary | ICD-10-CM

## 2016-02-06 MED ORDER — LOSARTAN POTASSIUM-HCTZ 50-12.5 MG PO TABS: 1.0000 | ORAL_TABLET | Freq: Every day | ORAL | 0 refills | Status: DC

## 2016-02-06 NOTE — Assessment & Plan Note (Addendum)
Uncontrolled. Patient has no signs or symptoms of hypertensive urgency or emergency at this time. I'm reassured by normal  neurologic exam. Patient's BP cuff read 158/88, when personally taken by me, I measured 165/85 ; values reported at home are likely less than true blood pressure. Patient is now on Linzess from her gastroenterologist and reporting good bowel movements. We jointly decided that due to our concern for stroke, MI,  patient would start back on dual agent losartan- hydrochlorothiazide. She will keep blood pressure log at home and follow-up in 2 weeks. Education provided stroke and heart attack symptoms.

## 2016-02-06 NOTE — Progress Notes (Signed)
Subjective:    Patient ID: Kim Sanchez, female    DOB: January 06, 1941, 75 y.o.   MRN: [id number]  CC: Kim Sanchez is a 75 y.o. female who presents today for an acute visit.    HPI: Here for acute visit for elevated blood pressure. She been on blood pressure medication however stopped because of medication. Since then GI started patient on  Per chart review, I do not see mention of that in chart. She has been on amlodipine and Microzide, hyzaar, cozaar,  in the past for blood pressure.  I saw patient 9/13 for elevated BP and advised her to start taking losartan 91m again. While on cozaar, 169/89 in morning and then afternoon 145/68.  Prior, she had seen PCP 09/2015 and prescribed losartan potassium HCTZ 50 -12.5     Denies exertional chest pain or pressure, numbness or tingling radiating to left arm or jaw, palpitations, dizziness, frequent headaches, changes in vision, or shortness of breath.        HISTORY:  Past Medical History:  Diagnosis Date  . Arthritis   . [place] ([place] 08/24/2012   Histologic grade 1, invasive mammary carcinoma, no specific type. 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  . [place]   . GERD (gastroesophageal reflux disease)   . History of blood transfusion 1960  . Hypertension    Past Surgical History:  Procedure Laterality Date  . BRAIN SURGERY  1980   breast biopsies, benign  . BREAST LUMPECTOMY Right May 2014   T1c, N0; ER/PR positive, HER-2/neu not overexpressing.  .Marland KitchenBREAST SURGERY  1980's   [place]  [place]  . DILATION AND CURETTAGE OF UTERUS     Family History  Problem Relation Age of Onset  . Cancer Paternal Grandmother     Allergies: Letrozole; Amlodipine; Exemestane; Hydrochlorothiazide; Lisinopril; and Sulfa antibiotics Current Outpatient Prescriptions on File Prior to Visit  Medication Sig Dispense Refill  .  anastrozole (ARIMIDEX) 1 MG tablet Take 1 tablet (1 mg total) by mouth daily. 90 tablet 1  . aspirin EC 81 MG tablet Take 81 mg by mouth daily.    .Marland KitcheninFLIXimab (REMICADE) 100 MG injection Inject 1 mg into the vein every 8 (eight) weeks.    .Marland Kitchenlinaclotide (LINZESS) 145 MCG CAPS capsule Take 145 mcg by mouth daily before breakfast.    . LORazepam (ATIVAN) 0.5 MG tablet Take 1 tablet (0.5 mg total) by mouth 2 (two) times daily. As needed for anxiety 60 tablet 5   No current facility-administered medications on file prior to visit.     Social History  Substance Use Topics  . Smoking status: Never Smoker  . Smokeless tobacco: Never Used  . Alcohol use No    Review of Systems  Constitutional: Negative for chills and fever.  Eyes: Negative for visual disturbance.  Respiratory: Negative for cough.   Cardiovascular: Negative for chest pain and palpitations.  Gastrointestinal: Negative for nausea and vomiting.      Objective:    BP (!) 165/85   Pulse 79   Temp 98.2 F (36.8 C) (Oral)   Ht 5' 2"  (1.575 m)   Wt 136 lb 6.4 oz (61.9 kg)   SpO2 98%   BMI 24.95 kg/m    Physical Exam  Constitutional: She appears well-developed and well-nourished.  HENT:  Mouth/Throat: Uvula is midline, oropharynx is clear and moist and mucous membranes  are normal.  Eyes: Conjunctivae and EOM are normal. Pupils are equal, round, and reactive to light.  Fundus normal bilaterally.   Cardiovascular: Normal rate, regular rhythm, normal heart sounds and normal pulses.   Pulmonary/Chest: Effort normal and breath sounds normal. She has no wheezes. She has no rhonchi. She has no rales.  Neurological: She is alert. She has normal strength. No cranial nerve deficit or sensory deficit. She displays a negative Romberg sign.  Reflex Scores:      Bicep reflexes are 2+ on the right side and 2+ on the left side.      Patellar reflexes are 2+ on the right side and 2+ on the left side. Grip equal and strong bilateral  upper extremities. Gait strong and steady. Able to perform rapid alternating movement without difficulty.   Skin: Skin is warm and dry.  Psychiatric: She has a normal mood and affect. Her speech is normal and behavior is normal. Thought content normal.  Vitals reviewed.      Assessment & Plan:   Problem List Items Addressed This Visit      Cardiovascular and Mediastinum   Hypertension - Primary    Uncontrolled. Patient has no signs or symptoms of hypertensive urgency or emergency at this time. I'm reassured by normal  neurologic exam. Patient's BP cuff read 158/88, when personally taken by me, I measured 165/85 ; values reported at home are likely less than true blood pressure. Patient is now on Linzess from her gastroenterologist and reporting good bowel movements. We jointly decided that due to our concern for stroke, MI,  patient would start back on dual agent losartan- hydrochlorothiazide. She will keep blood pressure log at home and follow-up in 2 weeks. Education provided stroke and heart attack symptoms.      Relevant Medications   losartan-hydrochlorothiazide (HYZAAR) 50-12.5 MG tablet    Other Visit Diagnoses   None.      I am having [name] maintain her inFLIXimab, anastrozole, LORazepam, linaclotide, aspirin EC, and losartan-hydrochlorothiazide.   Meds ordered this encounter  Medications  . losartan-hydrochlorothiazide (HYZAAR) 50-12.5 MG tablet    Sig: Take 1 tablet by mouth daily.    Dispense:  90 tablet    Refill:  0    **Patient requests 90 days supply**    Order Specific Question:   Supervising Provider    Answer:   Kim Sanchez [[id number]]    Return precautions given.   Risks, benefits, and alternatives of the medications and treatment plan prescribed today were discussed, and patient expressed understanding.   Education regarding symptom management and diagnosis given to patient on AVS.  Continue to follow with Kim Sanchez, Kim Everts, MD for routine health  maintenance.   Kim Sanchez and I agreed with plan.   Mable Paris, FNP

## 2016-02-06 NOTE — Progress Notes (Signed)
Pre visit review using our clinic review tool, if applicable. No additional management support is needed unless otherwise documented below in the visit note. 

## 2016-02-06 NOTE — Patient Instructions (Addendum)
Trial of losartan and hydrochlorothiazide. As we discussed placing very vigilant with any signs or symptoms to suggest stroke or heart attack. Please follow-up in 2 weeks   Heart Attack A heart attack (myocardial infarction, MI) causes damage to the heart that cannot be fixed. A heart attack often happens when a blood clot or other blockage cuts blood flow to the heart. When this happens, certain areas of the heart begin to die. This causes the pain you feel during a heart attack. HOME CARE  Take medicine as told by your doctor. You may need medicine to:  Keep your blood from clotting too easily.  Control your blood pressure.  Lower your cholesterol.  Control abnormal heart rhythms.  Change certain behaviors as told by your doctor. This may include:  Quitting smoking.  Being active.  Eating a heart-healthy diet. Ask your doctor for help with this diet.  Keeping a healthy weight.  Keeping your diabetes under control.  Lessening stress.  Limiting how much alcohol you drink. Do not take these medicines unless your doctor says that you can:  Nonsteroidal anti-inflammatory drugs (NSAIDs). These include:  Ibuprofen.  Naproxen.  Celecoxib.  Vitamin supplements that have vitamin A, vitamin E, or both.  Hormone therapy that contains estrogen with or without progestin. GET HELP RIGHT AWAY IF:  You have sudden chest discomfort.  You have sudden discomfort in your:  Arms.  Back.  Neck.  Jaw.  You have shortness of breath at any time.  You have sudden sweating or clammy skin.  You feel sick to your stomach (nauseous) or throw up (vomit).  You suddenly get light-headed or dizzy.  You feel your heart beating fast or skipping beats. These symptoms may be an emergency. Do not wait to see if the symptoms will go away. Get medical help right away. Call your local emergency services (911 in the [place] Do not drive yourself to the hospital.   This information is not  intended to replace advice given to you by your health care provider. Make sure you discuss any questions you have with your health care provider.   Document Released: 10/20/2011 Document Revised: 09/04/2014 Document Reviewed: 06/23/2013 Elsevier Interactive Patient Education 2016 [employer].   Stroke Prevention Some medical conditions and behaviors are associated with an increased chance of having a stroke. You may prevent a stroke by making healthy choices and managing medical conditions. HOW CAN I REDUCE MY RISK OF HAVING A STROKE?   Stay physically active. Get at least 30 minutes of activity on most or all days.  Do not smoke. It may also be helpful to avoid exposure to secondhand smoke.  Limit alcohol use. Moderate alcohol use is considered to be:  No more than 2 drinks per day for men.  No more than 1 drink per day for nonpregnant women.  Eat healthy foods. This involves:  Eating 5 or more servings of fruits and vegetables a day.  Making dietary changes that address high blood pressure (hypertension), high cholesterol, diabetes, or obesity.  Manage your cholesterol levels.  Making food choices that are high in fiber and low in saturated fat, trans fat, and cholesterol may control cholesterol levels.  Take any prescribed medicines to control cholesterol as directed by your health care provider.  Manage your diabetes.  Controlling your carbohydrate and sugar intake is recommended to manage diabetes.  Take any prescribed medicines to control diabetes as directed by your health care provider.  Control your hypertension.  Making food choices  that are low in salt (sodium), saturated fat, trans fat, and cholesterol is recommended to manage hypertension.  Ask your health care provider if you need treatment to lower your blood pressure. Take any prescribed medicines to control hypertension as directed by your health care provider.  If you are 15-48 years of age, have your  blood pressure checked every 3-5 years. If you are 25 years of age or older, have your blood pressure checked every year.  Maintain a healthy weight.  Reducing calorie intake and making food choices that are low in sodium, saturated fat, trans fat, and cholesterol are recommended to manage weight.  Stop drug abuse.  Avoid taking birth control pills.  Talk to your health care provider about the risks of taking birth control pills if you are over 53 years old, smoke, get migraines, or have ever had a blood clot.  Get evaluated for sleep disorders (sleep apnea).  Talk to your health care provider about getting a sleep evaluation if you snore a lot or have excessive sleepiness.  Take medicines only as directed by your health care provider.  For some people, aspirin or blood thinners (anticoagulants) are helpful in reducing the risk of forming abnormal blood clots that can lead to stroke. If you have the irregular heart rhythm of atrial fibrillation, you should be on a blood thinner unless there is a good reason you cannot take them.  Understand all your medicine instructions.  Make sure that other conditions (such as anemia or atherosclerosis) are addressed. SEEK IMMEDIATE MEDICAL CARE IF:   You have sudden weakness or numbness of the face, arm, or leg, especially on one side of the body.  Your face or eyelid droops to one side.  You have sudden confusion.  You have trouble speaking (aphasia) or understanding.  You have sudden trouble seeing in one or both eyes.  You have sudden trouble walking.  You have dizziness.  You have a loss of balance or coordination.  You have a sudden, severe headache with no known cause.  You have new chest pain or an irregular heartbeat. Any of these symptoms may represent a serious problem that is an emergency. Do not wait to see if the symptoms will go away. Get medical help at once. Call your local emergency services (911 in [place] Do not drive  yourself to the hospital.   This information is not intended to replace advice given to you by your health care provider. Make sure you discuss any questions you have with your health care provider.   Document Released: 05/28/2004 Document Revised: 05/11/2014 Document Reviewed: 10/21/2012 Elsevier Interactive Patient Education [employer].

## 2016-02-14 ENCOUNTER — Other Ambulatory Visit: Payer: Self-pay | Admitting: Internal Medicine

## 2016-02-15 NOTE — Telephone Encounter (Signed)
REFILLED

## 2016-02-15 NOTE — Telephone Encounter (Signed)
Refilled 11/14/15. Pt last seen for this diagnosis 11/26/15. Please advise?

## 2016-02-21 ENCOUNTER — Ambulatory Visit (INDEPENDENT_AMBULATORY_CARE_PROVIDER_SITE_OTHER): Payer: Medicare Other | Admitting: Internal Medicine

## 2016-02-21 ENCOUNTER — Encounter: Payer: Self-pay | Admitting: Internal Medicine

## 2016-02-21 DIAGNOSIS — S86911A Strain of unspecified muscle(s) and tendon(s) at lower leg level, right leg, initial encounter: Secondary | ICD-10-CM | POA: Diagnosis not present

## 2016-02-21 DIAGNOSIS — K5900 Constipation, unspecified: Secondary | ICD-10-CM

## 2016-02-21 DIAGNOSIS — I10 Essential (primary) hypertension: Secondary | ICD-10-CM | POA: Diagnosis not present

## 2016-02-21 DIAGNOSIS — K5010 Crohn's disease of large intestine without complications: Secondary | ICD-10-CM | POA: Diagnosis not present

## 2016-02-21 DIAGNOSIS — F411 Generalized anxiety disorder: Secondary | ICD-10-CM

## 2016-02-21 DIAGNOSIS — S8011XD Contusion of right lower leg, subsequent encounter: Secondary | ICD-10-CM | POA: Diagnosis not present

## 2016-02-21 MED ORDER — HYDROCHLOROTHIAZIDE 12.5 MG PO TABS: 12.5000 mg | ORAL_TABLET | Freq: Every day | ORAL | 1 refills | Status: DC

## 2016-02-21 MED ORDER — LOSARTAN POTASSIUM 50 MG PO TABS: 50.0000 mg | ORAL_TABLET | Freq: Every day | ORAL | 3 refills | Status: DC

## 2016-02-21 NOTE — Progress Notes (Signed)
Subjective:  Patient ID: Kim Sanchez, female    DOB: [birth year]  Age: 75 y.o. MRN: [id number]  CC: Diagnoses of Essential hypertension, Generalized anxiety disorder, Constipation, unspecified constipation type, and Crohn's disease of large intestine without complication ([place]) were pertinent to this visit. Kim Sanchez presents for follow up on hypertension with persistently elevated readings complicated  by generalized anxiety and multiple drug intolerances.  She is gradually adjusting to her  Medications, but has been having some nausea during the day and wants to discuss taking medication at night.   Fatigue still an ever present problem.  Sleeps well,  Does not exercise. .   home BPs range from 126 to 150   Saw Dr Kim Sanchez at [facility] for persistent  constipation and he prescribed Linzess which she has been taking for about a week   She has increased the dose as directed and is currently taking 145 mcg dose.  Still using miralax daily   Outpatient Medications Prior to Visit  Medication Sig Dispense Refill  . anastrozole (ARIMIDEX) 1 MG tablet Take 1 tablet (1 mg total) by mouth daily. 90 tablet 1  . aspirin EC 81 MG tablet Take 81 mg by mouth daily.    Marland Kitchen inFLIXimab (REMICADE) 100 MG injection Inject 1 mg into the vein every 8 (eight) weeks.    Marland Kitchen linaclotide (LINZESS) 145 MCG CAPS capsule Take 145 mcg by mouth daily before breakfast.    . LORazepam (ATIVAN) 0.5 MG tablet TAKE 1 TABLET BY MOUTH TWICE DAILY AS NEEDED FOR ANXIETY 60 tablet 5  . losartan-hydrochlorothiazide (HYZAAR) 50-12.5 MG tablet Take 1 tablet by mouth daily. 90 tablet 0   No facility-administered medications prior to visit.     Review of Systems;  Patient denies headache, fevers, malaise, unintentional weight loss, skin rash, eye pain, sinus congestion and sinus pain, sore throat, dysphagia,  hemoptysis , cough, dyspnea, wheezing, chest pain, palpitations, orthopnea, edema, abdominal pain, nausea, melena, diarrhea,  constipation, flank pain, dysuria, hematuria, urinary  Frequency, nocturia, numbness, tingling, seizures,  Focal weakness, Loss of consciousness,  Tremor, insomnia, depression, anxiety, and suicidal ideation.      Objective:  BP (!) 150/78   Pulse 75   Temp 98.2 F (36.8 C) (Oral)   Resp 12   Ht 5' 2"  (1.575 m)   Wt 134 lb (60.8 kg)   SpO2 95%   BMI 24.51 kg/m   BP Readings from Last 3 Encounters:  02/21/16 (!) 150/78  02/06/16 (!) 165/85  02/03/16 (!) 158/82    Wt Readings from Last 3 Encounters:  02/21/16 134 lb (60.8 kg)  02/06/16 136 lb 6.4 oz (61.9 kg)  02/03/16 134 lb 9.5 oz (61 kg)    General appearance: alert, cooperative and appears stated age Ears: normal TM's and external ear canals both ears Throat: lips, mucosa, and tongue normal; teeth and gums normal Neck: no adenopathy, no carotid bruit, supple, symmetrical, trachea midline and thyroid not enlarged, symmetric, no tenderness/mass/nodules Back: symmetric, no curvature. ROM normal. No CVA tenderness. Lungs: clear to auscultation bilaterally Heart: regular rate and rhythm, S1, S2 normal, no murmur, click, rub or gallop Abdomen: soft, non-tender; bowel sounds normal; no masses,  no organomegaly Pulses: 2+ and symmetric Skin: Skin color, texture, turgor normal. No rashes or lesions Lymph nodes: Cervical, supraclavicular, and axillary nodes normal.  Lab Results  Component Value Date   HGBA1C 6.3 10/02/2015   HGBA1C 6.4 03/15/2015   HGBA1C 6.1 08/30/2014    Lab Results  Component Value Date   CREATININE 0.66 01/15/2016   CREATININE 0.66 10/02/2015   CREATININE 0.67 03/15/2015    Lab Results  Component Value Date   WBC 7.1 08/30/2014   HGB 13.4 08/30/2014   HCT 39.1 08/30/2014   PLT 245.0 08/30/2014   GLUCOSE 114 (H) 01/15/2016   CHOL 255 (H) 08/30/2014   TRIG 96.0 08/30/2014   HDL 79.20 08/30/2014   LDLDIRECT 151.5 10/03/2013   LDLCALC 157 (H) 08/30/2014   ALT 11 01/15/2016   AST 16  01/15/2016   NA 141 01/15/2016   K 3.8 01/15/2016   CL 103 01/15/2016   CREATININE 0.66 01/15/2016   BUN 10 01/15/2016   CO2 31 01/15/2016   TSH 2.08 08/30/2014   HGBA1C 6.3 10/02/2015   MICROALBUR 1.3 10/02/2015    Dg Bone Density  Result Date: 01/27/2016 EXAM: DUAL X-RAY ABSORPTIOMETRY (DXA) FOR BONE MINERAL DENSITY IMPRESSION: Dear Dr. Delight Sanchez, Your patient Kim Sanchez completed a BMD test on 01/27/2016 using the [place] (analysis version: 14.10) manufactured by [employer]. The following summarizes the results of our evaluation. PATIENT BIOGRAPHICAL: Name: Kim, Cuthrell Sanchez Patient ID: [id number] Birth Date: Nov 28, 1940 Height: 62.0 in. Gender: Female Exam Date: 01/27/2016 Weight: 137.4 lbs. Indications: Advanced Age, Breast CA, Caucasian, Family Hx of Osteoporosis, High Risk Meds, History of Breast Cancer, Parent Hip Fracture, Postmenopausal Fractures: Treatments: anastrozole, calcium, vitamin D ASSESSMENT: The BMD measured at AP Spine L1-L4 is 1.009 g/cm2 with a T-score of -1.5. This patient is considered osteopenic according to [facility]) criteria. Patient is not a candidate for FRAX due to Anastrozole. Site Region Measured Measured WHO Young Adult BMD Date       Age      Classification T-score AP Spine L1-L4 01/27/2016 74.7 Osteopenia -1.5 1.009 g/cm2 AP Spine L1-L4 01/23/2014 72.7 Normal -1.0 1.070 g/cm2 AP Spine L1-L4 01/17/2013 71.7 Normal -0.6 1.120 g/cm2 DualFemur Neck Right 01/27/2016 74.7 Osteopenia -1.2 0.876 g/cm2 DualFemur Neck Right 01/23/2014 72.7 Normal -0.8 0.922 g/cm2 DualFemur Neck Right 01/17/2013 71.7 Normal -1.0 0.905 g/cm2 World Health Organization [facility]) criteria for post-menopausal, Caucasian Women: Normal:       T-score at or above -1 SD Osteopenia:   T-score between -1 and -2.5 SD Osteoporosis: T-score at or below -2.5 SD RECOMMENDATIONS: [place] recommends that FDA-approved medical therapies be considered in  postmenopausal women and men age 63 or older with a: 1. Hip or vertebral (clinical or morphometric) fracture. 2. T-score of < -2.5 at the spine or hip. 3. Ten-year fracture probability by FRAX of 3% or greater for hip fracture or 20% or greater for major osteoporotic fracture. All treatment decisions require clinical judgment and consideration of individual patient factors, including patient preferences, co-morbidities, previous drug use, risk factors not captured in the FRAX model (e.g. falls, vitamin D deficiency, increased bone turnover, interval significant decline in bone density) and possible under - or over-estimation of fracture risk by FRAX. All patients should ensure an adequate intake of dietary calcium (1200 mg/d) and vitamin D (800 IU daily) unless contraindicated. FOLLOW-UP: People with diagnosed cases of osteoporosis or at high risk for fracture should have regular bone mineral density tests. For patients eligible for Medicare, routine testing is allowed once every 2 years. The testing frequency can be increased to one year for patients who have rapidly progressing disease, those who are receiving or discontinuing medical therapy to restore bone mass, or have additional risk factors. Sanchez have reviewed this report, and  agree with the above findings. [facility] Radiology Electronically Signed   By: Lahoma Crocker M.D.   On: 01/27/2016 11:30    Assessment & Plan:   Problem List Items Addressed This Visit    Crohn's disease [facility])    Managed for the last ten years with Remiciade infusions done at [facility] ,  Her Gastroenterologist is DrLiane Sanchez Last colonoscopy June 2017.      Hypertension    Sanchez have split her losartan/hct into separate medications so she can take the losartan at night.  No dose changes.  Lab Results  Component Value Date   CREATININE 0.66 01/15/2016   Lab Results  Component Value Date   NA 141 01/15/2016   K 3.8 01/15/2016   CL 103 01/15/2016   CO2 31 01/15/2016          Relevant Medications   losartan (COZAAR) 50 MG tablet   hydrochlorothiazide (HYDRODIURIL) 12.5 MG tablet   Constipation    Now taking Linzess per last visit with  Dr Kim Sanchez in September at [facility] And daily miralax.       Generalized anxiety disorder    Aggravated by husband's CVA and subsequent depression .  She has been intolerant of SSRI trials and prefers to use lorazepam. She has multipel somatic complaints that were individually addresssed today. The risks and benefits of benzodiazepine use were reviewed with patient today including excessive sedation leading to respiratory depression,  impaired thinking/driving, and addiction.  Patient was advised to avoid concurrent use with alcohol, to use medication only as needed and not to share with others  .        Other Visit Diagnoses   None.    A total of 25 minutes of face to face time was spent with patient more than half of which was spent in counselling about the above mentioned conditions  and coordination of care  Sanchez have discontinued Ms. Litsey's losartan-hydrochlorothiazide. Sanchez am also having her start on losartan. Additionally, Sanchez am having her maintain her inFLIXimab, anastrozole, linaclotide, aspirin EC, LORazepam, and hydrochlorothiazide.  Meds ordered this encounter  Medications  . losartan (COZAAR) 50 MG tablet    Sig: Take 1 tablet (50 mg total) by mouth daily.    Dispense:  90 tablet    Refill:  3  . DISCONTD: hydrochlorothiazide (HYDRODIURIL) 12.5 MG tablet    Sig: Take 1 tablet (12.5 mg total) by mouth daily.    Dispense:  90 tablet    Refill:  1  . hydrochlorothiazide (HYDRODIURIL) 12.5 MG tablet    Sig: Take 1 tablet (12.5 mg total) by mouth daily.    Dispense:  90 tablet    Refill:  1    Medications Discontinued During This Encounter  Medication Reason  . losartan-hydrochlorothiazide (HYZAAR) 50-12.5 MG tablet   . hydrochlorothiazide (HYDRODIURIL) 12.5 MG tablet Reorder    Follow-up: No Follow-up on  file.   [name], MD

## 2016-02-21 NOTE — Progress Notes (Signed)
Pre-visit discussion using our clinic review tool. No additional management support is needed unless otherwise documented below in the visit note.  

## 2016-02-21 NOTE — Patient Instructions (Addendum)
You can use up to 2000 mg of tylenol every day  For arthritis pain .  This will not affect your blood pressure  When you finish your current bottle of BP medication ,  We will split up the two medications so you can take the hctz in the morning and the losartan in the evening   Hold the two new prescriptions for when that day that day comes  The veins in your hands become "engorged" (swollen) and will respond  To  elevation and to your HCTZ

## 2016-02-23 NOTE — Assessment & Plan Note (Addendum)
Now taking Linzess per last visit with  Dr Liane Comber in September at [facility] And daily miralax.

## 2016-02-23 NOTE — Assessment & Plan Note (Signed)
Managed for the last ten years with Remiciade infusions done at [facility] ,  Her Gastroenterologist is DrLiane Comber Last colonoscopy June 2017.

## 2016-02-23 NOTE — Assessment & Plan Note (Addendum)
Aggravated by husband's CVA and subsequent depression .  She has been intolerant of SSRI trials and prefers to use lorazepam. She has multipel somatic complaints that were individually addresssed today. The risks and benefits of benzodiazepine use were reviewed with patient today including excessive sedation leading to respiratory depression,  impaired thinking/driving, and addiction.  Patient was advised to avoid concurrent use with alcohol, to use medication only as needed and not to share with others  .

## 2016-02-23 NOTE — Assessment & Plan Note (Signed)
I have split her losartan/hct into separate medications so she can take the losartan at night.  No dose changes.  Lab Results  Component Value Date   CREATININE 0.66 01/15/2016   Lab Results  Component Value Date   NA 141 01/15/2016   K 3.8 01/15/2016   CL 103 01/15/2016   CO2 31 01/15/2016

## 2016-02-28 ENCOUNTER — Encounter: Payer: Self-pay | Admitting: *Deleted

## 2016-03-05 ENCOUNTER — Telehealth: Payer: Self-pay | Admitting: Internal Medicine

## 2016-03-05 NOTE — Telephone Encounter (Signed)
I called pt and left a vm to call office to sch AWV. Thank you!

## 2016-03-19 DIAGNOSIS — K5080 Crohn's disease of both small and large intestine without complications: Secondary | ICD-10-CM | POA: Diagnosis not present

## 2016-03-30 ENCOUNTER — Other Ambulatory Visit: Payer: Self-pay | Admitting: *Deleted

## 2016-03-30 MED ORDER — ANASTROZOLE 1 MG PO TABS: 1.0000 mg | ORAL_TABLET | Freq: Every day | ORAL | 1 refills | Status: DC

## 2016-04-01 ENCOUNTER — Ambulatory Visit (INDEPENDENT_AMBULATORY_CARE_PROVIDER_SITE_OTHER): Payer: Medicare Other | Admitting: Internal Medicine

## 2016-04-01 ENCOUNTER — Encounter: Payer: Self-pay | Admitting: Internal Medicine

## 2016-04-01 VITALS — BP 158/82 | HR 75 | Temp 97.7°F | Resp 12 | Ht 61.0 in | Wt 137.2 lb

## 2016-04-01 DIAGNOSIS — I10 Essential (primary) hypertension: Secondary | ICD-10-CM

## 2016-04-01 DIAGNOSIS — K5904 Chronic idiopathic constipation: Secondary | ICD-10-CM

## 2016-04-01 DIAGNOSIS — R7301 Impaired fasting glucose: Secondary | ICD-10-CM

## 2016-04-01 DIAGNOSIS — F411 Generalized anxiety disorder: Secondary | ICD-10-CM

## 2016-04-01 DIAGNOSIS — E7800 Pure hypercholesterolemia, unspecified: Secondary | ICD-10-CM

## 2016-04-01 DIAGNOSIS — R5383 Other fatigue: Secondary | ICD-10-CM | POA: Diagnosis not present

## 2016-04-01 DIAGNOSIS — H2513 Age-related nuclear cataract, bilateral: Secondary | ICD-10-CM | POA: Diagnosis not present

## 2016-04-01 DIAGNOSIS — F409 Phobic anxiety disorder, unspecified: Secondary | ICD-10-CM

## 2016-04-01 DIAGNOSIS — E559 Vitamin D deficiency, unspecified: Secondary | ICD-10-CM

## 2016-04-01 DIAGNOSIS — Z0000 Encounter for general adult medical examination without abnormal findings: Secondary | ICD-10-CM

## 2016-04-01 DIAGNOSIS — E119 Type 2 diabetes mellitus without complications: Secondary | ICD-10-CM

## 2016-04-01 DIAGNOSIS — F5105 Insomnia due to other mental disorder: Secondary | ICD-10-CM

## 2016-04-01 LAB — CBC WITH DIFFERENTIAL/PLATELET
BASOS ABS: 0 10*3/uL (ref 0.0–0.1)
BASOS PCT: 0.7 % (ref 0.0–3.0)
EOS ABS: 0.1 10*3/uL (ref 0.0–0.7)
Eosinophils Relative: 2.2 % (ref 0.0–5.0)
HEMATOCRIT: 42 % (ref 36.0–46.0)
Hemoglobin: 14.3 g/dL (ref 12.0–15.0)
LYMPHS ABS: 1.8 10*3/uL (ref 0.7–4.0)
LYMPHS PCT: 31.5 % (ref 12.0–46.0)
MCHC: 34.2 g/dL (ref 30.0–36.0)
MCV: 91.2 fl (ref 78.0–100.0)
Monocytes Absolute: 0.4 10*3/uL (ref 0.1–1.0)
Monocytes Relative: 6.4 % (ref 3.0–12.0)
NEUTROS ABS: 3.4 10*3/uL (ref 1.4–7.7)
NEUTROS PCT: 59.2 % (ref 43.0–77.0)
PLATELETS: 240 10*3/uL (ref 150.0–400.0)
RBC: 4.6 Mil/uL (ref 3.87–5.11)
RDW: 14 % (ref 11.5–15.5)
WBC: 5.7 10*3/uL (ref 4.0–10.5)

## 2016-04-01 LAB — COMPREHENSIVE METABOLIC PANEL
ALT: 14 U/L (ref 0–35)
AST: 17 U/L (ref 0–37)
Albumin: 4.6 g/dL (ref 3.5–5.2)
Alkaline Phosphatase: 89 U/L (ref 39–117)
BUN: 15 mg/dL (ref 6–23)
CALCIUM: 9.9 mg/dL (ref 8.4–10.5)
CHLORIDE: 101 meq/L (ref 96–112)
CO2: 29 meq/L (ref 19–32)
Creatinine, Ser: 0.7 mg/dL (ref 0.40–1.20)
GFR: 86.72 mL/min (ref >60.00)
GLUCOSE: 104 mg/dL — AB (ref 70–99)
POTASSIUM: 4 meq/L (ref 3.5–5.1)
Sodium: 140 mEq/L (ref 135–145)
Total Bilirubin: 0.9 mg/dL (ref 0.2–1.2)
Total Protein: 7.7 g/dL (ref 6.0–8.3)

## 2016-04-01 LAB — LIPID PANEL
CHOL/HDL RATIO: 3
CHOLESTEROL: 304 mg/dL — AB (ref 0–200)
HDL: 87.3 mg/dL (ref >39.00)
LDL CALC: 196 mg/dL — AB (ref 0–99)
NonHDL: 217.01
TRIGLYCERIDES: 106 mg/dL (ref 0.0–149.0)
VLDL: 21.2 mg/dL (ref 0.0–40.0)

## 2016-04-01 LAB — HEMOGLOBIN A1C: Hgb A1c MFr Bld: 6.5 % (ref 4.6–6.5)

## 2016-04-01 LAB — TSH: TSH: 2.15 u[IU]/mL (ref 0.35–4.50)

## 2016-04-01 LAB — VITAMIN D 25 HYDROXY (VIT D DEFICIENCY, FRACTURES): VITD: 29.81 ng/mL — ABNORMAL LOW (ref 30.00–100.00)

## 2016-04-01 MED ORDER — LORAZEPAM 0.5 MG PO TABS: 0.5000 mg | ORAL_TABLET | Freq: Three times a day (TID) | ORAL | 5 refills | Status: DC | PRN

## 2016-04-01 NOTE — Progress Notes (Signed)
Patient ID: Kim Sanchez, female    DOB: 1941/02/12  Age: 75 y.o. MRN: [id number]  The patient is here for annual Medicare wellness examination and management of other chronic and acute problems.    risk factors are reflected in the social history.  The roster of all physicians providing medical care to patient - is listed in the Snapshot section of the chart.  Activities of daily living:  The patient is 100% independent in all ADLs: dressing, toileting, feeding as well as independent mobility  Home safety : The patient has smoke detectors in the home. They wear seatbelts.  There are no firearms at home. There is no violence in the home.   There is no risks for hepatitis, STDs or HIV. There is no   history of blood transfusion. They have no travel history to infectious disease endemic areas of the world.  The patient has seen their dentist in the last six month. They have seen their eye doctor in the last year. They have no hearing difficulty with regard to whispered voices and some television programs.  They have deferred audiologic testing in the last year.    They do not  have excessive sun exposure. Discussed the need for sun protection: hats, long sleeves and use of sunscreen if there is significant sun exposure.   Diet: the importance of a healthy diet is discussed. They do have a healthy diet.  The benefits of regular aerobic exercise were discussed. She walks 4 times per week ,  20 minutes.   Depression screen: there are no signs or vegative symptoms of depression- irritability, change in appetite, anhedonia, sadness/tearfullness.  Cognitive assessment: the patient manages all their financial and personal affairs and is actively engaged. They could relate day,date,year and events; recalled 2/3 objects at 3 minutes; performed clock-face test normally.  The following portions of the patient's history were reviewed and updated as appropriate: allergies, current medications, past family  history, past medical history,  past surgical history, past social history  and problem list.  Visual acuity was not assessed per patient preference since she has regular follow up with her ophthalmologist. Hearing and body mass index were assessed and reviewed.   During the course of the visit the patient was educated and counseled about appropriate screening and preventive services including : fall prevention , diabetes screening, nutrition counseling, colorectal cancer screening, and recommended immunizations.    CC: The primary encounter diagnosis was Fatigue, unspecified type. Diagnoses of Pure hypercholesterolemia, Impaired fasting blood sugar, Vitamin D deficiency, Insomnia due to anxiety and fear, Essential hypertension, Generalized anxiety disorder, Chronic idiopathic constipation, Encounter for preventive health examination, and Diabetes mellitus with no complication (Kim Sanchez) were also pertinent to this visit.   linzess prescribed by GI causing diarrhea  Alternating with constipation.  Taking it daily with her blood pressure medication.   Kim Sanchez having major anxiety issues.  Relieved with lorazepam, but has had to increase use to 3 times daily at times.Her anxiety is aggravated by caregiver issues     History2   Kim Sanchez has a past medical history of Arthritis; Breast cancer, right breast (Home) (08/24/2012); Crohn's disease ([place]); GERD (gastroesophageal reflux disease); History of blood transfusion (1960); and Hypertension.   She has a past surgical history that includes Dilation and curettage of uterus; Brain surgery (1980); Breast surgery (1980's); Breast lumpectomy (Right, May 2014); and Colonoscopy (2017).   Her family history includes Cancer in her paternal grandmother.She reports that she has never smoked. She has never  used smokeless tobacco. She reports that she does not drink alcohol or use drugs.  Outpatient Medications Prior to Visit  Medication Sig Dispense Refill  .  anastrozole (ARIMIDEX) 1 MG tablet Take 1 tablet (1 mg total) by mouth daily. 90 tablet 1  . aspirin EC 81 MG tablet Take 81 mg by mouth daily.    . hydrochlorothiazide (HYDRODIURIL) 12.5 MG tablet Take 1 tablet (12.5 mg total) by mouth daily. 90 tablet 1  . inFLIXimab (REMICADE) 100 MG injection Inject 1 mg into the vein every 8 (eight) weeks.    Marland Kitchen linaclotide (LINZESS) 145 MCG CAPS capsule Take 145 mcg by mouth daily before breakfast.    . losartan (COZAAR) 50 MG tablet Take 1 tablet (50 mg total) by mouth daily. 90 tablet 3  . LORazepam (ATIVAN) 0.5 MG tablet TAKE 1 TABLET BY MOUTH TWICE DAILY AS NEEDED FOR ANXIETY 60 tablet 5   No facility-administered medications prior to visit.     Review of Systems   Patient denies headache, fevers, malaise, unintentional weight loss, skin rash, eye pain, sinus congestion and sinus pain, sore throat, dysphagia,  hemoptysis , cough, dyspnea, wheezing, chest pain, palpitations, orthopnea, edema, abdominal pain, nausea, melena, , flank pain, dysuria, hematuria, urinary  Frequency, nocturia, numbness, tingling, seizures,  Focal weakness, Loss of consciousness,  Tremor,  depression, , and suicidal ideation.    Objective:  BP (!) 158/82   Pulse 75   Temp 97.7 F (36.5 C) (Oral)   Resp 12   Ht 5' 1"  (1.549 m)   Wt 137 lb 4 oz (62.3 kg)   SpO2 98%   BMI 25.93 kg/m    Physical Exam  General appearance: alert, cooperative and appears stated age Head: Normocephalic, without obvious abnormality, atraumatic Eyes: conjunctivae/corneas clear. PERRL, EOM's intact. Fundi benign. Ears: normal TM's and external ear canals both ears Nose: Nares normal. Septum midline. Mucosa normal. No drainage or sinus tenderness. Throat: lips, mucosa, and tongue normal; teeth and gums normal Neck: no adenopathy, no carotid bruit, no JVD, supple, symmetrical, trachea midline and thyroid not enlarged, symmetric, no tenderness/mass/nodules Lungs: clear to auscultation  bilaterally Breasts: right breast surgical scar, otherwise  normal appearance, no masses or tenderness Heart: regular rate and rhythm, S1, S2 normal, no murmur, click, rub or gallop Abdomen: soft, non-tender; bowel sounds normal; no masses,  no organomegaly Extremities: extremities normal, atraumatic, no cyanosis or edema Pulses: 2+ and symmetric Skin: Skin color, texture, turgor normal. No rashes or lesions Neurologic: Alert and oriented X 3, normal strength and tone. Normal symmetric reflexes. Normal coordination and gait.   Psych: affect normal, makes good eye contact. No fidgeting,  Smiles easily.  Denies suicidal thoughts   Assessment & Plan:   Problem List Items Addressed This Visit    Constipation    Now taking Linzess per last visit with  Dr Liane Comber in September at .  Having nonbloody  diarrhea alternating with constipation on current daily dose.  Advised to alternate with Miralax.       Encounter for preventive health examination    comprehensive physical exam was done  .  During the course of the visit the patient was educated and counseled about appropriate screening and preventive services and screenings were discussed with regard to timing of initiation of cervical and breast cancer .  She is not sexually active and has had the Gardasil vaccine series as an adolescent.  She had screenign for hyperlipidemia last year and does not require annual lipid  screening.   nutrition counseling, skin cancer screening has been done, along with review of the age appropriate recommended immunizations.  Printed recommendations for health maintenance screenings was given.       Generalized anxiety disorder    Aggravated by husband's CVA and subsequent depression .  She has been intolerant of SSRI trials and prefers to use lorazepam. She has multipel somatic complaints that were individually addresssed today. The risks and benefits of benzodiazepine use were reviewed with patient today including  excessive sedation leading to respiratory depression,  impaired thinking/driving, and addiction.  Patient was advised to avoid concurrent use with alcohol, to use medication only as needed and not to share with others  .       Hyperlipidemia   Relevant Orders   Lipid panel (Completed)   Hypertension    Suspending hctz.  Advised to take losartan in the evening, not with the Linzess.      Impaired fasting blood sugar (Chronic)   Relevant Orders   Comprehensive metabolic panel (Completed)   Hemoglobin A1c (Completed)   Insomnia due to anxiety and fear    Improved with use of lorazepam. The risks and benefits of benzodiazepine use were reviewed with patient today including excessive sedation leading to respiratory depression,  impaired thinking/driving, and addiction.  Patient does not drink alcohol, and has been reminded to use medication only as needed and not to share with others  .          Other Visit Diagnoses    Fatigue, unspecified type    -  Primary   Relevant Orders   TSH (Completed)   CBC with Differential/Platelet (Completed)   Vitamin D deficiency       Relevant Orders   VITAMIN D 25 Hydroxy (Vit-D Deficiency, Fractures) (Completed)      I have changed Kim Sanchez's LORazepam. I am also having her maintain her inFLIXimab, linaclotide, aspirin EC, losartan, hydrochlorothiazide, and anastrozole.  Meds ordered this encounter  Medications  . LORazepam (ATIVAN) 0.5 MG tablet    Sig: Take 1 tablet (0.5 mg total) by mouth every 8 (eight) hours as needed. for anxiety    Dispense:  90 tablet    Refill:  5    Medications Discontinued During This Encounter  Medication Reason  . LORazepam (ATIVAN) 0.5 MG tablet Reorder  A total of 40 minutes was spent with patient more than half of which was spent in counseling patient on the above mentioned issues , reviewing and explaining recent labs and imaging studies done, and coordination of care.  Follow-up: No Follow-up on  file.   [name], MD

## 2016-04-01 NOTE — Patient Instructions (Addendum)
Suspend the hctz and just take losartan .  Separate it from the linzess by an hour  (take the losartan in the evening with dinner)   Try using linzess every other day  And miralax in between (not with losartan , separate by 2 hours )  You can use the furosemide as needed for fluid retention.   Try switching to decaffeinated tea in the afternoon   Health Maintenance, Female Introduction Adopting a healthy lifestyle and getting preventive care can go a long way to promote health and wellness. Talk with your health care provider about what schedule of regular examinations is right for you. This is a good chance for you to check in with your provider about disease prevention and staying healthy. In between checkups, there are plenty of things you can do on your own. Experts have done a lot of research about which lifestyle changes and preventive measures are most likely to keep you healthy. Ask your health care provider for more information. Weight and diet Eat a healthy diet  Be sure to include plenty of vegetables, fruits, low-fat dairy products, and lean protein.  Do not eat a lot of foods high in solid fats, added sugars, or salt.  Get regular exercise. This is one of the most important things you can do for your health.  Most adults should exercise for at least 150 minutes each week. The exercise should increase your heart rate and make you sweat (moderate-intensity exercise).  Most adults should also do strengthening exercises at least twice a week. This is in addition to the moderate-intensity exercise. Maintain a healthy weight  Body mass index (BMI) is a measurement that can be used to identify possible weight problems. It estimates body fat based on height and weight. Your health care provider can help determine your BMI and help you achieve or maintain a healthy weight.  For females 16 years of age and older:  A BMI below 18.5 is considered underweight.  A BMI of 18.5 to 24.9  is normal.  A BMI of 25 to 29.9 is considered overweight.  A BMI of 30 and above is considered obese. Watch levels of cholesterol and blood lipids  You should start having your blood tested for lipids and cholesterol at 75 years of age, then have this test every 5 years.  You may need to have your cholesterol levels checked more often if:  Your lipid or cholesterol levels are high.  You are older than 75 years of age.  You are at high risk for heart disease. Cancer screening Lung Cancer  Lung cancer screening is recommended for adults 19-33 years old who are at high risk for lung cancer because of a history of smoking.  A yearly low-dose CT scan of the lungs is recommended for people who:  Currently smoke.  Have quit within the past 15 years.  Have at least a 30-pack-year history of smoking. A pack year is smoking an average of one pack of cigarettes a day for 1 year.  Yearly screening should continue until it has been 15 years since you quit.  Yearly screening should stop if you develop a health problem that would prevent you from having lung cancer treatment. Breast Cancer  Practice breast self-awareness. This means understanding how your breasts normally appear and feel.  It also means doing regular breast self-exams. Let your health care provider know about any changes, no matter how small.  If you are in your 20s or 30s, you should  have a clinical breast exam (CBE) by a health care provider every 1-3 years as part of a regular health exam.  If you are 43 or older, have a CBE every year. Also consider having a breast X-ray (mammogram) every year.  If you have a family history of breast cancer, talk to your health care provider about genetic screening.  If you are at high risk for breast cancer, talk to your health care provider about having an MRI and a mammogram every year.  Breast cancer gene (BRCA) assessment is recommended for women who have family members with  BRCA-related cancers. BRCA-related cancers include:  Breast.  Ovarian.  Tubal.  Peritoneal cancers.  Results of the assessment will determine the need for genetic counseling and BRCA1 and BRCA2 testing. Cervical Cancer  Your health care provider may recommend that you be screened regularly for cancer of the pelvic organs (ovaries, uterus, and vagina). This screening involves a pelvic examination, including checking for microscopic changes to the surface of your cervix (Pap test). You may be encouraged to have this screening done every 3 years, beginning at age 84.  For women ages 23-65, health care providers may recommend pelvic exams and Pap testing every 3 years, or they may recommend the Pap and pelvic exam, combined with testing for human papilloma virus (HPV), every 5 years. Some types of HPV increase your risk of cervical cancer. Testing for HPV may also be done on women of any age with unclear Pap test results.  Other health care providers may not recommend any screening for nonpregnant women who are considered low risk for pelvic cancer and who do not have symptoms. Ask your health care provider if a screening pelvic exam is right for you.  If you have had past treatment for cervical cancer or a condition that could lead to cancer, you need Pap tests and screening for cancer for at least 20 years after your treatment. If Pap tests have been discontinued, your risk factors (such as having a new sexual partner) need to be reassessed to determine if screening should resume. Some women have medical problems that increase the chance of getting cervical cancer. In these cases, your health care provider may recommend more frequent screening and Pap tests. Colorectal Cancer  This type of cancer can be detected and often prevented.  Routine colorectal cancer screening usually begins at 75 years of age and continues through 75 years of age.  Your health care provider may recommend screening at  an earlier age if you have risk factors for colon cancer.  Your health care provider may also recommend using home test kits to check for hidden blood in the stool.  A small camera at the end of a tube can be used to examine your colon directly (sigmoidoscopy or colonoscopy). This is done to check for the earliest forms of colorectal cancer.  Routine screening usually begins at age 78.  Direct examination of the colon should be repeated every 5-10 years through 75 years of age. However, you may need to be screened more often if early forms of precancerous polyps or small growths are found. Skin Cancer  Check your skin from head to toe regularly.  Tell your health care provider about any new moles or changes in moles, especially if there is a change in a mole's shape or color.  Also tell your health care provider if you have a mole that is larger than the size of a pencil eraser.  Always use sunscreen.  Apply sunscreen liberally and repeatedly throughout the day.  Protect yourself by wearing long sleeves, pants, a wide-brimmed hat, and sunglasses whenever you are outside. Heart disease, diabetes, and high blood pressure  High blood pressure causes heart disease and increases the risk of stroke. High blood pressure is more likely to develop in:  People who have blood pressure in the high end of the normal range (130-139/85-89 mm Hg).  People who are overweight or obese.  People who are African American.  If you are 36-51 years of age, have your blood pressure checked every 3-5 years. If you are 82 years of age or older, have your blood pressure checked every year. You should have your blood pressure measured twice-once when you are at a hospital or clinic, and once when you are not at a hospital or clinic. Record the average of the two measurements. To check your blood pressure when you are not at a hospital or clinic, you can use:  An automated blood pressure machine at a pharmacy.  A  home blood pressure monitor.  If you are between 20 years and 53 years old, ask your health care provider if you should take aspirin to prevent strokes.  Have regular diabetes screenings. This involves taking a blood sample to check your fasting blood sugar level.  If you are at a normal weight and have a low risk for diabetes, have this test once every three years after 75 years of age.  If you are overweight and have a high risk for diabetes, consider being tested at a younger age or more often. Preventing infection Hepatitis B  If you have a higher risk for hepatitis B, you should be screened for this virus. You are considered at high risk for hepatitis B if:  You were born in a country where hepatitis B is common. Ask your health care provider which countries are considered high risk.  Your parents were born in a high-risk country, and you have not been immunized against hepatitis B (hepatitis B vaccine).  You have HIV or AIDS.  You use needles to inject street drugs.  You live with someone who has hepatitis B.  You have had sex with someone who has hepatitis B.  You get hemodialysis treatment.  You take certain medicines for conditions, including cancer, organ transplantation, and autoimmune conditions. Hepatitis C  Blood testing is recommended for:  Everyone born from [birth year] through 1965.  Anyone with known risk factors for hepatitis C. Sexually transmitted infections (STIs)  You should be screened for sexually transmitted infections (STIs) including gonorrhea and chlamydia if:  You are sexually active and are younger than 75 years of age.  You are older than 75 years of age and your health care provider tells you that you are at risk for this type of infection.  Your sexual activity has changed since you were last screened and you are at an increased risk for chlamydia or gonorrhea. Ask your health care provider if you are at risk.  If you do not have HIV, but are  at risk, it may be recommended that you take a prescription medicine daily to prevent HIV infection. This is called pre-exposure prophylaxis (PrEP). You are considered at risk if:  You are sexually active and do not regularly use condoms or know the HIV status of your partner(s).  You take drugs by injection.  You are sexually active with a partner who has HIV. Talk with your health care provider about whether you  are at high risk of being infected with HIV. If you choose to begin PrEP, you should first be tested for HIV. You should then be tested every 3 months for as long as you are taking PrEP. Pregnancy  If you are premenopausal and you may become pregnant, ask your health care provider about preconception counseling.  If you may become pregnant, take 400 to 800 micrograms (mcg) of folic acid every day.  If you want to prevent pregnancy, talk to your health care provider about birth control (contraception). Osteoporosis and menopause  Osteoporosis is a disease in which the bones lose minerals and strength with aging. This can result in serious bone fractures. Your risk for osteoporosis can be identified using a bone density scan.  If you are 59 years of age or older, or if you are at risk for osteoporosis and fractures, ask your health care provider if you should be screened.  Ask your health care provider whether you should take a calcium or vitamin D supplement to lower your risk for osteoporosis.  Menopause may have certain physical symptoms and risks.  Hormone replacement therapy may reduce some of these symptoms and risks. Talk to your health care provider about whether hormone replacement therapy is right for you. Follow these instructions at home:  Schedule regular health, dental, and eye exams.  Stay current with your immunizations.  Do not use any tobacco products including cigarettes, chewing tobacco, or electronic cigarettes.  If you are pregnant, do not drink  alcohol.  If you are breastfeeding, limit how much and how often you drink alcohol.  Limit alcohol intake to no more than 1 drink per day for nonpregnant women. One drink equals 12 ounces of beer, 5 ounces of wine, or 1 ounces of hard liquor.  Do not use street drugs.  Do not share needles.  Ask your health care provider for help if you need support or information about quitting drugs.  Tell your health care provider if you often feel depressed.  Tell your health care provider if you have ever been abused or do not feel safe at home. This information is not intended to replace advice given to you by your health care provider. Make sure you discuss any questions you have with your health care provider. Document Released: 11/03/2010 Document Revised: 09/26/2015 Document Reviewed: 01/22/2015  2017 Elsevier

## 2016-04-01 NOTE — Progress Notes (Signed)
Pre-visit discussion using our clinic review tool. No additional management support is needed unless otherwise documented below in the visit note.  

## 2016-04-04 ENCOUNTER — Encounter: Payer: Self-pay | Admitting: Internal Medicine

## 2016-04-04 DIAGNOSIS — Z0000 Encounter for general adult medical examination without abnormal findings: Secondary | ICD-10-CM | POA: Insufficient documentation

## 2016-04-04 NOTE — Assessment & Plan Note (Signed)
she has diet controlled DM.  She is not overweight and has a careful diet.  Will repeat in 6 months.  She has a documented intolerance to lisinopril and no proteinuria by recent assessment.    Lab Results  Component Value Date   HGBA1C 6.5 04/01/2016   Lab Results  Component Value Date   MICROALBUR 1.3 10/02/2015

## 2016-04-04 NOTE — Assessment & Plan Note (Addendum)
nonNow taking Linzess per last visit with  Dr Liane Comber in September at .  Having nonbloody  diarrhea alternating with constipation on current daily dose.  Advised to alternate with Miralax.

## 2016-04-04 NOTE — Assessment & Plan Note (Signed)
Suspending hctz.  Advised to take losartan in the evening, not with the Linzess.

## 2016-04-04 NOTE — Assessment & Plan Note (Signed)
Improved with use of lorazepam. The risks and benefits of benzodiazepine use were reviewed with patient today including excessive sedation leading to respiratory depression,  impaired thinking/driving, and addiction.  Patient does not drink alcohol, and has been reminded to use medication only as needed and not to share with others  .

## 2016-04-04 NOTE — Assessment & Plan Note (Signed)
comprehensive physical exam was done  .  During the course of the visit the patient was educated and counseled about appropriate screening and preventive services and screenings were discussed with regard to timing of initiation of cervical and breast cancer .  She is not sexually active and has had the Gardasil vaccine series as an adolescent.  She had screenign for hyperlipidemia last year and does not require annual lipid screening.   nutrition counseling, skin cancer screening has been done, along with review of the age appropriate recommended immunizations.  Printed recommendations for health maintenance screenings was given.

## 2016-04-04 NOTE — Assessment & Plan Note (Signed)
Aggravated by husband's CVA and subsequent depression .  She has been intolerant of SSRI trials and prefers to use lorazepam. She has multipel somatic complaints that were individually addresssed today. The risks and benefits of benzodiazepine use were reviewed with patient today including excessive sedation leading to respiratory depression,  impaired thinking/driving, and addiction.  Patient was advised to avoid concurrent use with alcohol, to use medication only as needed and not to share with others  .

## 2016-04-08 MED ORDER — ATORVASTATIN CALCIUM 10 MG PO TABS
10.0000 mg | ORAL_TABLET | Freq: Every day | ORAL | 3 refills | Status: DC
Start: 2016-04-08 — End: 2016-04-09

## 2016-04-09 MED ORDER — ATORVASTATIN CALCIUM 10 MG PO TABS: 10.0000 mg | ORAL_TABLET | Freq: Every day | ORAL | 3 refills | Status: DC

## 2016-04-09 NOTE — Addendum Note (Signed)
Addended by: Nanci Pina on: 04/09/2016 12:44 PM   Modules accepted: Orders

## 2016-04-14 ENCOUNTER — Ambulatory Visit (INDEPENDENT_AMBULATORY_CARE_PROVIDER_SITE_OTHER): Payer: Medicare Other

## 2016-04-14 VITALS — BP 142/82 | HR 73 | Temp 98.0°F | Resp 12 | Ht 61.0 in | Wt 138.0 lb

## 2016-04-14 DIAGNOSIS — Z0000 Encounter for general adult medical examination without abnormal findings: Secondary | ICD-10-CM

## 2016-04-14 NOTE — Patient Instructions (Addendum)
  Kim Sanchez , Thank you for taking time to come for your Medicare Wellness Visit. I appreciate your ongoing commitment to your health goals. Please review the following plan we discussed and let me know if I can assist you in the future.   Follow up with Dr. Derrel Nip as needed.  These are the goals we discussed: Goals    . Increase physical activity          Walk inside the the mall 40 minutes, 6 days weekly       This is a list of the screening recommended for you and due dates:  Health Maintenance  Topic Date Due  . Shingles Vaccine  05/03/2017*  . Hemoglobin A1C  09/29/2016  . Complete foot exam   04/01/2017  . Eye exam for diabetics  04/07/2017  . Mammogram  09/30/2017  . Tetanus Vaccine  07/24/2025  . Colon Cancer Screening  10/07/2025  . Flu Shot  Completed  . DEXA scan (bone density measurement)  Completed  . Pneumonia vaccines  Completed  *Topic was postponed. The date shown is not the original due date.

## 2016-04-14 NOTE — Progress Notes (Signed)
Subjective:   Kim Sanchez is a 75 y.o. female who presents for Medicare Annual (Subsequent) preventive examination.  Review of Systems:  No ROS.  Medicare Wellness Visit. Cardiac Risk Factors include: advanced age (>18mn, >>67women);hypertension;diabetes mellitus     Objective:     Vitals: BP (!) 142/82 (BP Location: Left Arm, Patient Position: Sitting, Cuff Size: Normal)   Pulse 73   Temp 98 F (36.7 C) (Oral)   Resp 12   Ht _0  (1.549 m)   Wt 138 lb (62.6 kg)   SpO2 96%   BMI 26.07 kg/m   Body mass index is 26.07 kg/m.   Tobacco History  Smoking Status  . Never Smoker  Smokeless Tobacco  . Never Used     Counseling given: Not Answered   Past Medical History:  Diagnosis Date  . Arthritis   . 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  . [facility]   . GERD (gastroesophageal reflux disease)   . History of blood transfusion 1960  . Hypertension    Past Surgical History:  Procedure Laterality Date  . BRAIN SURGERY  1980   breast biopsies, benign  . BREAST LUMPECTOMY Right May 2014   T1c, N0; ER/PR positive, HER-2/neu not overexpressing.  .Marland KitchenBREAST SURGERY  1980's   [place] [facility] . COLONOSCOPY  2017  . DILATION AND CURETTAGE OF UTERUS     Family History  Problem Relation Age of Onset  . Cancer Paternal Grandmother   . Heart disease Sister   . Diabetes Sister    History  Sexual Activity  . Sexual activity: Not Currently    Outpatient Encounter Prescriptions as of 04/14/2016  Medication Sig  . anastrozole (ARIMIDEX) 1 MG tablet Take 1 tablet (1 mg total) by mouth daily.  .Marland Kitchenaspirin EC 81 MG tablet Take 81 mg by mouth daily.  .Marland Kitchenatorvastatin (LIPITOR) 10 MG tablet Take 1 tablet (10 mg total) by mouth daily.  . hydrochlorothiazide (HYDRODIURIL) 12.5 MG tablet  Take 1 tablet (12.5 mg total) by mouth daily.  .Marland KitcheninFLIXimab (REMICADE) 100 MG injection Inject 1 mg into the vein every 8 (eight) weeks.  .Marland Kitchenlinaclotide (LINZESS) 145 MCG CAPS capsule Take 145 mcg by mouth daily before breakfast.  . LORazepam (ATIVAN) 0.5 MG tablet Take 1 tablet (0.5 mg total) by mouth every 8 (eight) hours as needed. for anxiety  . losartan (COZAAR) 50 MG tablet Take 1 tablet (50 mg total) by mouth daily.   No facility-administered encounter medications on file as of 04/14/2016.     Activities of Daily Living In your present state of health, do you have any difficulty performing the following activities: 04/14/2016  Hearing? Y  Vision? N  Difficulty concentrating or making decisions? N  Walking or climbing stairs? N  Dressing or bathing? N  Doing errands, shopping? N  Preparing Food and eating ? N  Using the Toilet? N  In the past six months, have you accidently leaked urine? N  Do you have problems with loss of bowel control? N  Managing your Medications? N  Managing your Finances? N  Housekeeping or managing your Housekeeping? N  Some recent data might be hidden    Patient Care Team: TCrecencio Mc MD as PCP - General (Internal Medicine) JRobert Bellow MD (General Surgery) THelene Kelp  Ether Griffins, MD (Internal Medicine)    Assessment:    This is a routine wellness examination for [place]. The goal of the wellness visit is to assist the patient how to close the gaps in care and create a preventative care plan for the patient.   Osteoporosis risk reviewed.  Medications reviewed; taking without issues or barriers.  Safety issues reviewed; lives with husband. Smoke detectors in the home. No firearms in the home. Wears seatbelts when driving or riding with others. No violence in the home.  No identified risk were noted; The patient was oriented x 3; appropriate in dress and manner and no objective failures at ADL's or IADL's.   BMI; discussed the importance  of a healthy diet, water intake and exercise. Educational material provided.  ZOSTAVAX vaccine postponed; not a candidate due to remicade infusion.   Health maintenance gaps; closed.  Patient Concerns: None at this time. Follow up with PCP as needed.  Exercise Activities and Dietary recommendations Current Exercise Habits: Home exercise routine, Time (Minutes): 20, Frequency (Times/Week): 4, Weekly Exercise (Minutes/Week): 80, Intensity: Moderate  Goals    . Increase physical activity          Walk inside the the mall 40 minutes, 6 days weekly      Fall Risk Fall Risk  04/14/2016 02/06/2016 01/15/2016 05/31/2015 09/12/2014  Falls in the past year? No Yes Yes No No  Number falls in past yr: - 1 1 - -  Injury with Fall? - No No - -   Depression Screen PHQ 2/9 Scores 04/14/2016 02/06/2016 01/15/2016 05/31/2015  PHQ - 2 Score 0 0 0 0     Cognitive Function MMSE - Mini Mental State Exam 04/14/2016  Orientation to time 5  Orientation to Place 5  Registration 3  Attention/ Calculation 5  Recall 3  Language- name 2 objects 2  Language- repeat 1  Language- follow 3 step command 3  Language- read & follow direction 1  Write a sentence 1  Copy design 1  Total score 30        Immunization History  Administered Date(s) Administered  . Influenza Split 02/01/2013  . Influenza Whole 01/17/2012  . Influenza, High Dose Seasonal PF 02/02/2016  . Influenza,inj,Quad PF,36+ Mos 03/20/2015  . Influenza-Unspecified 03/05/2014  . Pneumococcal Conjugate-13 07/24/2014  . Pneumococcal Polysaccharide-23 12/16/2012  . Td 03/19/2006  . Tdap 07/25/2015   Screening Tests Health Maintenance  Topic Date Due  . ZOSTAVAX  05/03/2017 (Originally 04/20/2001)  . HEMOGLOBIN A1C  09/29/2016  . FOOT EXAM  04/01/2017  . OPHTHALMOLOGY EXAM  04/07/2017  . MAMMOGRAM  09/30/2017  . TETANUS/TDAP  07/24/2025  . COLONOSCOPY  10/07/2025  . INFLUENZA VACCINE  Completed  . DEXA SCAN  Completed  . PNA  vac Low Risk Adult  Completed      Plan:    End of life planning; Advance aging; Advanced directives discussed. Copy of current HCPOA/Living Will requested.  Medicare Attestation I have personally reviewed: The patient's medical and social history Their use of alcohol, tobacco or illicit drugs Their current medications and supplements The patient's functional ability including ADLs,fall risks, home safety risks, cognitive, and hearing and visual impairment Diet and physical activities Evidence for depression   The patient's weight, height, BMI, and visual acuity have been recorded in the chart.  I have made referrals and provided education to the patient based on review of the above and I have provided the patient with a written personalized care plan  for preventive services.    During the course of the visit the patient was educated and counseled about the following appropriate screening and preventive services:   Vaccines to include Pneumoccal, Influenza, Hepatitis B, Td, Zostavax, HCV  Electrocardiogram  Cardiovascular Disease  Colorectal cancer screening  Bone density screening  Diabetes screening  Glaucoma screening  Mammography/PAP  Nutrition counseling   Patient Instructions (the written plan) was given to the patient.   Varney Biles, LPN  02/72/[id number]   Reviewed above information.  Agree with plan.  Dr Nicki Reaper

## 2016-04-21 DIAGNOSIS — J069 Acute upper respiratory infection, unspecified: Secondary | ICD-10-CM | POA: Diagnosis not present

## 2016-04-21 DIAGNOSIS — Z8679 Personal history of other diseases of the circulatory system: Secondary | ICD-10-CM | POA: Diagnosis not present

## 2016-04-24 DIAGNOSIS — M791 Myalgia: Secondary | ICD-10-CM | POA: Diagnosis not present

## 2016-04-24 DIAGNOSIS — M50321 Other cervical disc degeneration at C4-C5 level: Secondary | ICD-10-CM | POA: Diagnosis not present

## 2016-04-24 DIAGNOSIS — M542 Cervicalgia: Secondary | ICD-10-CM | POA: Diagnosis not present

## 2016-04-24 DIAGNOSIS — M5412 Radiculopathy, cervical region: Secondary | ICD-10-CM | POA: Diagnosis not present

## 2016-04-24 DIAGNOSIS — M25512 Pain in left shoulder: Secondary | ICD-10-CM | POA: Diagnosis not present

## 2016-04-24 DIAGNOSIS — M62830 Muscle spasm of back: Secondary | ICD-10-CM | POA: Diagnosis not present

## 2016-04-24 DIAGNOSIS — M5413 Radiculopathy, cervicothoracic region: Secondary | ICD-10-CM | POA: Diagnosis not present

## 2016-04-24 DIAGNOSIS — M9901 Segmental and somatic dysfunction of cervical region: Secondary | ICD-10-CM | POA: Diagnosis not present

## 2016-04-24 DIAGNOSIS — M50322 Other cervical disc degeneration at C5-C6 level: Secondary | ICD-10-CM | POA: Diagnosis not present

## 2016-04-30 ENCOUNTER — Ambulatory Visit (INDEPENDENT_AMBULATORY_CARE_PROVIDER_SITE_OTHER): Payer: Medicare Other | Admitting: Family Medicine

## 2016-04-30 ENCOUNTER — Encounter: Payer: Self-pay | Admitting: Family Medicine

## 2016-04-30 DIAGNOSIS — J069 Acute upper respiratory infection, unspecified: Secondary | ICD-10-CM | POA: Insufficient documentation

## 2016-04-30 MED ORDER — LORATADINE 10 MG PO TABS: 10.0000 mg | ORAL_TABLET | Freq: Every day | ORAL | 0 refills | Status: DC

## 2016-04-30 MED ORDER — DOXYCYCLINE HYCLATE 100 MG PO TABS: 100.0000 mg | ORAL_TABLET | Freq: Two times a day (BID) | ORAL | 0 refills | Status: DC

## 2016-04-30 MED ORDER — HYDROCOD POLST-CPM POLST ER 10-8 MG/5ML PO SUER: 5.0000 mL | Freq: Two times a day (BID) | ORAL | 0 refills | Status: DC | PRN

## 2016-04-30 NOTE — Assessment & Plan Note (Signed)
New acute problem. Treating with Claritin and Tussionex. Given immunosuppression, patient given antibiotic if she fails to improve or worsens.

## 2016-04-30 NOTE — Progress Notes (Signed)
Subjective:  Patient ID: Kim Sanchez, female    DOB: April 29, 1941  Age: 75 y.o. MRN: [id number]  CC: Congestion, cough, fatigue  HPI:  75 year old female with Crohn's disease presents with the above complaints.  Patient reports that she has been sick since 12/16. She has been experiencing congestion, cough, fatigue/weakness.  ? Wheezing. She was treated with Azithromycin on 12/19 [facility]). She has had no improvement. She continues to have symptoms. She has finished with the antibiotic and is taking OTC Delsym and Ibuprofen with minimal relief. No recent fever. No other complaints or concerns at this time.  Social Hx   Social History   Social History  . Marital status: Married    Spouse name: N/A  . Number of children: N/A  . Years of education: N/A   Social History Main Topics  . Smoking status: Never Smoker  . Smokeless tobacco: Never Used  . Alcohol use 0.6 oz/week    1 Glasses of wine per week     Comment: OCC  . Drug use: No  . Sexual activity: Not Currently   Other Topics Concern  . None   Social History Narrative  . None   Review of Systems  HENT: Positive for congestion and sinus pressure.   Respiratory: Positive for cough and wheezing.    Objective:  BP (!) 167/75   Pulse 74   Temp 98.4 F (36.9 C) (Oral)   Resp 14   Wt 137 lb (62.1 kg)   SpO2 97%   BMI 25.89 kg/m   BP/Weight 04/30/2016 04/14/2016 28/31/5176  Systolic BP 160 737 106  Diastolic BP 75 82 82  Wt. (Lbs) 137 138 137.25  BMI 25.89 26.07 25.93   Physical Exam  Constitutional: She appears well-developed. No distress.  HENT:  Head: Normocephalic and atraumatic.  Mouth/Throat: Oropharynx is clear and moist.  Normal TM's bilaterally.   Cardiovascular: Normal rate and regular rhythm.   2/6 Systolic murmur.   Pulmonary/Chest: Effort normal and breath sounds normal.  Vitals reviewed.  Lab Results  Component Value Date   WBC 5.7 04/01/2016   HGB 14.3 04/01/2016   HCT 42.0  04/01/2016   PLT 240.0 04/01/2016   GLUCOSE 104 (H) 04/01/2016   CHOL 304 (H) 04/01/2016   TRIG 106.0 04/01/2016   HDL 87.30 04/01/2016   LDLDIRECT 151.5 10/03/2013   LDLCALC 196 (H) 04/01/2016   ALT 14 04/01/2016   AST 17 04/01/2016   NA 140 04/01/2016   K 4.0 04/01/2016   CL 101 04/01/2016   CREATININE 0.70 04/01/2016   BUN 15 04/01/2016   CO2 29 04/01/2016   TSH 2.15 04/01/2016   HGBA1C 6.5 04/01/2016   MICROALBUR 1.3 10/02/2015    Assessment & Plan:   Problem List Items Addressed This Visit    URI with cough and congestion    New acute problem. Treating with Claritin and Tussionex. Given immunosuppression, patient given antibiotic if she fails to improve or worsens.         Meds ordered this encounter  Medications  . loratadine (CLARITIN) 10 MG tablet    Sig: Take 1 tablet (10 mg total) by mouth daily.    Dispense:  30 tablet    Refill:  0  . doxycycline (VIBRA-TABS) 100 MG tablet    Sig: Take 1 tablet (100 mg total) by mouth 2 (two) times daily.    Dispense:  20 tablet    Refill:  0  . chlorpheniramine-HYDROcodone (TUSSIONEX PENNKINETIC  ER) 10-8 MG/5ML SUER    Sig: Take 5 mLs by mouth every 12 (twelve) hours as needed.    Dispense:  115 mL    Refill:  0    Follow-up: PRN  [place]

## 2016-04-30 NOTE — Progress Notes (Signed)
Pre visit review using our clinic review tool, if applicable. No additional management support is needed unless otherwise documented below in the visit note. 

## 2016-04-30 NOTE — Patient Instructions (Signed)
Use the cough medication as needed.  Take the antibiotic if you fail to improve or worsen (this is likely viral).  Take care  Dr. Lacinda Axon

## 2016-05-12 DIAGNOSIS — M25512 Pain in left shoulder: Secondary | ICD-10-CM | POA: Diagnosis not present

## 2016-05-12 DIAGNOSIS — M50322 Other cervical disc degeneration at C5-C6 level: Secondary | ICD-10-CM | POA: Diagnosis not present

## 2016-05-12 DIAGNOSIS — M542 Cervicalgia: Secondary | ICD-10-CM | POA: Diagnosis not present

## 2016-05-12 DIAGNOSIS — M62830 Muscle spasm of back: Secondary | ICD-10-CM | POA: Diagnosis not present

## 2016-05-12 DIAGNOSIS — M50321 Other cervical disc degeneration at C4-C5 level: Secondary | ICD-10-CM | POA: Diagnosis not present

## 2016-05-12 DIAGNOSIS — M5413 Radiculopathy, cervicothoracic region: Secondary | ICD-10-CM | POA: Diagnosis not present

## 2016-05-12 DIAGNOSIS — M791 Myalgia: Secondary | ICD-10-CM | POA: Diagnosis not present

## 2016-05-12 DIAGNOSIS — M5412 Radiculopathy, cervical region: Secondary | ICD-10-CM | POA: Diagnosis not present

## 2016-05-12 DIAGNOSIS — M9901 Segmental and somatic dysfunction of cervical region: Secondary | ICD-10-CM | POA: Diagnosis not present

## 2016-05-13 DIAGNOSIS — M5412 Radiculopathy, cervical region: Secondary | ICD-10-CM | POA: Diagnosis not present

## 2016-05-13 DIAGNOSIS — M50322 Other cervical disc degeneration at C5-C6 level: Secondary | ICD-10-CM | POA: Diagnosis not present

## 2016-05-13 DIAGNOSIS — M25512 Pain in left shoulder: Secondary | ICD-10-CM | POA: Diagnosis not present

## 2016-05-13 DIAGNOSIS — M62830 Muscle spasm of back: Secondary | ICD-10-CM | POA: Diagnosis not present

## 2016-05-13 DIAGNOSIS — M791 Myalgia: Secondary | ICD-10-CM | POA: Diagnosis not present

## 2016-05-13 DIAGNOSIS — M50321 Other cervical disc degeneration at C4-C5 level: Secondary | ICD-10-CM | POA: Diagnosis not present

## 2016-05-13 DIAGNOSIS — M9901 Segmental and somatic dysfunction of cervical region: Secondary | ICD-10-CM | POA: Diagnosis not present

## 2016-05-13 DIAGNOSIS — M5413 Radiculopathy, cervicothoracic region: Secondary | ICD-10-CM | POA: Diagnosis not present

## 2016-05-13 DIAGNOSIS — M542 Cervicalgia: Secondary | ICD-10-CM | POA: Diagnosis not present

## 2016-05-14 DIAGNOSIS — M50322 Other cervical disc degeneration at C5-C6 level: Secondary | ICD-10-CM | POA: Diagnosis not present

## 2016-05-14 DIAGNOSIS — M5412 Radiculopathy, cervical region: Secondary | ICD-10-CM | POA: Diagnosis not present

## 2016-05-14 DIAGNOSIS — M542 Cervicalgia: Secondary | ICD-10-CM | POA: Diagnosis not present

## 2016-05-14 DIAGNOSIS — M50321 Other cervical disc degeneration at C4-C5 level: Secondary | ICD-10-CM | POA: Diagnosis not present

## 2016-05-14 DIAGNOSIS — M25512 Pain in left shoulder: Secondary | ICD-10-CM | POA: Diagnosis not present

## 2016-05-14 DIAGNOSIS — M9901 Segmental and somatic dysfunction of cervical region: Secondary | ICD-10-CM | POA: Diagnosis not present

## 2016-05-14 DIAGNOSIS — M791 Myalgia: Secondary | ICD-10-CM | POA: Diagnosis not present

## 2016-05-14 DIAGNOSIS — M62830 Muscle spasm of back: Secondary | ICD-10-CM | POA: Diagnosis not present

## 2016-05-14 DIAGNOSIS — M5413 Radiculopathy, cervicothoracic region: Secondary | ICD-10-CM | POA: Diagnosis not present

## 2016-05-15 DIAGNOSIS — M9901 Segmental and somatic dysfunction of cervical region: Secondary | ICD-10-CM | POA: Diagnosis not present

## 2016-05-15 DIAGNOSIS — M791 Myalgia: Secondary | ICD-10-CM | POA: Diagnosis not present

## 2016-05-15 DIAGNOSIS — M5412 Radiculopathy, cervical region: Secondary | ICD-10-CM | POA: Diagnosis not present

## 2016-05-15 DIAGNOSIS — M542 Cervicalgia: Secondary | ICD-10-CM | POA: Diagnosis not present

## 2016-05-15 DIAGNOSIS — M25512 Pain in left shoulder: Secondary | ICD-10-CM | POA: Diagnosis not present

## 2016-05-15 DIAGNOSIS — M62830 Muscle spasm of back: Secondary | ICD-10-CM | POA: Diagnosis not present

## 2016-05-15 DIAGNOSIS — M50321 Other cervical disc degeneration at C4-C5 level: Secondary | ICD-10-CM | POA: Diagnosis not present

## 2016-05-15 DIAGNOSIS — M5413 Radiculopathy, cervicothoracic region: Secondary | ICD-10-CM | POA: Diagnosis not present

## 2016-05-15 DIAGNOSIS — M50322 Other cervical disc degeneration at C5-C6 level: Secondary | ICD-10-CM | POA: Diagnosis not present

## 2016-05-18 DIAGNOSIS — M542 Cervicalgia: Secondary | ICD-10-CM | POA: Diagnosis not present

## 2016-05-18 DIAGNOSIS — M791 Myalgia: Secondary | ICD-10-CM | POA: Diagnosis not present

## 2016-05-18 DIAGNOSIS — M5412 Radiculopathy, cervical region: Secondary | ICD-10-CM | POA: Diagnosis not present

## 2016-05-18 DIAGNOSIS — M25512 Pain in left shoulder: Secondary | ICD-10-CM | POA: Diagnosis not present

## 2016-05-18 DIAGNOSIS — M9901 Segmental and somatic dysfunction of cervical region: Secondary | ICD-10-CM | POA: Diagnosis not present

## 2016-05-18 DIAGNOSIS — M50322 Other cervical disc degeneration at C5-C6 level: Secondary | ICD-10-CM | POA: Diagnosis not present

## 2016-05-18 DIAGNOSIS — M5413 Radiculopathy, cervicothoracic region: Secondary | ICD-10-CM | POA: Diagnosis not present

## 2016-05-18 DIAGNOSIS — M62830 Muscle spasm of back: Secondary | ICD-10-CM | POA: Diagnosis not present

## 2016-05-18 DIAGNOSIS — M50321 Other cervical disc degeneration at C4-C5 level: Secondary | ICD-10-CM | POA: Diagnosis not present

## 2016-05-19 DIAGNOSIS — M9901 Segmental and somatic dysfunction of cervical region: Secondary | ICD-10-CM | POA: Diagnosis not present

## 2016-05-19 DIAGNOSIS — M25512 Pain in left shoulder: Secondary | ICD-10-CM | POA: Diagnosis not present

## 2016-05-19 DIAGNOSIS — M50322 Other cervical disc degeneration at C5-C6 level: Secondary | ICD-10-CM | POA: Diagnosis not present

## 2016-05-19 DIAGNOSIS — M542 Cervicalgia: Secondary | ICD-10-CM | POA: Diagnosis not present

## 2016-05-19 DIAGNOSIS — M62830 Muscle spasm of back: Secondary | ICD-10-CM | POA: Diagnosis not present

## 2016-05-19 DIAGNOSIS — M5413 Radiculopathy, cervicothoracic region: Secondary | ICD-10-CM | POA: Diagnosis not present

## 2016-05-19 DIAGNOSIS — M791 Myalgia: Secondary | ICD-10-CM | POA: Diagnosis not present

## 2016-05-19 DIAGNOSIS — M50321 Other cervical disc degeneration at C4-C5 level: Secondary | ICD-10-CM | POA: Diagnosis not present

## 2016-05-19 DIAGNOSIS — M5412 Radiculopathy, cervical region: Secondary | ICD-10-CM | POA: Diagnosis not present

## 2016-05-25 DIAGNOSIS — M9901 Segmental and somatic dysfunction of cervical region: Secondary | ICD-10-CM | POA: Diagnosis not present

## 2016-05-25 DIAGNOSIS — M5412 Radiculopathy, cervical region: Secondary | ICD-10-CM | POA: Diagnosis not present

## 2016-05-25 DIAGNOSIS — M50322 Other cervical disc degeneration at C5-C6 level: Secondary | ICD-10-CM | POA: Diagnosis not present

## 2016-05-25 DIAGNOSIS — M5413 Radiculopathy, cervicothoracic region: Secondary | ICD-10-CM | POA: Diagnosis not present

## 2016-05-25 DIAGNOSIS — M542 Cervicalgia: Secondary | ICD-10-CM | POA: Diagnosis not present

## 2016-05-25 DIAGNOSIS — M50321 Other cervical disc degeneration at C4-C5 level: Secondary | ICD-10-CM | POA: Diagnosis not present

## 2016-05-25 DIAGNOSIS — M62830 Muscle spasm of back: Secondary | ICD-10-CM | POA: Diagnosis not present

## 2016-05-25 DIAGNOSIS — M25512 Pain in left shoulder: Secondary | ICD-10-CM | POA: Diagnosis not present

## 2016-05-25 DIAGNOSIS — M791 Myalgia: Secondary | ICD-10-CM | POA: Diagnosis not present

## 2016-05-27 ENCOUNTER — Telehealth: Payer: Self-pay | Admitting: *Deleted

## 2016-05-27 ENCOUNTER — Telehealth: Payer: Self-pay | Admitting: Internal Medicine

## 2016-05-27 DIAGNOSIS — E7800 Pure hypercholesterolemia, unspecified: Secondary | ICD-10-CM

## 2016-05-27 NOTE — Telephone Encounter (Signed)
FASTING LABS ORDERED.

## 2016-05-27 NOTE — Telephone Encounter (Signed)
LMOM for patient to call back.

## 2016-05-27 NOTE — Telephone Encounter (Signed)
Pt wants to know if she should have labs done before her appt on Friday. Please advise, thank you!  Call pt @ [phone]

## 2016-05-27 NOTE — Telephone Encounter (Signed)
Patient notified, Lab scheduled 05/28/16

## 2016-05-27 NOTE — Telephone Encounter (Signed)
Can pt reschedule her appt to Feb 5th at 1630

## 2016-05-27 NOTE — Telephone Encounter (Signed)
Please order labs for patient. Appt 05/29/2016, patient will like to have labs done today. She has only had coffee today

## 2016-05-28 ENCOUNTER — Other Ambulatory Visit: Payer: Medicare Other

## 2016-05-28 DIAGNOSIS — K5080 Crohn's disease of both small and large intestine without complications: Secondary | ICD-10-CM | POA: Diagnosis not present

## 2016-05-29 ENCOUNTER — Ambulatory Visit (INDEPENDENT_AMBULATORY_CARE_PROVIDER_SITE_OTHER): Payer: Medicare Other | Admitting: Internal Medicine

## 2016-05-29 ENCOUNTER — Encounter: Payer: Self-pay | Admitting: Internal Medicine

## 2016-05-29 DIAGNOSIS — E119 Type 2 diabetes mellitus without complications: Secondary | ICD-10-CM

## 2016-05-29 DIAGNOSIS — E7800 Pure hypercholesterolemia, unspecified: Secondary | ICD-10-CM

## 2016-05-29 DIAGNOSIS — I10 Essential (primary) hypertension: Secondary | ICD-10-CM | POA: Diagnosis not present

## 2016-05-29 DIAGNOSIS — Z9225 Personal history of immunosupression therapy: Secondary | ICD-10-CM

## 2016-05-29 LAB — COMPREHENSIVE METABOLIC PANEL
ALBUMIN: 4.3 g/dL (ref 3.5–5.2)
ALT: 12 U/L (ref 0–35)
AST: 16 U/L (ref 0–37)
Alkaline Phosphatase: 69 U/L (ref 39–117)
BUN: 11 mg/dL (ref 6–23)
CALCIUM: 9.5 mg/dL (ref 8.4–10.5)
CO2: 29 mEq/L (ref 19–32)
CREATININE: 0.69 mg/dL (ref 0.40–1.20)
Chloride: 105 mEq/L (ref 96–112)
GFR: 88.13 mL/min (ref >60.00)
Glucose, Bld: 93 mg/dL (ref 70–99)
POTASSIUM: 3.8 meq/L (ref 3.5–5.1)
Sodium: 142 mEq/L (ref 135–145)
Total Bilirubin: 0.8 mg/dL (ref 0.2–1.2)
Total Protein: 7.7 g/dL (ref 6.0–8.3)

## 2016-05-29 LAB — LIPID PANEL
CHOLESTEROL: 176 mg/dL (ref 0–200)
HDL: 78.2 mg/dL (ref >39.00)
LDL CALC: 84 mg/dL (ref 0–99)
NonHDL: 97.76
TRIGLYCERIDES: 67 mg/dL (ref 0.0–149.0)
Total CHOL/HDL Ratio: 2
VLDL: 13.4 mg/dL (ref 0.0–40.0)

## 2016-05-29 NOTE — Telephone Encounter (Signed)
Patient advised of below and appointment already scheduled.

## 2016-05-29 NOTE — Telephone Encounter (Signed)
Please advise 

## 2016-05-29 NOTE — Telephone Encounter (Signed)
If she had her labs today she does not need to be seen until late May,  As directed on her follow up box

## 2016-05-29 NOTE — Progress Notes (Signed)
Subjective:  Patient ID: Kim Sanchez, female    DOB: 14-Nov-1940  Age: 76 y.o. MRN: [id number]  CC: Diagnoses of Pure hypercholesterolemia, History of immunosuppression therapy, Essential hypertension, and Diabetes mellitus with no complication ([place]) were pertinent to this visit.  HPI Kim Sanchez presents for folow up on hypertension and hyperlipidemia  Fasting today  Dec 16 had viral URI ,  Slight cough, no fevers or body aches.  Feeling better now.  Still very anxious, not sleeping well , worried her husband is going to die in his sleep   bp has been labile per home readings this am was 160/82.  Yesterday 176/99  Lowest reading 138/76 and one reading of 128/70 pulse 86 .  Pulse runs from 65 to 80  Home meter reads  7 pts lower than office meters per recent visit.   Taking atorvastatin 5 mg every other day and tolerating it .    Had remicaide yesterday,  Fatigued today   Had eye exam last month.     Outpatient Medications Prior to Visit  Medication Sig Dispense Refill  . anastrozole (ARIMIDEX) 1 MG tablet Take 1 tablet (1 mg total) by mouth daily. 90 tablet 1  . aspirin EC 81 MG tablet Take 81 mg by mouth daily.    Marland Kitchen atorvastatin (LIPITOR) 10 MG tablet Take 1 tablet (10 mg total) by mouth daily. 30 tablet 3  . inFLIXimab (REMICADE) 100 MG injection Inject 1 mg into the vein every 8 (eight) weeks.    Marland Kitchen loratadine (CLARITIN) 10 MG tablet Take 1 tablet (10 mg total) by mouth daily. 30 tablet 0  . LORazepam (ATIVAN) 0.5 MG tablet Take 1 tablet (0.5 mg total) by mouth every 8 (eight) hours as needed. for anxiety 90 tablet 5  . losartan (COZAAR) 50 MG tablet Take 1 tablet (50 mg total) by mouth daily. 90 tablet 3  . chlorpheniramine-HYDROcodone (TUSSIONEX PENNKINETIC ER) 10-8 MG/5ML SUER Take 5 mLs by mouth every 12 (twelve) hours as needed. 115 mL 0  . doxycycline (VIBRA-TABS) 100 MG tablet Take 1 tablet (100 mg total) by mouth 2 (two) times daily. 20 tablet 0  . linaclotide  (LINZESS) 145 MCG CAPS capsule Take 145 mcg by mouth daily before breakfast.     No facility-administered medications prior to visit.     Review of Systems;  Patient denies headache, fevers, malaise, unintentional weight loss, skin rash, eye pain, sinus congestion and sinus pain, sore throat, dysphagia,  hemoptysis , cough, dyspnea, wheezing, chest pain, palpitations, orthopnea, edema, abdominal pain, nausea, melena, diarrhea, constipation, flank pain, dysuria, hematuria, urinary  Frequency, nocturia, numbness, tingling, seizures,  Focal weakness, Loss of consciousness,  Tremor, insomnia, depression, anxiety, and suicidal ideation.      Objective:  BP (!) 168/82   Pulse 72   Temp 97.9 F (36.6 C) (Oral)   Resp 16   Ht 5' 1"  (1.549 m)   Wt 135 lb 4 oz (61.3 kg)   SpO2 98%   BMI 25.56 kg/m   BP Readings from Last 3 Encounters:  05/29/16 (!) 168/82  04/30/16 (!) 167/75  04/14/16 (!) 142/82    Wt Readings from Last 3 Encounters:  05/29/16 135 lb 4 oz (61.3 kg)  04/30/16 137 lb (62.1 kg)  04/14/16 138 lb (62.6 kg)    General appearance: alert, cooperative and appears stated age Ears: normal TM's and external ear canals both ears Throat: lips, mucosa, and tongue normal; teeth and gums normal Neck: no adenopathy,  no carotid bruit, supple, symmetrical, trachea midline and thyroid not enlarged, symmetric, no tenderness/mass/nodules Back: symmetric, no curvature. ROM normal. No CVA tenderness. Lungs: clear to auscultation bilaterally Heart: regular rate and rhythm, S1, S2 normal, no murmur, click, rub or gallop Abdomen: soft, non-tender; bowel sounds normal; no masses,  no organomegaly Pulses: 2+ and symmetric Skin: Skin color, texture, turgor normal. No rashes or lesions Lymph nodes: Cervical, supraclavicular, and axillary nodes normal.  Lab Results  Component Value Date   HGBA1C 6.5 04/01/2016   HGBA1C 6.3 10/02/2015   HGBA1C 6.4 03/15/2015    Lab Results  Component  Value Date   CREATININE 0.69 05/29/2016   CREATININE 0.70 04/01/2016   CREATININE 0.66 01/15/2016    Lab Results  Component Value Date   WBC 5.7 04/01/2016   HGB 14.3 04/01/2016   HCT 42.0 04/01/2016   PLT 240.0 04/01/2016   GLUCOSE 93 05/29/2016   CHOL 176 05/29/2016   TRIG 67.0 05/29/2016   HDL 78.20 05/29/2016   LDLDIRECT 151.5 10/03/2013   LDLCALC 84 05/29/2016   ALT 12 05/29/2016   AST 16 05/29/2016   NA 142 05/29/2016   K 3.8 05/29/2016   CL 105 05/29/2016   CREATININE 0.69 05/29/2016   BUN 11 05/29/2016   CO2 29 05/29/2016   TSH 2.15 04/01/2016   HGBA1C 6.5 04/01/2016   MICROALBUR 1.3 10/02/2015    Dg Bone Density  Result Date: 01/27/2016 EXAM: DUAL X-RAY ABSORPTIOMETRY (DXA) FOR BONE MINERAL DENSITY IMPRESSION: Dear Dr. Delight Hoh, Your patient Neola Worrall completed a BMD test on 01/27/2016 using the [place] (analysis version: 14.10) manufactured by [employer]. The following summarizes the results of our evaluation. PATIENT BIOGRAPHICAL: Name: Tmya, Wigington I Patient ID: [id number] Birth Date: 1940/07/28 Height: 62.0 in. Gender: Female Exam Date: 01/27/2016 Weight: 137.4 lbs. Indications: Advanced Age, Breast CA, Caucasian, Family Hx of Osteoporosis, High Risk Meds, History of Breast Cancer, Parent Hip Fracture, Postmenopausal Fractures: Treatments: anastrozole, calcium, vitamin D ASSESSMENT: The BMD measured at AP Spine L1-L4 is 1.009 g/cm2 with a T-score of -1.5. This patient is considered osteopenic according to [facility]) criteria. Patient is not a candidate for FRAX due to Anastrozole. Site Region Measured Measured WHO Young Adult BMD Date       Age      Classification T-score AP Spine L1-L4 01/27/2016 74.7 Osteopenia -1.5 1.009 g/cm2 AP Spine L1-L4 01/23/2014 72.7 Normal -1.0 1.070 g/cm2 AP Spine L1-L4 01/17/2013 71.7 Normal -0.6 1.120 g/cm2 DualFemur Neck Right 01/27/2016 74.7 Osteopenia -1.2 0.876 g/cm2 DualFemur Neck Right  01/23/2014 72.7 Normal -0.8 0.922 g/cm2 DualFemur Neck Right 01/17/2013 71.7 Normal -1.0 0.905 g/cm2 World Health Organization [facility]) criteria for post-menopausal, Caucasian Women: Normal:       T-score at or above -1 SD Osteopenia:   T-score between -1 and -2.5 SD Osteoporosis: T-score at or below -2.5 SD RECOMMENDATIONS: [place] recommends that FDA-approved medical therapies be considered in postmenopausal women and men age 32 or older with a: 1. Hip or vertebral (clinical or morphometric) fracture. 2. T-score of < -2.5 at the spine or hip. 3. Ten-year fracture probability by FRAX of 3% or greater for hip fracture or 20% or greater for major osteoporotic fracture. All treatment decisions require clinical judgment and consideration of individual patient factors, including patient preferences, co-morbidities, previous drug use, risk factors not captured in the FRAX model (e.g. falls, vitamin D deficiency, increased bone turnover, interval significant decline in bone density) and possible under -  or over-estimation of fracture risk by FRAX. All patients should ensure an adequate intake of dietary calcium (1200 mg/d) and vitamin D (800 IU daily) unless contraindicated. FOLLOW-UP: People with diagnosed cases of osteoporosis or at high risk for fracture should have regular bone mineral density tests. For patients eligible for Medicare, routine testing is allowed once every 2 years. The testing frequency can be increased to one year for patients who have rapidly progressing disease, those who are receiving or discontinuing medical therapy to restore bone mass, or have additional risk factors. I have reviewed this report, and agree with the above findings. [facility] Electronically Signed   By: Lahoma Crocker M.D.   On: 01/27/2016 11:30    Assessment & Plan:   Problem List Items Addressed This Visit    Diabetes mellitus with no complication ([place])    she has diet controlled DM.  She is  not overweight and has a careful diet.  Will repeat in 6 months.  She has a documented intolerance to lisinopril and no proteinuria by recent assessment.    Lab Results  Component Value Date   HGBA1C 6.5 04/01/2016   Lab Results  Component Value Date   MICROALBUR 1.3 10/02/2015             History of immunosuppression therapy    Receives remicaide, last infusion yeterday.  Advised to discuss getting shingles vaccine with dr Jefm Bryant.       Hyperlipidemia    Well controlled on current every other day statin therapy.   Liver enzymes are normal , no changes today.  Lab Results  Component Value Date   CHOL 176 05/29/2016   HDL 78.20 05/29/2016   LDLCALC 84 05/29/2016   LDLDIRECT 151.5 10/03/2013   TRIG 67.0 05/29/2016   CHOLHDL 2 05/29/2016   Lab Results  Component Value Date   ALT 12 05/29/2016   AST 16 05/29/2016   ALKPHOS 69 05/29/2016   BILITOT 0.8 05/29/2016         Hypertension    Increased losartan to 75 mg today for persistent elevation by home readings          I have discontinued Ms. Markos's linaclotide, doxycycline, and chlorpheniramine-HYDROcodone. I am also having her maintain her inFLIXimab, aspirin EC, losartan, anastrozole, LORazepam, atorvastatin, and loratadine.  No orders of the defined types were placed in this encounter.   Medications Discontinued During This Encounter  Medication Reason  . doxycycline (VIBRA-TABS) 100 MG tablet Completed Course  . chlorpheniramine-HYDROcodone (TUSSIONEX PENNKINETIC ER) 10-8 MG/5ML SUER No longer needed (for PRN medications)  . linaclotide (LINZESS) 145 MCG CAPS capsule Patient has not taken in last 30 days   A total of 25 minutes of face to face time was spent with patient more than half of which was spent in counselling about the above mentioned conditions  and coordination of care   Follow-up: Return in about 4 months (around 09/26/2016), or on or after may 29 labs same day , for Can cancel feb 2 lAB  .   [name], MD

## 2016-05-29 NOTE — Progress Notes (Signed)
Pre-visit discussion using our clinic review tool. No additional management support is needed unless otherwise documented below in the visit note.  

## 2016-05-29 NOTE — Patient Instructions (Addendum)
I agree that your blood pressure medication needs adjusting   Increase your losartan  Dose to 75 mg in the evening.    Goal is 120/70  .    The Alternative is  To split the dose f losartan into 2 doses:  take 50 mg in the pm and 25 mg in am    No worries with change in odor or urine;  Does not mean anything.  All medications and foods can do this!

## 2016-05-31 ENCOUNTER — Encounter: Payer: Self-pay | Admitting: Internal Medicine

## 2016-05-31 NOTE — Assessment & Plan Note (Signed)
Receives remicaide, last infusion yeterday.  Advised to discuss getting shingles vaccine with dr Jefm Bryant.

## 2016-05-31 NOTE — Assessment & Plan Note (Signed)
she has diet controlled DM.  She is not overweight and has a careful diet.  Will repeat in 6 months.  She has a documented intolerance to lisinopril and no proteinuria by recent assessment.    Lab Results  Component Value Date   HGBA1C 6.5 04/01/2016   Lab Results  Component Value Date   MICROALBUR 1.3 10/02/2015

## 2016-05-31 NOTE — Assessment & Plan Note (Signed)
Well controlled on current every other day statin therapy.   Liver enzymes are normal , no changes today.  Lab Results  Component Value Date   CHOL 176 05/29/2016   HDL 78.20 05/29/2016   LDLCALC 84 05/29/2016   LDLDIRECT 151.5 10/03/2013   TRIG 67.0 05/29/2016   CHOLHDL 2 05/29/2016   Lab Results  Component Value Date   ALT 12 05/29/2016   AST 16 05/29/2016   ALKPHOS 69 05/29/2016   BILITOT 0.8 05/29/2016

## 2016-05-31 NOTE — Assessment & Plan Note (Signed)
Increased losartan to 75 mg today for persistent elevation by home readings

## 2016-06-01 DIAGNOSIS — M62830 Muscle spasm of back: Secondary | ICD-10-CM | POA: Diagnosis not present

## 2016-06-01 DIAGNOSIS — M791 Myalgia: Secondary | ICD-10-CM | POA: Diagnosis not present

## 2016-06-01 DIAGNOSIS — M542 Cervicalgia: Secondary | ICD-10-CM | POA: Diagnosis not present

## 2016-06-01 DIAGNOSIS — M50322 Other cervical disc degeneration at C5-C6 level: Secondary | ICD-10-CM | POA: Diagnosis not present

## 2016-06-01 DIAGNOSIS — M25512 Pain in left shoulder: Secondary | ICD-10-CM | POA: Diagnosis not present

## 2016-06-01 DIAGNOSIS — M50321 Other cervical disc degeneration at C4-C5 level: Secondary | ICD-10-CM | POA: Diagnosis not present

## 2016-06-01 DIAGNOSIS — M5412 Radiculopathy, cervical region: Secondary | ICD-10-CM | POA: Diagnosis not present

## 2016-06-01 DIAGNOSIS — M5413 Radiculopathy, cervicothoracic region: Secondary | ICD-10-CM | POA: Diagnosis not present

## 2016-06-01 DIAGNOSIS — M9901 Segmental and somatic dysfunction of cervical region: Secondary | ICD-10-CM | POA: Diagnosis not present

## 2016-06-05 ENCOUNTER — Other Ambulatory Visit: Payer: Medicare Other

## 2016-06-26 ENCOUNTER — Ambulatory Visit: Payer: Medicare Other | Admitting: Radiation Oncology

## 2016-07-02 ENCOUNTER — Ambulatory Visit (INDEPENDENT_AMBULATORY_CARE_PROVIDER_SITE_OTHER): Payer: Medicare Other | Admitting: Internal Medicine

## 2016-07-02 ENCOUNTER — Encounter: Payer: Self-pay | Admitting: Internal Medicine

## 2016-07-02 DIAGNOSIS — E119 Type 2 diabetes mellitus without complications: Secondary | ICD-10-CM

## 2016-07-02 DIAGNOSIS — F411 Generalized anxiety disorder: Secondary | ICD-10-CM | POA: Diagnosis not present

## 2016-07-02 DIAGNOSIS — I10 Essential (primary) hypertension: Secondary | ICD-10-CM

## 2016-07-02 LAB — POCT GLYCOSYLATED HEMOGLOBIN (HGB A1C): HEMOGLOBIN A1C: 5.8

## 2016-07-02 MED ORDER — LORATADINE 10 MG PO TABS: 10.0000 mg | ORAL_TABLET | Freq: Every day | ORAL | 5 refills | Status: DC

## 2016-07-02 MED ORDER — FUROSEMIDE 20 MG PO TABS: 20.0000 mg | ORAL_TABLET | Freq: Every day | ORAL | 3 refills | Status: DC

## 2016-07-02 MED ORDER — LOSARTAN POTASSIUM 100 MG PO TABS: 100.0000 mg | ORAL_TABLET | Freq: Every day | ORAL | 1 refills | Status: DC

## 2016-07-02 NOTE — Progress Notes (Signed)
Pre visit review using our clinic review tool, if applicable. No additional management support is needed unless otherwise documented below in the visit note. 

## 2016-07-02 NOTE — Patient Instructions (Addendum)
For your blood pressure:  You should increase the losartan to 2 tablets daily, either split or together,  Total daily dose 100 mg.    I am adding  furosemide as a diuretic to take once daily in the afternoons once you are home.   Please consider starting a daily medication for anxiety (Lexapro, Zoloft,  Paxil)  Return in one month

## 2016-07-02 NOTE — Progress Notes (Signed)
Subjective:  Patient ID: Kim Sanchez, female    DOB: [birth year]  Age: 76 y.o. MRN: [id number]  CC: Diagnoses of Diabetes mellitus with no complication ([place]), Essential hypertension, and Generalized anxiety disorder were pertinent to this visit.  HPI BRI WAKEMAN presents for follow up on hypertension ,  DM type 2,  Diet controlled and GAD. She was  last seen one month ago  Bilateral hand swelling has been occurring every night for months.  She requests a diuretic   Tearful,  Very anxious due to the stress of managing her householdl since her husband has lost his ability to manage the financial  responsibilities   Lab Results  Component Value Date   HGBA1C 5.8 07/02/2016     Outpatient Medications Prior to Visit  Medication Sig Dispense Refill  . anastrozole (ARIMIDEX) 1 MG tablet Take 1 tablet (1 mg total) by mouth daily. 90 tablet 1  . aspirin EC 81 MG tablet Take 81 mg by mouth daily.    Marland Kitchen atorvastatin (LIPITOR) 10 MG tablet Take 1 tablet (10 mg total) by mouth daily. (Patient taking differently: Take 10 mg by mouth every other day. ) 30 tablet 3  . inFLIXimab (REMICADE) 100 MG injection Inject 1 mg into the vein every 8 (eight) weeks.    Marland Kitchen LORazepam (ATIVAN) 0.5 MG tablet Take 1 tablet (0.5 mg total) by mouth every 8 (eight) hours as needed. for anxiety 90 tablet 5  . loratadine (CLARITIN) 10 MG tablet Take 1 tablet (10 mg total) by mouth daily. 30 tablet 0  . losartan (COZAAR) 50 MG tablet Take 1 tablet (50 mg total) by mouth daily. (Patient taking differently: Take 75 mg by mouth daily. ) 90 tablet 3   No facility-administered medications prior to visit.     Review of Systems;  Patient denies headache, fevers, malaise, unintentional weight loss, skin rash, eye pain, sinus congestion and sinus pain, sore throat, dysphagia,  hemoptysis , cough, dyspnea, wheezing, chest pain, palpitations, orthopnea, edema, abdominal pain, nausea, melena, diarrhea, constipation, flank pain,  dysuria, hematuria, urinary  Frequency, nocturia, numbness, tingling, seizures,  Focal weakness, Loss of consciousness,  Tremor, insomnia, depression, anxiety, and suicidal ideation.      Objective:  BP (!) 168/90   Pulse 80   Resp 16   Wt 138 lb (62.6 kg)   SpO2 97%   BMI 26.07 kg/m   BP Readings from Last 3 Encounters:  07/02/16 (!) 168/90  05/29/16 (!) 168/82  04/30/16 (!) 167/75    Wt Readings from Last 3 Encounters:  07/02/16 138 lb (62.6 kg)  05/29/16 135 lb 4 oz (61.3 kg)  04/30/16 137 lb (62.1 kg)    General appearance: alert, cooperative and appears stated age Ears: normal TM's and external ear canals both ears Throat: lips, mucosa, and tongue normal; teeth and gums normal Neck: no adenopathy, no carotid bruit, supple, symmetrical, trachea midline and thyroid not enlarged, symmetric, no tenderness/mass/nodules Back: symmetric, no curvature. ROM normal. No CVA tenderness. Lungs: clear to auscultation bilaterally Heart: regular rate and rhythm, S1, S2 normal, no murmur, click, rub or gallop Abdomen: soft, non-tender; bowel sounds normal; no masses,  no organomegaly Pulses: 2+ and symmetric Skin: Skin color, texture, turgor normal. No rashes or lesions Lymph nodes: Cervical, supraclavicular, and axillary nodes normal.  Lab Results  Component Value Date   HGBA1C 5.8 07/02/2016   HGBA1C 6.5 04/01/2016   HGBA1C 6.3 10/02/2015    Lab Results  Component Value Date  CREATININE 0.69 05/29/2016   CREATININE 0.70 04/01/2016   CREATININE 0.66 01/15/2016    Lab Results  Component Value Date   WBC 5.7 04/01/2016   HGB 14.3 04/01/2016   HCT 42.0 04/01/2016   PLT 240.0 04/01/2016   GLUCOSE 93 05/29/2016   CHOL 176 05/29/2016   TRIG 67.0 05/29/2016   HDL 78.20 05/29/2016   LDLDIRECT 151.5 10/03/2013   LDLCALC 84 05/29/2016   ALT 12 05/29/2016   AST 16 05/29/2016   NA 142 05/29/2016   K 3.8 05/29/2016   CL 105 05/29/2016   CREATININE 0.69 05/29/2016   BUN  11 05/29/2016   CO2 29 05/29/2016   TSH 2.15 04/01/2016   HGBA1C 5.8 07/02/2016   MICROALBUR 1.3 10/02/2015    Dg Bone Density  Result Date: 01/27/2016 EXAM: DUAL X-RAY ABSORPTIOMETRY (DXA) FOR BONE MINERAL DENSITY IMPRESSION: Dear Dr. Delight Hoh, Your patient Kim Sanchez completed a BMD test on 01/27/2016 using the [place] (analysis version: 14.10) manufactured by [employer]. The following summarizes the results of our evaluation. PATIENT BIOGRAPHICAL: Name: Kim Sanchez Patient ID: [id number] Birth Date: 05-Nov-1940 Height: 62.0 in. Gender: Female Exam Date: 01/27/2016 Weight: 137.4 lbs. Indications: Advanced Age, Breast CA, Caucasian, Family Hx of Osteoporosis, High Risk Meds, History of Breast Cancer, Parent Hip Fracture, Postmenopausal Fractures: Treatments: anastrozole, calcium, vitamin D ASSESSMENT: The BMD measured at AP Spine L1-L4 is 1.009 g/cm2 with a T-score of -1.5. This patient is considered osteopenic according to [facility]) criteria. Patient is not a candidate for FRAX due to Anastrozole. Site Region Measured Measured WHO Young Adult BMD Date       Age      Classification T-score AP Spine L1-L4 01/27/2016 74.7 Osteopenia -1.5 1.009 g/cm2 AP Spine L1-L4 01/23/2014 72.7 Normal -1.0 1.070 g/cm2 AP Spine L1-L4 01/17/2013 71.7 Normal -0.6 1.120 g/cm2 DualFemur Neck Right 01/27/2016 74.7 Osteopenia -1.2 0.876 g/cm2 DualFemur Neck Right 01/23/2014 72.7 Normal -0.8 0.922 g/cm2 DualFemur Neck Right 01/17/2013 71.7 Normal -1.0 0.905 g/cm2 World Health Organization [facility]) criteria for post-menopausal, Caucasian Women: Normal:       T-score at or above -1 SD Osteopenia:   T-score between -1 and -2.5 SD Osteoporosis: T-score at or below -2.5 SD RECOMMENDATIONS: [place] recommends that FDA-approved medical therapies be considered in postmenopausal women and men age 54 or older with a: 1. Hip or vertebral (clinical or morphometric) fracture.  2. T-score of < -2.5 at the spine or hip. 3. Ten-year fracture probability by FRAX of 3% or greater for hip fracture or 20% or greater for major osteoporotic fracture. All treatment decisions require clinical judgment and consideration of individual patient factors, including patient preferences, co-morbidities, previous drug use, risk factors not captured in the FRAX model (e.g. falls, vitamin D deficiency, increased bone turnover, interval significant decline in bone density) and possible under - or over-estimation of fracture risk by FRAX. All patients should ensure an adequate intake of dietary calcium (1200 mg/d) and vitamin D (800 IU daily) unless contraindicated. FOLLOW-UP: People with diagnosed cases of osteoporosis or at high risk for fracture should have regular bone mineral density tests. For patients eligible for Medicare, routine testing is allowed once every 2 years. The testing frequency can be increased to one year for patients who have rapidly progressing disease, those who are receiving or discontinuing medical therapy to restore bone mass, or have additional risk factors. Sanchez have reviewed this report, and agree with the above findings. [facility] Electronically  Signed   By: Lahoma Crocker M.D.   On: 01/27/2016 11:30    Assessment & Plan:   Problem List Items Addressed This Visit    Diabetes mellitus with no complication (HCC)   Relevant Medications   losartan (COZAAR) 100 MG tablet   Other Relevant Orders   POCT glycosylated hemoglobin (Hb A1C) (Completed)   Generalized anxiety disorder    Worsening symptoms due to husband's inability to manage their finances any more .  Urged to consider ssri therapy.       Hypertension    Not well controlled on current regimen.  Advised to increase the losartan dose to 100 mg daily       Relevant Medications   furosemide (LASIX) 20 MG tablet   losartan (COZAAR) 100 MG tablet     A total of 25 minutes of face to face time was spent  with patient more than half of which was spent in counselling about the above mentioned conditions  and coordination of care  Sanchez have changed Ms. Mandella's losartan. Sanchez am also having her start on furosemide. Additionally, Sanchez am having her maintain her inFLIXimab, aspirin EC, anastrozole, LORazepam, atorvastatin, and loratadine.  Meds ordered this encounter  Medications  . loratadine (CLARITIN) 10 MG tablet    Sig: Take 1 tablet (10 mg total) by mouth daily.    Dispense:  30 tablet    Refill:  5  . furosemide (LASIX) 20 MG tablet    Sig: Take 1 tablet (20 mg total) by mouth daily.    Dispense:  30 tablet    Refill:  3  . losartan (COZAAR) 100 MG tablet    Sig: Take 1 tablet (100 mg total) by mouth daily.    Dispense:  90 tablet    Refill:  1    Medications Discontinued During This Encounter  Medication Reason  . loratadine (CLARITIN) 10 MG tablet Reorder  . losartan (COZAAR) 50 MG tablet Reorder    Follow-up: Return in about 1 month (around 08/02/2016).   [name], MD

## 2016-07-03 ENCOUNTER — Telehealth: Payer: Self-pay | Admitting: General Surgery

## 2016-07-03 NOTE — Telephone Encounter (Signed)
L/M FOR PT TO CALL AND LET Kim Sanchez KNOW WHERE SHE WOULD LIKE HAVE  HER NEXT DX MAMMO AT [place].

## 2016-07-04 NOTE — Assessment & Plan Note (Signed)
Worsening symptoms due to husband's inability to manage their finances any more .  Urged to consider ssri therapy.

## 2016-07-04 NOTE — Assessment & Plan Note (Signed)
Not well controlled on current regimen.  Advised to increase the losartan dose to 100 mg daily

## 2016-07-07 ENCOUNTER — Other Ambulatory Visit: Payer: Self-pay

## 2016-07-07 DIAGNOSIS — Z170 Estrogen receptor positive status [ER+]: Principal | ICD-10-CM

## 2016-07-07 DIAGNOSIS — C50911 Malignant neoplasm of unspecified site of right female breast: Secondary | ICD-10-CM

## 2016-07-08 DIAGNOSIS — R0789 Other chest pain: Secondary | ICD-10-CM | POA: Diagnosis not present

## 2016-07-08 DIAGNOSIS — I10 Essential (primary) hypertension: Secondary | ICD-10-CM | POA: Diagnosis not present

## 2016-07-15 ENCOUNTER — Encounter: Payer: Self-pay | Admitting: Internal Medicine

## 2016-07-15 MED ORDER — VALSARTAN 80 MG PO TABS: 80.0000 mg | ORAL_TABLET | Freq: Every day | ORAL | 1 refills | Status: DC

## 2016-07-16 MED ORDER — VALSARTAN 80 MG PO TABS: 80.0000 mg | ORAL_TABLET | Freq: Every day | ORAL | 1 refills | Status: DC

## 2016-07-16 NOTE — Addendum Note (Signed)
Addended by: Adair Laundry on: 07/16/2016 04:49 PM   Modules accepted: Orders

## 2016-07-16 NOTE — Telephone Encounter (Signed)
Medication resent to [facility] on [address].

## 2016-07-16 NOTE — Telephone Encounter (Signed)
Pt called and stated that she now goes to [place], [place] for prescriptions. Can we please resent to this pharmacy. Please advise, thank you!

## 2016-07-24 DIAGNOSIS — K5080 Crohn's disease of both small and large intestine without complications: Secondary | ICD-10-CM | POA: Diagnosis not present

## 2016-07-27 ENCOUNTER — Encounter: Payer: Self-pay | Admitting: Internal Medicine

## 2016-07-29 ENCOUNTER — Encounter: Payer: Self-pay | Admitting: Internal Medicine

## 2016-07-29 DIAGNOSIS — R82998 Other abnormal findings in urine: Secondary | ICD-10-CM

## 2016-07-30 ENCOUNTER — Other Ambulatory Visit: Payer: Medicare Other

## 2016-07-30 ENCOUNTER — Telehealth: Payer: Self-pay

## 2016-07-30 ENCOUNTER — Telehealth: Payer: Self-pay | Admitting: *Deleted

## 2016-07-30 DIAGNOSIS — R82998 Other abnormal findings in urine: Secondary | ICD-10-CM

## 2016-07-30 DIAGNOSIS — R8299 Other abnormal findings in urine: Secondary | ICD-10-CM | POA: Diagnosis not present

## 2016-07-30 NOTE — Telephone Encounter (Signed)
mychart message sent

## 2016-07-30 NOTE — Addendum Note (Signed)
Addended by: Arby Barrette on: 07/30/2016 04:18 PM   Modules accepted: Orders

## 2016-07-30 NOTE — Telephone Encounter (Signed)
Spoke with pt and she stated that she would be here by 4:30pm to give a urine specimen per Dr. Derrel Nip.

## 2016-07-30 NOTE — Telephone Encounter (Signed)
Pt thought that we would be able to run the test in the lab today. Urine was sent to [facility]. Pt states that she will be going out of town & will have her cell phone with her & will be checking her mychart messages.

## 2016-07-30 NOTE — Telephone Encounter (Signed)
LMTCB

## 2016-07-30 NOTE — Telephone Encounter (Signed)
Spoke with pt and informed her that the message has been sent to Dr. Derrel Nip and as soon as she gets back with [place] about the message we will give her a call back.

## 2016-07-30 NOTE — Telephone Encounter (Signed)
Pt called back looking for an update.   Call pt @ [phone]

## 2016-07-30 NOTE — Telephone Encounter (Signed)
Patient requested a call to discuss a mychart message that she sent to Dr. Derrel Nip on 07/29/16. Pt Contact [phone]

## 2016-07-30 NOTE — Telephone Encounter (Signed)
Please call pt at [phone]

## 2016-07-30 NOTE — Telephone Encounter (Signed)
Spoke with patient states she is not having any dysuria, no low back pain, urine is dark in color, no urinary frequency.  Patient states she may not be drinking enough fluids.   Encouraged to make sure she has adequate fluid intake.

## 2016-07-31 LAB — URINALYSIS, ROUTINE W REFLEX MICROSCOPIC
Bilirubin Urine: NEGATIVE
Glucose, UA: NEGATIVE
HGB URINE DIPSTICK: NEGATIVE
KETONES UR: NEGATIVE
LEUKOCYTES UA: NEGATIVE
NITRITE: NEGATIVE
PROTEIN: NEGATIVE
Specific Gravity, Urine: 1.016 (ref 1.001–1.035)
pH: 7.5 (ref 5.0–8.0)

## 2016-07-31 LAB — MICROALBUMIN / CREATININE URINE RATIO
CREATININE, URINE: 99 mg/dL (ref 20–320)
MICROALB UR: 0.3 mg/dL
MICROALB/CREAT RATIO: 3 ug/mg{creat} (ref <30)

## 2016-08-02 ENCOUNTER — Encounter: Payer: Self-pay | Admitting: Internal Medicine

## 2016-08-03 ENCOUNTER — Ambulatory Visit: Payer: Medicare Other | Admitting: Oncology

## 2016-08-03 ENCOUNTER — Ambulatory Visit: Payer: Medicare Other | Admitting: Radiation Oncology

## 2016-08-09 NOTE — Progress Notes (Signed)
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  INTERVAL HISTORY: Patient returns to clinic today for routine six-month evaluation. She continues to tolerate anastrozole without significant side effects. She has no neurologic complaints.  She denies any fevers or recent illnesses.  She has a good appetite and denies weight loss.  She denies any chest pain or shortness of breath.  She has no nausea, vomiting, constipation, or diarrhea.  She has no urinary complaints.  Patient offers no specific complaints today.  REVIEW OF SYSTEMS:   Review of Systems  Constitutional: Negative.  Negative for fever, malaise/fatigue and weight loss.  Respiratory: Negative.  Negative for cough and shortness of breath.   Cardiovascular: Negative.  Negative for chest pain and leg swelling.  Gastrointestinal: Negative.   Genitourinary: Negative.   Musculoskeletal: Negative.   Neurological: Negative.  Negative for sensory change and weakness.  Psychiatric/Behavioral: Negative.  The patient is not nervous/anxious and does not have insomnia.    As per HPI. Otherwise, a complete review of systems is negative.  PAST MEDICAL HISTORY: Past Medical History:  Diagnosis Date  . Arthritis   . Breast cancer, right breast ([place]) 08/24/2012   Histologic grade 1, invasive mammary carcinoma, no specific type. 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  . Crohn's disease ([place])   . GERD (gastroesophageal reflux disease)   . History of blood transfusion 1960  . Hypertension     PAST SURGICAL HISTORY: Past  Surgical History:  Procedure Laterality Date  . BRAIN SURGERY  1980   breast biopsies, benign  . BREAST LUMPECTOMY Right May 2014   T1c, N0; ER/PR positive, HER-2/neu not overexpressing.  Marland Kitchen BREAST SURGERY  1980's   fibro cyst both breast in [place]  . COLONOSCOPY  2017  . DILATION AND CURETTAGE OF UTERUS      FAMILY HISTORY Family History  Problem Relation Age of Onset  . Cancer Paternal Grandmother   . Heart disease Sister   . Diabetes Sister        ADVANCED DIRECTIVES:    HEALTH MAINTENANCE: Social History  Substance Use Topics  . Smoking status: Never Smoker  . Smokeless tobacco: Never Used  . Alcohol use 0.6 oz/week    1 Glasses of wine per week     Comment: OCC     Colonoscopy:  PAP:  Bone density:  Lipid panel:  Allergies  Allergen Reactions  . Letrozole Anaphylaxis and Itching  . Amlodipine Itching  . Exemestane Nausea Only  . Hydrochlorothiazide Other (See Comments)  . Lexapro [Escitalopram Oxalate] Other (See Comments)    intolerance  . Lisinopril Cough  . Sulfa Antibiotics Other (See Comments)    Current Outpatient Prescriptions  Medication Sig Dispense Refill  . anastrozole (ARIMIDEX) 1 MG tablet Take 1 tablet (1 mg total) by mouth daily. 90 tablet 1  . aspirin EC 81 MG tablet Take 81 mg by mouth daily.    Marland Kitchen atorvastatin (LIPITOR) 10 MG tablet Take 1 tablet (10 mg total) by mouth daily. (Patient taking differently: Take 10 mg by mouth every other day. ) 30 tablet 3  . furosemide (LASIX) 20 MG tablet Take 1  tablet (20 mg total) by mouth daily. 30 tablet 3  . inFLIXimab (REMICADE) 100 MG injection Inject 1 mg into the vein every 8 (eight) weeks.    Marland Kitchen loratadine (CLARITIN) 10 MG tablet Take 1 tablet (10 mg total) by mouth daily. 30 tablet 5  . LORazepam (ATIVAN) 0.5 MG tablet Take 1 tablet (0.5 mg total) by mouth every 8 (eight) hours as needed. for anxiety 90 tablet 5  . Omega-3 Fatty Acids (FISH OIL PO) Take by mouth.    . valsartan (DIOVAN) 80  MG tablet Take 1 tablet (80 mg total) by mouth daily. 90 tablet 1  . vitamin E 400 UNIT capsule Take by mouth.     No current facility-administered medications for this visit.     OBJECTIVE: Vitals:   08/10/16 1110  BP: (!) 144/84  Pulse: 74  Temp: 97.5 F (36.4 C)     Body mass index is 25.89 kg/m.    ECOG FS:0 - Asymptomatic  General: Well-developed, well-nourished, no acute distress. Eyes: Pink conjunctiva, anicteric sclera. Breasts: Bilateral breast and axilla without lumps or masses.Patient requested exam be deferred today. Lungs: Clear to auscultation bilaterally. Heart: Regular rate and rhythm. No rubs, murmurs, or gallops. Abdomen: Soft, nontender, nondistended. No organomegaly noted, normoactive bowel sounds. Musculoskeletal: No edema, cyanosis, or clubbing. Neuro: Alert, answering all questions appropriately. Cranial nerves grossly intact. Skin: No rashes or petechiae noted. Psych: Normal affect.   LAB RESULTS:  Lab Results  Component Value Date   NA 142 05/29/2016   K 3.8 05/29/2016   CL 105 05/29/2016   CO2 29 05/29/2016   GLUCOSE 93 05/29/2016   BUN 11 05/29/2016   CREATININE 0.69 05/29/2016   CALCIUM 9.5 05/29/2016   PROT 7.7 05/29/2016   ALBUMIN 4.3 05/29/2016   AST 16 05/29/2016   ALT 12 05/29/2016   ALKPHOS 69 05/29/2016   BILITOT 0.8 05/29/2016   GFRNONAA >60 08/30/2012   GFRAA >60 08/30/2012    Lab Results  Component Value Date   WBC 5.7 04/01/2016   NEUTROABS 3.4 04/01/2016   HGB 14.3 04/01/2016   HCT 42.0 04/01/2016   MCV 91.2 04/01/2016   PLT 240.0 04/01/2016     STUDIES: No results found.  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 No Oncotype score available.  PLAN:    1.  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  Continue with anastrozole, completing in  September of 2019.  Patient's most recent mammogram on Oct 01, 2015 was reported as BI-RADS 3. Recent biopsy of her right breast on April 11, 2015 was negative for malignancy. She has a repeat mammogram scheduled for Oct 01, 2016. Return to clinic in 6 months for routine evaluation. 2. Osteopenia: Bone mineral density on January 27, 2016 with a T score of -1.5 which is slightly worse than 2 years ago. Repeat bone mineral density in September 2018. Continue calcium and vitamin D supplementation.  2. Arthritis: Continue Remicade as prescribed by her rheumatologist. 3. Elevated blood pressure: Monitor, continue treatment per PCP.  Patient expressed understanding and was in agreement with this plan. She also understands that She can call clinic at any time with any questions, concerns, or complaints.    Lloyd Huger, MD   08/16/2016 11:21 AM

## 2016-08-10 ENCOUNTER — Inpatient Hospital Stay: Payer: Medicare Other | Attending: Oncology | Admitting: Oncology

## 2016-08-10 ENCOUNTER — Ambulatory Visit
Admission: RE | Admit: 2016-08-10 | Discharge: 2016-08-10 | Disposition: A | Discharge status: Home / Self Care | Payer: Medicare Other | Source: Ambulatory Visit | Attending: Radiation Oncology | Admitting: Radiation Oncology

## 2016-08-10 ENCOUNTER — Encounter: Payer: Self-pay | Admitting: Radiation Oncology

## 2016-08-10 VITALS — BP 159/85 | HR 71 | Temp 98.0°F | Wt 137.7 lb

## 2016-08-10 VITALS — BP 144/84 | HR 74 | Temp 97.5°F | Ht 61.0 in | Wt 137.0 lb

## 2016-08-10 DIAGNOSIS — Z79899 Other long term (current) drug therapy: Secondary | ICD-10-CM | POA: Insufficient documentation

## 2016-08-10 DIAGNOSIS — K219 Gastro-esophageal reflux disease without esophagitis: Secondary | ICD-10-CM | POA: Diagnosis not present

## 2016-08-10 DIAGNOSIS — Z170 Estrogen receptor positive status [ER+]: Secondary | ICD-10-CM | POA: Insufficient documentation

## 2016-08-10 DIAGNOSIS — Z923 Personal history of irradiation: Secondary | ICD-10-CM | POA: Insufficient documentation

## 2016-08-10 DIAGNOSIS — C50911 Malignant neoplasm of unspecified site of right female breast: Secondary | ICD-10-CM

## 2016-08-10 DIAGNOSIS — Z7982 Long term (current) use of aspirin: Secondary | ICD-10-CM | POA: Diagnosis not present

## 2016-08-10 DIAGNOSIS — I10 Essential (primary) hypertension: Secondary | ICD-10-CM | POA: Diagnosis not present

## 2016-08-10 DIAGNOSIS — Z800 Family history of malignant neoplasm of digestive organs: Secondary | ICD-10-CM | POA: Diagnosis not present

## 2016-08-10 DIAGNOSIS — M129 Arthropathy, unspecified: Secondary | ICD-10-CM | POA: Insufficient documentation

## 2016-08-10 DIAGNOSIS — Z853 Personal history of malignant neoplasm of breast: Secondary | ICD-10-CM | POA: Insufficient documentation

## 2016-08-10 DIAGNOSIS — M8580 Other specified disorders of bone density and structure, unspecified site: Secondary | ICD-10-CM | POA: Diagnosis not present

## 2016-08-10 DIAGNOSIS — K5090 Crohn's disease, unspecified, without complications: Secondary | ICD-10-CM | POA: Insufficient documentation

## 2016-08-10 DIAGNOSIS — Z79811 Long term (current) use of aromatase inhibitors: Secondary | ICD-10-CM | POA: Diagnosis not present

## 2016-08-10 NOTE — Progress Notes (Signed)
Radiation Oncology Follow up Note  Name: Kim Sanchez   Date:   08/10/2016 MRN:  [id number] DOB: 08-05-1940    This 76 y.o. female presents to the clinic today for a 3 year follow-up status post whole breast radiation to her right breast for stage I ER/PR positive invasive mammary carcinoma.  REFERRING PROVIDER: [name], MD  HPI: Patient is a 76 year old female now out 3/2 years having completed whole breast radiation to her right breast for ER/PR positive stage I invasive mammary carcinoma. She seen today in routine follow-up and is doing well. Currently on letrozole tolerating that well without side effect.. Her mammograms have been fine. She scheduled for another mammogram in May. She specifically denies breast tenderness cough or bone pain.  COMPLICATIONS OF TREATMENT: none  FOLLOW UP COMPLIANCE: keeps appointments   PHYSICAL EXAM:  BP (!) 159/85   Pulse 71   Temp 98 F (36.7 C)   Wt 137 lb 10.8 oz (62.5 kg)   BMI 26.01 kg/m  Lungs are clear to A&P cardiac examination essentially unremarkable with regular rate and rhythm. No dominant mass or nodularity is noted in either breast in 2 positions examined. Incision is well-healed. No axillary or supraclavicular adenopathy is appreciated. Cosmetic result is excellent. Well-developed well-nourished patient in NAD. HEENT reveals PERLA, EOMI, discs not visualized.  Oral cavity is clear. No oral mucosal lesions are identified. Neck is clear without evidence of cervical or supraclavicular adenopathy. Lungs are clear to A&P. Cardiac examination is essentially unremarkable with regular rate and rhythm without murmur rub or thrill. Abdomen is benign with no organomegaly or masses noted. Motor sensory and DTR levels are equal and symmetric in the upper and lower extremities. Cranial nerves II through XII are grossly intact. Proprioception is intact. No peripheral adenopathy or edema is identified. No motor or sensory levels are noted. Crude  visual fields are within normal range.  RADIOLOGY RESULTS: Mammogram reports are reviewed  PLAN: Present time she continues to do well with no evidence of disease. I'll see her back in 1 year for follow-up and then discontinue follow-up care. She continues on letrozole without side effect. Patient knows to call with any concerns.  I would like to take this opportunity to thank you for allowing me to participate in the care of your patient.Armstead Peaks., MD

## 2016-08-10 NOTE — Progress Notes (Signed)
Patient here for follow up no changes since last appointment.

## 2016-08-13 ENCOUNTER — Ambulatory Visit: Payer: Medicare Other | Admitting: Internal Medicine

## 2016-08-13 DIAGNOSIS — R0789 Other chest pain: Secondary | ICD-10-CM | POA: Diagnosis not present

## 2016-08-17 DIAGNOSIS — I272 Pulmonary hypertension, unspecified: Secondary | ICD-10-CM | POA: Diagnosis not present

## 2016-08-17 DIAGNOSIS — I10 Essential (primary) hypertension: Secondary | ICD-10-CM | POA: Diagnosis not present

## 2016-08-17 DIAGNOSIS — R0789 Other chest pain: Secondary | ICD-10-CM | POA: Diagnosis not present

## 2016-08-18 ENCOUNTER — Encounter: Payer: Self-pay | Admitting: Internal Medicine

## 2016-08-18 ENCOUNTER — Ambulatory Visit (INDEPENDENT_AMBULATORY_CARE_PROVIDER_SITE_OTHER): Payer: Medicare Other | Admitting: Internal Medicine

## 2016-08-18 DIAGNOSIS — F409 Phobic anxiety disorder, unspecified: Secondary | ICD-10-CM | POA: Diagnosis not present

## 2016-08-18 DIAGNOSIS — F5105 Insomnia due to other mental disorder: Secondary | ICD-10-CM | POA: Diagnosis not present

## 2016-08-18 DIAGNOSIS — F411 Generalized anxiety disorder: Secondary | ICD-10-CM | POA: Diagnosis not present

## 2016-08-18 DIAGNOSIS — I10 Essential (primary) hypertension: Secondary | ICD-10-CM

## 2016-08-18 MED ORDER — ESCITALOPRAM OXALATE 5 MG PO TABS: 5.0000 mg | ORAL_TABLET | Freq: Every day | ORAL | 5 refills | Status: DC

## 2016-08-18 NOTE — Progress Notes (Signed)
Subjective:  Patient ID: Kim Sanchez, female    DOB: 1940/06/06  Age: 76 y.o. MRN: [id number]  CC: Diagnoses of Generalized anxiety disorder, Insomnia due to anxiety and fear, and Essential hypertension were pertinent to this visit.  HPI Kim Sanchez presents for follow up on type 2 dm , hypertension and worsening  Anxiety.  She feels less anxious since her husband's condition and mood have  improved  Since her last visit she had a cardiac workup for  Atypical chest pain and dyspnea. Had a normal nuc med study , ECHO with normal LV function, mild to mod TR with pulmonary hypertension .  sleep study was advised but deferred by patient.   Having some sinus congestion sneezing and rhinitis.     Outpatient Medications Prior to Visit  Medication Sig Dispense Refill  . anastrozole (ARIMIDEX) 1 MG tablet Take 1 tablet (1 mg total) by mouth daily. 90 tablet 1  . aspirin EC 81 MG tablet Take 81 mg by mouth daily.    Marland Kitchen atorvastatin (LIPITOR) 10 MG tablet Take 1 tablet (10 mg total) by mouth daily. (Patient taking differently: Take 10 mg by mouth every other day. ) 30 tablet 3  . furosemide (LASIX) 20 MG tablet Take 1 tablet (20 mg total) by mouth daily. 30 tablet 3  . inFLIXimab (REMICADE) 100 MG injection Inject 1 mg into the vein every 8 (eight) weeks.    Marland Kitchen loratadine (CLARITIN) 10 MG tablet Take 1 tablet (10 mg total) by mouth daily. 30 tablet 5  . LORazepam (ATIVAN) 0.5 MG tablet Take 1 tablet (0.5 mg total) by mouth every 8 (eight) hours as needed. for anxiety 90 tablet 5  . Omega-3 Fatty Acids (FISH OIL PO) Take by mouth.    . valsartan (DIOVAN) 80 MG tablet Take 1 tablet (80 mg total) by mouth daily. 90 tablet 1  . vitamin E 400 UNIT capsule Take by mouth.     No facility-administered medications prior to visit.     Review of Systems;  Patient denies headache, fevers, malaise, unintentional weight loss, skin rash, eye pain, sinus congestion and sinus pain, sore throat, dysphagia,   hemoptysis , cough, dyspnea, wheezing, chest pain, palpitations, orthopnea, edema, abdominal pain, nausea, melena, diarrhea, constipation, flank pain, dysuria, hematuria, urinary  Frequency, nocturia, numbness, tingling, seizures,  Focal weakness, Loss of consciousness,  Tremor, insomnia, depression, anxiety, and suicidal ideation.      Objective:  BP 130/62   Pulse 76   Temp 98.1 F (36.7 C) (Oral)   Resp 15   Ht 5' 1"  (1.549 m)   Wt 140 lb 6.4 oz (63.7 kg)   SpO2 96%   BMI 26.53 kg/m   BP Readings from Last 3 Encounters:  08/18/16 130/62  08/10/16 (!) 159/85  08/10/16 (!) 144/84    Wt Readings from Last 3 Encounters:  08/18/16 140 lb 6.4 oz (63.7 kg)  08/10/16 137 lb 10.8 oz (62.5 kg)  08/10/16 137 lb (62.1 kg)    General appearance: alert, cooperative and appears stated age Ears: normal TM's and external ear canals both ears Throat: lips, mucosa, and tongue normal; teeth and gums normal Neck: no adenopathy, no carotid bruit, supple, symmetrical, trachea midline and thyroid not enlarged, symmetric, no tenderness/mass/nodules Back: symmetric, no curvature. ROM normal. No CVA tenderness. Lungs: clear to auscultation bilaterally Heart: regular rate and rhythm, S1, S2 normal, no murmur, click, rub or gallop Abdomen: soft, non-tender; bowel sounds normal; no masses,  no organomegaly  Pulses: 2+ and symmetric Skin: Skin color, texture, turgor normal. No rashes or lesions Lymph nodes: Cervical, supraclavicular, and axillary nodes normal.  Lab Results  Component Value Date   HGBA1C 5.8 07/02/2016   HGBA1C 6.5 04/01/2016   HGBA1C 6.3 10/02/2015    Lab Results  Component Value Date   CREATININE 0.69 05/29/2016   CREATININE 0.70 04/01/2016   CREATININE 0.66 01/15/2016    Lab Results  Component Value Date   WBC 5.7 04/01/2016   HGB 14.3 04/01/2016   HCT 42.0 04/01/2016   PLT 240.0 04/01/2016   GLUCOSE 93 05/29/2016   CHOL 176 05/29/2016   TRIG 67.0 05/29/2016   HDL  78.20 05/29/2016   LDLDIRECT 151.5 10/03/2013   LDLCALC 84 05/29/2016   ALT 12 05/29/2016   AST 16 05/29/2016   NA 142 05/29/2016   K 3.8 05/29/2016   CL 105 05/29/2016   CREATININE 0.69 05/29/2016   BUN 11 05/29/2016   CO2 29 05/29/2016   TSH 2.15 04/01/2016   HGBA1C 5.8 07/02/2016   MICROALBUR 0.3 07/30/2016    No results found.  Assessment & Plan:   Problem List Items Addressed This Visit    Generalized anxiety disorder    She is feeling less anxious and wants to stop the lorazepam.  Taper  Advised. Starting lexapro       Hypertension    Well controlled on current regimen. Renal function stable, no changes today.  Lab Results  Component Value Date   CREATININE 0.69 05/29/2016   Lab Results  Component Value Date   NA 142 05/29/2016   K 3.8 05/29/2016   CL 105 05/29/2016   CO2 29 05/29/2016         Insomnia due to anxiety and fear    Weaning off lorazepam         A total of 25 minutes of face to face time was spent with patient more than half of which was spent in counselling about the above mentioned conditions  and coordination of care  I am having Kim Sanchez start on escitalopram. I am also having her maintain her inFLIXimab, aspirin EC, anastrozole, LORazepam, atorvastatin, loratadine, furosemide, valsartan, Omega-3 Fatty Acids (FISH OIL PO), vitamin E, and polyethylene glycol powder.  Meds ordered this encounter  Medications  . polyethylene glycol powder (GLYCOLAX/MIRALAX) powder    Sig: Take by mouth.  . escitalopram (LEXAPRO) 5 MG tablet    Sig: Take 1 tablet (5 mg total) by mouth daily.    Dispense:  30 tablet    Refill:  5    There are no discontinued medications.  Follow-up: Return in about 6 weeks (around 10/02/2016) for follow up diabetes.   [name], MD

## 2016-08-18 NOTE — Patient Instructions (Addendum)
Reduce the lorazepam to 1/2 tablet twice daily for one week,,  Then 1/2 tablet  once daily  for one week,, then stop completely   Ok to try new medication (Lexapro) at any time during this   Please start the Lexapro 5 mg daily in the evening after dinner       To help manage  Your rhinitis from pollen,  I recommend trying try [employer]'s Sinus rinse ;  It is a stong sinus "flush" using water and medicated salts.  Do it over the sink because it can be a bit messy

## 2016-08-18 NOTE — Progress Notes (Signed)
Pre visit review using our clinic review tool, if applicable. No additional management support is needed unless otherwise documented below in the visit note. 

## 2016-08-20 NOTE — Assessment & Plan Note (Signed)
Well controlled on current regimen. Renal function stable, no changes today.  Lab Results  Component Value Date   CREATININE 0.69 05/29/2016   Lab Results  Component Value Date   NA 142 05/29/2016   K 3.8 05/29/2016   CL 105 05/29/2016   CO2 29 05/29/2016

## 2016-08-20 NOTE — Assessment & Plan Note (Signed)
Weaning off lorazepam

## 2016-08-20 NOTE — Assessment & Plan Note (Addendum)
She is feeling less anxious and wants to stop the lorazepam.  Taper  Advised. Starting lexapro

## 2016-08-28 ENCOUNTER — Encounter: Payer: Self-pay | Admitting: Internal Medicine

## 2016-09-01 ENCOUNTER — Telehealth: Payer: Self-pay | Admitting: *Deleted

## 2016-09-01 LAB — HM DIABETES EYE EXAM

## 2016-09-02 ENCOUNTER — Encounter: Payer: Self-pay | Admitting: Internal Medicine

## 2016-09-03 ENCOUNTER — Other Ambulatory Visit: Payer: Self-pay | Admitting: Internal Medicine

## 2016-09-07 DIAGNOSIS — H2512 Age-related nuclear cataract, left eye: Secondary | ICD-10-CM | POA: Diagnosis not present

## 2016-09-16 ENCOUNTER — Encounter: Payer: Self-pay | Admitting: Internal Medicine

## 2016-09-16 DIAGNOSIS — R109 Unspecified abdominal pain: Secondary | ICD-10-CM

## 2016-09-18 DIAGNOSIS — K5080 Crohn's disease of both small and large intestine without complications: Secondary | ICD-10-CM | POA: Diagnosis not present

## 2016-09-23 ENCOUNTER — Telehealth: Payer: Self-pay | Admitting: Internal Medicine

## 2016-09-23 DIAGNOSIS — R7301 Impaired fasting glucose: Secondary | ICD-10-CM

## 2016-09-23 DIAGNOSIS — R5383 Other fatigue: Secondary | ICD-10-CM

## 2016-09-23 DIAGNOSIS — E7800 Pure hypercholesterolemia, unspecified: Secondary | ICD-10-CM

## 2016-09-23 NOTE — Telephone Encounter (Signed)
Pt called about needing lab work before her appt on 10/02/2016. Please advise? If pt needs lab work. Also does pt need to have urine done as well?   Call pt @ [phone]. Thank you!

## 2016-09-25 NOTE — Telephone Encounter (Signed)
Ok. Thank you! Pt was already scheduled.

## 2016-09-25 NOTE — Telephone Encounter (Signed)
Ordered labs and can be scheduled for fasting labs and yes a urine needs to be collected. thanks

## 2016-09-30 ENCOUNTER — Other Ambulatory Visit (INDEPENDENT_AMBULATORY_CARE_PROVIDER_SITE_OTHER): Payer: Medicare Other

## 2016-09-30 ENCOUNTER — Ambulatory Visit: Payer: Medicare Other | Admitting: Internal Medicine

## 2016-09-30 DIAGNOSIS — E7800 Pure hypercholesterolemia, unspecified: Secondary | ICD-10-CM

## 2016-09-30 DIAGNOSIS — R109 Unspecified abdominal pain: Secondary | ICD-10-CM | POA: Diagnosis not present

## 2016-09-30 DIAGNOSIS — R7301 Impaired fasting glucose: Secondary | ICD-10-CM

## 2016-09-30 DIAGNOSIS — R5383 Other fatigue: Secondary | ICD-10-CM

## 2016-09-30 LAB — COMPREHENSIVE METABOLIC PANEL
ALBUMIN: 4.2 g/dL (ref 3.5–5.2)
ALK PHOS: 83 U/L (ref 39–117)
ALT: 15 U/L (ref 0–35)
AST: 18 U/L (ref 0–37)
BUN: 15 mg/dL (ref 6–23)
CALCIUM: 9.5 mg/dL (ref 8.4–10.5)
CHLORIDE: 104 meq/L (ref 96–112)
CO2: 31 mEq/L (ref 19–32)
CREATININE: 0.69 mg/dL (ref 0.40–1.20)
GFR: 88.05 mL/min (ref >60.00)
Glucose, Bld: 117 mg/dL — ABNORMAL HIGH (ref 70–99)
POTASSIUM: 3.9 meq/L (ref 3.5–5.1)
SODIUM: 141 meq/L (ref 135–145)
TOTAL PROTEIN: 7.4 g/dL (ref 6.0–8.3)
Total Bilirubin: 0.7 mg/dL (ref 0.2–1.2)

## 2016-09-30 LAB — LIPID PANEL
CHOL/HDL RATIO: 3
Cholesterol: 251 mg/dL — ABNORMAL HIGH (ref 0–200)
HDL: 78.9 mg/dL (ref >39.00)
LDL CALC: 156 mg/dL — AB (ref 0–99)
NonHDL: 172.51
Triglycerides: 81 mg/dL (ref 0.0–149.0)
VLDL: 16.2 mg/dL (ref 0.0–40.0)

## 2016-09-30 LAB — CBC WITH DIFFERENTIAL/PLATELET
BASOS PCT: 1 % (ref 0.0–3.0)
Basophils Absolute: 0 10*3/uL (ref 0.0–0.1)
EOS PCT: 4.1 % (ref 0.0–5.0)
Eosinophils Absolute: 0.2 10*3/uL (ref 0.0–0.7)
HEMATOCRIT: 41.1 % (ref 36.0–46.0)
HEMOGLOBIN: 13.6 g/dL (ref 12.0–15.0)
LYMPHS PCT: 43.9 % (ref 12.0–46.0)
Lymphs Abs: 2 10*3/uL (ref 0.7–4.0)
MCHC: 33.2 g/dL (ref 30.0–36.0)
MCV: 92.6 fl (ref 78.0–100.0)
MONOS PCT: 9.8 % (ref 3.0–12.0)
Monocytes Absolute: 0.5 10*3/uL (ref 0.1–1.0)
NEUTROS ABS: 1.9 10*3/uL (ref 1.4–7.7)
Neutrophils Relative %: 41.2 % — ABNORMAL LOW (ref 43.0–77.0)
PLATELETS: 217 10*3/uL (ref 150.0–400.0)
RBC: 4.44 Mil/uL (ref 3.87–5.11)
RDW: 13.5 % (ref 11.5–15.5)
WBC: 4.6 10*3/uL (ref 4.0–10.5)

## 2016-09-30 LAB — POCT URINALYSIS DIPSTICK
Bilirubin, UA: NEGATIVE
Glucose, UA: NEGATIVE
Ketones, UA: NEGATIVE
NITRITE UA: NEGATIVE
PH UA: 5 (ref 5.0–8.0)
PROTEIN UA: NEGATIVE
SPEC GRAV UA: 1.02 (ref 1.010–1.025)
UROBILINOGEN UA: 0.2 U/dL

## 2016-09-30 LAB — URINALYSIS, MICROSCOPIC ONLY: RBC / HPF: NONE SEEN (ref 0–?)

## 2016-09-30 LAB — TSH: TSH: 2.39 u[IU]/mL (ref 0.35–4.50)

## 2016-09-30 LAB — HEMOGLOBIN A1C: Hgb A1c MFr Bld: 6.6 % — ABNORMAL HIGH (ref 4.6–6.5)

## 2016-10-01 ENCOUNTER — Ambulatory Visit
Admission: RE | Admit: 2016-10-01 | Discharge: 2016-10-01 | Disposition: A | Discharge status: Home / Self Care | Payer: Medicare Other | Source: Ambulatory Visit | Attending: General Surgery | Admitting: General Surgery

## 2016-10-01 DIAGNOSIS — C50911 Malignant neoplasm of unspecified site of right female breast: Secondary | ICD-10-CM

## 2016-10-01 DIAGNOSIS — Z170 Estrogen receptor positive status [ER+]: Secondary | ICD-10-CM | POA: Diagnosis not present

## 2016-10-01 DIAGNOSIS — R922 Inconclusive mammogram: Secondary | ICD-10-CM | POA: Diagnosis not present

## 2016-10-01 HISTORY — DX: Personal history of irradiation: Z92.3

## 2016-10-01 LAB — URINE CULTURE

## 2016-10-02 ENCOUNTER — Ambulatory Visit (INDEPENDENT_AMBULATORY_CARE_PROVIDER_SITE_OTHER): Payer: Medicare Other | Admitting: Internal Medicine

## 2016-10-02 ENCOUNTER — Encounter: Payer: Self-pay | Admitting: Internal Medicine

## 2016-10-02 VITALS — BP 168/86 | HR 69 | Temp 98.5°F | Resp 15 | Ht 61.0 in | Wt 139.8 lb

## 2016-10-02 DIAGNOSIS — E7800 Pure hypercholesterolemia, unspecified: Secondary | ICD-10-CM | POA: Diagnosis not present

## 2016-10-02 DIAGNOSIS — E119 Type 2 diabetes mellitus without complications: Secondary | ICD-10-CM | POA: Diagnosis not present

## 2016-10-02 DIAGNOSIS — I10 Essential (primary) hypertension: Secondary | ICD-10-CM | POA: Diagnosis not present

## 2016-10-02 MED ORDER — VALSARTAN 80 MG PO TABS: 80.0000 mg | ORAL_TABLET | Freq: Two times a day (BID) | ORAL | 1 refills | Status: DC

## 2016-10-02 MED ORDER — LOVASTATIN 20 MG PO TABS: 20.0000 mg | ORAL_TABLET | Freq: Every day | ORAL | 0 refills | Status: DC

## 2016-10-02 NOTE — Progress Notes (Signed)
Subjective:  Patient ID: Kim Sanchez, female    DOB: 23-Jan-1941  Age: 76 y.o. MRN: [id number]  CC: The primary encounter diagnosis was Diabetes mellitus without complication ([place]). Diagnoses of Diabetes mellitus with no complication ([place]), Essential hypertension, and Pure hypercholesterolemia were also pertinent to this visit.  HPI Kim Sanchez presents for new diagnosis of type 2 DM  Diagnosed with a1c of 6.6  , and management of hypertension and hyperlipidemia, complicated by multiple drug intolerances. .  She has stopped taking statin due to perceived effect on hair .  Recent labs dicussed along with risk of CAD based on  FRC.   Lab Results  Component Value Date   HGBA1C 6.6 (H) 09/30/2016   Eye exam was done at [facility]  Last month by baby aspiring advised     Outpatient Medications Prior to Visit  Medication Sig Dispense Refill  . anastrozole (ARIMIDEX) 1 MG tablet Take 1 tablet (1 mg total) by mouth daily. 90 tablet 1  . aspirin EC 81 MG tablet Take 81 mg by mouth daily.    . furosemide (LASIX) 20 MG tablet Take 1 tablet (20 mg total) by mouth daily. 30 tablet 3  . inFLIXimab (REMICADE) 100 MG injection Inject 1 mg into the vein every 8 (eight) weeks.    Marland Kitchen loratadine (CLARITIN) 10 MG tablet Take 1 tablet (10 mg total) by mouth daily. 30 tablet 5  . LORazepam (ATIVAN) 0.5 MG tablet Take 1 tablet (0.5 mg total) by mouth every 8 (eight) hours as needed. for anxiety 90 tablet 5  . Omega-3 Fatty Acids (FISH OIL PO) Take by mouth.    . polyethylene glycol powder (GLYCOLAX/MIRALAX) powder Take by mouth.    . vitamin E 400 UNIT capsule Take by mouth.    . valsartan (DIOVAN) 80 MG tablet Take 1 tablet (80 mg total) by mouth daily. 90 tablet 1   No facility-administered medications prior to visit.     Review of Systems;  Patient denies headache, fevers, malaise, unintentional weight loss, skin rash, eye pain, sinus congestion and sinus pain, sore throat, dysphagia,  hemoptysis  , cough, dyspnea, wheezing, chest pain, palpitations, orthopnea, edema, abdominal pain, nausea, melena, diarrhea, constipation, flank pain, dysuria, hematuria, urinary  Frequency, nocturia, numbness, tingling, seizures,  Focal weakness, Loss of consciousness,  Tremor, insomnia, depression, anxiety, and suicidal ideation.      Objective:  BP (!) 168/86 (BP Location: Left Arm, Patient Position: Sitting, Cuff Size: Normal)   Pulse 69   Temp 98.5 F (36.9 C) (Oral)   Resp 15   Ht 5' 1"  (1.549 m)   Wt 139 lb 12.8 oz (63.4 kg)   SpO2 97%   BMI 26.41 kg/m   BP Readings from Last 3 Encounters:  10/02/16 (!) 168/86  08/18/16 130/62  08/10/16 (!) 159/85    Wt Readings from Last 3 Encounters:  10/02/16 139 lb 12.8 oz (63.4 kg)  08/18/16 140 lb 6.4 oz (63.7 kg)  08/10/16 137 lb 10.8 oz (62.5 kg)    General appearance: alert, cooperative and appears stated age Ears: normal TM's and external ear canals both ears Throat: lips, mucosa, and tongue normal; teeth and gums normal Neck: no adenopathy, no carotid bruit, supple, symmetrical, trachea midline and thyroid not enlarged, symmetric, no tenderness/mass/nodules Back: symmetric, no curvature. ROM normal. No CVA tenderness. Lungs: clear to auscultation bilaterally Heart: regular rate and rhythm, S1, S2 normal, no murmur, click, rub or gallop Abdomen: soft, non-tender; bowel sounds normal;  no masses,  no organomegaly Pulses: 2+ and symmetric Skin: Skin color, texture, turgor normal. No rashes or lesions Lymph nodes: Cervical, supraclavicular, and axillary nodes normal.  Lab Results  Component Value Date   HGBA1C 6.6 (H) 09/30/2016   HGBA1C 5.8 07/02/2016   HGBA1C 6.5 04/01/2016    Lab Results  Component Value Date   CREATININE 0.69 09/30/2016   CREATININE 0.69 05/29/2016   CREATININE 0.70 04/01/2016    Lab Results  Component Value Date   WBC 4.6 09/30/2016   HGB 13.6 09/30/2016   HCT 41.1 09/30/2016   PLT 217.0 09/30/2016    GLUCOSE 117 (H) 09/30/2016   CHOL 251 (H) 09/30/2016   TRIG 81.0 09/30/2016   HDL 78.90 09/30/2016   LDLDIRECT 151.5 10/03/2013   LDLCALC 156 (H) 09/30/2016   ALT 15 09/30/2016   AST 18 09/30/2016   NA 141 09/30/2016   K 3.9 09/30/2016   CL 104 09/30/2016   CREATININE 0.69 09/30/2016   BUN 15 09/30/2016   CO2 31 09/30/2016   TSH 2.39 09/30/2016   HGBA1C 6.6 (H) 09/30/2016   MICROALBUR 0.3 07/30/2016    Mm Diag Breast Tomo Bilateral  Result Date: 10/01/2016 CLINICAL DATA:  History of treated right breast cancer, status post lumpectomy radiation therapy in 2014. EXAM: 2D DIGITAL DIAGNOSTIC BILATERAL MAMMOGRAM WITH CAD AND ADJUNCT TOMO COMPARISON:  Previous exam(s). ACR Breast Density Category c: The breast tissue is heterogeneously dense, which may obscure small masses. FINDINGS: Mammographically, there are no suspicious masses, areas of nonsurgical architectural distortion or microcalcifications in either breast. There are stable posttreatment changes in the right breast. Mammographic images were processed with CAD. IMPRESSION: No mammographic evidence of malignancy in either breast, status post right lumpectomy. RECOMMENDATION: Diagnostic mammogram is suggested in 1 year. (Code:DM-B-01Y) I have discussed the findings and recommendations with the patient. Results were also provided in writing at the conclusion of the visit. If applicable, a reminder letter will be sent to the patient regarding the next appointment. BI-RADS CATEGORY  2: Benign. Electronically Signed   By: Fidela Salisbury M.D.   On: 10/01/2016 10:36    Assessment & Plan:   Problem List Items Addressed This Visit    Hypertension    Not at goal. . Adding a  second dose of valsartan daily in the evening   Lab Results  Component Value Date   CREATININE 0.69 09/30/2016   Lab Results  Component Value Date   NA 141 09/30/2016   K 3.9 09/30/2016   CL 104 09/30/2016   CO2 31 09/30/2016         Relevant Medications    valsartan (DIOVAN) 80 MG tablet   lovastatin (MEVACOR) 20 MG tablet   Hyperlipidemia    She Advised to resume statin if tolerated but has deferred.       Relevant Medications   valsartan (DIOVAN) 80 MG tablet   lovastatin (MEVACOR) 20 MG tablet   Diabetes mellitus with no complication (HCC)    Her fasting  glucose is elevated but not  diagnostic of diabetes; confirmed diagnosis with an A1c.  She does not need medications at this time,  But  She was advised to follow a ;pw GI diet and start a daily walking progrram.  Referral to Diabetes Education advised but deferred. RTC 3 months       Relevant Medications   valsartan (DIOVAN) 80 MG tablet   lovastatin (MEVACOR) 20 MG tablet    Other Visit Diagnoses    Diabetes  mellitus without complication (HCC)    -  Primary   Relevant Medications   valsartan (DIOVAN) 80 MG tablet   lovastatin (MEVACOR) 20 MG tablet   Other Relevant Orders   Hemoglobin A1c   Lipid panel   Microalbumin / creatinine urine ratio   Comprehensive metabolic panel    A total of 25 minutes of face to face time was spent with patient more than half of which was spent in counselling about the above mentioned conditions  and coordination of care    I have changed Ms. Browe's valsartan. I am also having her start on lovastatin. Additionally, I am having her maintain her inFLIXimab, aspirin EC, anastrozole, LORazepam, loratadine, furosemide, Omega-3 Fatty Acids (FISH OIL PO), vitamin E, and polyethylene glycol powder.  Meds ordered this encounter  Medications  . valsartan (DIOVAN) 80 MG tablet    Sig: Take 1 tablet (80 mg total) by mouth 2 (two) times daily.    Dispense:  60 tablet    Refill:  1  . lovastatin (MEVACOR) 20 MG tablet    Sig: Take 1 tablet (20 mg total) by mouth at bedtime.    Dispense:  90 tablet    Refill:  0    Medications Discontinued During This Encounter  Medication Reason  . valsartan (DIOVAN) 80 MG tablet Reorder    Follow-up: Return in  about 3 months (around 01/02/2017) for follow up diabetes labs after sept 3.   Armenta Erskin, Aris Everts, MD

## 2016-10-02 NOTE — Patient Instructions (Addendum)
For blood pressure,   increase valsartan to morning and evening for goal sbp < 140   You do not need medications or to check your sugars at this point because your diabetes is DIET CONTROLLED  We are starting a baby aspirin daily , and lovastatin for cholesterol  Along with CoQ 10   Ok to continue fish oil   I recommend getting the majority of your calcium and Vitamin D  through diet rather than supplements because it is healthier all around for you. Almond/coconut milk is a great low calorie low carb way to increase your dietary calcium and vitamin D.   Without adding cholesterol to your diet   This is  Dr. Lupita Dawn  example of a  "Low GI"  Diet:  It will allow you to lose 4 to 8  lbs  per month if you follow it carefully.  If  Your goal is to reduce the starches in your diet because you have diabetes,  Then just use this as a source ,  Your goal with exercise is a minimum of 30 minutes of aerobic exercise 5 days per week (Walking does not count once it becomes easy!)    All of the foods can be found at grocery stores and in bulk at [employer].  The Atkins protein bars and shakes are available in more varieties at [employer], [employer] and [place].     7 AM Breakfast:  Choose from the following:  Low carbohydrate Protein  Shakes (I recommend the  Premier Protein chocolate shakes,  EAS AdvantEdge "Carb Control" shakes  Or the Atkins shakes all are under 3 net carbs)     a scrambled egg/bacon/cheese burrito made with Mission's "carb balance" whole wheat tortilla  (about 10 net carbs )  Regulatory affairs officer (basically a quiche without the pastry crust) that is eaten cold and very convenient way to get your eggs.  8 carbs)  If you make your own protein shakes, avoid bananas and pineapple,  And use low carb greek yogurt or original /unsweetened almond or soy milk    Avoid cereal and bananas, oatmeal and cream of wheat and grits. They are loaded with carbohydrates!   10 AM: high  protein snack:  Protein bar by Atkins (the snack size, under 200 cal, usually < 6 net carbs).    A stick of cheese:  Around 1 carb,  100 cal     Dannon Light n Fit [place] Yogurt  (80 cal, 8 carbs)  Other so called "protein bars" and Greek yogurts tend to be loaded with carbohydrates.  Remember, in food advertising, the word "energy" is synonymous for " carbohydrate."  Lunch:   A Sandwich using the bread choices listed, Can use any  Eggs,  lunchmeat, grilled meat or canned tuna), avocado, regular mayo/mustard  and cheese.  A Salad using blue cheese, ranch,  Goddess or vinagrette,  Avoid taco shells, croutons or "confetti" and no "candied nuts" but regular nuts OK.   No pretzels, nabs  or chips.  Pickles and miniature sweet peppers are a good low carb alternative that provide a "crunch"  The bread is the only source of carbohydrate in a sandwich and  can be decreased by trying some of the attached alternatives to traditional loaf bread   Avoid "Low fat dressings, as well as Barry Brunner and [employer] dressings They are loaded with sugar!   3 PM/ Mid day  Snack:  Consider  1 ounce of  almonds, walnuts, pistachios, pecans, peanuts,  Macadamia nuts or a nut medley.  Avoid "granola and granola bars "  Mixed nuts are ok in moderation as long as there are no raisins,  cranberries or dried fruit.   KIND bars are OK if you get the low glycemic index variety   Try the prosciutto/mozzarella cheese sticks by [employer]  In deli /backery section   High protein      6 PM  Dinner:     Meat/fowl/fish with a green salad, and either broccoli, cauliflower, green beans, spinach, brussel sprouts or  Lima beans. DO NOT BREAD THE PROTEIN!!      There is a low carb pasta by Dreamfield's that is acceptable and tastes great: only 5 digestible carbs/serving.( All grocery stores but BJs carry it ) Several ready made meals are available low carb:   Try Michel Angelo's chicken piccata or chicken or eggplant parm over low  carb pasta.(Lowes and BJs)   Marjory Lies Sanchez's "Carnitas" (pulled pork, no sauce,  0 carbs) or his beef pot roast to make a dinner burrito (at [employer]J's)  Pesto over low carb pasta (bj's sells a good quality pesto in the center refrigerated section of the deli   Try satueeing  Cheral Marker with mushroooms as a good side   [employer] makes a mashed cauliflower that tastes like mashed potatoes  Whole wheat pasta is still full of digestible carbs and  Not as low in glycemic index as Dreamfield's.   Brown rice is still rice,  So skip the rice and noodles if you eat [place] or [place] (or at least limit to 1/2 cup)  9 PM snack :   Breyer's "low carb" fudgsicle or  ice cream bar (Carb Smart line), or  Weight Watcher's ice cream bar , or another "no sugar added" ice cream;  a serving of fresh berries/cherries with whipped cream   Cheese or DANNON'S LlGHT N FIT GREEK YOGURT  8 ounces of [place] unsweetened almond/cococunut milk    Treat yourself to a parfait made with whipped cream blueberiies, walnuts and vanilla greek yogurt  Avoid bananas, pineapple, grapes  and watermelon on a regular basis because they are high in sugar.  THINK OF THEM AS DESSERT  Remember that snack Substitutions should be less than 10 NET carbs per serving and meals < 20 carbs. Remember to subtract fiber grams to get the "net carbs."

## 2016-10-04 NOTE — Assessment & Plan Note (Signed)
She Advised to resume statin if tolerated but has deferred.

## 2016-10-04 NOTE — Assessment & Plan Note (Signed)
Not at goal. . Adding a  second dose of valsartan daily in the evening   Lab Results  Component Value Date   CREATININE 0.69 09/30/2016   Lab Results  Component Value Date   NA 141 09/30/2016   K 3.9 09/30/2016   CL 104 09/30/2016   CO2 31 09/30/2016

## 2016-10-04 NOTE — Assessment & Plan Note (Signed)
Her fasting  glucose is elevated but not  diagnostic of diabetes; confirmed diagnosis with an A1c.  She does not need medications at this time,  But  She was advised to follow a ;pw GI diet and start a daily walking progrram.  Referral to Diabetes Education advised but deferred. RTC 3 months

## 2016-10-08 ENCOUNTER — Ambulatory Visit: Payer: Medicare Other | Admitting: General Surgery

## 2016-10-08 ENCOUNTER — Encounter: Payer: Self-pay | Admitting: General Surgery

## 2016-10-08 ENCOUNTER — Ambulatory Visit (INDEPENDENT_AMBULATORY_CARE_PROVIDER_SITE_OTHER): Payer: Medicare Other | Admitting: General Surgery

## 2016-10-08 VITALS — BP 138/58 | HR 86 | Resp 12 | Ht 61.5 in | Wt 138.0 lb

## 2016-10-08 DIAGNOSIS — C50811 Malignant neoplasm of overlapping sites of right female breast: Secondary | ICD-10-CM

## 2016-10-08 DIAGNOSIS — Z170 Estrogen receptor positive status [ER+]: Secondary | ICD-10-CM | POA: Diagnosis not present

## 2016-10-08 NOTE — Progress Notes (Signed)
Patient ID: Kim Sanchez, female   DOB: August 20, 1940, 76 y.o.   MRN: [id number]  Chief Complaint  Patient presents with  . Follow-up    mammogram    HPI Kim Sanchez is a 76 y.o. female.  who presents for her breast cancer follow up and a breast evaluation. The most recent mammogram was done on 10-01-16 .  Patient does perform regular self breast checks and gets regular mammograms done.   No new breast issues. Tolerating the Arimidex. She is caring for her husband who is recovering from a stroke.  HPI  Past Medical History:  Diagnosis Date  . Arthritis   . Breast cancer, right breast ([place]) 08/24/2012   Histologic grade 1, invasive mammary carcinoma, no specific type. 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  . Crohn's disease ([place])   . GERD (gastroesophageal reflux disease)   . History of blood transfusion 1960  . Hypertension   . Personal history of radiation therapy     Past Surgical History:  Procedure Laterality Date  . BRAIN SURGERY  1980   breast biopsies, benign  . BREAST BIOPSY Right 2014   +  . BREAST BIOPSY Bilateral    neg  . BREAST LUMPECTOMY Right May 2014   T1c, N0; ER/PR positive, HER-2/neu not overexpressing.  Marland Kitchen BREAST SURGERY  1980's   fibro cyst both breast in [place]  . COLONOSCOPY  2017  . DILATION AND CURETTAGE OF UTERUS      Family History  Problem Relation Age of Onset  . Cancer Paternal Grandmother   . Heart disease Sister   . Diabetes Sister   . Breast cancer Neg Hx     Social History Social History  Substance Use Topics  . Smoking status: Never Smoker  . Smokeless tobacco: Never Used  . Alcohol use 0.6 oz/week    1 Glasses of wine per week     Comment: OCC    Allergies  Allergen Reactions  . Letrozole Anaphylaxis and Itching  . Amlodipine Itching  . Exemestane Nausea Only  . Hydrochlorothiazide Other (See Comments)  . Lexapro [Escitalopram Oxalate] Other (See Comments)   intolerance  . Lisinopril Cough  . Sulfa Antibiotics Other (See Comments)    Current Outpatient Prescriptions  Medication Sig Dispense Refill  . anastrozole (ARIMIDEX) 1 MG tablet Take 1 tablet (1 mg total) by mouth daily. 90 tablet 1  . aspirin EC 81 MG tablet Take 81 mg by mouth daily.    . furosemide (LASIX) 20 MG tablet Take 1 tablet (20 mg total) by mouth daily. 30 tablet 3  . inFLIXimab (REMICADE) 100 MG injection Inject 1 mg into the vein every 8 (eight) weeks.    Marland Kitchen loratadine (CLARITIN) 10 MG tablet Take 1 tablet (10 mg total) by mouth daily. 30 tablet 5  . LORazepam (ATIVAN) 0.5 MG tablet Take 1 tablet (0.5 mg total) by mouth every 8 (eight) hours as needed. for anxiety 90 tablet 5  . lovastatin (MEVACOR) 20 MG tablet Take 1 tablet (20 mg total) by mouth at bedtime. 90 tablet 0  . Omega-3 Fatty Acids (FISH OIL PO) Take by mouth.    . polyethylene glycol powder (GLYCOLAX/MIRALAX) powder Take by mouth.    . valsartan (DIOVAN) 80 MG tablet Take 1 tablet (80 mg total) by mouth 2 (two) times daily. 60 tablet 1  . vitamin E 400 UNIT capsule Take by mouth.     No current facility-administered medications  for this visit.     Review of Systems Review of Systems  Constitutional: Negative.   Respiratory: Negative.   Cardiovascular: Negative.     Blood pressure (!) 138/58, pulse 86, resp. rate 12, height 5' 1.5" (1.562 m), weight 138 lb (62.6 kg), SpO2 98 %.  Physical Exam Physical Exam  Constitutional: She is oriented to person, place, and time. She appears well-developed and well-nourished.  HENT:  Mouth/Throat: Oropharynx is clear and moist.  Eyes: Conjunctivae are normal. No scleral icterus.  Neck: Neck supple.  Cardiovascular: Normal rate and regular rhythm.   Murmur heard.  Systolic murmur is present with a grade of 2/6  Pulmonary/Chest: Effort normal and breath sounds normal. Right breast exhibits no inverted nipple, no mass, no nipple discharge, no skin change and no  tenderness. Left breast exhibits no inverted nipple, no mass, no nipple discharge, no skin change and no tenderness.  Well healed incision right breast.  Lymphadenopathy:    She has no cervical adenopathy.    She has no axillary adenopathy.  Neurological: She is alert and oriented to person, place, and time.  Skin: Skin is warm and dry.  Psychiatric: Her behavior is normal.    Data Reviewed 10/01/2016 bilateral mammograms reviewed. BI-RADS-2.  Bone density testing dated 01/27/2016 shows that she is developed osteopenia in both the spine and femoral neck since her earlier exam completed in 2015.  Dr. Gary Fleet notes of 08/11/2006 report the patient is taking calcium and vitamin D. We'll have this confirmed by the nurse with the patient. He plans a repeat bone density test in September 2018.  Assessment    Benign breast exam.  New-onset osteopenia.    Plan     The patient has developed osteopenia over the last 2 years, likely related to Arimidex therapy.    The patient has been asked to return to the office in one year with a bilateral diagnostic mammogram. One more year on the Arimidex.   The patient will likely be a candidate for Breast Cancer Index testing next year.    HPI, Physical Exam, Assessment and Plan have been scribed under the direction and in the presence of [name], MD.  Karie Fetch, RN  I have completed the exam and reviewed the above documentation for accuracy and completeness.  I agree with the above.  Haematologist has been used and any errors in dictation or transcription are unintentional.  Hervey Ard, M.D., F.A.C.S.   [name] 10/10/2016, 7:52 AM

## 2016-10-08 NOTE — Patient Instructions (Signed)
The patient is aware to call back for any questions or concerns.  

## 2016-10-09 DIAGNOSIS — J069 Acute upper respiratory infection, unspecified: Secondary | ICD-10-CM | POA: Diagnosis not present

## 2016-10-13 ENCOUNTER — Telehealth: Payer: Self-pay | Admitting: *Deleted

## 2016-10-13 NOTE — Telephone Encounter (Signed)
-----   Message from [name], MD sent at 10/10/2016  7:57 AM EDT ----- Please confirm the patient is making use of calcium and vitamin D supplements. Thank you

## 2016-10-13 NOTE — Telephone Encounter (Signed)
She is using Vitamin D3 1000 iu daily. She has tried calcium but it seems to bother her stomach (bloated) she has switched brands to see if that makes a difference as well. I told her I would call her back if any changes or additional information was needed.

## 2016-10-22 DIAGNOSIS — D1801 Hemangioma of skin and subcutaneous tissue: Secondary | ICD-10-CM | POA: Diagnosis not present

## 2016-10-22 DIAGNOSIS — X32XXXA Exposure to sunlight, initial encounter: Secondary | ICD-10-CM | POA: Diagnosis not present

## 2016-10-22 DIAGNOSIS — L814 Other melanin hyperpigmentation: Secondary | ICD-10-CM | POA: Diagnosis not present

## 2016-11-10 ENCOUNTER — Other Ambulatory Visit: Payer: Self-pay | Admitting: Oncology

## 2016-11-11 DIAGNOSIS — K5080 Crohn's disease of both small and large intestine without complications: Secondary | ICD-10-CM | POA: Diagnosis not present

## 2016-11-18 ENCOUNTER — Telehealth: Payer: Self-pay | Admitting: Internal Medicine

## 2016-11-18 NOTE — Telephone Encounter (Signed)
We have checked her urine and kidney function repeatedly due to this concern she has,  and there has been no evidence of problems with urine or kidneys.  I recommend that she continue the furosemide and valsartan but stop the lovastatin.  If she would like her kidney function checked again prior to August 30th (her next earliest blood draw due to a1c timing,  We can repeat a  bmet and UA

## 2016-11-18 NOTE — Telephone Encounter (Signed)
Cholesterol medication is making her having swelling in eyelids, having joint pain since started, she  then started taking every other night.  Still achy and no energy .  Doesn't think swelling in eyelids related to allergies.   Urine is foamy, and bubbly, no dysuria , no urinary frequency, takes furosemide , noticed urine dark is , drinking plenty fluids.    Thinks it may be related to increased dosage of valsartan.  She wants to make sure kidneys are ok.

## 2016-11-18 NOTE — Telephone Encounter (Signed)
Pt called and stated that she is having issues with the cholesterol medication that she is taking. She is having a lot joint pain and she feels swollen and no energy. She is still concerned about her urine it is still bubbly, foamy, and dark and has noticed this since taking blood pressure medication. Please advise, thank you!  Call pt @ [phone]

## 2016-11-18 NOTE — Telephone Encounter (Signed)
Patient advised of below and verbalized understanding. She will discontinue lovastatin.  She will wait until her appointment on August 30 th for repeat  blood draw and UA.  She wants to thank you for addressing her concerns.

## 2016-12-04 ENCOUNTER — Encounter: Payer: Self-pay | Admitting: Internal Medicine

## 2016-12-04 ENCOUNTER — Telehealth: Payer: Self-pay | Admitting: Internal Medicine

## 2016-12-04 MED ORDER — LOSARTAN POTASSIUM 50 MG PO TABS: 50.0000 mg | ORAL_TABLET | Freq: Every day | ORAL | 0 refills | Status: DC

## 2016-12-04 NOTE — Telephone Encounter (Signed)
LMTCB

## 2016-12-04 NOTE — Telephone Encounter (Signed)
Spoke with pt and informed her of the medication change. Pt gave a verbal understanding. Rx has been faxed to [facility] on [address] per pt request.

## 2016-12-04 NOTE — Telephone Encounter (Signed)
Losartan  Once daily rx printed  Too many pharmacies in chart.  Ask patient where it needs ot go and send it.  She can just stop the valsaartan after this morning's dose and start the losartan tonight,  If she has not taken it yet today do not take it ,  Just start losartan once daily

## 2016-12-04 NOTE — Telephone Encounter (Signed)
Please advise 

## 2016-12-04 NOTE — Telephone Encounter (Signed)
Pt called stating that she received a letter in regards to her valsartan (DIOVAN) 80 MG tablet. Pt would like Dr. Derrel Nip is call in something else for her and would like to know how to go about stopping this medication. Please advise, thank you!  Call pt @ [phone]

## 2016-12-18 ENCOUNTER — Ambulatory Visit: Payer: Medicare Other | Admitting: Internal Medicine

## 2016-12-22 ENCOUNTER — Other Ambulatory Visit: Payer: Self-pay

## 2016-12-31 ENCOUNTER — Encounter: Payer: Self-pay | Admitting: Emergency Medicine

## 2016-12-31 ENCOUNTER — Encounter: Payer: Self-pay | Admitting: Internal Medicine

## 2016-12-31 ENCOUNTER — Emergency Department
Admission: EM | Admit: 2016-12-31 | Discharge: 2016-12-31 | Disposition: A | Discharge status: Home / Self Care | Payer: Medicare Other | Attending: Student in an Organized Health Care Education/Training Program | Admitting: Student in an Organized Health Care Education/Training Program

## 2016-12-31 DIAGNOSIS — I10 Essential (primary) hypertension: Secondary | ICD-10-CM | POA: Diagnosis not present

## 2016-12-31 DIAGNOSIS — T50995A Adverse effect of other drugs, medicaments and biological substances, initial encounter: Secondary | ICD-10-CM | POA: Diagnosis not present

## 2016-12-31 DIAGNOSIS — T464X5A Adverse effect of angiotensin-converting-enzyme inhibitors, initial encounter: Secondary | ICD-10-CM | POA: Insufficient documentation

## 2016-12-31 DIAGNOSIS — Z7982 Long term (current) use of aspirin: Secondary | ICD-10-CM | POA: Diagnosis not present

## 2016-12-31 DIAGNOSIS — E119 Type 2 diabetes mellitus without complications: Secondary | ICD-10-CM | POA: Diagnosis not present

## 2016-12-31 DIAGNOSIS — Z79899 Other long term (current) drug therapy: Secondary | ICD-10-CM | POA: Diagnosis not present

## 2016-12-31 DIAGNOSIS — L299 Pruritus, unspecified: Secondary | ICD-10-CM | POA: Diagnosis not present

## 2016-12-31 DIAGNOSIS — T50905A Adverse effect of unspecified drugs, medicaments and biological substances, initial encounter: Secondary | ICD-10-CM

## 2016-12-31 LAB — BASIC METABOLIC PANEL
Anion gap: 7 (ref 5–15)
BUN: 18 mg/dL (ref 6–20)
CHLORIDE: 101 mmol/L (ref 101–111)
CO2: 31 mmol/L (ref 22–32)
Calcium: 9.6 mg/dL (ref 8.9–10.3)
Creatinine, Ser: 0.75 mg/dL (ref 0.44–1.00)
GFR calc Af Amer: >60 mL/min (ref >60)
GFR calc non Af Amer: >60 mL/min (ref >60)
Glucose, Bld: 103 mg/dL — ABNORMAL HIGH (ref 65–99)
POTASSIUM: 3.6 mmol/L (ref 3.5–5.1)
SODIUM: 139 mmol/L (ref 135–145)

## 2016-12-31 LAB — CBC
HCT: 39.7 % (ref 35.0–47.0)
Hemoglobin: 13.5 g/dL (ref 12.0–16.0)
MCH: 31 pg (ref 26.0–34.0)
MCHC: 34 g/dL (ref 32.0–36.0)
MCV: 91.1 fL (ref 80.0–100.0)
Platelets: 215 K/uL (ref 150–440)
RBC: 4.35 MIL/uL (ref 3.80–5.20)
RDW: 13.8 % (ref 11.5–14.5)
WBC: 5.8 K/uL (ref 3.6–11.0)

## 2016-12-31 MED ORDER — AMLODIPINE BESYLATE 5 MG PO TABS: 5.0000 mg | ORAL_TABLET | Freq: Every day | ORAL | 0 refills | Status: DC

## 2016-12-31 NOTE — ED Notes (Signed)
Pt reports recent change to BP medication, taking Losartan since August 4th.  Pt reports noticing worsening side effects over the last few weeks; states "waking up with tingling to legs and hands and swollen eyelids."  Pt ambulatory to room from triage, NAD noted at this time.

## 2016-12-31 NOTE — ED Triage Notes (Signed)
Pt comes into the ED via POV  When she was sent by the [facility] clinic.  Patient c/o a possible medicine reaction.  Patient states she was started on a new BP medication on august 4th and has had increasing symptoms since starting it.  Symptoms include swelling in her wrists along with heat and redness, weakness, and tingling sensation in her legs and hands.  Patient is neurologically intact at this time and has even and unlabored respirations.  Patient states she is very sensitive to medication and [facility] clinic wanted her to be evaluated.

## 2016-12-31 NOTE — Telephone Encounter (Signed)
If eyes are swelling, tingling, etc (with the multiple issues and symptoms she is having) - she needs to go ahead and be evaluated and the can determine best treatment.

## 2016-12-31 NOTE — ED Provider Notes (Signed)
[facility] Emergency Department Provider Note    First MD Initiated Contact with Patient 12/31/16 2034     (approximate)  I have reviewed the triage vital signs and the nursing notes.   HISTORY  Chief Complaint Allergic Reaction    HPI Kim Sanchez is a 76 y.o. female Presents with chief complaint of tingling in her hands and itchiness since she recently switched to a certain on the fourth of this month. Denies any chest pain or shortness of breath. No blurry vision.  Has had multiple intolerances to other medications in the past.     Past Medical History:  Diagnosis Date  . Arthritis   . Breast cancer, right breast ([place]) 08/24/2012   Histologic grade 1, invasive mammary carcinoma, no specific type. 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  . Crohn's disease ([place])   . GERD (gastroesophageal reflux disease)   . History of blood transfusion 1960  . Hypertension   . Personal history of radiation therapy    Family History  Problem Relation Age of Onset  . Cancer Paternal Grandmother   . Heart disease Sister   . Diabetes Sister   . Breast cancer Neg Hx    Past Surgical History:  Procedure Laterality Date  . BRAIN SURGERY  1980   breast biopsies, benign  . BREAST BIOPSY Right 2014   +  . BREAST BIOPSY Bilateral    neg  . BREAST LUMPECTOMY Right May 2014   T1c, N0; ER/PR positive, HER-2/neu not overexpressing.  Marland Kitchen BREAST SURGERY  1980's   fibro cyst both breast in [place]  . COLONOSCOPY  2017  . DILATION AND CURETTAGE OF UTERUS     Patient Active Problem List   Diagnosis Date Noted  . Encounter for preventive health examination 04/04/2016  . Breast cancer, right breast ([place]) 10/03/2014  . History of immunosuppression therapy 09/15/2014  . Generalized anxiety disorder 06/08/2014  . 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 Diabetes mellitus with no complication ([place]) 77/41/2878  .  Hypertension 05/05/2013  . Medicare annual wellness visit, subsequent 01/16/2013  . Arthritis   . GERD (gastroesophageal reflux disease)   . History of blood transfusion   . Hyperlipidemia 11/27/2011  . Crohn's disease ([place])       Prior to Admission medications   Medication Sig Start Date End Date Taking? Authorizing Provider  amLODipine (NORVASC) 5 MG tablet Take 1 tablet (5 mg total) by mouth daily. 12/31/16 12/31/17  Merlyn Lot, MD  anastrozole (ARIMIDEX) 1 MG tablet TAKE 1 TABLET BY MOUTH EVERY DAY 11/10/16   Lloyd Huger, MD  aspirin EC 81 MG tablet Take 81 mg by mouth daily.    [provider]  cholecalciferol (VITAMIN D) 1000 units tablet Take 1,000 Units by mouth daily.    [provider]  furosemide (LASIX) 20 MG tablet Take 1 tablet (20 mg total) by mouth daily. 07/02/16   [name], MD  inFLIXimab (REMICADE) 100 MG injection Inject 1 mg into the vein every 8 (eight) weeks.    [provider]  loratadine (CLARITIN) 10 MG tablet Take 1 tablet (10 mg total) by mouth daily. 07/02/16   [name], MD  LORazepam (ATIVAN) 0.5 MG tablet Take 1 tablet (0.5 mg total) by mouth every 8 (eight) hours as needed. for anxiety 04/01/16   [name], MD  losartan (COZAAR) 50 MG tablet Take 1 tablet (50 mg  total) by mouth daily. 12/04/16   [name], MD  lovastatin (MEVACOR) 20 MG tablet Take 1 tablet (20 mg total) by mouth at bedtime. 10/02/16   [name], MD  Omega-3 Fatty Acids (FISH OIL PO) Take by mouth.    [provider]  polyethylene glycol powder (GLYCOLAX/MIRALAX) powder Take by mouth. 08/14/16   [provider]  vitamin E 400 UNIT capsule Take by mouth.    [provider]    Allergies Letrozole; Amlodipine; Exemestane; Hydrochlorothiazide; Lexapro [escitalopram oxalate]; Lisinopril; and Sulfa antibiotics    Social History Social History  Substance Use Topics  . Smoking status: Never Smoker  .  Smokeless tobacco: Never Used  . Alcohol use 0.6 oz/week    1 Glasses of wine per week     Comment: OCC    Review of Systems Patient denies headaches, rhinorrhea, blurry vision, numbness, shortness of breath, chest pain, edema, cough, abdominal pain, nausea, vomiting, diarrhea, dysuria, fevers, rashes or hallucinations unless otherwise stated above in HPI. ____________________________________________   PHYSICAL EXAM:  VITAL SIGNS: Vitals:   12/31/16 1832 12/31/16 2101  BP: (!) 157/80 (!) 166/80  Pulse: 77 81  Resp: 17 16  Temp: 98.7 F (37.1 C)   SpO2: 100% 98%    Constitutional: Alert and oriented. Well appearing and in no acute distress. Eyes: Conjunctivae are normal.  Head: Atraumatic. Nose: No congestion/rhinnorhea. Mouth/Throat: Mucous membranes are moist.   Neck: Painless ROM.  Cardiovascular:   Good peripheral circulation. Respiratory: Normal respiratory effort.  No retractions.  Gastrointestinal: Soft and nontender.  Musculoskeletal: No lower extremity tenderness .  No joint effusions. Neurologic:  Normal speech and language. No gross focal neurologic deficits are appreciated.  Skin:  Skin is warm, dry and intact. No rash noted. Psychiatric: Mood and affect are normal. Speech and behavior are normal.  ____________________________________________   LABS (all labs ordered are listed, but only abnormal results are displayed)  Results for orders placed or performed during the hospital encounter of 12/31/16 (from the past 24 hour(s))  CBC     Status: None   Collection Time: 12/31/16  6:51 PM  Result Value Ref Range   WBC 5.8 3.6 - 11.0 K/uL   RBC 4.35 3.80 - 5.20 MIL/uL   Hemoglobin 13.5 12.0 - 16.0 g/dL   HCT 39.7 35.0 - 47.0 %   MCV 91.1 80.0 - 100.0 fL   MCH 31.0 26.0 - 34.0 pg   MCHC 34.0 32.0 - 36.0 g/dL   RDW 13.8 11.5 - 14.5 %   Platelets 215 150 - 440 K/uL  Basic metabolic panel     Status: Abnormal   Collection Time: 12/31/16  6:51 PM  Result  Value Ref Range   Sodium 139 135 - 145 mmol/L   Potassium 3.6 3.5 - 5.1 mmol/L   Chloride 101 101 - 111 mmol/L   CO2 31 22 - 32 mmol/L   Glucose, Bld 103 (H) 65 - 99 mg/dL   BUN 18 6 - 20 mg/dL   Creatinine, Ser 0.75 0.44 - 1.00 mg/dL   Calcium 9.6 8.9 - 10.3 mg/dL   GFR calc non Af Amer >60 >60 mL/min   GFR calc Af Amer >60 >60 mL/min   Anion gap 7 5 - 15   ____________________________________________  ____________________________________________  RADIOLOGY   ____________________________________________   PROCEDURES  Procedure(s) performed:  Procedures    Critical Care performed: no ____________________________________________   INITIAL IMPRESSION / ASSESSMENT AND PLAN / ED COURSE  Pertinent  labs & imaging results that were available during my care of the patient were reviewed by me and considered in my medical decision making (see chart for details).  DDX: medication reaction, edema, anaphylaxis, angioedema  ROSELY FERNANDEZ is a 76 y.o. who presents to the ED with what appears to be some form of intolerance to her medications. She is otherwise well-appearing. She has no objective signs of anaphylaxis or allergic reaction. No evidence of edema. No evidence of CVA or hypertensive urgency or emergency at this time. I discussed lternative options including holding her losartan with just increasing her HCTZ versus giving another trial of amlodipine. Patient states that she does have a history of itching with amlodipine but would like to try that again. She has no evidence of emergent condition at this point. No clinical or physical exam evidence of hypertensive emergency. Patient stable for follow-up with pcp.      ____________________________________________   FINAL CLINICAL IMPRESSION(S) / ED DIAGNOSES  Final diagnoses:  Medication reaction, initial encounter      NEW MEDICATIONS STARTED DURING THIS VISIT:  Discharge Medication List as of 12/31/2016  8:58 PM      START taking these medications   Details  amLODipine (NORVASC) 5 MG tablet Take 1 tablet (5 mg total) by mouth daily., Starting Thu 12/31/2016, Until Fri 12/31/2017, Print         Note:  This document was prepared using Dragon voice recognition software and may include unintentional dictation errors.     Merlyn Lot, MD 12/31/16 2221

## 2016-12-31 NOTE — Discharge Instructions (Signed)
Follow up with Dr. Derrel Nip.  Return immediately for any chest pain, shortness of breath, blurry vision, or numbness or tingling.

## 2017-01-05 ENCOUNTER — Other Ambulatory Visit (INDEPENDENT_AMBULATORY_CARE_PROVIDER_SITE_OTHER): Payer: Medicare Other

## 2017-01-05 DIAGNOSIS — E119 Type 2 diabetes mellitus without complications: Secondary | ICD-10-CM | POA: Diagnosis not present

## 2017-01-05 LAB — COMPREHENSIVE METABOLIC PANEL
ALBUMIN: 4.3 g/dL (ref 3.5–5.2)
ALK PHOS: 74 U/L (ref 39–117)
ALT: 11 U/L (ref 0–35)
AST: 16 U/L (ref 0–37)
BUN: 21 mg/dL (ref 6–23)
CALCIUM: 9.6 mg/dL (ref 8.4–10.5)
CO2: 27 meq/L (ref 19–32)
Chloride: 102 mEq/L (ref 96–112)
Creatinine, Ser: 0.74 mg/dL (ref 0.40–1.20)
GFR: 81.16 mL/min (ref >60.00)
GLUCOSE: 116 mg/dL — AB (ref 70–99)
POTASSIUM: 3.8 meq/L (ref 3.5–5.1)
Sodium: 139 mEq/L (ref 135–145)
Total Bilirubin: 1.2 mg/dL (ref 0.2–1.2)
Total Protein: 7.3 g/dL (ref 6.0–8.3)

## 2017-01-05 LAB — LIPID PANEL
CHOL/HDL RATIO: 4
Cholesterol: 263 mg/dL — ABNORMAL HIGH (ref 0–200)
HDL: 72.2 mg/dL (ref >39.00)
LDL CALC: 176 mg/dL — AB (ref 0–99)
NonHDL: 191.13
TRIGLYCERIDES: 74 mg/dL (ref 0.0–149.0)
VLDL: 14.8 mg/dL (ref 0.0–40.0)

## 2017-01-05 LAB — HEMOGLOBIN A1C: Hgb A1c MFr Bld: 6.4 % (ref 4.6–6.5)

## 2017-01-05 LAB — MICROALBUMIN / CREATININE URINE RATIO
CREATININE, U: 261.4 mg/dL
MICROALB UR: 1.2 mg/dL (ref 0.0–1.9)
Microalb Creat Ratio: 0.5 mg/g (ref 0.0–30.0)

## 2017-01-06 ENCOUNTER — Encounter: Payer: Self-pay | Admitting: Family

## 2017-01-06 ENCOUNTER — Ambulatory Visit (INDEPENDENT_AMBULATORY_CARE_PROVIDER_SITE_OTHER): Payer: Medicare Other | Admitting: Family

## 2017-01-06 VITALS — BP 145/78 | HR 75 | Temp 98.9°F | Ht 62.0 in | Wt 137.6 lb

## 2017-01-06 DIAGNOSIS — I10 Essential (primary) hypertension: Secondary | ICD-10-CM | POA: Diagnosis not present

## 2017-01-06 MED ORDER — HYDROCHLOROTHIAZIDE 12.5 MG PO CAPS: 12.5000 mg | ORAL_CAPSULE | Freq: Every day | ORAL | 1 refills | Status: DC

## 2017-01-06 NOTE — Progress Notes (Signed)
Subjective:    Patient ID: Kim Sanchez, female    DOB: 1940/05/08, 76 y.o.   MRN: [id number]  CC: Kim Sanchez is a 76 y.o. female who presents today for an acute visit.    HPI: Elevated blood pressure- Not taking losartan, lasix; swelling around eyes, ankles has resolved since stopping. Also felt tired al the time.   Occasionally has LE swelling and uses lasix.   BP at home 146/80, 135/69.  never started amlodipine as 'felt much better'.   States she got in an argument with her spouse this morning  Denies exertional chest pain or pressure, orthopnea,  numbness or tingling radiating to left arm or jaw, palpitations, dizziness, frequent headaches, changes in vision, or shortness of breath.   No h/o CHF.       ED 8/30 Started on amlodipine 65m.    HISTORY:  Past Medical History:  Diagnosis Date  . Arthritis   . Breast cancer, right breast ([place] 08/24/2012   Histologic grade 1, invasive mammary carcinoma, no specific type. 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  . [place] [place]   . GERD (gastroesophageal reflux disease)   . History of blood transfusion 1960  . Hypertension   . Personal history of radiation therapy    Past Surgical History:  Procedure Laterality Date  . BRAIN SURGERY  1980   breast biopsies, benign  . BREAST BIOPSY Right 2014   +  . BREAST BIOPSY Bilateral    neg  . BREAST LUMPECTOMY Right May 2014   T1c, N0; ER/PR positive, HER-2/neu not overexpressing.  .Marland KitchenBREAST SURGERY  1980's   [place] . COLONOSCOPY  2017  . DILATION AND CURETTAGE OF UTERUS     Family History  Problem Relation Age of Onset  . Cancer Paternal Grandmother   . Heart disease Sister   . Diabetes Sister   . Breast cancer Neg Hx     Allergies: Letrozole; Amlodipine; Exemestane; Hydrochlorothiazide; Lexapro [escitalopram oxalate]; Lisinopril; and Sulfa antibiotics Current Outpatient  Prescriptions on File Prior to Visit  Medication Sig Dispense Refill  . anastrozole (ARIMIDEX) 1 MG tablet TAKE 1 TABLET BY MOUTH EVERY DAY 90 tablet 0  . aspirin EC 81 MG tablet Take 81 mg by mouth daily.    . cholecalciferol (VITAMIN D) 1000 units tablet Take 1,000 Units by mouth daily.    .Marland KitcheninFLIXimab (REMICADE) 100 MG injection Inject 1 mg into the vein every 8 (eight) weeks.    .Marland Kitchenloratadine (CLARITIN) 10 MG tablet Take 1 tablet (10 mg total) by mouth daily. 30 tablet 5  . LORazepam (ATIVAN) 0.5 MG tablet Take 1 tablet (0.5 mg total) by mouth every 8 (eight) hours as needed. for anxiety 90 tablet 5  . lovastatin (MEVACOR) 20 MG tablet Take 1 tablet (20 mg total) by mouth at bedtime. 90 tablet 0  . Omega-3 Fatty Acids (FISH OIL PO) Take by mouth.    . polyethylene glycol powder (GLYCOLAX/MIRALAX) powder Take by mouth.    . vitamin E 400 UNIT capsule Take by mouth.    . [DISCONTINUED] losartan-hydrochlorothiazide (HYZAAR) 50-12.5 MG tablet Take 1 tablet by mouth daily. 90 tablet 0   No current facility-administered medications on file prior to visit.     Social History  Substance Use Topics  . Smoking status: Never Smoker  . Smokeless tobacco: Never Used  . Alcohol use 0.6 oz/week    1  Glasses of wine per week     Comment: OCC    Review of Systems  Constitutional: Negative for chills and fever.  Respiratory: Negative for cough.   Cardiovascular: Negative for chest pain, palpitations and leg swelling (none today).  Gastrointestinal: Negative for nausea and vomiting.      Objective:    BP (!) 145/78   Pulse 75   Temp 98.9 F (37.2 C) (Oral)   Ht 5' 2"  (1.575 m)   Wt 137 lb 9.6 oz (62.4 kg)   SpO2 98%   BMI 25.17 kg/m    Physical Exam  Constitutional: She appears well-developed and well-nourished.  Eyes: Conjunctivae are normal.  Cardiovascular: Normal rate, regular rhythm, normal heart sounds and normal pulses.   No LE edema, palpable cords or masses. No erythema or  increased warmth. No asymmetry in calf size when compared bilaterally LE hair growth symmetric and present. No discoloration of varicosities noted. LE warm and palpable pedal pulses.   Pulmonary/Chest: Effort normal and breath sounds normal. She has no wheezes. She has no rhonchi. She has no rales.  Neurological: She is alert.  Skin: Skin is warm and dry.  Psychiatric: She has a normal mood and affect. Her speech is normal and behavior is normal. Thought content normal.  Vitals reviewed.      Assessment & Plan:   Problem List Items Addressed This Visit      Cardiovascular and Mediastinum   Hypertension - Primary    Elevated today however came down significantly after a couple minutes in room. Off all medications. Extensive allergies to blood pressure medications. Agreeable to start back on HCTZ for HTN control and also to see if helps with occasional LE edema. Advised to hold lasix for now. Return precautions given. Repeat bmp in 3-5 days.         Relevant Medications   hydrochlorothiazide (MICROZIDE) 12.5 MG capsule   Other Relevant Orders   Basic metabolic panel         I have discontinued Ms. Dioguardi's furosemide, losartan, and amLODipine. I am also having her start on hydrochlorothiazide. Additionally, I am having her maintain her inFLIXimab, aspirin EC, LORazepam, loratadine, Omega-3 Fatty Acids (FISH OIL PO), vitamin E, polyethylene glycol powder, lovastatin, cholecalciferol, and anastrozole.   Meds ordered this encounter  Medications  . hydrochlorothiazide (MICROZIDE) 12.5 MG capsule    Sig: Take 1 capsule (12.5 mg total) by mouth daily.    Dispense:  90 capsule    Refill:  1    Order Specific Question:   Supervising Provider    Answer:   [name] [[id number]]    Return precautions given.   Risks, benefits, and alternatives of the medications and treatment plan prescribed today were discussed, and patient expressed understanding.   Education regarding symptom  management and diagnosis given to patient on AVS.  Continue to follow with [name], MD for routine health maintenance.   Kim Sanchez and I agreed with plan.   Mable Paris, FNP

## 2017-01-06 NOTE — Addendum Note (Signed)
Addended by: Elpidio Galea T on: 01/06/2017 09:18 AM   Modules accepted: Orders

## 2017-01-06 NOTE — Patient Instructions (Signed)
Labs on Monday- please make an appt at front desk  Start hctz  Hold lasix for now - lets see if HCTZ manages occasional fluid in  Legs  Monitor blood pressure,  Goal is less than 130/80; if persistently higher, please make sooner follow up appointment so we can recheck you blood pressure and manage medications  Follow up 3 months

## 2017-01-06 NOTE — Assessment & Plan Note (Addendum)
Elevated today however came down significantly after a couple minutes in room. Off all medications. Extensive allergies to blood pressure medications. Agreeable to start back on HCTZ for HTN control and also to see if helps with occasional LE edema. Advised to hold lasix for now. Return precautions given. Repeat bmp in 3-5 days.

## 2017-01-07 ENCOUNTER — Encounter: Payer: Self-pay | Admitting: Internal Medicine

## 2017-01-07 ENCOUNTER — Ambulatory Visit: Payer: Medicare Other | Admitting: Internal Medicine

## 2017-01-07 DIAGNOSIS — K5080 Crohn's disease of both small and large intestine without complications: Secondary | ICD-10-CM | POA: Diagnosis not present

## 2017-01-11 ENCOUNTER — Other Ambulatory Visit: Payer: Medicare Other

## 2017-01-11 ENCOUNTER — Other Ambulatory Visit (INDEPENDENT_AMBULATORY_CARE_PROVIDER_SITE_OTHER): Payer: Medicare Other

## 2017-01-11 DIAGNOSIS — I10 Essential (primary) hypertension: Secondary | ICD-10-CM

## 2017-01-11 LAB — BASIC METABOLIC PANEL
BUN: 15 mg/dL (ref 6–23)
CHLORIDE: 103 meq/L (ref 96–112)
CO2: 22 meq/L (ref 19–32)
Calcium: 9.4 mg/dL (ref 8.4–10.5)
Creatinine, Ser: 0.76 mg/dL (ref 0.40–1.20)
GFR: 78.7 mL/min (ref >60.00)
GLUCOSE: 117 mg/dL — AB (ref 70–99)
POTASSIUM: 3.8 meq/L (ref 3.5–5.1)
Sodium: 138 mEq/L (ref 135–145)

## 2017-01-13 ENCOUNTER — Ambulatory Visit (INDEPENDENT_AMBULATORY_CARE_PROVIDER_SITE_OTHER): Payer: Medicare Other | Admitting: Internal Medicine

## 2017-01-13 ENCOUNTER — Encounter: Payer: Self-pay | Admitting: Internal Medicine

## 2017-01-13 DIAGNOSIS — K5010 Crohn's disease of large intestine without complications: Secondary | ICD-10-CM

## 2017-01-13 DIAGNOSIS — I10 Essential (primary) hypertension: Secondary | ICD-10-CM | POA: Diagnosis not present

## 2017-01-13 DIAGNOSIS — E7800 Pure hypercholesterolemia, unspecified: Secondary | ICD-10-CM | POA: Diagnosis not present

## 2017-01-13 DIAGNOSIS — E119 Type 2 diabetes mellitus without complications: Secondary | ICD-10-CM

## 2017-01-13 DIAGNOSIS — F411 Generalized anxiety disorder: Secondary | ICD-10-CM

## 2017-01-13 DIAGNOSIS — Z23 Encounter for immunization: Secondary | ICD-10-CM | POA: Diagnosis not present

## 2017-01-13 MED ORDER — ROSUVASTATIN CALCIUM 5 MG PO TABS: 5.0000 mg | ORAL_TABLET | Freq: Every day | ORAL | 3 refills | Status: DC

## 2017-01-13 NOTE — Patient Instructions (Addendum)
For your blood pressure:  Continue HCTZ but  take it in the morning instead of evening.   If AFTER ONE WEEK,  Your blood pressures  Are still all above 130/80 ,  ADD AMLODIPINE 2.5 MG (1/2 TABLET) AT BEDTIME OR EVENING   FOR THE CHOLESTEROL:   trial of rosuvastatin  5 mg daily at bedtime  (generic for Crestor)   I will see you again in 3 months

## 2017-01-13 NOTE — Progress Notes (Signed)
Subjective:  Patient ID: Kim Sanchez, female    DOB: 15-Jun-1940  Age: 76 y.o. MRN: [id number]  CC: Diagnoses of Encounter for immunization, Crohn's disease of large intestine without complication ([place]), Diabetes mellitus with no complication ([place]), Pure hypercholesterolemia, Generalized anxiety disorder, and Essential hypertension were pertinent to this visit.  HPI Kim Sanchez presents for 3 month follow up on newly diagnosed type 2 diet controlled diabetes, hypertension and GAD .  Marland Kitchen  Patient continues to suffer from anxiety aggravated by her husbands untreated depression and disinclination to improve his health. She also continues to struggle with  tolerating medications. She worries about her blood pressure and cholesterol,  But has multiple medication intolerances.  She recently stopped her statin because of hair loss.    DM:  She  is following a low glycemic index diet and taking all prescribed medications regularly without side effects.  Fasting sugars have been under less than 140 most of the time and post prandials have been under 160 except on rare occasions. Patient is exercising about 3 times per week and intentionally trying to lose weight .  Patient has had an eye exam in the last 12 months and checks feet regularly for signs of infection.  Patient does not walk barefoot outside,  And denies any numbness tingling or burning in feet. Patient is up to date on all recommended vaccinations  Hypertension:  Seen in ED for ELEVATED BP of 166/80 Aug 30th,  Amlodipine prescribed but not started.  Saw MA on Sept 5th,  HCTZ prescribed for HTN and edema.  Started medication , Lytes normal on recheck and cr stable.  TAKING HCTZ IN THE EVENING,  NO NOCTURIA,   BUT MORNING READINGS ARE still elevated    With systolic in the  536'I/ 80     Outpatient Medications Prior to Visit  Medication Sig Dispense Refill  . anastrozole (ARIMIDEX) 1 MG tablet TAKE 1 TABLET BY MOUTH EVERY DAY 90 tablet 0  .  aspirin EC 81 MG tablet Take 81 mg by mouth daily.    . cholecalciferol (VITAMIN D) 1000 units tablet Take 1,000 Units by mouth daily.    . hydrochlorothiazide (MICROZIDE) 12.5 MG capsule Take 1 capsule (12.5 mg total) by mouth daily. 90 capsule 1  . inFLIXimab (REMICADE) 100 MG injection Inject 1 mg into the vein every 8 (eight) weeks.    Marland Kitchen loratadine (CLARITIN) 10 MG tablet Take 1 tablet (10 mg total) by mouth daily. 30 tablet 5  . LORazepam (ATIVAN) 0.5 MG tablet Take 1 tablet (0.5 mg total) by mouth every 8 (eight) hours as needed. for anxiety 90 tablet 5  . Omega-3 Fatty Acids (FISH OIL PO) Take by mouth.    . polyethylene glycol powder (GLYCOLAX/MIRALAX) powder Take by mouth.    . vitamin E 400 UNIT capsule Take by mouth.    . lovastatin (MEVACOR) 20 MG tablet Take 1 tablet (20 mg total) by mouth at bedtime. (Patient not taking: Reported on 01/13/2017) 90 tablet 0   No facility-administered medications prior to visit.     Review of Systems;  Patient denies headache, fevers, malaise, unintentional weight loss, skin rash, eye pain, sinus congestion and sinus pain, sore throat, dysphagia,  hemoptysis , cough, dyspnea, wheezing, chest pain, palpitations, orthopnea, edema, abdominal pain, nausea, melena, diarrhea, constipation, flank pain, dysuria, hematuria, urinary  Frequency, nocturia, numbness, tingling, seizures,  Focal weakness, Loss of consciousness,  Tremor, insomnia, depression, anxiety, and suicidal ideation.  Objective:  BP (!) 148/82 (BP Location: Left Arm, Patient Position: Sitting, Cuff Size: Normal)   Pulse 69   Temp 98.1 F (36.7 C) (Oral)   Resp 15   Ht 5' 2"  (1.575 m)   Wt 137 lb 6.4 oz (62.3 kg)   SpO2 96%   BMI 25.13 kg/m   BP Readings from Last 3 Encounters:  01/13/17 (!) 148/82  01/06/17 (!) 145/78  12/31/16 (!) 166/80    Wt Readings from Last 3 Encounters:  01/13/17 137 lb 6.4 oz (62.3 kg)  01/06/17 137 lb 9.6 oz (62.4 kg)  12/31/16 138 lb (62.6 kg)     General appearance: alert, cooperative and appears stated age Ears: normal TM's and external ear canals both ears Throat: lips, mucosa, and tongue normal; teeth and gums normal Neck: no adenopathy, no carotid bruit, supple, symmetrical, trachea midline and thyroid not enlarged, symmetric, no tenderness/mass/nodules Back: symmetric, no curvature. ROM normal. No CVA tenderness. Lungs: clear to auscultation bilaterally Heart: regular rate and rhythm, S1, S2 normal, no murmur, click, rub or gallop Abdomen: soft, non-tender; bowel sounds normal; no masses,  no organomegaly Pulses: 2+ and symmetric Skin: Skin color, texture, turgor normal. No rashes or lesions Lymph nodes: Cervical, supraclavicular, and axillary nodes normal.  Lab Results  Component Value Date   HGBA1C 6.4 01/05/2017   HGBA1C 6.6 (H) 09/30/2016   HGBA1C 5.8 07/02/2016    Lab Results  Component Value Date   CREATININE 0.76 01/11/2017   CREATININE 0.74 01/05/2017   CREATININE 0.75 12/31/2016    Lab Results  Component Value Date   WBC 5.8 12/31/2016   HGB 13.5 12/31/2016   HCT 39.7 12/31/2016   PLT 215 12/31/2016   GLUCOSE 117 (H) 01/11/2017   CHOL 263 (H) 01/05/2017   TRIG 74.0 01/05/2017   HDL 72.20 01/05/2017   LDLDIRECT 151.5 10/03/2013   LDLCALC 176 (H) 01/05/2017   ALT 11 01/05/2017   AST 16 01/05/2017   NA 138 01/11/2017   K 3.8 01/11/2017   CL 103 01/11/2017   CREATININE 0.76 01/11/2017   BUN 15 01/11/2017   CO2 22 01/11/2017   TSH 2.39 09/30/2016   HGBA1C 6.4 01/05/2017   MICROALBUR 1.2 01/05/2017    No results found.  Assessment & Plan:   Problem List Items Addressed This Visit    Crohn's disease ([place])   Diabetes mellitus with no complication ([place])    Remains well-controlled on diet alone. Intolerant of ACE I due to cough.  Statin therapy advised but not continued due to c/o hair loss in the setting of increased emotional stress.  She is unwilling to try low dose rosuvastatin bc of her  husband's intolerance but is willing to try pravastatin at the lowest dose.  Lab Results  Component Value Date   HGBA1C 6.4 05/17/2014   Lab Results  Component Value Date   MICROALBUR 1.7 05/17/2014   Lab Results  Component Value Date   CHOL 184 05/17/2014   HDL 30.20* 05/17/2014   LDLCALC 66 08/21/2013   LDLDIRECT 84.0 05/17/2014   TRIG 324.0* 05/17/2014   CHOLHDL 6 05/17/2014    . Patient is due for annual eye exam and foot exam is normal today. Patient  Has no proetinuria.. Patient is has acceptable LDL but triglcyerides are elevated.  Will address with low GI diet and add fenfibrate if not improved at next follow up       Generalized anxiety disorder    She has resumed prn use of  lorazepam after unsuccessful trial of lexapro       Hyperlipidemia    Statin therapy advised but not continued due to c/o hair loss in the setting of increased emotional stress.  She is unwilling to try low dose rosuvastatin bc of her husband's intolerance but is willing to try pravastatin at the lowest dose.    Lab Results  Component Value Date   CHOL 263 (H) 01/05/2017   HDL 72.20 01/05/2017   LDLCALC 176 (H) 01/05/2017   LDLDIRECT 151.5 10/03/2013   TRIG 74.0 01/05/2017   CHOLHDL 4 01/05/2017         Hypertension    Advised to take HCTZ in the am and start amldoipine for sbp > 140 after a week       Other Visit Diagnoses    Encounter for immunization       Relevant Orders   Flu vaccine HIGH DOSE PF (Completed)    A total of 25 minutes of face to face time was spent with patient more than half of which was spent in counselling about the above mentioned conditions  and coordination of care   I have discontinued Ms. Suniga's lovastatin. I am also having her maintain her inFLIXimab, aspirin EC, LORazepam, loratadine, Omega-3 Fatty Acids (FISH OIL PO), vitamin E, polyethylene glycol powder, cholecalciferol, anastrozole, and hydrochlorothiazide.  Meds ordered this encounter    Medications  . DISCONTD: rosuvastatin (CRESTOR) 5 MG tablet    Sig: Take 1 tablet (5 mg total) by mouth daily.    Dispense:  30 tablet    Refill:  3    Medications Discontinued During This Encounter  Medication Reason  . lovastatin (MEVACOR) 20 MG tablet Patient has not taken in last 30 days    Follow-up: Return in about 3 months (around 04/14/2017).   [name], MD

## 2017-01-14 ENCOUNTER — Encounter: Payer: Self-pay | Admitting: Internal Medicine

## 2017-01-14 ENCOUNTER — Other Ambulatory Visit: Payer: Self-pay | Admitting: Internal Medicine

## 2017-01-14 MED ORDER — PRAVASTATIN SODIUM 20 MG PO TABS: 20.0000 mg | ORAL_TABLET | Freq: Every day | ORAL | 5 refills | Status: DC

## 2017-01-15 NOTE — Assessment & Plan Note (Signed)
Statin therapy advised but not continued due to c/o hair loss in the setting of increased emotional stress.  She is unwilling to try low dose rosuvastatin bc of her husband's intolerance but is willing to try pravastatin at the lowest dose.    Lab Results  Component Value Date   CHOL 263 (H) 01/05/2017   HDL 72.20 01/05/2017   LDLCALC 176 (H) 01/05/2017   LDLDIRECT 151.5 10/03/2013   TRIG 74.0 01/05/2017   CHOLHDL 4 01/05/2017

## 2017-01-15 NOTE — Assessment & Plan Note (Addendum)
Remains well-controlled on diet alone. Intolerant of ACE I due to cough.  Statin therapy advised but not continued due to c/o hair loss in the setting of increased emotional stress.  She is unwilling to try low dose rosuvastatin bc of her husband's intolerance but is willing to try pravastatin at the lowest dose.  Lab Results  Component Value Date   HGBA1C 6.4 05/17/2014   Lab Results  Component Value Date   MICROALBUR 1.7 05/17/2014   Lab Results  Component Value Date   CHOL 184 05/17/2014   HDL 30.20* 05/17/2014   LDLCALC 66 08/21/2013   LDLDIRECT 84.0 05/17/2014   TRIG 324.0* 05/17/2014   CHOLHDL 6 05/17/2014    . Patient is due for annual eye exam and foot exam is normal today. Patient  Has no proetinuria.. Patient is has acceptable LDL but triglcyerides are elevated.  Will address with low GI diet and add fenfibrate if not improved at next follow up

## 2017-01-15 NOTE — Assessment & Plan Note (Signed)
Advised to take HCTZ in the am and start amldoipine for sbp > 140 after a week

## 2017-01-15 NOTE — Assessment & Plan Note (Signed)
She has resumed prn use of lorazepam after unsuccessful trial of lexapro

## 2017-01-18 ENCOUNTER — Encounter: Payer: Self-pay | Admitting: Internal Medicine

## 2017-01-18 MED ORDER — PRAVASTATIN SODIUM 20 MG PO TABS: 20.0000 mg | ORAL_TABLET | Freq: Every day | ORAL | 5 refills | Status: DC

## 2017-01-25 ENCOUNTER — Other Ambulatory Visit: Payer: Self-pay | Admitting: Internal Medicine

## 2017-01-28 ENCOUNTER — Ambulatory Visit: Payer: Medicare Other

## 2017-02-01 ENCOUNTER — Encounter: Payer: Self-pay | Admitting: Family

## 2017-02-01 ENCOUNTER — Other Ambulatory Visit: Payer: Self-pay | Admitting: *Deleted

## 2017-02-01 ENCOUNTER — Ambulatory Visit (INDEPENDENT_AMBULATORY_CARE_PROVIDER_SITE_OTHER): Payer: Medicare Other | Admitting: Family

## 2017-02-01 VITALS — BP 150/72 | HR 83 | Temp 98.3°F | Ht 62.0 in | Wt 138.8 lb

## 2017-02-01 DIAGNOSIS — E7800 Pure hypercholesterolemia, unspecified: Secondary | ICD-10-CM

## 2017-02-01 DIAGNOSIS — I10 Essential (primary) hypertension: Secondary | ICD-10-CM | POA: Diagnosis not present

## 2017-02-01 MED ORDER — AMLODIPINE BESYLATE 2.5 MG PO TABS: 2.5000 mg | ORAL_TABLET | Freq: Every day | ORAL | 3 refills | Status: DC

## 2017-02-01 MED ORDER — ATORVASTATIN CALCIUM 10 MG PO TABS: 10.0000 mg | ORAL_TABLET | Freq: Every day | ORAL | 3 refills | Status: DC

## 2017-02-01 MED ORDER — ANASTROZOLE 1 MG PO TABS: 1.0000 mg | ORAL_TABLET | Freq: Every day | ORAL | 0 refills | Status: DC

## 2017-02-01 NOTE — Patient Instructions (Addendum)
Trial amlodipine 2.35m, if not at goal, may increase to 575monce per day  Monitor blood pressure,  Goal is less than 130/80; if persistently higher, please make sooner follow up appointment so we can recheck you blood pressure and manage medications  Trial of lipitor once per week and then increase from there  Let usKoreanow how you do!

## 2017-02-01 NOTE — Progress Notes (Signed)
Pre visit review using our clinic review tool, if applicable. No additional management support is needed unless otherwise documented below in the visit note. 

## 2017-02-01 NOTE — Progress Notes (Signed)
Subjective:    Patient ID: Kim Sanchez, female    DOB: 11-Feb-1941, 76 y.o.   MRN: [id number]  CC: Kim Sanchez is a 76 y.o. female who presents today for follow up.   HPI: HTN-  Start of hydrochlorothiazide 1 month ago. Feels nauseated on medication . Off the hctz. Not taking amlodipine.  Advised by pcp to take hctz in am and amlodipine > 140 after one week.  At home, BP runs 142/72.  Denies exertional chest pain or pressure, numbness or tingling radiating to left arm or jaw, palpitations, dizziness, frequent headaches, changes in vision, or shortness of breath.   HLD- took 2 doses of pravastatin and felt 'head pressure'.       HISTORY:  Past Medical History:  Diagnosis Date  . Arthritis   . 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  . Crohn's disease ([place])   . GERD (gastroesophageal reflux disease)   . History of blood transfusion 1960  . Hypertension   . Personal history of radiation therapy    Past Surgical History:  Procedure Laterality Date  . BRAIN SURGERY  1980   breast biopsies, benign  . BREAST BIOPSY Right 2014   +  . BREAST BIOPSY Bilateral    neg  . BREAST LUMPECTOMY Right May 2014   T1c, N0; ER/PR positive, HER-2/neu not overexpressing.  Marland Kitchen BREAST SURGERY  1980's   fibro cyst both breast in [place]  . COLONOSCOPY  2017  . DILATION AND CURETTAGE OF UTERUS     Family History  Problem Relation Age of Onset  . Cancer Paternal Grandmother   . Heart disease Sister   . Diabetes Sister   . Breast cancer Neg Hx     Allergies: Letrozole; Amlodipine; Exemestane; Hydrochlorothiazide; Lexapro [escitalopram oxalate]; Lisinopril; and Sulfa antibiotics Current Outpatient Prescriptions on File Prior to Visit  Medication Sig Dispense Refill  . aspirin EC 81 MG tablet Take 81 mg by mouth daily.    .  cholecalciferol (VITAMIN D) 1000 units tablet Take 1,000 Units by mouth daily.    Marland Kitchen inFLIXimab (REMICADE) 100 MG injection Inject 1 mg into the vein every 8 (eight) weeks.    Marland Kitchen loratadine (CLARITIN) 10 MG tablet Take 1 tablet (10 mg total) by mouth daily. 30 tablet 5  . LORazepam (ATIVAN) 0.5 MG tablet TAKE 1 TABLET BY MOUTH EVERY 8 HOURS AS NEEDED FOR ANXIETY 90 tablet 0  . Omega-3 Fatty Acids (FISH OIL PO) Take by mouth.    . polyethylene glycol powder (GLYCOLAX/MIRALAX) powder Take by mouth.    . vitamin E 400 UNIT capsule Take by mouth.    . [DISCONTINUED] losartan-hydrochlorothiazide (HYZAAR) 50-12.5 MG tablet Take 1 tablet by mouth daily. 90 tablet 0   No current facility-administered medications on file prior to visit.     Social History  Substance Use Topics  . Smoking status: Never Smoker  . Smokeless tobacco: Never Used  . Alcohol use 0.6 oz/week    1 Glasses of wine per week     Comment: OCC    Review of Systems  Constitutional: Negative for chills and fever.  Respiratory: Negative for cough.   Cardiovascular: Negative for chest pain and palpitations.  Gastrointestinal: Negative for nausea and vomiting.      Objective:    BP (!) 150/72   Pulse 83   Temp  98.3 F (36.8 C) (Oral)   Ht 5' 2"  (1.575 m)   Wt 138 lb 12.8 oz (63 kg)   SpO2 94%   BMI 25.39 kg/m  BP Readings from Last 3 Encounters:  02/01/17 (!) 150/72  01/13/17 (!) 148/82  01/06/17 (!) 145/78   Wt Readings from Last 3 Encounters:  02/01/17 138 lb 12.8 oz (63 kg)  01/13/17 137 lb 6.4 oz (62.3 kg)  01/06/17 137 lb 9.6 oz (62.4 kg)    Physical Exam  Constitutional: She appears well-developed and well-nourished.  Eyes: Conjunctivae are normal.  Cardiovascular: Normal rate, regular rhythm, normal heart sounds and normal pulses.   Pulmonary/Chest: Effort normal and breath sounds normal. She has no wheezes. She has no rhonchi. She has no rales.  Neurological: She is alert.  Skin: Skin is warm and  dry.  Psychiatric: She has a normal mood and affect. Her speech is normal and behavior is normal. Thought content normal.  Vitals reviewed.      Assessment & Plan:   Problem List Items Addressed This Visit      Cardiovascular and Mediastinum   Hypertension - Primary    Increased. Not on any medication. Agreeable to trying amlodipine again. Dc'ed hctz. Will follow with pcp.       Relevant Medications   amLODipine (NORVASC) 2.5 MG tablet   atorvastatin (LIPITOR) 10 MG tablet     Other   HLD (hyperlipidemia)    Prefers lipitor. Dced pravastatin. Advised to slowly titrate to see if minimizes side effects. Will follow with pcp.       Relevant Medications   amLODipine (NORVASC) 2.5 MG tablet   atorvastatin (LIPITOR) 10 MG tablet       I have discontinued Kim Sanchez's hydrochlorothiazide and pravastatin. I am also having her start on amLODipine and atorvastatin. Additionally, I am having her maintain her inFLIXimab, aspirin EC, loratadine, Omega-3 Fatty Acids (FISH OIL PO), vitamin E, polyethylene glycol powder, cholecalciferol, LORazepam, and anastrozole.   Meds ordered this encounter  Medications  . amLODipine (NORVASC) 2.5 MG tablet    Sig: Take 1 tablet (2.5 mg total) by mouth daily.    Dispense:  90 tablet    Refill:  3    Order Specific Question:   Supervising Provider    Answer:   Deborra Medina L [[id number]]  . atorvastatin (LIPITOR) 10 MG tablet    Sig: Take 1 tablet (10 mg total) by mouth daily.    Dispense:  90 tablet    Refill:  3    Order Specific Question:   Supervising Provider    Answer:   [name] [[id number]]    Return precautions given.   Risks, benefits, and alternatives of the medications and treatment plan prescribed today were discussed, and patient expressed understanding.   Education regarding symptom management and diagnosis given to patient on AVS.  Continue to follow with [name], MD for routine health maintenance.   Kim Sanchez and I  agreed with plan.   Mable Paris, FNP

## 2017-02-01 NOTE — Assessment & Plan Note (Signed)
Prefers lipitor. Dced pravastatin. Advised to slowly titrate to see if minimizes side effects. Will follow with pcp.

## 2017-02-01 NOTE — Assessment & Plan Note (Signed)
Increased. Not on any medication. Agreeable to trying amlodipine again. Dc'ed hctz. Will follow with pcp.

## 2017-02-02 MED ORDER — ANASTROZOLE 1 MG PO TABS: 1.0000 mg | ORAL_TABLET | Freq: Every day | ORAL | 0 refills | Status: DC

## 2017-02-02 NOTE — Addendum Note (Signed)
Addended by: Betti Cruz on: 02/02/2017 03:56 PM   Modules accepted: Orders

## 2017-02-09 ENCOUNTER — Ambulatory Visit: Payer: Medicare Other | Admitting: Oncology

## 2017-02-10 DIAGNOSIS — I272 Pulmonary hypertension, unspecified: Secondary | ICD-10-CM | POA: Diagnosis not present

## 2017-02-10 DIAGNOSIS — R0789 Other chest pain: Secondary | ICD-10-CM | POA: Diagnosis not present

## 2017-02-10 DIAGNOSIS — I10 Essential (primary) hypertension: Secondary | ICD-10-CM | POA: Diagnosis not present

## 2017-03-02 ENCOUNTER — Ambulatory Visit
Admission: RE | Admit: 2017-03-02 | Discharge: 2017-03-02 | Disposition: A | Discharge status: Home / Self Care | Payer: Medicare Other | Source: Ambulatory Visit | Attending: Oncology | Admitting: Oncology

## 2017-03-02 DIAGNOSIS — Z780 Asymptomatic menopausal state: Secondary | ICD-10-CM | POA: Diagnosis not present

## 2017-03-02 DIAGNOSIS — N958 Other specified menopausal and perimenopausal disorders: Secondary | ICD-10-CM | POA: Insufficient documentation

## 2017-03-02 DIAGNOSIS — M8580 Other specified disorders of bone density and structure, unspecified site: Secondary | ICD-10-CM | POA: Insufficient documentation

## 2017-03-02 DIAGNOSIS — M85851 Other specified disorders of bone density and structure, right thigh: Secondary | ICD-10-CM | POA: Insufficient documentation

## 2017-03-02 DIAGNOSIS — Z853 Personal history of malignant neoplasm of breast: Secondary | ICD-10-CM | POA: Diagnosis not present

## 2017-03-02 DIAGNOSIS — M8589 Other specified disorders of bone density and structure, multiple sites: Secondary | ICD-10-CM | POA: Diagnosis not present

## 2017-03-02 DIAGNOSIS — Z8262 Family history of osteoporosis: Secondary | ICD-10-CM | POA: Diagnosis not present

## 2017-03-02 DIAGNOSIS — M8588 Other specified disorders of bone density and structure, other site: Secondary | ICD-10-CM | POA: Diagnosis not present

## 2017-03-02 DIAGNOSIS — K5080 Crohn's disease of both small and large intestine without complications: Secondary | ICD-10-CM | POA: Diagnosis not present

## 2017-03-02 NOTE — Progress Notes (Signed)
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  INTERVAL HISTORY: Patient returns to clinic today for routine six-month evaluation. She continues to tolerate anastrozole without significant side effects. She has no neurologic complaints.  She denies any fevers or recent illnesses.  She has a good appetite and denies weight loss.  She denies any chest pain or shortness of breath.  She has no nausea, vomiting, constipation, or diarrhea.  She has no urinary complaints.  Patient offers no specific complaints today.  REVIEW OF SYSTEMS:   Review of Systems  Constitutional: Negative.  Negative for fever, malaise/fatigue and weight loss.  Respiratory: Negative.  Negative for cough and shortness of breath.   Cardiovascular: Negative.  Negative for chest pain and leg swelling.  Gastrointestinal: Negative.  Negative for abdominal pain.  Genitourinary: Negative.   Musculoskeletal: Negative.   Skin: Negative.  Negative for rash.  Neurological: Negative.  Negative for sensory change and weakness.  Psychiatric/Behavioral: Negative.  The patient is not nervous/anxious and does not have insomnia.    As per HPI. Otherwise, a complete review of systems is negative.  PAST MEDICAL HISTORY: Past Medical History:  Diagnosis Date  . Arthritis   . Breast cancer, right breast ([place]) 08/24/2012   Histologic grade 1, invasive mammary carcinoma, no specific type. 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  . Crohn's disease ([place])   . GERD (gastroesophageal reflux disease)   . History of blood  transfusion 1960  . Hypertension   . Personal history of radiation therapy     PAST SURGICAL HISTORY: Past Surgical History:  Procedure Laterality Date  . BRAIN SURGERY  1980   breast biopsies, benign  . BREAST BIOPSY Right 2014   +  . BREAST BIOPSY Bilateral    neg  . BREAST LUMPECTOMY Right May 2014   T1c, N0; ER/PR positive, HER-2/neu not overexpressing.  Marland Kitchen BREAST SURGERY  1980's   fibro cyst both breast in [place]  . COLONOSCOPY  2017  . DILATION AND CURETTAGE OF UTERUS      FAMILY HISTORY Family History  Problem Relation Age of Onset  . Cancer Paternal Grandmother   . Heart disease Sister   . Diabetes Sister   . Breast cancer Neg Hx        ADVANCED DIRECTIVES:    HEALTH MAINTENANCE: Social History  Substance Use Topics  . Smoking status: Never Smoker  . Smokeless tobacco: Never Used  . Alcohol use 0.6 oz/week    1 Glasses of wine per week     Comment: OCC     Colonoscopy:  PAP:  Bone density:  Lipid panel:  Allergies  Allergen Reactions  . Letrozole Anaphylaxis and Itching  . Amlodipine Itching  . Exemestane Nausea Only  . Hydrochlorothiazide Other (See Comments)  . Lexapro [Escitalopram Oxalate] Other (See Comments)    intolerance  . Lisinopril Cough  . Sulfa Antibiotics Other (See Comments)    Current Outpatient Prescriptions  Medication Sig Dispense Refill  . amLODipine (NORVASC) 2.5 MG tablet Take 1 tablet (2.5 mg total) by mouth daily. 90 tablet 3  . anastrozole (ARIMIDEX) 1 MG tablet Take 1 tablet (1  mg total) by mouth daily. 90 tablet 0  . aspirin EC 81 MG tablet Take 81 mg by mouth daily.    Marland Kitchen atorvastatin (LIPITOR) 10 MG tablet Take 1 tablet (10 mg total) by mouth daily. 90 tablet 3  . cholecalciferol (VITAMIN D) 1000 units tablet Take 1,000 Units by mouth daily.    Marland Kitchen inFLIXimab (REMICADE) 100 MG injection Inject 1 mg into the vein every 8 (eight) weeks.    Marland Kitchen loratadine (CLARITIN) 10 MG tablet Take 1 tablet (10 mg total) by mouth  daily. 30 tablet 5  . LORazepam (ATIVAN) 0.5 MG tablet TAKE 1 TABLET BY MOUTH EVERY 8 HOURS AS NEEDED FOR ANXIETY 90 tablet 0  . Omega-3 Fatty Acids (FISH OIL PO) Take by mouth.    . polyethylene glycol powder (GLYCOLAX/MIRALAX) powder Take by mouth.    . vitamin E 400 UNIT capsule Take by mouth.     No current facility-administered medications for this visit.     OBJECTIVE: Vitals:   03/04/17 1442  BP: 136/77  Pulse: 76  Resp: 20  Temp: 97.6 F (36.4 C)     Body mass index is 25.64 kg/m.    ECOG FS:0 - Asymptomatic  General: Well-developed, well-nourished, no acute distress. Eyes: Pink conjunctiva, anicteric sclera. Breasts: Bilateral breast and axilla without lumps or masses. Lungs: Clear to auscultation bilaterally. Heart: Regular rate and rhythm. No rubs, murmurs, or gallops. Abdomen: Soft, nontender, nondistended. No organomegaly noted, normoactive bowel sounds. Musculoskeletal: No edema, cyanosis, or clubbing. Neuro: Alert, answering all questions appropriately. Cranial nerves grossly intact. Skin: No rashes or petechiae noted. Psych: Normal affect.   LAB RESULTS:  Lab Results  Component Value Date   NA 138 01/11/2017   K 3.8 01/11/2017   CL 103 01/11/2017   CO2 22 01/11/2017   GLUCOSE 117 (H) 01/11/2017   BUN 15 01/11/2017   CREATININE 0.76 01/11/2017   CALCIUM 9.4 01/11/2017   PROT 7.3 01/05/2017   ALBUMIN 4.3 01/05/2017   AST 16 01/05/2017   ALT 11 01/05/2017   ALKPHOS 74 01/05/2017   BILITOT 1.2 01/05/2017   GFRNONAA >60 12/31/2016   GFRAA >60 12/31/2016    Lab Results  Component Value Date   WBC 5.8 12/31/2016   NEUTROABS 1.9 09/30/2016   HGB 13.5 12/31/2016   HCT 39.7 12/31/2016   MCV 91.1 12/31/2016   PLT 215 12/31/2016     STUDIES: Dg Bone Density  Result Date: 03/02/2017 EXAM: DUAL X-RAY ABSORPTIOMETRY (DXA) FOR BONE MINERAL DENSITY IMPRESSION: Dear Dr Kim Sanchez, Your patient Kim Sanchez completed a BMD test on 03/02/2017 using the  [place] (analysis version: 14.10) manufactured by [employer]. The following summarizes the results of our evaluation. PATIENT BIOGRAPHICAL: Name: Kim, Sanchez I Patient ID: [id number] Birth Date: 1940-10-18 Height: 62.0 in. Gender: Female Exam Date: 03/02/2017 Weight: 138.8 lbs. Indications: Breast CA, Advanced Age, Postmenopausal, High Risk Meds, Family Hx of Osteoporosis, History of Breast Cancer, Caucasian, Parent Hip Fracture, Family Hist. (Parent hip fracture) Fractures: Treatments: letrozole, anastrozole, CALCIUM VIT D ASSESSMENT: The BMD measured at AP Spine L1-L4 is 0.983 g/cm2 with a T-score of -1.7. This patient is considered osteopenic according to [facility]) criteria. Site Region Measured Measured WHO Young Adult BMD Date       Age      Classification T-score AP Spine L1-L4 03/02/2017 75.8 Osteopenia -1.7 0.983 g/cm2 AP Spine L1-L4 01/27/2016 74.7 Osteopenia -1.5 1.009 g/cm2 AP Spine L1-L4 01/23/2014 72.7 Normal -1.0 1.070 g/cm2  AP Spine L1-L4 01/17/2013 71.7 Normal -0.6 1.120 g/cm2 DualFemur Neck Right 03/02/2017 75.8 Osteopenia -1.1 0.885 g/cm2 DualFemur Neck Right 01/27/2016 74.7 Osteopenia -1.2 0.876 g/cm2 DualFemur Neck Right 01/23/2014 72.7 Normal -0.8 0.922 g/cm2 DualFemur Neck Right 01/17/2013 71.7 Normal -1.0 0.905 g/cm2 World Health Organization [facility]) criteria for post-menopausal, Caucasian Women: Normal:       T-score at or above -1 SD Osteopenia:   T-score between -1 and -2.5 SD Osteoporosis: T-score at or below -2.5 SD RECOMMENDATIONS: [place] recommends that FDA-approved medical therapies be considered in postmenopausal women and men age 38 or older with a: 1. Hip or vertebral (clinical or morphometric) fracture. 2. T-score of < -2.5 at the spine or hip. 3. Ten-year fracture probability by FRAX of 3% or greater for hip fracture or 20% or greater for major osteoporotic fracture. All treatment decisions require clinical judgment and  consideration of individual patient factors, including patient preferences, co-morbidities, previous drug use, risk factors not captured in the FRAX model (e.g. falls, vitamin D deficiency, increased bone turnover, interval significant decline in bone density) and possible under - or over-estimation of fracture risk by FRAX. All patients should ensure an adequate intake of dietary calcium (1200 mg/d) and vitamin D (800 IU daily) unless contraindicated. FOLLOW-UP: People with diagnosed cases of osteoporosis or at high risk for fracture should have regular bone mineral density tests. For patients eligible for Medicare, routine testing is allowed once every 2 years. The testing frequency can be increased to one year for patients who have rapidly progressing disease, those who are receiving or discontinuing medical therapy to restore bone mass, or have additional risk factors. FRAX* RESULTS:  (version: 3.5) 10-year Probability of Fracture1 Major Osteoporotic Fracture2 Hip Fracture 16.8% 7.5% Population: [place] (Caucasian) Risk Factors: Family Hist. (Parent hip fracture) Based on Femur (Right) Neck BMD 1 -The 10-year probability of fracture may be lower than reported if the patient has received treatment. 2 -Major Osteoporotic Fracture: Clinical Spine, Forearm, Hip or Shoulder *FRAX is a Materials engineer of the [employer] for Metabolic Bone Disease, a [place] (WHO) [facility]. ASSESSMENT: The probability of a major osteoporotic fracture is 16.8% within the next ten years. The probability of a hip fracture is 7.5% within the next ten years. Electronically Signed   By: Earle Gell M.D.   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 No Oncotype score available.  PLAN:    1.  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  Continue with anastrozole, completing 5 years of treatment in September of 2019.  Patient's most recent mammogram on Oct 01, 2016 was reported as BI-RADS 2.  Repeat in May 2019.  Biopsy of her right breast on April 11, 2015 was negative for malignancy. Return to clinic in 6 months for routine evaluation. 2. Osteopenia: Bone mineral density on March 02, 2017 with a T score of -1.7 which is slightly worse than one year prior when her T score was reported at -1.5. Repeat bone mineral density in October 2019. Continue calcium and vitamin D supplementation.  3. Arthritis: Continue Remicade as prescribed by her rheumatologist.   Patient expressed understanding and was in agreement with this plan. She also understands that She can call clinic at any time with any questions, concerns, or complaints.    Lloyd Huger,  MD   03/05/2017 9:26 AM

## 2017-03-04 ENCOUNTER — Inpatient Hospital Stay: Payer: Medicare Other | Attending: Oncology | Admitting: Oncology

## 2017-03-04 ENCOUNTER — Ambulatory Visit (INDEPENDENT_AMBULATORY_CARE_PROVIDER_SITE_OTHER): Payer: Medicare Other | Admitting: Family

## 2017-03-04 ENCOUNTER — Encounter: Payer: Self-pay | Admitting: Family

## 2017-03-04 VITALS — BP 136/77 | HR 76 | Temp 97.6°F | Resp 20 | Wt 140.2 lb

## 2017-03-04 DIAGNOSIS — Z923 Personal history of irradiation: Secondary | ICD-10-CM

## 2017-03-04 DIAGNOSIS — Z79899 Other long term (current) drug therapy: Secondary | ICD-10-CM | POA: Insufficient documentation

## 2017-03-04 DIAGNOSIS — I10 Essential (primary) hypertension: Secondary | ICD-10-CM | POA: Diagnosis not present

## 2017-03-04 DIAGNOSIS — Z79811 Long term (current) use of aromatase inhibitors: Secondary | ICD-10-CM | POA: Insufficient documentation

## 2017-03-04 DIAGNOSIS — Z170 Estrogen receptor positive status [ER+]: Secondary | ICD-10-CM | POA: Diagnosis not present

## 2017-03-04 DIAGNOSIS — C50911 Malignant neoplasm of unspecified site of right female breast: Secondary | ICD-10-CM | POA: Insufficient documentation

## 2017-03-04 DIAGNOSIS — M8580 Other specified disorders of bone density and structure, unspecified site: Secondary | ICD-10-CM | POA: Insufficient documentation

## 2017-03-04 DIAGNOSIS — K5090 Crohn's disease, unspecified, without complications: Secondary | ICD-10-CM | POA: Diagnosis not present

## 2017-03-04 DIAGNOSIS — Z7982 Long term (current) use of aspirin: Secondary | ICD-10-CM | POA: Diagnosis not present

## 2017-03-04 DIAGNOSIS — E7800 Pure hypercholesterolemia, unspecified: Secondary | ICD-10-CM

## 2017-03-04 DIAGNOSIS — K219 Gastro-esophageal reflux disease without esophagitis: Secondary | ICD-10-CM | POA: Diagnosis not present

## 2017-03-04 DIAGNOSIS — M129 Arthropathy, unspecified: Secondary | ICD-10-CM | POA: Diagnosis not present

## 2017-03-04 NOTE — Progress Notes (Signed)
Subjective:    Patient ID: Kim Sanchez, female    DOB: 1940/10/30, 76 y.o.   MRN: [id number]  CC: Kim Sanchez is a 76 y.o. female who presents today for follow up.   HPI: HTN started back on amlodipine 62m. 140s/60s at home. Denies exertional chest pain or pressure, numbness or tingling radiating to left arm or jaw, palpitations, dizziness, frequent headaches, changes in vision, or shortness of breath.  Recently seen Dr. FUbaldo Glassingadvised 140/90 as goal. Patient feels comfortable with this range.   HLD- taking statin every other day, tolerating well.       HISTORY:  Past Medical History:  Diagnosis Date  . Arthritis   . [place]   Histologic grade 1, invasive mammary carcinoma, no specific type. 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  . [place]   . GERD (gastroesophageal reflux disease)   . History of blood transfusion 1960  . Hypertension   . Personal history of radiation therapy    Past Surgical History:  Procedure Laterality Date  . BRAIN SURGERY  1980   breast biopsies, benign  . BREAST BIOPSY Right 2014   +  . BREAST BIOPSY Bilateral    neg  . BREAST LUMPECTOMY Right May 2014   T1c, N0; ER/PR positive, HER-2/neu not overexpressing.  .Marland KitchenBREAST SURGERY  1980's   [place]  [place]  . DILATION AND CURETTAGE OF UTERUS     Family History  Problem Relation Age of Onset  . Cancer Paternal Grandmother   . Heart disease Sister   . Diabetes Sister   . Breast cancer Neg Hx     Allergies: Letrozole; Amlodipine; Exemestane; Hydrochlorothiazide; Lexapro [escitalopram oxalate]; Lisinopril; and Sulfa antibiotics Current Outpatient Prescriptions on File Prior to Visit  Medication Sig Dispense Refill  . amLODipine (NORVASC) 2.5 MG tablet Take 1 tablet (2.5 mg total) by mouth daily. 90 tablet 3  . anastrozole (ARIMIDEX) 1 MG tablet Take 1  tablet (1 mg total) by mouth daily. 90 tablet 0  . aspirin EC 81 MG tablet Take 81 mg by mouth daily.    .Marland Kitchenatorvastatin (LIPITOR) 10 MG tablet Take 1 tablet (10 mg total) by mouth daily. 90 tablet 3  . cholecalciferol (VITAMIN D) 1000 units tablet Take 1,000 Units by mouth daily.    .Marland KitcheninFLIXimab (REMICADE) 100 MG injection Inject 1 mg into the vein every 8 (eight) weeks.    .Marland Kitchenloratadine (CLARITIN) 10 MG tablet Take 1 tablet (10 mg total) by mouth daily. 30 tablet 5  . LORazepam (ATIVAN) 0.5 MG tablet TAKE 1 TABLET BY MOUTH EVERY 8 HOURS AS NEEDED FOR ANXIETY 90 tablet 0  . Omega-3 Fatty Acids (FISH OIL PO) Take by mouth.    . polyethylene glycol powder (GLYCOLAX/MIRALAX) powder Take by mouth.    . vitamin E 400 UNIT capsule Take by mouth.    . [DISCONTINUED] losartan-hydrochlorothiazide (HYZAAR) 50-12.5 MG tablet Take 1 tablet by mouth daily. 90 tablet 0   No current facility-administered medications on file prior to visit.     Social History  Substance Use Topics  . Smoking status: Never Smoker  . Smokeless tobacco: Never Used  . Alcohol use 0.6 oz/week    1 Glasses of wine per week     Comment: OCC    Review of Systems  Constitutional: Negative for chills and fever.  Respiratory: Negative  for cough.   Cardiovascular: Negative for chest pain and palpitations.  Gastrointestinal: Negative for nausea and vomiting.      Objective:    BP (!) 135/50   Pulse 73   Temp 98.1 F (36.7 C) (Oral)   Ht 5' 2"  (1.575 m)   Wt 139 lb 3.2 oz (63.1 kg)   SpO2 96%   BMI 25.46 kg/m  BP Readings from Last 3 Encounters:  03/04/17 (!) 135/50  02/01/17 (!) 150/72  01/13/17 (!) 148/82   Wt Readings from Last 3 Encounters:  03/04/17 139 lb 3.2 oz (63.1 kg)  02/01/17 138 lb 12.8 oz (63 kg)  01/13/17 137 lb 6.4 oz (62.3 kg)    Physical Exam  Constitutional: She appears well-developed and well-nourished.  Eyes: Conjunctivae are normal.  Cardiovascular: Normal rate, regular rhythm, normal  heart sounds and normal pulses.   Pulmonary/Chest: Effort normal and breath sounds normal. She has no wheezes. She has no rhonchi. She has no rales.  Neurological: She is alert.  Skin: Skin is warm and dry.  Psychiatric: She has a normal mood and affect. Her speech is normal and behavior is normal. Thought content normal.  Vitals reviewed.      Assessment & Plan:   Problem List Items Addressed This Visit      Cardiovascular and Mediastinum   Hypertension    Improved and at goal today. Blood pressure appears labile. Advised to monitor and may have to move to 2.29m amlodipine if 542mtoo potent. Patient verbalized understanding and had good knowledge of management thereof and BP goal. Will follow with pcp        Other   HLD (hyperlipidemia)    Tolerating lipitor QOD. Will follow with pcp          I am having Kim Sanchez her inFLIXimab, aspirin EC, loratadine, Omega-3 Fatty Acids (FISH OIL PO), vitamin E, polyethylene glycol powder, cholecalciferol, LORazepam, amLODipine, atorvastatin, and anastrozole.   No orders of the defined types were placed in this encounter.   Return precautions given.   Risks, benefits, and alternatives of the medications and treatment plan prescribed today were discussed, and patient expressed understanding.   Education regarding symptom management and diagnosis given to patient on AVS.  Continue to follow with TuCrecencio McMD for routine health maintenance.   AgDarrol Jumpnd I agreed with plan.   MaMable ParisFNP

## 2017-03-04 NOTE — Progress Notes (Signed)
Patient denies any concerns today.  

## 2017-03-04 NOTE — Patient Instructions (Signed)
Blood pressure improved- please continue to monitor. Appears a bit labile so you may have 'dance' between 2.5 mg amlodipine and 68m amlodipine as we discussed  Take care!   Managing Your Hypertension Hypertension is commonly called high blood pressure. This is when the force of your blood pressing against the walls of your arteries is too strong. Arteries are blood vessels that carry blood from your heart throughout your body. Hypertension forces the heart to work harder to pump blood, and may cause the arteries to become narrow or stiff. Having untreated or uncontrolled hypertension can cause heart attack, stroke, kidney disease, and other problems. What are blood pressure readings? A blood pressure reading consists of a higher number over a lower number. Ideally, your blood pressure should be below 120/80. The first ("top") number is called the systolic pressure. It is a measure of the pressure in your arteries as your heart beats. The second ("bottom") number is called the diastolic pressure. It is a measure of the pressure in your arteries as the heart relaxes. What does my blood pressure reading mean? Blood pressure is classified into four stages. Based on your blood pressure reading, your health care provider may use the following stages to determine what type of treatment you need, if any. Systolic pressure and diastolic pressure are measured in a unit called mm Hg. Normal  Systolic pressure: below 1335  Diastolic pressure: below 80. Elevated  Systolic pressure: 1456-256  Diastolic pressure: below 80. Hypertension stage 1  Systolic pressure: 1389-373  Diastolic pressure: 842-87 Hypertension stage 2  Systolic pressure: 1681or above.  Diastolic pressure: 90 or above. What health risks are associated with hypertension? Managing your hypertension is an important responsibility. Uncontrolled hypertension can lead to:  A heart attack.  A stroke.  A weakened blood vessel  (aneurysm).  Heart failure.  Kidney damage.  Eye damage.  Metabolic syndrome.  Memory and concentration problems.  What changes can I make to manage my hypertension? Hypertension can be managed by making lifestyle changes and possibly by taking medicines. Your health care provider will help you make a plan to bring your blood pressure within a normal range. Eating and drinking  Eat a diet that is high in fiber and potassium, and low in salt (sodium), added sugar, and fat. An example eating plan is called the DASH (Dietary Approaches to Stop Hypertension) diet. To eat this way: ? Eat plenty of fresh fruits and vegetables. Try to fill half of your plate at each meal with fruits and vegetables. ? Eat whole grains, such as whole wheat pasta, brown rice, or whole grain bread. Fill about one quarter of your plate with whole grains. ? Eat low-fat diary products. ? Avoid fatty cuts of meat, processed or cured meats, and poultry with skin. Fill about one quarter of your plate with lean proteins such as fish, chicken without skin, beans, eggs, and tofu. ? Avoid premade and processed foods. These tend to be higher in sodium, added sugar, and fat.  Reduce your daily sodium intake. Most people with hypertension should eat less than 1,500 mg of sodium a day.  Limit alcohol intake to no more than 1 drink a day for nonpregnant women and 2 drinks a day for men. One drink equals 12 oz of beer, 5 oz of wine, or 1 oz of hard liquor. Lifestyle  Work with your health care provider to maintain a healthy body weight, or to lose weight. Ask what an ideal weight is for you.  Get at least 30 minutes of exercise that causes your heart to beat faster (aerobic exercise) most days of the week. Activities may include walking, swimming, or biking.  Include exercise to strengthen your muscles (resistance exercise), such as weight lifting, as part of your weekly exercise routine. Try to do these types of exercises for  30 minutes at least 3 days a week.  Do not use any products that contain nicotine or tobacco, such as cigarettes and e-cigarettes. If you need help quitting, ask your health care provider.  Control any long-term (chronic) conditions you have, such as high cholesterol or diabetes. Monitoring  Monitor your blood pressure at home as told by your health care provider. Your personal target blood pressure may vary depending on your medical conditions, your age, and other factors.  Have your blood pressure checked regularly, as often as told by your health care provider. Working with your health care provider  Review all the medicines you take with your health care provider because there may be side effects or interactions.  Talk with your health care provider about your diet, exercise habits, and other lifestyle factors that may be contributing to hypertension.  Visit your health care provider regularly. Your health care provider can help you create and adjust your plan for managing hypertension. Will I need medicine to control my blood pressure? Your health care provider may prescribe medicine if lifestyle changes are not enough to get your blood pressure under control, and if:  Your systolic blood pressure is 130 or higher.  Your diastolic blood pressure is 80 or higher.  Take medicines only as told by your health care provider. Follow the directions carefully. Blood pressure medicines must be taken as prescribed. The medicine does not work as well when you skip doses. Skipping doses also puts you at risk for problems. Contact a health care provider if:  You think you are having a reaction to medicines you have taken.  You have repeated (recurrent) headaches.  You feel dizzy.  You have swelling in your ankles.  You have trouble with your vision. Get help right away if:  You develop a severe headache or confusion.  You have unusual weakness or numbness, or you feel faint.  You  have severe pain in your chest or abdomen.  You vomit repeatedly.  You have trouble breathing. Summary  Hypertension is when the force of blood pumping through your arteries is too strong. If this condition is not controlled, it may put you at risk for serious complications.  Your personal target blood pressure may vary depending on your medical conditions, your age, and other factors. For most people, a normal blood pressure is less than 120/80.  Hypertension is managed by lifestyle changes, medicines, or both. Lifestyle changes include weight loss, eating a healthy, low-sodium diet, exercising more, and limiting alcohol. This information is not intended to replace advice given to you by your health care provider. Make sure you discuss any questions you have with your health care provider. Document Released: 01/13/2012 Document Revised: 03/18/2016 Document Reviewed: 03/18/2016 Elsevier Interactive Patient Education  [employer].

## 2017-03-04 NOTE — Progress Notes (Signed)
Pre visit review using our clinic review tool, if applicable. No additional management support is needed unless otherwise documented below in the visit note. 

## 2017-03-04 NOTE — Assessment & Plan Note (Signed)
Improved and at goal today. Blood pressure appears labile. Advised to monitor and may have to move to 2.72m amlodipine if 576mtoo potent. Patient verbalized understanding and had good knowledge of management thereof and BP goal. Will follow with pcp

## 2017-03-04 NOTE — Assessment & Plan Note (Signed)
Tolerating lipitor QOD. Will follow with pcp

## 2017-03-09 DIAGNOSIS — K5080 Crohn's disease of both small and large intestine without complications: Secondary | ICD-10-CM | POA: Diagnosis not present

## 2017-03-10 DIAGNOSIS — K5080 Crohn's disease of both small and large intestine without complications: Secondary | ICD-10-CM | POA: Diagnosis not present

## 2017-03-22 ENCOUNTER — Ambulatory Visit: Payer: Self-pay | Admitting: *Deleted

## 2017-03-22 ENCOUNTER — Telehealth: Payer: Self-pay | Admitting: Internal Medicine

## 2017-03-22 NOTE — Telephone Encounter (Signed)
Patient is aware. Advised to call us back if symptoms did not resolve

## 2017-03-22 NOTE — Telephone Encounter (Signed)
Copied from [place] [id number]. Topic: Quick Communication - See Telephone Encounter >> Mar 22, 2017 10:57 AM [place], Laban Emperor wrote: CRM for notification. See Telephone encounter for: Pt is requesting a med refill on her amlodopine.   [facility] [address] [place] [phone] (Phone) [phone] (Fax)    03/22/17.

## 2017-03-22 NOTE — Telephone Encounter (Signed)
Patient is requesting a z-pak. You already have a 4:30 and NP is booked. Should I advise appt at another office or urgent care?

## 2017-03-22 NOTE — Telephone Encounter (Signed)
  I do not recommend use of a Z Pak  for the symptoms she has  described because she is describing a viral infection., and Z pack is an antibiotic. Viral infections  will usually resolve with supportive care (and time) unless she does not treat the congestion aggressively enough .  I would avoid sudafed and sudafed PE because they may elevate her blood pressure.   But she can use Afrin nasal spray or coricidin BP. You have a viral  Syndrome .  The post nasal drip is causing your sore throat and cough .  Flush your sinuses once or  twice daily with  NeilMed's sinus rinse.  Use benadryl 25 mg at bedtime  for the post nasal drip and Afrin nasal spray every 12 hours as needed for the congestion.  Gargle with salt water as needed for the sore throat. Delsym for the cough

## 2017-03-22 NOTE — Telephone Encounter (Signed)
  Reason for Disposition . [1] Patient also has allergy symptoms (e.g., itchy eyes, clear nasal discharge, postnasal drip) AND [2] they are acting up  Answer Assessment - Initial Assessment Questions 1. ONSET: "When did the cough begin?"     Saturday evening 2. SEVERITY: "How bad is the cough today?"      No 3. RESPIRATORY DISTRESS: "Describe your breathing."      Normal, no difficulty breathing 4. FEVER: "Do you have a fever?" If so, ask: "What is your temperature, how was it measured, and when did it start?"     No 5. HEMOPTYSIS: "Are you coughing up any blood?" If so ask: "How much?" (flecks, streaks, tablespoons, etc.)     No 6. TREATMENT: "What have you done so far to treat the cough?" (e.g., meds, fluids, humidifier)     Claritin, Tylenol 7. CARDIAC HISTORY: "Do you have any history of heart disease?" (e.g., heart attack, congestive heart failure)      No 8. LUNG HISTORY: "Do you have any history of lung disease?"  (e.g., pulmonary embolus, asthma, emphysema)     No 9. PE RISK FACTORS: "Do you have a history of blood clots?" (or: recent major surgery, recent prolonged travel, bedridden )     No 10. OTHER SYMPTOMS: "Do you have any other symptoms? (e.g., runny nose, wheezing, chest pain)       Wheezing,runny nose 11. PREGNANCY: "Is there any chance you are pregnant?" "When was your last menstrual period?"       No 12. TRAVEL: "Have you traveled out of the country in the last month?" (e.g., travel history, exposures)       No  Protocols used: COUGH - ACUTE NON-PRODUCTIVE-A-AH  Pt has complaints of congestion, cough, nasal drainage that she feels is going in to her chest and throat irritation. Pt states she has these symptoms every year and has been treated previously with a Z-pak. Pt is wanting to know if a Z-pak could be called in, instead of coming in for an office visit or going to Urgent Care.

## 2017-03-22 NOTE — Telephone Encounter (Signed)
Pt  Refilled  On  02/01/2017    For   90  Tabs   With  Three  Refills     Too  early

## 2017-03-28 ENCOUNTER — Encounter: Payer: Self-pay | Admitting: Internal Medicine

## 2017-03-29 ENCOUNTER — Other Ambulatory Visit: Payer: Self-pay | Admitting: Internal Medicine

## 2017-03-29 DIAGNOSIS — I10 Essential (primary) hypertension: Secondary | ICD-10-CM

## 2017-03-29 MED ORDER — AMLODIPINE BESYLATE 5 MG PO TABS: 5.0000 mg | ORAL_TABLET | Freq: Every day | ORAL | 0 refills | Status: DC

## 2017-04-06 ENCOUNTER — Other Ambulatory Visit: Payer: Self-pay | Admitting: Internal Medicine

## 2017-04-08 ENCOUNTER — Other Ambulatory Visit: Payer: Self-pay | Admitting: *Deleted

## 2017-04-14 ENCOUNTER — Ambulatory Visit (INDEPENDENT_AMBULATORY_CARE_PROVIDER_SITE_OTHER): Payer: Medicare Other

## 2017-04-14 ENCOUNTER — Ambulatory Visit: Payer: Medicare Other

## 2017-04-14 VITALS — BP 132/70 | HR 67 | Temp 98.4°F | Resp 14 | Ht 61.5 in | Wt 140.4 lb

## 2017-04-14 DIAGNOSIS — Z0000 Encounter for general adult medical examination without abnormal findings: Secondary | ICD-10-CM | POA: Diagnosis not present

## 2017-04-14 DIAGNOSIS — E119 Type 2 diabetes mellitus without complications: Secondary | ICD-10-CM

## 2017-04-14 NOTE — Progress Notes (Signed)
Subjective:   Kim Sanchez is a 76 y.o. female who presents for Medicare Annual (Subsequent) preventive examination.  Review of Systems:  No ROS.  Medicare Wellness Visit. Additional risk factors are reflected in the social history.  Cardiac Risk Factors include: advanced age (>80mn, >>68women);hypertension;diabetes mellitus     Objective:     Vitals: BP 132/70 (BP Location: Left Arm, Patient Position: Sitting, Cuff Size: Normal)   Pulse 67   Temp 98.4 F (36.9 C) (Oral)   Resp 14   Ht 5' 1.5" (1.562 m)   Wt 140 lb 6.4 oz (63.7 kg)   SpO2 97%   BMI 26.10 kg/m   Body mass index is 26.1 kg/m.  Advanced Directives 04/14/2017 03/04/2017 12/31/2016 08/10/2016 04/14/2016 02/03/2016 07/23/2015  Does Patient Have a Medical Advance Directive? Yes Yes Yes Yes Yes Yes Yes  Type of AParamedicof AHarrahLiving will - HFosterLiving will HAnitaLiving will HSunriseLiving will HRosebudLiving will HHarrisLiving will  Does patient want to make changes to medical advance directive? No - Patient declined - - No - Patient declined No - Patient declined - No - Patient declined  Copy of HPierzin Chart? No - copy requested - - No - copy requested No - copy requested No - copy requested Yes  Would patient like information on creating a medical advance directive? - - - - - - No - patient declined information    Tobacco Social History   Tobacco Use  Smoking Status Never Smoker  Smokeless Tobacco Never Used     Counseling given: Not Answered   Clinical Intake:  Pre-visit preparation completed: Yes  Pain : No/denies pain     Nutritional Status: BMI 25 -29 Overweight Diabetes: Yes(Followed by PCP)  Activities of Daily Living: Independent Ambulation: Independent Medication Administration: Independent Home Management: Independent      Do you feel unsafe in your current relationship?: No Do you feel physically threatened by others?: No Anyone hurting you at home, work, or school?: No Unable to ask?: No  How often do you need to have someone help you when you read instructions, pamphlets, or other written materials from your doctor or pharmacy?: 1 - Never  Interpreter Needed?: No     Past Medical History:  Diagnosis Date  . Arthritis   . 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  . 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 GERD (gastroesophageal reflux disease)   . History of blood transfusion 1960  . Hypertension   . Personal history of radiation therapy    Past Surgical History:  Procedure Laterality Date  . BRAIN SURGERY  1980   breast biopsies, benign  . BREAST BIOPSY Right 2014   +  . BREAST BIOPSY Bilateral    neg  . BREAST LUMPECTOMY Right May 2014   T1c, N0; ER/PR positive, HER-2/neu not overexpressing.  .Marland KitchenBREAST SURGERY  1980's   [place]  2017  . DILATION AND CURETTAGE OF UTERUS     Family History  Problem Relation Age of Onset  . Cancer Paternal Grandmother   . Heart disease Sister   . Diabetes Sister   . Breast cancer Neg Hx    Social History  Socioeconomic History  . Marital status: Married    Spouse name: None  . Number of children: None  . Years of education: None  . Highest education level: None  Social Needs  . Financial resource strain: None  . Food insecurity - worry: None  . Food insecurity - inability: None  . Transportation needs - medical: None  . Transportation needs - non-medical: None  Occupational History  . None  Tobacco Use  . Smoking status: Never Smoker  . Smokeless tobacco: Never Used  Substance and Sexual Activity  . Alcohol use: Yes    Alcohol/week: 0.6 oz     Types: 1 Glasses of wine per week    Comment: OCC  . Drug use: No  . Sexual activity: Not Currently  Other Topics Concern  . None  Social History Narrative  . None    Outpatient Encounter Medications as of 04/14/2017  Medication Sig  . amLODipine (NORVASC) 5 MG tablet Take 1 tablet (5 mg total) by mouth daily. (Patient taking differently: Take 5 mg by mouth 2 (two) times daily. )  . anastrozole (ARIMIDEX) 1 MG tablet Take 1 tablet (1 mg total) by mouth daily.  Marland Kitchen aspirin EC 81 MG tablet Take 81 mg by mouth daily.  Marland Kitchen atorvastatin (LIPITOR) 10 MG tablet Take 1 tablet (10 mg total) by mouth daily. (Patient taking differently: Take 10 mg by mouth every other day. )  . cholecalciferol (VITAMIN D) 1000 units tablet Take 1,000 Units by mouth daily.  Marland Kitchen inFLIXimab (REMICADE) 100 MG injection Inject 1 mg into the vein every 8 (eight) weeks.  Marland Kitchen loratadine (CLARITIN) 10 MG tablet Take 1 tablet (10 mg total) by mouth daily. (Patient taking differently: Take 10 mg by mouth daily as needed. )  . LORazepam (ATIVAN) 0.5 MG tablet TAKE 1 TABLET BY MOUTH EVERY 8 HOURS AS NEEDED FOR ANXIETY  . Omega-3 Fatty Acids (FISH OIL PO) Take by mouth.  . polyethylene glycol powder (GLYCOLAX/MIRALAX) powder Take by mouth.  . vitamin E 400 UNIT capsule Take by mouth.   No facility-administered encounter medications on file as of 04/14/2017.     Activities of Daily Living In your present state of health, do you have any difficulty performing the following activities: 04/14/2017 04/14/2016  Hearing? Y Y  Comment HOH L ear -  Vision? N N  Difficulty concentrating or making decisions? N N  Walking or climbing stairs? N N  Dressing or bathing? N N  Doing errands, shopping? N N  Preparing Food and eating ? N N  Using the Toilet? N N  In the past six months, have you accidently leaked urine? N N  Do you have problems with loss of bowel control? N N  Managing your Medications? N N  Managing your Finances? N N   Housekeeping or managing your Housekeeping? N N  Some recent data might be hidden    Patient Care Team: [name], MD as PCP - General (Internal Medicine) Bary Castilla, Forest Gleason, MD (General Surgery) [name], MD (Internal Medicine)    Assessment:    This is a routine wellness examination for Eek. The goal of the wellness visit is to assist the patient how to close the gaps in care and create a preventative care plan for the patient.   The roster of all physicians providing medical care to patient is listed in the Snapshot section of the chart.  Taking calcium VIT D as appropriate/Osteoporosis risk reviewed.  Safety issues reviewed; Primary caretaker for husband. Smoke and carbon monoxide detectors in the home. No firearms in the home.  Wears seatbelts when driving or riding with others. Patient does wear sunscreen or protective clothing when in direct sunlight. No violence in the home.  Depression- PHQ 2 &9 complete.  No signs/symptoms or verbal communication regarding little pleasure in doing things, feeling down, depressed or hopeless. No changes in sleeping, energy, eating, concentrating.  No thoughts of self harm or harm towards others.  Time spent on this topic is 8 minutes.   Patient is alert, normal appearance, oriented to person/place/and time.  Correctly identified the president of the [place], recall of 3/3 words, and performing simple calculations. Displays appropriate judgement and can read correct time from watch face.   No new identified risk were noted.  No failures at ADL's or IADL's.   BMI- discussed the importance of a healthy diet, water intake and the benefits of aerobic exercise. Educational material provided.   24 hour diet recall: Low carb foods  Daily fluid intake: 4 cups of caffeine, 4 cups of water  Dental- every 6 months.  Eye- Visual acuity not assessed per patient preference since they have regular follow up with the ophthalmologist.   Wears corrective lenses.  Sleep patterns- Sleeps 5-7 hours at night.  Wakes feeling tired.  She is considering adding a low dose of melatonin 30 minutes prior to sleep.   Patient Concerns: Requests fasting labs; ordered.  Follow up appointment previously scheduled with PCP this Friday.   Exercise Activities and Dietary recommendations Current Exercise Habits: Home exercise routine, Type of exercise: walking  Goals    . Increase physical activity     Walk for exercise      Fall Risk Fall Risk  04/14/2017 01/06/2017 04/14/2016 02/06/2016 01/15/2016  Falls in the past year? No No No Yes Yes  Number falls in past yr: - - - 1 1  Injury with Fall? - - - No No   Depression Screen PHQ 2/9 Scores 04/14/2017 01/06/2017 04/14/2016 02/06/2016  PHQ - 2 Score 0 0 0 0     Cognitive Function MMSE - Mini Mental State Exam 04/14/2017 04/14/2016  Orientation to time 5 5  Orientation to Place 5 5  Registration 3 3  Attention/ Calculation 5 5  Recall 3 3  Language- name 2 objects 2 2  Language- repeat 1 1  Language- follow 3 step command 3 3  Language- read & follow direction 1 1  Write a sentence 1 1  Copy design 1 1  Total score 30 30        Immunization History  Administered Date(s) Administered  . Influenza Split 02/01/2013  . Influenza Whole 01/17/2012  . Influenza, High Dose Seasonal PF 02/02/2016, 01/13/2017  . Influenza,inj,Quad PF,6+ Mos 03/20/2015  . Influenza-Unspecified 03/05/2014  . Pneumococcal Conjugate-13 07/24/2014  . Pneumococcal Polysaccharide-23 12/16/2012  . Td 03/19/2006  . Tdap 07/25/2015   Screening Tests Health Maintenance  Topic Date Due  . FOOT EXAM  04/01/2017  . HEMOGLOBIN A1C  07/05/2017  . OPHTHALMOLOGY EXAM  09/01/2017  . URINE MICROALBUMIN  01/05/2018  . TETANUS/TDAP  07/24/2025  . COLONOSCOPY  10/07/2025  . INFLUENZA VACCINE  Completed  . DEXA SCAN  Completed  . PNA vac Low Risk Adult  Completed      Plan:    End of life planning; Advance  aging; Advanced directives discussed. Copy of current HCPOA/Living Will requested.    I have personally  reviewed and noted the following in the patient's chart:   . Medical and social history . Use of alcohol, tobacco or illicit drugs  . Current medications and supplements . Functional ability and status . Nutritional status . Physical activity . Advanced directives . List of other physicians . Hospitalizations, surgeries, and ER visits in previous 12 months . Vitals . Screenings to include cognitive, depression, and falls . Referrals and appointments  In addition, I have reviewed and discussed with patient certain preventive protocols, quality metrics, and best practice recommendations. A written personalized care plan for preventive services as well as general preventive health recommendations were provided to patient.     OBrien-Blaney, Denisa L, LPN  [id number]   I have reviewed the above information and agree with above.   Deborra Medina, MD

## 2017-04-14 NOTE — Patient Instructions (Addendum)
  Kim Sanchez , Thank you for taking time to come for your Medicare Wellness Visit. I appreciate your ongoing commitment to your health goals. Please review the following plan we discussed and let me know if I can assist you in the future.   Follow up with Dr. Derrel Nip as needed.   Bring a copy of your [place] and/or Living Will to be scanned into chart.  Have a great day!  These are the goals we discussed: Goals    . Increase physical activity     Walk for exercise       This is a list of the screening recommended for you and due dates:  Health Maintenance  Topic Date Due  . Complete foot exam   04/01/2017  . Hemoglobin A1C  07/05/2017  . Eye exam for diabetics  09/01/2017  . Urine Protein Check  01/05/2018  . Tetanus Vaccine  07/24/2025  . Colon Cancer Screening  10/07/2025  . Flu Shot  Completed  . DEXA scan (bone density measurement)  Completed  . Pneumonia vaccines  Completed

## 2017-04-15 ENCOUNTER — Other Ambulatory Visit (INDEPENDENT_AMBULATORY_CARE_PROVIDER_SITE_OTHER): Payer: Medicare Other

## 2017-04-15 DIAGNOSIS — E119 Type 2 diabetes mellitus without complications: Secondary | ICD-10-CM | POA: Diagnosis not present

## 2017-04-15 LAB — COMPREHENSIVE METABOLIC PANEL
ALBUMIN: 4.2 g/dL (ref 3.5–5.2)
ALK PHOS: 71 U/L (ref 39–117)
ALT: 14 U/L (ref 0–35)
AST: 16 U/L (ref 0–37)
BUN: 11 mg/dL (ref 6–23)
CHLORIDE: 104 meq/L (ref 96–112)
CO2: 31 mEq/L (ref 19–32)
CREATININE: 0.69 mg/dL (ref 0.40–1.20)
Calcium: 9 mg/dL (ref 8.4–10.5)
GFR: 87.92 mL/min (ref >60.00)
GLUCOSE: 121 mg/dL — AB (ref 70–99)
Potassium: 3.8 mEq/L (ref 3.5–5.1)
SODIUM: 141 meq/L (ref 135–145)
TOTAL PROTEIN: 7.5 g/dL (ref 6.0–8.3)
Total Bilirubin: 0.9 mg/dL (ref 0.2–1.2)

## 2017-04-15 LAB — LIPID PANEL
CHOL/HDL RATIO: 2
Cholesterol: 179 mg/dL (ref 0–200)
HDL: 84 mg/dL (ref >39.00)
LDL CALC: 80 mg/dL (ref 0–99)
NonHDL: 95.41
TRIGLYCERIDES: 79 mg/dL (ref 0.0–149.0)
VLDL: 15.8 mg/dL (ref 0.0–40.0)

## 2017-04-15 LAB — MICROALBUMIN / CREATININE URINE RATIO
Creatinine,U: 134.8 mg/dL
MICROALB UR: 1.2 mg/dL (ref 0.0–1.9)
Microalb Creat Ratio: 0.9 mg/g (ref 0.0–30.0)

## 2017-04-15 LAB — HEMOGLOBIN A1C: HEMOGLOBIN A1C: 6.7 % — AB (ref 4.6–6.5)

## 2017-04-16 ENCOUNTER — Ambulatory Visit (INDEPENDENT_AMBULATORY_CARE_PROVIDER_SITE_OTHER): Payer: Medicare Other | Admitting: Internal Medicine

## 2017-04-16 ENCOUNTER — Encounter: Payer: Self-pay | Admitting: Internal Medicine

## 2017-04-16 VITALS — BP 144/88 | HR 79 | Temp 98.1°F | Resp 15 | Ht 61.5 in | Wt 139.6 lb

## 2017-04-16 DIAGNOSIS — E7800 Pure hypercholesterolemia, unspecified: Secondary | ICD-10-CM

## 2017-04-16 DIAGNOSIS — E119 Type 2 diabetes mellitus without complications: Secondary | ICD-10-CM | POA: Diagnosis not present

## 2017-04-16 DIAGNOSIS — I10 Essential (primary) hypertension: Secondary | ICD-10-CM

## 2017-04-16 NOTE — Patient Instructions (Addendum)
Your cholesterol has dropped and is now EXCELLENT!  Turmeric   And magnesium both help many people with muscle cramps   400 mg of magnesium  Oxide  Up to 2 daily   Turmeric is available  in 400 mg capsules  .you can take it as needed or up to twice daily   Your diabetes remains well controlled on  Diet alone,  But you A1c is slightly higher so limit your starches and make sure you are walking daily for exercise    I'll see you in 3 months   May God bless you with health and prosperity in the [place]!  Regards,   Deborra Medina, MD

## 2017-04-18 ENCOUNTER — Encounter: Payer: Self-pay | Admitting: Internal Medicine

## 2017-04-18 NOTE — Progress Notes (Signed)
Subjective:  Patient ID: Kim Sanchez, female    DOB: Aug 29, 1940  Age: 76 y.o. MRN: [id number]  CC: The primary encounter diagnosis was Diabetes mellitus with no complication ([place]). Diagnoses of Pure hypercholesterolemia and Essential hypertension were also pertinent to this visit.  HPI ANDELYN SPADE presents for follow up on type 2 diabetes,  hypertension and hyperlipidemia.  Patient is taking her medications as prescribed and notes no adverse effects except for a few muscle cramps at night. .  Home BP readings have been done about once per week and are  generally < 130/80 .  She is avoiding added salt in her diet but not  walking regularly or doing any form of exercise.   Outpatient Medications Prior to Visit  Medication Sig Dispense Refill  . amLODipine (NORVASC) 5 MG tablet Take 1 tablet (5 mg total) by mouth daily. (Patient taking differently: Take 5 mg by mouth 2 (two) times daily. ) 90 tablet 0  . anastrozole (ARIMIDEX) 1 MG tablet Take 1 tablet (1 mg total) by mouth daily. 90 tablet 0  . aspirin EC 81 MG tablet Take 81 mg by mouth daily.    Marland Kitchen atorvastatin (LIPITOR) 10 MG tablet Take 1 tablet (10 mg total) by mouth daily. (Patient taking differently: Take 10 mg by mouth every other day. ) 90 tablet 3  . cholecalciferol (VITAMIN D) 1000 units tablet Take 1,000 Units by mouth daily.    Marland Kitchen inFLIXimab (REMICADE) 100 MG injection Inject 1 mg into the vein every 8 (eight) weeks.    Marland Kitchen loratadine (CLARITIN) 10 MG tablet Take 1 tablet (10 mg total) by mouth daily. (Patient taking differently: Take 10 mg by mouth daily as needed. ) 30 tablet 5  . LORazepam (ATIVAN) 0.5 MG tablet TAKE 1 TABLET BY MOUTH EVERY 8 HOURS AS NEEDED FOR ANXIETY 90 tablet 0  . Omega-3 Fatty Acids (FISH OIL PO) Take by mouth.    . polyethylene glycol powder (GLYCOLAX/MIRALAX) powder Take by mouth.    . vitamin E 400 UNIT capsule Take by mouth.     No facility-administered medications prior to visit.     Review of  Systems;  Patient denies headache, fevers, malaise, unintentional weight loss, skin rash, eye pain, sinus congestion and sinus pain, sore throat, dysphagia,  hemoptysis , cough, dyspnea, wheezing, chest pain, palpitations, orthopnea, edema, abdominal pain, nausea, melena, diarrhea, constipation, flank pain, dysuria, hematuria, urinary  Frequency, nocturia, numbness, tingling, seizures,  Focal weakness, Loss of consciousness,  Tremor, insomnia, depression, anxiety, and suicidal ideation.      Objective:  BP (!) 144/88 (BP Location: Left Arm, Patient Position: Sitting, Cuff Size: Normal)   Pulse 79   Temp 98.1 F (36.7 C) (Oral)   Resp 15   Ht 5' 1.5" (1.562 m)   Wt 139 lb 9.6 oz (63.3 kg)   SpO2 95%   BMI 25.95 kg/m   BP Readings from Last 3 Encounters:  04/16/17 (!) 144/88  04/14/17 132/70  03/04/17 136/77    Wt Readings from Last 3 Encounters:  04/16/17 139 lb 9.6 oz (63.3 kg)  04/14/17 140 lb 6.4 oz (63.7 kg)  03/04/17 140 lb 3.2 oz (63.6 kg)    General appearance: alert, cooperative and appears stated age Ears: normal TM's and external ear canals both ears Throat: lips, mucosa, and tongue normal; teeth and gums normal Neck: no adenopathy, no carotid bruit, supple, symmetrical, trachea midline and thyroid not enlarged, symmetric, no tenderness/mass/nodules Back: symmetric, no curvature. ROM  normal. No CVA tenderness. Lungs: clear to auscultation bilaterally Heart: regular rate and rhythm, S1, S2 normal, no murmur, click, rub or gallop Abdomen: soft, non-tender; bowel sounds normal; no masses,  no organomegaly Pulses: 2+ and symmetric Skin: Skin color, texture, turgor normal. No rashes or lesions Lymph nodes: Cervical, supraclavicular, and axillary nodes normal.  Lab Results  Component Value Date   HGBA1C 6.7 (H) 04/15/2017   HGBA1C 6.4 01/05/2017   HGBA1C 6.6 (H) 09/30/2016    Lab Results  Component Value Date   CREATININE 0.69 04/15/2017   CREATININE 0.76  01/11/2017   CREATININE 0.74 01/05/2017    Lab Results  Component Value Date   WBC 5.8 12/31/2016   HGB 13.5 12/31/2016   HCT 39.7 12/31/2016   PLT 215 12/31/2016   GLUCOSE 121 (H) 04/15/2017   CHOL 179 04/15/2017   TRIG 79.0 04/15/2017   HDL 84.00 04/15/2017   LDLDIRECT 151.5 10/03/2013   LDLCALC 80 04/15/2017   ALT 14 04/15/2017   AST 16 04/15/2017   NA 141 04/15/2017   K 3.8 04/15/2017   CL 104 04/15/2017   CREATININE 0.69 04/15/2017   BUN 11 04/15/2017   CO2 31 04/15/2017   TSH 2.39 09/30/2016   HGBA1C 6.7 (H) 04/15/2017   MICROALBUR 1.2 04/15/2017    Dg Bone Density  Result Date: 03/02/2017 EXAM: DUAL X-RAY ABSORPTIOMETRY (DXA) FOR BONE MINERAL DENSITY IMPRESSION: Dear Dr Grayland Ormond, Your patient Curtistine Pettitt completed a BMD test on 03/02/2017 using the [place] (analysis version: 14.10) manufactured by [employer]. The following summarizes the results of our evaluation. PATIENT BIOGRAPHICAL: Name: Kim, [name] Sanchez Patient ID: [id number] Birth Date: 17-Jul-1940 Height: 62.0 in. Gender: Female Exam Date: 03/02/2017 Weight: 138.8 lbs. Indications: Breast CA, Advanced Age, Postmenopausal, High Risk Meds, Family Hx of Osteoporosis, History of Breast Cancer, Caucasian, Parent Hip Fracture, Family Hist. (Parent hip fracture) Fractures: Treatments: letrozole, anastrozole, CALCIUM VIT D ASSESSMENT: The BMD measured at AP Spine L1-L4 is 0.983 g/cm2 with a T-score of -1.7. This patient is considered osteopenic according to [place] [facility]) criteria. Site Region Measured Measured WHO Young Adult BMD Date       Age      Classification T-score AP Spine L1-L4 03/02/2017 75.8 Osteopenia -1.7 0.983 g/cm2 AP Spine L1-L4 01/27/2016 74.7 Osteopenia -1.5 1.009 g/cm2 AP Spine L1-L4 01/23/2014 72.7 Normal -1.0 1.070 g/cm2 AP Spine L1-L4 01/17/2013 71.7 Normal -0.6 1.120 g/cm2 DualFemur Neck Right 03/02/2017 75.8 Osteopenia -1.1 0.885 g/cm2 DualFemur Neck Right 01/27/2016 74.7  Osteopenia -1.2 0.876 g/cm2 DualFemur Neck Right 01/23/2014 72.7 Normal -0.8 0.922 g/cm2 DualFemur Neck Right 01/17/2013 71.7 Normal -1.0 0.905 g/cm2 World Health Organization [facility]) criteria for post-menopausal, Caucasian Women: Normal:       T-score at or above -1 SD Osteopenia:   T-score between -1 and -2.5 SD Osteoporosis: T-score at or below -2.5 SD RECOMMENDATIONS: [employer] recommends that FDA-approved medical therapies be considered in postmenopausal women and men age 31 or older with a: 1. Hip or vertebral (clinical or morphometric) fracture. 2. T-score of < -2.5 at the spine or hip. 3. Ten-year fracture probability by FRAX of 3% or greater for hip fracture or 20% or greater for major osteoporotic fracture. All treatment decisions require clinical judgment and consideration of individual patient factors, including patient preferences, co-morbidities, previous drug use, risk factors not captured in the FRAX model (e.g. falls, vitamin D deficiency, increased bone turnover, interval significant decline in bone density) and possible under - or  over-estimation of fracture risk by FRAX. All patients should ensure an adequate intake of dietary calcium (1200 mg/d) and vitamin D (800 IU daily) unless contraindicated. FOLLOW-UP: People with diagnosed cases of osteoporosis or at high risk for fracture should have regular bone mineral density tests. For patients eligible for Medicare, routine testing is allowed once every 2 years. The testing frequency can be increased to one year for patients who have rapidly progressing disease, those who are receiving or discontinuing medical therapy to restore bone mass, or have additional risk factors. FRAX* RESULTS:  (version: 3.5) 10-year Probability of Fracture1 Major Osteoporotic Fracture2 Hip Fracture 16.8% 7.5% Population: [place] (Caucasian) Risk Factors: Family Hist. (Parent hip fracture) Based on Femur (Right) Neck BMD 1 -The 10-year probability of  fracture may be lower than reported if the patient has received treatment. 2 -Major Osteoporotic Fracture: Clinical Spine, Forearm, Hip or Shoulder *FRAX is a Materials engineer of the [employer] for Metabolic Bone Disease, a [place] (WHO) [facility]. ASSESSMENT: The probability of a major osteoporotic fracture is 16.8% within the next ten years. The probability of a hip fracture is 7.5% within the next ten years. Electronically Signed   By: Earle Gell M.D.   On: 03/02/2017 14:19    Assessment & Plan:   Problem List Items Addressed This Visit    Diabetes mellitus with no complication ([place]) - Primary    Remains well-controlled on diet alone. Intolerant of ACE Sanchez due to cough.  No proteinuria.  Foot exam normal today. Statin therapy advised and cautiously tolerating atorvastatin every other day .  Continue aspirin and initiate trial of atorvastatin.   Lab Results  Component Value Date   HGBA1C 6.7 (H) 04/15/2017   Lab Results  Component Value Date   MICROALBUR 1.2 04/15/2017     Lab Results  Component Value Date   CHOL 179 04/15/2017   HDL 84.00 04/15/2017   LDLCALC 80 04/15/2017   LDLDIRECT 151.5 10/03/2013   TRIG 79.0 04/15/2017   CHOLHDL 2 04/15/2017         Relevant Orders   Hemoglobin A1c   Comprehensive metabolic panel   HLD (hyperlipidemia)    At goal on qod atorvastatin.  No changes toda.  Lab Results  Component Value Date   CHOL 179 04/15/2017   HDL 84.00 04/15/2017   LDLCALC 80 04/15/2017   LDLDIRECT 151.5 10/03/2013   TRIG 79.0 04/15/2017   CHOLHDL 2 04/15/2017   Lab Results  Component Value Date   ALT 14 04/15/2017   AST 16 04/15/2017   ALKPHOS 71 04/15/2017   BILITOT 0.9 04/15/2017         Relevant Orders   Lipid panel   Hypertension    Well controlled on current regimen. Renal function stable, no changes today.  Lab Results  Component Value Date   CREATININE 0.69 04/15/2017   Lab  Results  Component Value Date   NA 141 04/15/2017   K 3.8 04/15/2017   CL 104 04/15/2017   CO2 31 04/15/2017          A total of 25 minutes of face to face time was spent with patient more than half of which was spent in counselling about the above mentioned conditions  and coordination of care   Sanchez am having Herbert Pun Sanchez. Kozub "AGGIE" maintain her inFLIXimab, aspirin EC, loratadine, Omega-3 Fatty Acids (FISH OIL PO), vitamin E, polyethylene glycol powder, cholecalciferol, atorvastatin, anastrozole, amLODipine, and LORazepam.  No orders  of the defined types were placed in this encounter.   There are no discontinued medications.  Follow-up: Return in about 3 months (around 07/15/2017) for follow up diabetes.   [name], MD

## 2017-04-18 NOTE — Assessment & Plan Note (Signed)
Remains well-controlled on diet alone. Intolerant of ACE I due to cough.  No proteinuria.  Foot exam normal today. Statin therapy advised and cautiously tolerating atorvastatin every other day .  Continue aspirin and initiate trial of atorvastatin.   Lab Results  Component Value Date   HGBA1C 6.7 (H) 04/15/2017   Lab Results  Component Value Date   MICROALBUR 1.2 04/15/2017     Lab Results  Component Value Date   CHOL 179 04/15/2017   HDL 84.00 04/15/2017   LDLCALC 80 04/15/2017   LDLDIRECT 151.5 10/03/2013   TRIG 79.0 04/15/2017   CHOLHDL 2 04/15/2017

## 2017-04-18 NOTE — Assessment & Plan Note (Signed)
Well controlled on current regimen. Renal function stable, no changes today.  Lab Results  Component Value Date   CREATININE 0.69 04/15/2017   Lab Results  Component Value Date   NA 141 04/15/2017   K 3.8 04/15/2017   CL 104 04/15/2017   CO2 31 04/15/2017

## 2017-04-18 NOTE — Assessment & Plan Note (Signed)
At goal on qod atorvastatin.  No changes toda.  Lab Results  Component Value Date   CHOL 179 04/15/2017   HDL 84.00 04/15/2017   LDLCALC 80 04/15/2017   LDLDIRECT 151.5 10/03/2013   TRIG 79.0 04/15/2017   CHOLHDL 2 04/15/2017   Lab Results  Component Value Date   ALT 14 04/15/2017   AST 16 04/15/2017   ALKPHOS 71 04/15/2017   BILITOT 0.9 04/15/2017

## 2017-04-28 ENCOUNTER — Other Ambulatory Visit: Payer: Self-pay | Admitting: Oncology

## 2017-04-28 DIAGNOSIS — K5080 Crohn's disease of both small and large intestine without complications: Secondary | ICD-10-CM | POA: Diagnosis not present

## 2017-05-07 ENCOUNTER — Encounter: Payer: Self-pay | Admitting: Internal Medicine

## 2017-05-26 ENCOUNTER — Other Ambulatory Visit: Payer: Self-pay | Admitting: Internal Medicine

## 2017-05-27 NOTE — Telephone Encounter (Signed)
Refilled: 04/08/2017 Last OV: 04/16/2017 Next OV: 06/28/2017

## 2017-05-28 ENCOUNTER — Other Ambulatory Visit: Payer: Self-pay | Admitting: Internal Medicine

## 2017-05-28 NOTE — Telephone Encounter (Signed)
Printed, signed and faxed.  

## 2017-06-11 ENCOUNTER — Ambulatory Visit (INDEPENDENT_AMBULATORY_CARE_PROVIDER_SITE_OTHER): Payer: Medicare Other | Admitting: General Surgery

## 2017-06-11 ENCOUNTER — Encounter: Payer: Self-pay | Admitting: General Surgery

## 2017-06-11 VITALS — BP 128/76 | HR 73 | Resp 12 | Ht 61.0 in | Wt 150.0 lb

## 2017-06-11 DIAGNOSIS — C50811 Malignant neoplasm of overlapping sites of right female breast: Secondary | ICD-10-CM | POA: Diagnosis not present

## 2017-06-11 DIAGNOSIS — Z170 Estrogen receptor positive status [ER+]: Secondary | ICD-10-CM | POA: Diagnosis not present

## 2017-06-11 DIAGNOSIS — N644 Mastodynia: Secondary | ICD-10-CM

## 2017-06-11 NOTE — Progress Notes (Signed)
Patient ID: Kim Sanchez, female   DOB: 02-Oct-1940, 77 y.o.   MRN: [id number]  Chief Complaint  Patient presents with  . Follow-up    right breast    HPI Kim Sanchez is a 77 y.o. female here today for a evaluation for right nipple itching and sensitive. She noticed this about a week and a half now. No pain or redness but nipple swelling. Has been using  Vaseline on her nipple. Last mammogram 10/01/2016.  The patient's pain/itching first became evident after she was raking leaves, more recently when she has been working out at [employer] and especially when she uses some of the machines where her chest bears the pressure as she does sit ups.  The patient's husband continues to recover from his stroke 2 years ago, but is far less active than he had been in the past. HPI  Past Medical History:  Diagnosis Date  . Arthritis   . 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  . Crohn's disease ([place])   . GERD (gastroesophageal reflux disease)   . History of blood transfusion 1960  . Hypertension   . Personal history of radiation therapy     Past Surgical History:  Procedure Laterality Date  . BRAIN SURGERY  1980   breast biopsies, benign  . BREAST BIOPSY Right 2014   +  . BREAST BIOPSY Bilateral    neg  . BREAST LUMPECTOMY Right May 2014   T1c, N0; ER/PR positive, HER-2/neu not overexpressing.  Marland Kitchen BREAST SURGERY  1980's   fibro cyst both breast in [employer]  . COLONOSCOPY  2017  . DILATION AND CURETTAGE OF UTERUS      Family History  Problem Relation Age of Onset  . Cancer Paternal Grandmother   . Heart disease Sister   . Diabetes Sister   . Breast cancer Neg Hx     Social History Social History   Tobacco Use  . Smoking status: Never Smoker  . Smokeless tobacco: Never Used  Substance Use Topics  . Alcohol use: Yes   Alcohol/week: 0.6 oz    Types: 1 Glasses of wine per week    Comment: OCC  . Drug use: No    Allergies  Allergen Reactions  . Letrozole Anaphylaxis and Itching  . Amlodipine Itching  . Exemestane Nausea Only  . Hydrochlorothiazide Other (See Comments)  . Lexapro [Escitalopram Oxalate] Other (See Comments)    intolerance  . Lisinopril Cough  . Sulfa Antibiotics Other (See Comments)    Current Outpatient Medications  Medication Sig Dispense Refill  . amLODipine (NORVASC) 5 MG tablet Take 1 tablet (5 mg total) by mouth daily. (Patient taking differently: Take 5 mg by mouth 2 (two) times daily. ) 90 tablet 0  . anastrozole (ARIMIDEX) 1 MG tablet TAKE 1 TABLET BY MOUTH ONCE DAILY 90 tablet 0  . aspirin EC 81 MG tablet Take 81 mg by mouth daily.    Marland Kitchen atorvastatin (LIPITOR) 10 MG tablet Take 1 tablet (10 mg total) by mouth daily. (Patient taking differently: Take 10 mg by mouth every other day. ) 90 tablet 3  . cholecalciferol (VITAMIN D) 1000 units tablet Take 1,000 Units by mouth daily.    Marland Kitchen inFLIXimab (REMICADE) 100 MG injection Inject 1 mg into the vein every 8 (eight) weeks.    Marland Kitchen loratadine (CLARITIN) 10 MG tablet  Take 1 tablet (10 mg total) by mouth daily. (Patient taking differently: Take 10 mg by mouth daily as needed. ) 30 tablet 5  . LORazepam (ATIVAN) 0.5 MG tablet TAKE 1 TABLET BY MOUTH EVERY 8 HOURS AS NEEDED FOR ANXIETY 90 tablet 0  . Omega-3 Fatty Acids (FISH OIL PO) Take by mouth.    . polyethylene glycol powder (GLYCOLAX/MIRALAX) powder Take by mouth.    . vitamin E 400 UNIT capsule Take by mouth.     No current facility-administered medications for this visit.     Review of Systems Review of Systems  Constitutional: Negative.   Respiratory: Negative.   Cardiovascular: Negative.     Blood pressure 128/76, pulse 73, resp. rate 12, height 5' 1"  (1.549 m), weight 150 lb (68 kg).  Physical Exam Physical Exam  Constitutional: She is oriented to person, place, and  time. She appears well-developed and well-nourished.  Cardiovascular: Normal rate and regular rhythm.  Murmur heard.  Systolic murmur is present with a grade of 2/6. Pulmonary/Chest: Effort normal and breath sounds normal. Right breast exhibits no inverted nipple, no mass, no nipple discharge, no skin change and no tenderness. Left breast exhibits no inverted nipple, no mass, no nipple discharge, no skin change and no tenderness.    Lymphadenopathy:    She has no cervical adenopathy.    She has no axillary adenopathy.  Neurological: She is alert and oriented to person, place, and time.  Skin: Skin is warm and dry.    Data Reviewed Bone density of March 02, 2017 showed no interval change, persistent osteopenia. Bilateral l diagnostic mammograms of Oct 01, 2016 BI-RADS-2.  Assessment    Local chest discomfort related to pressure effects on the nipple areolar complex.    Plan The tissue of the right nipple and areola is stiffer related to her prior surgery and radiation.  Pressure to this area is causing the patient's present pain.  No evidence of recurrent tumor. Patient to return as scheduled.  Patient is coming up on a 5-year mark for treatment with an aromatase inhibitor.  We will arrange for BCI testing to see if she will benefit from continued antiestrogen therapy.  The patient may use a pad for the inside the bra.  The patient is aware to call back for any questions or concerns.   HPI, Physical Exam, Assessment and Plan have been scribed under the direction and in the presence of Hervey Ard, MD.  Gaspar Cola, CMA  I have completed the exam and reviewed the above documentation for accuracy and completeness.  I agree with the above.  Haematologist has been used and any errors in dictation or transcription are unintentional.  Hervey Ard, M.D., F.A.C.S.  Kim Sanchez 06/11/2017, 1:13 PM

## 2017-06-11 NOTE — Patient Instructions (Signed)
Patient to return as scheduled.  Patient may use a pad for the inside the bra.  The patient is aware to call back for any questions or concerns.

## 2017-06-15 ENCOUNTER — Telehealth: Payer: Self-pay | Admitting: *Deleted

## 2017-06-15 NOTE — Telephone Encounter (Signed)
-----   Message from [name], MD sent at 06/11/2017  1:13 PM EST ----- Please contact pathology to send tumor block for BCI testing. Thanks.

## 2017-06-15 NOTE — Telephone Encounter (Signed)
Order and pathology faxed to [facility] as requested.

## 2017-06-23 ENCOUNTER — Ambulatory Visit: Payer: Medicare Other | Admitting: General Surgery

## 2017-06-24 DIAGNOSIS — K5080 Crohn's disease of both small and large intestine without complications: Secondary | ICD-10-CM | POA: Diagnosis not present

## 2017-06-28 ENCOUNTER — Other Ambulatory Visit: Payer: Self-pay | Admitting: Radiology

## 2017-06-28 ENCOUNTER — Encounter: Payer: Self-pay | Admitting: Internal Medicine

## 2017-06-28 ENCOUNTER — Ambulatory Visit (INDEPENDENT_AMBULATORY_CARE_PROVIDER_SITE_OTHER): Payer: Medicare Other | Admitting: Internal Medicine

## 2017-06-28 VITALS — BP 150/80 | HR 73 | Temp 98.2°F | Resp 14 | Ht 61.0 in | Wt 138.8 lb

## 2017-06-28 DIAGNOSIS — R82998 Other abnormal findings in urine: Secondary | ICD-10-CM

## 2017-06-28 DIAGNOSIS — R252 Cramp and spasm: Secondary | ICD-10-CM | POA: Diagnosis not present

## 2017-06-28 DIAGNOSIS — E119 Type 2 diabetes mellitus without complications: Secondary | ICD-10-CM | POA: Diagnosis not present

## 2017-06-28 DIAGNOSIS — F5102 Adjustment insomnia: Secondary | ICD-10-CM

## 2017-06-28 DIAGNOSIS — I10 Essential (primary) hypertension: Secondary | ICD-10-CM

## 2017-06-28 LAB — URINALYSIS, ROUTINE W REFLEX MICROSCOPIC
Bilirubin Urine: NEGATIVE
HGB URINE DIPSTICK: NEGATIVE
Ketones, ur: NEGATIVE
LEUKOCYTES UA: NEGATIVE
Nitrite: NEGATIVE
PH: 6.5 (ref 5.0–8.0)
RBC / HPF: NONE SEEN (ref 0–?)
Specific Gravity, Urine: 1.02 (ref 1.000–1.030)
TOTAL PROTEIN, URINE-UPE24: NEGATIVE
URINE GLUCOSE: NEGATIVE
UROBILINOGEN UA: 0.2 (ref 0.0–1.0)
WBC, UA: NONE SEEN (ref 0–?)

## 2017-06-28 LAB — MICROALBUMIN / CREATININE URINE RATIO
Creatinine,U: 93.2 mg/dL
MICROALB/CREAT RATIO: 0.8 mg/g (ref 0.0–30.0)
Microalb, Ur: <0.7 mg/dL (ref 0.0–1.9)

## 2017-06-28 MED ORDER — FUROSEMIDE 20 MG PO TABS: 20.0000 mg | ORAL_TABLET | Freq: Every day | ORAL | 0 refills | Status: DC

## 2017-06-28 MED ORDER — AMLODIPINE BESYLATE 2.5 MG PO TABS: 2.5000 mg | ORAL_TABLET | Freq: Every day | ORAL | 2 refills | Status: DC

## 2017-06-28 MED ORDER — OSELTAMIVIR PHOSPHATE 75 MG PO CAPS: 75.0000 mg | ORAL_CAPSULE | Freq: Every day | ORAL | 0 refills | Status: DC

## 2017-06-28 NOTE — Progress Notes (Signed)
Subjective:  Patient ID: Kim Sanchez, female    DOB: 1940-08-19  Age: 77 y.o. MRN: [id number]  CC: The primary encounter diagnosis was Foamy urine. Diagnoses of Essential hypertension, Diabetes mellitus with no complication ([place]), Muscle cramps, and Insomnia due to stress were also pertinent to this visit.  HPI Kim Sanchez presents for 3 month follow up on multiple conditions including GAD, hypertension  and type 2 diabetes.  Patient has no complaints today.  Patient is following a low glycemic index diet and taking all prescribed blood pressure  medications but lowered her dose of amlodipine to 2.5 mg due to increased palpitations at higher dose. .  She checks BS rarely and notes that her fasting sugars have been under less than 140 most of the time and post prandials have been under 160 except on rare occasions. Patient is not exercising or  intentionally trying to lose weight .  Patient has had an eye exam in the last 12 months and checks feet regularly for signs of infection.  Patient does not walk barefoot outside,  And denies an numbness tingling or burning in feet. Patient is up to date on all recommended vaccinations   currenlty taking  Statin every other day  And lasix as needed   Did not tolerate daily aspirin due to nosebleeds.    Not sleeping well  Due to persistent anxiety,  No prior trial of melotonin  Has been having recurrent cramping of fingers and toes,   Wants to take potassium recommended by sister in law.  Wants to now if 'detox" tea is a good supplement to try,   Has constipation.  Urine is foamy again,  Wants it rechecked.   Lab Results  Component Value Date   HGBA1C 6.7 (H) 04/15/2017     Outpatient Medications Prior to Visit  Medication Sig Dispense Refill  . anastrozole (ARIMIDEX) 1 MG tablet TAKE 1 TABLET BY MOUTH ONCE DAILY 90 tablet 0  . aspirin EC 81 MG tablet Take 81 mg by mouth daily.    Marland Kitchen atorvastatin (LIPITOR) 10 MG tablet Take 1 tablet (10 mg  total) by mouth daily. (Patient taking differently: Take 10 mg by mouth every other day. ) 90 tablet 3  . cholecalciferol (VITAMIN D) 1000 units tablet Take 1,000 Units by mouth daily.    Marland Kitchen inFLIXimab (REMICADE) 100 MG injection Inject 1 mg into the vein every 8 (eight) weeks.    Marland Kitchen loratadine (CLARITIN) 10 MG tablet Take 1 tablet (10 mg total) by mouth daily. (Patient taking differently: Take 10 mg by mouth daily as needed. ) 30 tablet 5  . LORazepam (ATIVAN) 0.5 MG tablet TAKE 1 TABLET BY MOUTH EVERY 8 HOURS AS NEEDED FOR ANXIETY 90 tablet 0  . Omega-3 Fatty Acids (FISH OIL PO) Take by mouth.    . polyethylene glycol powder (GLYCOLAX/MIRALAX) powder Take by mouth.    . vitamin E 400 UNIT capsule Take by mouth.    Marland Kitchen amLODipine (NORVASC) 5 MG tablet Take 1 tablet (5 mg total) by mouth daily. (Patient taking differently: Take 2.5 mg by mouth daily. ) 90 tablet 0   No facility-administered medications prior to visit.     Review of Systems;  Patient denies headache, fevers, malaise, unintentional weight loss, skin rash, eye pain, sinus congestion and sinus pain, sore throat, dysphagia,  hemoptysis , cough, dyspnea, wheezing, chest pain, palpitations, orthopnea, edema, abdominal pain, nausea, melena, diarrhea, constipation, flank pain, dysuria, hematuria, urinary  Frequency, nocturia, numbness,  tingling, seizures,  Focal weakness, Loss of consciousness,  Tremor, insomnia, depression, anxiety, and suicidal ideation.      Objective:  BP (!) 150/80 (BP Location: Left Arm, Patient Position: Sitting, Cuff Size: Normal)   Pulse 73   Temp 98.2 F (36.8 C) (Oral)   Resp 14   Ht 5' 1"  (1.549 m)   Wt 138 lb 12.8 oz (63 kg)   SpO2 97%   BMI 26.23 kg/m   BP Readings from Last 3 Encounters:  06/28/17 (!) 150/80  06/11/17 128/76  04/16/17 (!) 144/88    Wt Readings from Last 3 Encounters:  06/28/17 138 lb 12.8 oz (63 kg)  06/11/17 150 lb (68 kg)  04/16/17 139 lb 9.6 oz (63.3 kg)    General  appearance: alert, cooperative and appears stated age Ears: normal TM's and external ear canals both ears Throat: lips, mucosa, and tongue normal; teeth and gums normal Neck: no adenopathy, no carotid bruit, supple, symmetrical, trachea midline and thyroid not enlarged, symmetric, no tenderness/mass/nodules Back: symmetric, no curvature. ROM normal. No CVA tenderness. Lungs: clear to auscultation bilaterally Heart: regular rate and rhythm, S1, S2 normal, no murmur, click, rub or gallop Abdomen: soft, non-tender; bowel sounds normal; no masses,  no organomegaly Pulses: 2+ and symmetric Skin: Skin color, texture, turgor normal. No rashes or lesions Lymph nodes: Cervical, supraclavicular, and axillary nodes normal.  Lab Results  Component Value Date   HGBA1C 6.7 (H) 04/15/2017   HGBA1C 6.4 01/05/2017   HGBA1C 6.6 (H) 09/30/2016    Lab Results  Component Value Date   CREATININE 0.69 04/15/2017   CREATININE 0.76 01/11/2017   CREATININE 0.74 01/05/2017    Lab Results  Component Value Date   WBC 5.8 12/31/2016   HGB 13.5 12/31/2016   HCT 39.7 12/31/2016   PLT 215 12/31/2016   GLUCOSE 121 (H) 04/15/2017   CHOL 179 04/15/2017   TRIG 79.0 04/15/2017   HDL 84.00 04/15/2017   LDLDIRECT 151.5 10/03/2013   LDLCALC 80 04/15/2017   ALT 14 04/15/2017   AST 16 04/15/2017   NA 141 04/15/2017   K 3.8 04/15/2017   CL 104 04/15/2017   CREATININE 0.69 04/15/2017   BUN 11 04/15/2017   CO2 31 04/15/2017   TSH 2.39 09/30/2016   HGBA1C 6.7 (H) 04/15/2017   MICROALBUR <0.7 06/28/2017    Dg Bone Density  Result Date: 03/02/2017 EXAM: DUAL X-RAY ABSORPTIOMETRY (DXA) FOR BONE MINERAL DENSITY IMPRESSION: Dear Dr Grayland Ormond, Your patient Kim Sanchez completed a BMD test on 03/02/2017 using the [place] (analysis version: 14.10) manufactured by [employer]. The following summarizes the results of our evaluation. PATIENT BIOGRAPHICAL: Name: Kim Sanchez, Kim Sanchez Patient ID: [id number] Birth  Date: 04/29/1941 Height: 62.0 in. Gender: Female Exam Date: 03/02/2017 Weight: 138.8 lbs. Indications: Breast CA, Advanced Age, Postmenopausal, High Risk Meds, Family Hx of Osteoporosis, History of Breast Cancer, Caucasian, Parent Hip Fracture, Family Hist. (Parent hip fracture) Fractures: Treatments: letrozole, anastrozole, CALCIUM VIT D ASSESSMENT: The BMD measured at AP Spine L1-L4 is 0.983 g/cm2 with a T-score of -1.7. This patient is considered osteopenic according to [facility]) criteria. Site Region Measured Measured WHO Young Adult BMD Date       Age      Classification T-score AP Spine L1-L4 03/02/2017 75.8 Osteopenia -1.7 0.983 g/cm2 AP Spine L1-L4 01/27/2016 74.7 Osteopenia -1.5 1.009 g/cm2 AP Spine L1-L4 01/23/2014 72.7 Normal -1.0 1.070 g/cm2 AP Spine L1-L4 01/17/2013 71.7 Normal -0.6 1.120 g/cm2 DualFemur Neck  Right 03/02/2017 75.8 Osteopenia -1.1 0.885 g/cm2 DualFemur Neck Right 01/27/2016 74.7 Osteopenia -1.2 0.876 g/cm2 DualFemur Neck Right 01/23/2014 72.7 Normal -0.8 0.922 g/cm2 DualFemur Neck Right 01/17/2013 71.7 Normal -1.0 0.905 g/cm2 World Health Organization [facility]) criteria for post-menopausal, Caucasian Women: Normal:       T-score at or above -1 SD Osteopenia:   T-score between -1 and -2.5 SD Osteoporosis: T-score at or below -2.5 SD RECOMMENDATIONS: [place] recommends that FDA-approved medical therapies be considered in postmenopausal women and men age 40 or older with a: 1. Hip or vertebral (clinical or morphometric) fracture. 2. T-score of < -2.5 at the spine or hip. 3. Ten-year fracture probability by FRAX of 3% or greater for hip fracture or 20% or greater for major osteoporotic fracture. All treatment decisions require clinical judgment and consideration of individual patient factors, including patient preferences, co-morbidities, previous drug use, risk factors not captured in the FRAX model (e.g. falls, vitamin D deficiency, increased bone  turnover, interval significant decline in bone density) and possible under - or over-estimation of fracture risk by FRAX. All patients should ensure an adequate intake of dietary calcium (1200 mg/d) and vitamin D (800 IU daily) unless contraindicated. FOLLOW-UP: People with diagnosed cases of osteoporosis or at high risk for fracture should have regular bone mineral density tests. For patients eligible for Medicare, routine testing is allowed once every 2 years. The testing frequency can be increased to one year for patients who have rapidly progressing disease, those who are receiving or discontinuing medical therapy to restore bone mass, or have additional risk factors. FRAX* RESULTS:  (version: 3.5) 10-year Probability of Fracture1 Major Osteoporotic Fracture2 Hip Fracture 16.8% 7.5% Population: [place] (Caucasian) Risk Factors: Family Hist. (Parent hip fracture) Based on Femur (Right) Neck BMD 1 -The 10-year probability of fracture may be lower than reported if the patient has received treatment. 2 -Major Osteoporotic Fracture: Clinical Spine, Forearm, Hip or Shoulder *FRAX is a Materials engineer of the [employer] for Metabolic Bone Disease, a [place] (WHO) [facility]. ASSESSMENT: The probability of a major osteoporotic fracture is 16.8% within the next ten years. The probability of a hip fracture is 7.5% within the next ten years. Electronically Signed   By: Earle Gell M.D.   On: 03/02/2017 14:19    Assessment & Plan:   Problem List Items Addressed This Visit    Hypertension    Home readings are mostly < 140/80 and she is intolerant of more aggressive therapy.  May try toprol in the future if palpitations become more bothersome       Relevant Medications   amLODipine (NORVASC) 2.5 MG tablet   furosemide (LASIX) 20 MG tablet   Other Relevant Orders   Basic Metabolic Panel (BMET)   Diabetes mellitus with no complication ([place])    Remains  well-controlled on diet alone. Intolerant of ACE Sanchez due to cough.  No proteinuria.  Foot exam normal today. Statin therapy advised,  She is  cautiously tolerating atorvastatin every other day . reducing aspirin to once weekly due to nosebleeds .   Lab Results  Component Value Date   HGBA1C 6.7 (H) 04/15/2017   Lab Results  Component Value Date   MICROALBUR <0.7 06/28/2017     Lab Results  Component Value Date   CHOL 179 04/15/2017   HDL 84.00 04/15/2017   LDLCALC 80 04/15/2017   LDLDIRECT 151.5 10/03/2013   TRIG 79.0 04/15/2017   CHOLHDL 2 04/15/2017  Muscle cramps    Affecting fingers and toes,  No major muscle groups.  Encouraged to continue statin,  Try turmeric and magnesium supplements.       Insomnia due to stress    Recommend trial of melatonin.       Foamy urine - Primary    Ua is again normal and she has no microalbuminuria       Relevant Orders   Urinalysis, Routine w reflex microscopic (Completed)   Microalbumin / creatinine urine ratio (Completed)      Sanchez have changed Kim Sanchez "AGGIE"'s amLODipine. Sanchez am also having her start on furosemide and oseltamivir. Additionally, Sanchez am having her maintain her inFLIXimab, aspirin EC, loratadine, Omega-3 Fatty Acids (FISH OIL PO), vitamin E, polyethylene glycol powder, cholecalciferol, atorvastatin, anastrozole, and LORazepam.  Meds ordered this encounter  Medications  . amLODipine (NORVASC) 2.5 MG tablet    Sig: Take 1 tablet (2.5 mg total) by mouth daily.    Dispense:  90 tablet    Refill:  2  . furosemide (LASIX) 20 MG tablet    Sig: Take 1 tablet (20 mg total) by mouth daily. As needed for fluid retention    Dispense:  90 tablet    Refill:  0  . oseltamivir (TAMIFLU) 75 MG capsule    Sig: Take 1 capsule (75 mg total) by mouth daily.    Dispense:  10 capsule    Refill:  0    Medications Discontinued During This Encounter  Medication Reason  . amLODipine (NORVASC) 5 MG tablet     Follow-up:  Return in about 3 months (around 10/04/2017) for follow up diabetes.   [name], MD

## 2017-06-28 NOTE — Patient Instructions (Addendum)
Continue 2.5 mg amlodipine daily for blood pressure  You can take the furosemide  every other day,,  Or 1/2 tablet daily    For fluid retention   You do not need a potassium supplement unless your potassium is low (and it has never been low)  You can reduce your aspirin dose to once a week to reduce the bruising and bleeding and still protect you    Melatonin is a natural sleep aide and 5 mg is a good dose    You can try taking Magnesium supplements (400 mg ) or turmeric( 500 mg )  To  help your toe and finger cramps  If your palpitations become more distressing,  We can add Metoprolol XL 25 mg to your blood pressure regimen and stop the amldoipine     Avoid "detox " programs! They usually cause diarrhea and cramping     Your diabetes is under excellent control .  We will repeat your A1c in June  checking your urine today    Fasting sugars should  be < 125   And 2 hr post prandial ( after eating) should be < 160

## 2017-06-29 DIAGNOSIS — R82998 Other abnormal findings in urine: Secondary | ICD-10-CM | POA: Insufficient documentation

## 2017-06-29 DIAGNOSIS — M791 Myalgia, unspecified site: Secondary | ICD-10-CM | POA: Insufficient documentation

## 2017-06-29 DIAGNOSIS — T466X5A Adverse effect of antihyperlipidemic and antiarteriosclerotic drugs, initial encounter: Secondary | ICD-10-CM | POA: Insufficient documentation

## 2017-06-29 DIAGNOSIS — F5102 Adjustment insomnia: Secondary | ICD-10-CM | POA: Insufficient documentation

## 2017-06-29 DIAGNOSIS — R252 Cramp and spasm: Secondary | ICD-10-CM | POA: Insufficient documentation

## 2017-06-29 NOTE — Assessment & Plan Note (Signed)
Recommend trial of melatonin.

## 2017-06-29 NOTE — Assessment & Plan Note (Signed)
Affecting fingers and toes,  No major muscle groups.  Encouraged to continue statin,  Try turmeric and magnesium supplements.

## 2017-06-29 NOTE — Assessment & Plan Note (Signed)
Home readings are mostly < 140/80 and she is intolerant of more aggressive therapy.  May try toprol in the future if palpitations become more bothersome

## 2017-06-29 NOTE — Assessment & Plan Note (Signed)
Ua is again normal and she has no microalbuminuria

## 2017-06-29 NOTE — Assessment & Plan Note (Signed)
Remains well-controlled on diet alone. Intolerant of ACE I due to cough.  No proteinuria.  Foot exam normal today. Statin therapy advised,  She is  cautiously tolerating atorvastatin every other day . reducing aspirin to once weekly due to nosebleeds .   Lab Results  Component Value Date   HGBA1C 6.7 (H) 04/15/2017   Lab Results  Component Value Date   MICROALBUR <0.7 06/28/2017     Lab Results  Component Value Date   CHOL 179 04/15/2017   HDL 84.00 04/15/2017   LDLCALC 80 04/15/2017   LDLDIRECT 151.5 10/03/2013   TRIG 79.0 04/15/2017   CHOLHDL 2 04/15/2017

## 2017-06-30 ENCOUNTER — Other Ambulatory Visit (INDEPENDENT_AMBULATORY_CARE_PROVIDER_SITE_OTHER): Payer: Medicare Other

## 2017-06-30 DIAGNOSIS — I10 Essential (primary) hypertension: Secondary | ICD-10-CM

## 2017-06-30 DIAGNOSIS — C50811 Malignant neoplasm of overlapping sites of right female breast: Secondary | ICD-10-CM | POA: Diagnosis not present

## 2017-06-30 LAB — BASIC METABOLIC PANEL
BUN: 10 mg/dL (ref 6–23)
CHLORIDE: 107 meq/L (ref 96–112)
CO2: 25 meq/L (ref 19–32)
Calcium: 8.8 mg/dL (ref 8.4–10.5)
Creatinine, Ser: 0.62 mg/dL (ref 0.40–1.20)
GFR: 99.42 mL/min (ref >60.00)
GLUCOSE: 113 mg/dL — AB (ref 70–99)
Potassium: 3.9 mEq/L (ref 3.5–5.1)
SODIUM: 139 meq/L (ref 135–145)

## 2017-07-01 ENCOUNTER — Encounter: Payer: Self-pay | Admitting: General Surgery

## 2017-07-06 ENCOUNTER — Telehealth: Payer: Self-pay

## 2017-07-06 ENCOUNTER — Other Ambulatory Visit: Payer: Self-pay | Admitting: Internal Medicine

## 2017-07-06 NOTE — Telephone Encounter (Signed)
Copied from [place] [id number]. Topic: General - Call Back - No Documentation >> Jul 06, 2017  8:52 AM Arletha Grippe wrote: Reason for CRM: pt returning jessica's call - no crm  Please call [phone]

## 2017-07-07 NOTE — Telephone Encounter (Signed)
Left detailed message to let pt know that the only reason we called the other day was to let her know that we had faxed her lorazepam rx to the pharmacy.

## 2017-07-07 NOTE — Telephone Encounter (Signed)
Refilled: 05/28/2017 Last OV: 06/28/2017 Next OV: 07/16/2017

## 2017-07-08 NOTE — Telephone Encounter (Signed)
Printed, signed and faxed.  

## 2017-07-16 ENCOUNTER — Ambulatory Visit: Payer: Medicare Other | Admitting: Internal Medicine

## 2017-08-12 ENCOUNTER — Other Ambulatory Visit: Payer: Self-pay

## 2017-08-12 DIAGNOSIS — C50811 Malignant neoplasm of overlapping sites of right female breast: Secondary | ICD-10-CM

## 2017-08-12 DIAGNOSIS — Z170 Estrogen receptor positive status [ER+]: Principal | ICD-10-CM

## 2017-08-15 ENCOUNTER — Other Ambulatory Visit: Payer: Self-pay | Admitting: Oncology

## 2017-08-16 ENCOUNTER — Other Ambulatory Visit: Payer: Self-pay | Admitting: *Deleted

## 2017-08-16 NOTE — Telephone Encounter (Signed)
This is already filled 4/14

## 2017-08-17 DIAGNOSIS — K5080 Crohn's disease of both small and large intestine without complications: Secondary | ICD-10-CM | POA: Diagnosis not present

## 2017-08-22 NOTE — Progress Notes (Signed)
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  INTERVAL HISTORY: Patient returns to clinic today for routine six-month evaluation.  She continues to tolerate letrozole well without significant side effects.  She currently feels well and is asymptomatic. She has no neurologic complaints.  She denies any fevers or recent illnesses.  She has a good appetite and denies weight loss.  She denies any chest pain or shortness of breath.  She has no nausea, vomiting, constipation, or diarrhea.  She has no urinary complaints.  Patient feels at her baseline offers no specific complaints today.  REVIEW OF SYSTEMS:   Review of Systems  Constitutional: Negative.  Negative for fever, malaise/fatigue and weight loss.  Respiratory: Negative.  Negative for cough and shortness of breath.   Cardiovascular: Negative.  Negative for chest pain and leg swelling.  Gastrointestinal: Negative.  Negative for abdominal pain.  Genitourinary: Negative.  Negative for dysuria.  Musculoskeletal: Negative.  Negative for back pain.  Skin: Negative.  Negative for rash.  Neurological: Negative.  Negative for sensory change, focal weakness and weakness.  Psychiatric/Behavioral: Negative.  The patient is not nervous/anxious and does not have insomnia.    As per HPI. Otherwise, a complete review of systems is negative.  PAST MEDICAL HISTORY: Past Medical History:  Diagnosis Date  . Arthritis   . Breast cancer, right breast ([place]) 08/24/2012   Histologic grade 1, invasive mammary carcinoma, no specific type. 1.6 cm node negative, ER/PR positive, HER-2/neu not  overexpressing tumor resected May 2014.  . Crohn's disease ([place])   . GERD (gastroesophageal reflux disease)   . History of blood transfusion 1960  . Hypertension   . Personal history of radiation therapy     PAST SURGICAL HISTORY: Past Surgical History:  Procedure Laterality Date  . BRAIN SURGERY  1980   breast biopsies, benign  . BREAST BIOPSY Right 2014   +  . BREAST BIOPSY Bilateral    neg  . BREAST LUMPECTOMY Right May 2014   T1c, N0; ER/PR positive, HER-2/neu not overexpressing.  Marland Kitchen BREAST SURGERY  1980's   fibro cyst both breast in [place]  . COLONOSCOPY  2017  . DILATION AND CURETTAGE OF UTERUS      FAMILY HISTORY Family History  Problem Relation Age of Onset  . Cancer Paternal Grandmother   . Heart disease Sister   . Diabetes Sister   . Breast cancer Neg Hx        ADVANCED DIRECTIVES:    HEALTH MAINTENANCE: Social History   Tobacco Use  . Smoking status: Never Smoker  . Smokeless tobacco: Never Used  Substance Use Topics  . Alcohol use: Yes    Alcohol/week: 0.6 oz    Types: 1 Glasses of wine per week    Comment: OCC  . Drug use: No     Colonoscopy:  PAP:  Bone density:  Lipid panel:  Allergies  Allergen Reactions  . Letrozole Anaphylaxis and Itching  . Amlodipine Itching  . Exemestane Nausea Only  . Hydrochlorothiazide Other (See Comments)  . Lexapro [Escitalopram Oxalate] Other (See Comments)    intolerance  . Lisinopril Cough  . Sulfa Antibiotics Other (See Comments)    Current  Outpatient Medications  Medication Sig Dispense Refill  . amLODipine (NORVASC) 2.5 MG tablet Take 1 tablet (2.5 mg total) by mouth daily. 90 tablet 2  . anastrozole (ARIMIDEX) 1 MG tablet TAKE 1 TABLET BY MOUTH ONCE DAILY 90 tablet 0  . aspirin EC 81 MG tablet Take 81 mg by mouth daily.    Marland Kitchen atorvastatin (LIPITOR) 10 MG tablet Take 1 tablet (10 mg total) by mouth daily. (Patient taking differently: Take 10 mg by mouth every other day. ) 90 tablet 3  .  cholecalciferol (VITAMIN D) 1000 units tablet Take 1,000 Units by mouth daily.    . furosemide (LASIX) 20 MG tablet Take 1 tablet (20 mg total) by mouth daily. As needed for fluid retention 90 tablet 0  . inFLIXimab (REMICADE) 100 MG injection Inject 1 mg into the vein every 8 (eight) weeks.    Marland Kitchen loratadine (CLARITIN) 10 MG tablet Take 1 tablet (10 mg total) by mouth daily. (Patient taking differently: Take 10 mg by mouth daily as needed. ) 30 tablet 5  . LORazepam (ATIVAN) 0.5 MG tablet TAKE 1 TABLET BY MOUTH EVERY 8 HOURS AS NEEDED FOR ANXIETY 60 tablet 3  . Omega-3 Fatty Acids (FISH OIL PO) Take by mouth.    . oseltamivir (TAMIFLU) 75 MG capsule Take 1 capsule (75 mg total) by mouth daily. 10 capsule 0  . polyethylene glycol powder (GLYCOLAX/MIRALAX) powder Take by mouth.    . vitamin E 400 UNIT capsule Take by mouth.     No current facility-administered medications for this visit.     OBJECTIVE: There were no vitals filed for this visit.   There is no height or weight on file to calculate BMI.    ECOG FS:0 - Asymptomatic  General: Well-developed, well-nourished, no acute distress. Eyes: Pink conjunctiva, anicteric sclera. Breast: Bilateral breast and axilla without lumps or masses. Lungs: Clear to auscultation bilaterally. Heart: Regular rate and rhythm. No rubs, murmurs, or gallops. Abdomen: Soft, nontender, nondistended. No organomegaly noted, normoactive bowel sounds. Musculoskeletal: No edema, cyanosis, or clubbing. Neuro: Alert, answering all questions appropriately. Cranial nerves grossly intact. Skin: No rashes or petechiae noted. Psych: Normal affect.   LAB RESULTS:  Lab Results  Component Value Date   NA 139 06/30/2017   K 3.9 06/30/2017   CL 107 06/30/2017   CO2 25 06/30/2017   GLUCOSE 113 (H) 06/30/2017   BUN 10 06/30/2017   CREATININE 0.62 06/30/2017   CALCIUM 8.8 06/30/2017   PROT 7.5 04/15/2017   ALBUMIN 4.2 04/15/2017   AST 16 04/15/2017   ALT 14  04/15/2017   ALKPHOS 71 04/15/2017   BILITOT 0.9 04/15/2017   GFRNONAA >60 12/31/2016   GFRAA >60 12/31/2016    Lab Results  Component Value Date   WBC 5.8 12/31/2016   NEUTROABS 1.9 09/30/2016   HGB 13.5 12/31/2016   HCT 39.7 12/31/2016   MCV 91.1 12/31/2016   PLT 215 12/31/2016     STUDIES: No results found.  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 No Oncotype score available.  PLAN:    1.  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 Continue with anastrozole, completing 5 years of treatment in September of 2019.  Patient's most recent mammogram on Oct 01, 2016 was reported as BI-RADS 2.  Repeat in May 2019. Return to clinic in 6 months for routine  evaluation. 2. Osteopenia: Bone mineral density on March 02, 2017 with a T score of -1.7 which is slightly worse than one year prior when her T score was reported at -1.5. Repeat bone mineral density in October 2019. Continue calcium and vitamin D supplementation.  3. Arthritis: Continue Remicade as prescribed by her rheumatologist.  Approximately 20 minutes spent in discussion of which greater than 50% was consultation.   Patient expressed understanding and was in agreement with this plan. She also understands that She can call clinic at any time with any questions, concerns, or complaints.    Lloyd Huger, MD   08/25/2017 9:11 AM

## 2017-08-23 ENCOUNTER — Encounter: Payer: Self-pay | Admitting: Oncology

## 2017-08-23 ENCOUNTER — Other Ambulatory Visit: Payer: Self-pay

## 2017-08-23 ENCOUNTER — Ambulatory Visit
Admission: RE | Admit: 2017-08-23 | Discharge: 2017-08-23 | Disposition: A | Discharge status: Home / Self Care | Payer: Medicare Other | Source: Ambulatory Visit | Attending: Radiation Oncology | Admitting: Radiation Oncology

## 2017-08-23 ENCOUNTER — Inpatient Hospital Stay: Payer: Medicare Other | Attending: Oncology | Admitting: Oncology

## 2017-08-23 VITALS — BP 138/77 | HR 76 | Temp 98.0°F | Resp 12 | Ht 61.0 in | Wt 142.5 lb

## 2017-08-23 DIAGNOSIS — C50911 Malignant neoplasm of unspecified site of right female breast: Secondary | ICD-10-CM | POA: Insufficient documentation

## 2017-08-23 DIAGNOSIS — Z79899 Other long term (current) drug therapy: Secondary | ICD-10-CM | POA: Insufficient documentation

## 2017-08-23 DIAGNOSIS — K5090 Crohn's disease, unspecified, without complications: Secondary | ICD-10-CM | POA: Insufficient documentation

## 2017-08-23 DIAGNOSIS — Z79811 Long term (current) use of aromatase inhibitors: Secondary | ICD-10-CM | POA: Insufficient documentation

## 2017-08-23 DIAGNOSIS — M8580 Other specified disorders of bone density and structure, unspecified site: Secondary | ICD-10-CM | POA: Diagnosis not present

## 2017-08-23 DIAGNOSIS — Z923 Personal history of irradiation: Secondary | ICD-10-CM | POA: Diagnosis not present

## 2017-08-23 DIAGNOSIS — Z7982 Long term (current) use of aspirin: Secondary | ICD-10-CM | POA: Insufficient documentation

## 2017-08-23 DIAGNOSIS — Z9225 Personal history of immunosupression therapy: Secondary | ICD-10-CM

## 2017-08-23 DIAGNOSIS — K219 Gastro-esophageal reflux disease without esophagitis: Secondary | ICD-10-CM | POA: Diagnosis not present

## 2017-08-23 DIAGNOSIS — Z170 Estrogen receptor positive status [ER+]: Secondary | ICD-10-CM | POA: Insufficient documentation

## 2017-08-23 DIAGNOSIS — Z809 Family history of malignant neoplasm, unspecified: Secondary | ICD-10-CM | POA: Diagnosis not present

## 2017-08-23 DIAGNOSIS — I10 Essential (primary) hypertension: Secondary | ICD-10-CM | POA: Diagnosis not present

## 2017-08-23 NOTE — Progress Notes (Signed)
Radiation Oncology Follow up Note  Name: Kim Sanchez   Date:   08/23/2017 MRN:  [id number] DOB: [birth year]    This 77 y.o. female presents to the clinic today for 4.5 year follow-up status post whole breast radiation to her right breast for stage I ER/PR positive invasive mammary carcinoma.  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 Seen today in routine follow-up she is doing well. She specifically denies breast tenderness cough or bone pain.Marland Kitchenlast mammogram was back in May in 2018 was BI-RADS 2 benign. She's currently arimadex time that well without side effect.  COMPLICATIONS OF TREATMENT: none  FOLLOW UP COMPLIANCE: keeps appointments   PHYSICAL EXAM:  BP 138/77 (BP Location: Left Arm)   Pulse 76   Temp 98 F (36.7 C) (Tympanic)   Resp 12   Ht 5' 1"  (1.549 m)   Wt 142 lb 8.4 oz (64.7 kg)   BMI 26.93 kg/m  Lungs are clear to A&P cardiac examination essentially unremarkable with regular rate and rhythm. No dominant mass or nodularity is noted in either breast in 2 positions examined. Incision is well-healed. No axillary or supraclavicular adenopathy is appreciated. Cosmetic result is excellent. Well-developed well-nourished patient in NAD. HEENT reveals PERLA, EOMI, discs not visualized.  Oral cavity is clear. No oral mucosal lesions are identified. Neck is clear without evidence of cervical or supraclavicular adenopathy. Lungs are clear to A&P. Cardiac examination is essentially unremarkable with regular rate and rhythm without murmur rub or thrill. Abdomen is benign with no organomegaly or masses noted. Motor sensory and DTR levels are equal and symmetric in the upper and lower extremities. Cranial nerves II through XII are grossly intact. Proprioception is intact. No peripheral adenopathy or edema is identified. No  motor or sensory levels are noted. Crude visual fields are within normal range.  RADIOLOGY RESULTS: no current films for review  PLAN: present time patient is doing well with no evidence of disease. She's close to 5 years out I am going to discontinue follow-up care. She or he has follow-up mammogram scheduled in May. Patient knows to call anytime with any concerns.  I would like to take this opportunity to thank you for allowing me to participate in the care of your patient.Noreene Filbert, MD

## 2017-08-23 NOTE — Progress Notes (Signed)
Patient here for follow up. No changes.

## 2017-09-01 ENCOUNTER — Telehealth: Payer: Self-pay

## 2017-09-01 NOTE — Telephone Encounter (Signed)
Copied from [place] [phone]. Topic: Inquiry >> Sep 01, 2017  3:25 PM Oliver Pila B wrote: Reason for CRM: pt called to ask to have a lab order created for her so that she can go in for labs a week before for Dr. Lupita Dawn visit of 6.24.19, call pt to advise

## 2017-09-02 ENCOUNTER — Ambulatory Visit: Payer: Medicare Other | Admitting: Oncology

## 2017-09-03 ENCOUNTER — Ambulatory Visit: Payer: Medicare Other | Admitting: Family

## 2017-09-03 ENCOUNTER — Encounter

## 2017-09-07 ENCOUNTER — Telehealth: Payer: Self-pay

## 2017-09-07 DIAGNOSIS — I10 Essential (primary) hypertension: Secondary | ICD-10-CM

## 2017-09-07 DIAGNOSIS — E119 Type 2 diabetes mellitus without complications: Secondary | ICD-10-CM

## 2017-09-07 DIAGNOSIS — E785 Hyperlipidemia, unspecified: Secondary | ICD-10-CM

## 2017-09-07 NOTE — Telephone Encounter (Signed)
Ordered cmp, lipid, and A1c. Is there anything else that needs to be ordered. Pt has an appt with you on 10/25/2017 and would like to get lab work done prior to office visit.

## 2017-09-07 NOTE — Telephone Encounter (Signed)
Lab Results  Component Value Date   MICROALBUR <0.7 06/28/2017  no she is up to date on urine test.  Thank you!

## 2017-09-07 NOTE — Telephone Encounter (Signed)
LMTCB. Need to schedule pt for a fasting lab appt the week before her appt on 10/25/2017. Labs have been ordered.

## 2017-09-08 NOTE — Telephone Encounter (Signed)
Spoke with pt and she has a lab appt scheduled for 10/18/2017.

## 2017-10-05 ENCOUNTER — Ambulatory Visit
Admission: RE | Admit: 2017-10-05 | Discharge: 2017-10-05 | Disposition: A | Discharge status: Home / Self Care | Payer: Medicare Other | Source: Ambulatory Visit | Attending: General Surgery | Admitting: General Surgery

## 2017-10-05 DIAGNOSIS — Z170 Estrogen receptor positive status [ER+]: Secondary | ICD-10-CM

## 2017-10-05 DIAGNOSIS — C50811 Malignant neoplasm of overlapping sites of right female breast: Secondary | ICD-10-CM | POA: Diagnosis not present

## 2017-10-05 DIAGNOSIS — R921 Mammographic calcification found on diagnostic imaging of breast: Secondary | ICD-10-CM | POA: Diagnosis not present

## 2017-10-06 DIAGNOSIS — I10 Essential (primary) hypertension: Secondary | ICD-10-CM | POA: Diagnosis not present

## 2017-10-06 DIAGNOSIS — I272 Pulmonary hypertension, unspecified: Secondary | ICD-10-CM | POA: Diagnosis not present

## 2017-10-06 DIAGNOSIS — R0789 Other chest pain: Secondary | ICD-10-CM | POA: Diagnosis not present

## 2017-10-06 DIAGNOSIS — Z23 Encounter for immunization: Secondary | ICD-10-CM | POA: Diagnosis not present

## 2017-10-08 DIAGNOSIS — K5080 Crohn's disease of both small and large intestine without complications: Secondary | ICD-10-CM | POA: Diagnosis not present

## 2017-10-15 ENCOUNTER — Other Ambulatory Visit (INDEPENDENT_AMBULATORY_CARE_PROVIDER_SITE_OTHER): Payer: Medicare Other

## 2017-10-15 ENCOUNTER — Telehealth: Payer: Self-pay | Admitting: *Deleted

## 2017-10-15 DIAGNOSIS — I10 Essential (primary) hypertension: Secondary | ICD-10-CM | POA: Diagnosis not present

## 2017-10-15 DIAGNOSIS — E119 Type 2 diabetes mellitus without complications: Secondary | ICD-10-CM

## 2017-10-15 DIAGNOSIS — E785 Hyperlipidemia, unspecified: Secondary | ICD-10-CM | POA: Diagnosis not present

## 2017-10-15 LAB — COMPREHENSIVE METABOLIC PANEL
ALBUMIN: 4 g/dL (ref 3.5–5.2)
ALK PHOS: 76 U/L (ref 39–117)
ALT: 15 U/L (ref 0–35)
AST: 18 U/L (ref 0–37)
BILIRUBIN TOTAL: 0.7 mg/dL (ref 0.2–1.2)
BUN: 15 mg/dL (ref 6–23)
CALCIUM: 9.7 mg/dL (ref 8.4–10.5)
CO2: 30 meq/L (ref 19–32)
CREATININE: 0.68 mg/dL (ref 0.40–1.20)
Chloride: 102 mEq/L (ref 96–112)
GFR: 89.3 mL/min (ref >60.00)
Glucose, Bld: 122 mg/dL — ABNORMAL HIGH (ref 70–99)
Potassium: 3.8 mEq/L (ref 3.5–5.1)
Sodium: 140 mEq/L (ref 135–145)
TOTAL PROTEIN: 7.3 g/dL (ref 6.0–8.3)

## 2017-10-15 LAB — LIPID PANEL
CHOLESTEROL: 216 mg/dL — AB (ref 0–200)
HDL: 76.6 mg/dL (ref >39.00)
LDL Cholesterol: 123 mg/dL — ABNORMAL HIGH (ref 0–99)
NonHDL: 139.81
TRIGLYCERIDES: 83 mg/dL (ref 0.0–149.0)
Total CHOL/HDL Ratio: 3
VLDL: 16.6 mg/dL (ref 0.0–40.0)

## 2017-10-15 LAB — HEMOGLOBIN A1C: Hgb A1c MFr Bld: 6.6 % — ABNORMAL HIGH (ref 4.6–6.5)

## 2017-10-15 NOTE — Telephone Encounter (Signed)
She does NOT NEED URINE RECHECKED.

## 2017-10-15 NOTE — Telephone Encounter (Signed)
Thanks, I will dispose her urine

## 2017-10-15 NOTE — Telephone Encounter (Signed)
Pt came in for fasting labs & asked if she could also leave a urine specimen since she has started on cholesterol & BP medications. Pt denies any urinary sx's other than noticing some foam when she urinate & she wanted to be sure that she is not spilling over any protein in her urine. Her last microalbumin was on 06/28/17.  Note: I informed pt that she can leave a urine & I will send you a message but couldn't guarantee that we will run anything.

## 2017-10-18 ENCOUNTER — Other Ambulatory Visit: Payer: Medicare Other

## 2017-10-19 ENCOUNTER — Ambulatory Visit: Payer: Medicare Other | Admitting: General Surgery

## 2017-10-21 DIAGNOSIS — L821 Other seborrheic keratosis: Secondary | ICD-10-CM | POA: Diagnosis not present

## 2017-10-25 ENCOUNTER — Ambulatory Visit (INDEPENDENT_AMBULATORY_CARE_PROVIDER_SITE_OTHER): Payer: Medicare Other | Admitting: Internal Medicine

## 2017-10-25 ENCOUNTER — Encounter: Payer: Self-pay | Admitting: Internal Medicine

## 2017-10-25 VITALS — BP 146/94 | HR 79 | Temp 98.2°F | Resp 15 | Ht 61.0 in | Wt 143.4 lb

## 2017-10-25 DIAGNOSIS — R635 Abnormal weight gain: Secondary | ICD-10-CM

## 2017-10-25 DIAGNOSIS — F411 Generalized anxiety disorder: Secondary | ICD-10-CM

## 2017-10-25 DIAGNOSIS — I10 Essential (primary) hypertension: Secondary | ICD-10-CM | POA: Diagnosis not present

## 2017-10-25 DIAGNOSIS — E119 Type 2 diabetes mellitus without complications: Secondary | ICD-10-CM

## 2017-10-25 DIAGNOSIS — R82998 Other abnormal findings in urine: Secondary | ICD-10-CM

## 2017-10-25 NOTE — Assessment & Plan Note (Signed)
Her urine has been repeatedly negative for proteinuria

## 2017-10-25 NOTE — Patient Instructions (Addendum)
You have gained weight since your last visit  You should be exercising for a minimum of 30 minutes every day!  This will improve your blood pressure and your diabetes control as well  If home bp readings ARE > 140/80 AFTER THIS WEEK,  LET ME KNOW

## 2017-10-25 NOTE — Assessment & Plan Note (Signed)
I have addressed  Weight gain  And strongly recommended that she engage in regular exercise a minimum of 5 days per week.

## 2017-10-25 NOTE — Assessment & Plan Note (Signed)
She has resumed prn use of lorazepam after unsuccessful trial of lexapro

## 2017-10-25 NOTE — Assessment & Plan Note (Signed)
Remains well-controlled on diet alone. Intolerant of ACE I due to cough.  No proteinuria.  Foot exam normal today. Statin therapy advised,  But she dd not tolerate statin every other day due to involuntary muscle twitching of left hand . She is tolerating every other day Zetia thus far. . Reduced  aspirin to once weekly due to nosebleeds .   Lab Results  Component Value Date   HGBA1C 6.6 (H) 10/15/2017   Lab Results  Component Value Date   MICROALBUR <0.7 06/28/2017     Lab Results  Component Value Date   CHOL 216 (H) 10/15/2017   HDL 76.60 10/15/2017   LDLCALC 123 (H) 10/15/2017   LDLDIRECT 151.5 10/03/2013   TRIG 83.0 10/15/2017   CHOLHDL 3 10/15/2017

## 2017-10-25 NOTE — Progress Notes (Signed)
Subjective:  Patient ID: Kim Sanchez, female    DOB: 1941/04/07  Age: 77 y.o. MRN: [id number]  CC: The primary encounter diagnosis was Diabetes mellitus with no complication ([place]). Diagnoses of Weight gain, Foamy urine, Generalized anxiety disorder, and Essential hypertension were also pertinent to this visit.  HPI Kim Sanchez presents for 4 month follow up on diabetes.  Patient has no complaints today.  Patient is following a low glycemic index diet and taking all prescribed medications regularly without side effects.  Fasting sugars have been under less than 140 most of the time and post prandials have been under 160 except on rare occasions. Patient is not exercising because she has been too busy taking care of her husband, and is dismayed because she has gained weight .  Patient has had an eye exam in the last 12 months and checks feet regularly for signs of infection.  Patient does not walk barefoot outside,  And denies any numbness tingling or burning in feet. Patient is up to date on all recommended vaccinations.  Medication changes by her cardiologist reviewed:  AMLODIPINE and atorvastatin stopped by cardiology due to bilateral hand , eyelid and ankle swelling,  inadequate control of blood pressure and statin intolerance manifested as Involuntary muscle twitching affecting only the left hand .    She has been taking zetia every other day and olmesartan 20/.12.5  started,  Taking full  dose.   And amlodipine stopped.  The hand pain caused by  opening jars has improved since stopped the statin.   Normal functional study; mitral regurgitation and pulmonary hypertension noted   Home bp was 135/65 this morning     Lab Results  Component Value Date   HGBA1C 6.6 (H) 10/15/2017   Lab Results  Component Value Date   MICROALBUR <0.7 06/28/2017     Outpatient Medications Prior to Visit  Medication Sig Dispense Refill  . anastrozole (ARIMIDEX) 1 MG tablet TAKE 1 TABLET BY MOUTH ONCE  DAILY 90 tablet 0  . aspirin EC 81 MG tablet Take 81 mg by mouth daily.    . cholecalciferol (VITAMIN D) 1000 units tablet Take 1,000 Units by mouth daily.    . furosemide (LASIX) 20 MG tablet Take 1 tablet (20 mg total) by mouth daily. As needed for fluid retention 90 tablet 0  . inFLIXimab (REMICADE) 100 MG injection Inject 1 mg into the vein every 8 (eight) weeks.    Marland Kitchen loratadine (CLARITIN) 10 MG tablet Take 1 tablet (10 mg total) by mouth daily. (Patient taking differently: Take 10 mg by mouth daily as needed. ) 30 tablet 5  . LORazepam (ATIVAN) 0.5 MG tablet TAKE 1 TABLET BY MOUTH EVERY 8 HOURS AS NEEDED FOR ANXIETY 60 tablet 3  . Omega-3 Fatty Acids (FISH OIL PO) Take by mouth.    . polyethylene glycol powder (GLYCOLAX/MIRALAX) powder Take by mouth.    . vitamin E 400 UNIT capsule Take by mouth.    . ezetimibe (ZETIA) 10 MG tablet Take 10 mg by mouth daily.  11  . olmesartan-hydrochlorothiazide (BENICAR HCT) 20-12.5 MG tablet Take 1 tablet by mouth daily.  0  . amLODipine (NORVASC) 2.5 MG tablet Take 1 tablet (2.5 mg total) by mouth daily. (Patient not taking: Reported on 10/25/2017) 90 tablet 2  . atorvastatin (LIPITOR) 10 MG tablet Take 1 tablet (10 mg total) by mouth daily. (Patient not taking: Reported on 10/25/2017) 90 tablet 3  . oseltamivir (TAMIFLU) 75 MG capsule Take 1  capsule (75 mg total) by mouth daily. (Patient not taking: Reported on 10/25/2017) 10 capsule 0   No facility-administered medications prior to visit.     Review of Systems;  Patient denies headache, fevers, malaise, unintentional weight loss, skin rash, eye pain, sinus congestion and sinus pain, sore throat, dysphagia,  hemoptysis , cough, dyspnea, wheezing, chest pain, palpitations, orthopnea, edema, abdominal pain, nausea, melena, diarrhea, constipation, flank pain, dysuria, hematuria, urinary  Frequency, nocturia, numbness, tingling, seizures,  Focal weakness, Loss of consciousness,  Tremor, insomnia, depression,  anxiety, and suicidal ideation.      Objective:  BP (!) 146/94 (BP Location: Left Arm, Patient Position: Sitting, Cuff Size: Normal)   Pulse 79   Temp 98.2 F (36.8 C) (Oral)   Resp 15   Ht 5' 1"  (1.549 m)   Wt 143 lb 6.4 oz (65 kg)   SpO2 96%   BMI 27.10 kg/m   BP Readings from Last 3 Encounters:  10/25/17 (!) 146/94  08/23/17 138/77  06/28/17 (!) 150/80    Wt Readings from Last 3 Encounters:  10/25/17 143 lb 6.4 oz (65 kg)  08/23/17 142 lb 8.4 oz (64.7 kg)  06/28/17 138 lb 12.8 oz (63 kg)    General appearance: alert, cooperative and appears stated age Ears: normal TM's and external ear canals both ears Throat: lips, mucosa, and tongue normal; teeth and gums normal Neck: no adenopathy, no carotid bruit, supple, symmetrical, trachea midline and thyroid not enlarged, symmetric, no tenderness/mass/nodules Back: symmetric, no curvature. ROM normal. No CVA tenderness. Lungs: clear to auscultation bilaterally Heart: regular rate and rhythm, S1, S2 normal, no murmur, click, rub or gallop Abdomen: soft, non-tender; bowel sounds normal; no masses,  no organomegaly Pulses: 2+ and symmetric Skin: Skin color, texture, turgor normal. No rashes or lesions MSK: normal strength and tone in upper extremities bilaterally.  Lymph nodes: Cervical, supraclavicular, and axillary nodes normal.  Lab Results  Component Value Date   HGBA1C 6.6 (H) 10/15/2017   HGBA1C 6.7 (H) 04/15/2017   HGBA1C 6.4 01/05/2017    Lab Results  Component Value Date   CREATININE 0.68 10/15/2017   CREATININE 0.62 06/30/2017   CREATININE 0.69 04/15/2017    Lab Results  Component Value Date   WBC 5.8 12/31/2016   HGB 13.5 12/31/2016   HCT 39.7 12/31/2016   PLT 215 12/31/2016   GLUCOSE 122 (H) 10/15/2017   CHOL 216 (H) 10/15/2017   TRIG 83.0 10/15/2017   HDL 76.60 10/15/2017   LDLDIRECT 151.5 10/03/2013   LDLCALC 123 (H) 10/15/2017   ALT 15 10/15/2017   AST 18 10/15/2017   NA 140 10/15/2017   K  3.8 10/15/2017   CL 102 10/15/2017   CREATININE 0.68 10/15/2017   BUN 15 10/15/2017   CO2 30 10/15/2017   TSH 2.39 09/30/2016   HGBA1C 6.6 (H) 10/15/2017   MICROALBUR <0.7 06/28/2017    Mm Diag Breast Tomo Bilateral  Result Date: 10/05/2017 CLINICAL DATA:  RIGHT lumpectomy in 2014. EXAM: DIGITAL DIAGNOSTIC BILATERAL MAMMOGRAM WITH CAD AND TOMO COMPARISON:  10/01/2016 and earlier ACR Breast Density Category c: The breast tissue is heterogeneously dense, which may obscure small masses. FINDINGS: Post operative changes are seen in the [place]. Lucent centered calcifications are identified at the lumpectomy site, consistent with fat necrosis, confirmed on magnified views. No suspicious mass, distortion, or microcalcifications are identified to suggest presence of malignancy. Mammographic images were processed with CAD. IMPRESSION: No mammographic evidence for malignancy. RECOMMENDATION: Screening mammogram in one year.(Code:SM-B-01Y) I  have discussed the findings and recommendations with the patient. Results were also provided in writing at the conclusion of the visit. If applicable, a reminder letter will be sent to the patient regarding the next appointment. BI-RADS CATEGORY  2: Benign. Electronically Signed   By: Nolon Nations M.D.   On: 10/05/2017 10:15    Assessment & Plan:   Problem List Items Addressed This Visit    Weight gain    I have addressed  Weight gain  And strongly recommended that she engage in regular exercise a minimum of 5 days per week.        Hypertension    Not at goal yet with mediation changes which ccurred less than one week ago.  Advised to call if BP is not < 140/80 in one week.       Relevant Medications   ezetimibe (ZETIA) 10 MG tablet   olmesartan-hydrochlorothiazide (BENICAR HCT) 20-12.5 MG tablet   Diabetes mellitus with no complication ([facility]) - Primary    Remains well-controlled on diet alone. Intolerant of ACE I due to cough.  No proteinuria.  Foot  exam normal today. Statin therapy advised,  But she dd not tolerate statin every other day due to involuntary muscle twitching of left hand . She is tolerating every other day Zetia thus far. . Reduced  aspirin to once weekly due to nosebleeds .   Lab Results  Component Value Date   HGBA1C 6.6 (H) 10/15/2017   Lab Results  Component Value Date   MICROALBUR <0.7 06/28/2017     Lab Results  Component Value Date   CHOL 216 (H) 10/15/2017   HDL 76.60 10/15/2017   LDLCALC 123 (H) 10/15/2017   LDLDIRECT 151.5 10/03/2013   TRIG 83.0 10/15/2017   CHOLHDL 3 10/15/2017         Relevant Medications   olmesartan-hydrochlorothiazide (BENICAR HCT) 20-12.5 MG tablet   Other Relevant Orders   Comprehensive metabolic panel   Lipid panel   Microalbumin / creatinine urine ratio   Hemoglobin A1c   Generalized anxiety disorder    She has resumed prn use of lorazepam after unsuccessful trial of lexapro       Foamy urine    Her urine has been repeatedly negative for proteinuria       A total of 25 minutes of face to face time was spent with patient more than half of which was spent in counselling about the above mentioned conditions  and coordination of care   I have discontinued Kim [place] [place] "AGGIE"'s atorvastatin, amLODipine, and oseltamivir. I am also having her maintain her inFLIXimab, aspirin EC, loratadine, Omega-3 Fatty Acids (FISH OIL PO), vitamin E, polyethylene glycol powder, cholecalciferol, furosemide, LORazepam, anastrozole, ezetimibe, and olmesartan-hydrochlorothiazide.  No orders of the defined types were placed in this encounter.   Medications Discontinued During This Encounter  Medication Reason  . amLODipine (NORVASC) 2.5 MG tablet Discontinued by provider  . atorvastatin (LIPITOR) 10 MG tablet Discontinued by provider  . oseltamivir (TAMIFLU) 75 MG capsule Completed Course    Follow-up: Return in about 5 months (around 03/27/2018) for follow up diabetes.   [name], MD

## 2017-10-25 NOTE — Assessment & Plan Note (Addendum)
Not at goal yet with mediation changes which ccurred less than one week ago.  Advised to call if BP is not < 140/80 in one week.

## 2017-10-27 ENCOUNTER — Encounter: Payer: Self-pay | Admitting: Internal Medicine

## 2017-11-02 ENCOUNTER — Encounter: Payer: Self-pay | Admitting: General Surgery

## 2017-11-02 ENCOUNTER — Ambulatory Visit (INDEPENDENT_AMBULATORY_CARE_PROVIDER_SITE_OTHER): Payer: Medicare Other | Admitting: General Surgery

## 2017-11-02 VITALS — BP 162/80 | HR 86 | Resp 15 | Ht 62.0 in | Wt 142.0 lb

## 2017-11-02 DIAGNOSIS — C50811 Malignant neoplasm of overlapping sites of right female breast: Secondary | ICD-10-CM

## 2017-11-02 DIAGNOSIS — Z170 Estrogen receptor positive status [ER+]: Secondary | ICD-10-CM

## 2017-11-02 NOTE — Progress Notes (Signed)
Patient ID: Kim Sanchez, female   DOB: 1940-12-08, 77 y.o.   MRN: [id number]  Chief Complaint  Patient presents with  . Follow-up    mammogram    HPI Kim Sanchez is a 77 y.o. female who presents for a breast evaluation. The most recent mammogram was done on 10/05/2017.  Patient does perform regular self breast checks and gets regular mammograms done.    HPI  Past Medical History:  Diagnosis Date  . Arthritis   . Breast cancer, right breast ([place]) 08/24/2012   Histologic grade 1, invasive mammary carcinoma, no specific type. 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  . Crohn's disease ([place])   . GERD (gastroesophageal reflux disease)   . History of blood transfusion 1960  . Hypertension   . Personal history of radiation therapy     Past Surgical History:  Procedure Laterality Date  . BRAIN SURGERY  1980   breast biopsies, benign  . 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 BREAST BIOPSY Bilateral    - core Bx- neg  . BREAST LUMPECTOMY Right May 2014   T1c, N0; ER/PR positive, HER-2/neu not overexpressing.  Marland Kitchen BREAST SURGERY  1980's   fibro cyst both breast in [place]  . COLONOSCOPY  2017  . DILATION AND CURETTAGE OF UTERUS      Family History  Problem Relation Age of Onset  . Cancer Paternal Grandmother   . Heart disease Sister   . Diabetes Sister   . Breast cancer Neg Hx     Social History Social History   Tobacco Use  . Smoking status: Never Smoker  . Smokeless tobacco: Never Used  Substance Use Topics  . Alcohol use: Yes    Alcohol/week: 0.6 oz    Types: 1 Glasses of wine per week    Comment: OCC  . Drug use: No    Allergies  Allergen Reactions  . Letrozole Anaphylaxis and Itching  . Amlodipine Itching  . Exemestane Nausea Only  . Hydrochlorothiazide Other (See Comments)  . Lexapro [Escitalopram Oxalate] Other (See Comments)    intolerance  . Lisinopril Cough  . Sulfa Antibiotics  Other (See Comments)    Current Outpatient Medications  Medication Sig Dispense Refill  . anastrozole (ARIMIDEX) 1 MG tablet TAKE 1 TABLET BY MOUTH ONCE DAILY 90 tablet 0  . aspirin EC 81 MG tablet Take 81 mg by mouth daily.    . cholecalciferol (VITAMIN D) 1000 units tablet Take 1,000 Units by mouth daily.    . furosemide (LASIX) 20 MG tablet Take 1 tablet (20 mg total) by mouth daily. As needed for fluid retention 90 tablet 0  . inFLIXimab (REMICADE) 100 MG injection Inject 1 mg into the vein every 8 (eight) weeks.    Marland Kitchen LORazepam (ATIVAN) 0.5 MG tablet TAKE 1 TABLET BY MOUTH EVERY 8 HOURS AS NEEDED FOR ANXIETY 60 tablet 3  . olmesartan-hydrochlorothiazide (BENICAR HCT) 20-12.5 MG tablet Take 1 tablet by mouth daily.  0  . Omega-3 Fatty Acids (FISH OIL PO) Take by mouth.    . polyethylene glycol powder (GLYCOLAX/MIRALAX) powder Take by mouth.    . vitamin E 400 UNIT capsule Take by mouth.     No current facility-administered medications for this visit.     Review of Systems Review of Systems  Constitutional: Negative.   Respiratory: Negative.   Cardiovascular: Negative.     Blood pressure (!) 162/80, pulse 86, resp. rate  15, height 5' 2"  (1.575 m), weight 142 lb (64.4 kg).  Physical Exam Physical Exam  Constitutional: She is oriented to person, place, and time. She appears well-developed and well-nourished.  Eyes: Conjunctivae are normal. No scleral icterus.  Neck: Neck supple.  Cardiovascular: Normal rate and regular rhythm.  Murmur heard.  Systolic murmur is present with a grade of 2/6. Pulmonary/Chest: Effort normal and breath sounds normal. Right breast exhibits no inverted nipple, no mass, no nipple discharge, no skin change and no tenderness. Left breast exhibits no inverted nipple, no mass, no nipple discharge, no skin change and no tenderness.    Lymphadenopathy:    She has no cervical adenopathy.    She has no axillary adenopathy.  Neurological: She is alert and  oriented to person, place, and time.  Skin: Skin is warm and dry.    Data Reviewed Bilateral diagnostic mammograms dated October 05, 2017 were reviewed.  Postsurgical changes.  Recommendation for screening exam next year.  BI-RADS-2.  Assessment    Benign breast exam.  Increased home responsibilities with her husband and brother-in-law both requiring her attention.    Plan  Patient was encouraged to make use of other family members and her children to minimize being overwhelmed.  She seems to be bearing up pretty well, and its anticipated her brother-in-law will be able to return home soon.  Patient will be asked to return to the office in one year with a bilateral screening mammogram. The patient is aware to call back for any questions or concerns.   HPI, Physical Exam, Assessment and Plan have been scribed under the direction and in the presence of Hervey Ard, MD.  Gaspar Cola, CMA  I have completed the exam and reviewed the above documentation for accuracy and completeness.  I agree with the above.  Haematologist has been used and any errors in dictation or transcription are unintentional.  Hervey Ard, M.D., F.A.C.S.  [facility] 11/03/2017, 12:57 PM

## 2017-11-02 NOTE — Patient Instructions (Addendum)
Patient will be asked to return to the office in one year with a bilateral screening mammogram. The patient is aware to call back for any questions or concerns. 

## 2017-11-12 ENCOUNTER — Other Ambulatory Visit: Payer: Self-pay | Admitting: Internal Medicine

## 2017-11-12 NOTE — Telephone Encounter (Signed)
Last Ov 10/25/17 last filled 07/07/17 60 [id number]

## 2017-11-13 ENCOUNTER — Other Ambulatory Visit: Payer: Self-pay | Admitting: Internal Medicine

## 2017-11-15 ENCOUNTER — Telehealth: Payer: Self-pay | Admitting: *Deleted

## 2017-11-15 ENCOUNTER — Other Ambulatory Visit: Payer: Self-pay | Admitting: Oncology

## 2017-11-15 ENCOUNTER — Other Ambulatory Visit: Payer: Self-pay | Admitting: Internal Medicine

## 2017-11-15 ENCOUNTER — Encounter: Payer: Self-pay | Admitting: Internal Medicine

## 2017-11-15 MED ORDER — ANASTROZOLE 1 MG PO TABS: 1.0000 mg | ORAL_TABLET | Freq: Every day | ORAL | 0 refills | Status: AC

## 2017-11-15 NOTE — Telephone Encounter (Signed)
Patient called requesting refill for anastrozole. Patient seen in clinic on 08/23/17. Medication refilled.

## 2017-11-16 ENCOUNTER — Encounter: Payer: Self-pay | Admitting: Family Medicine

## 2017-11-16 ENCOUNTER — Ambulatory Visit (INDEPENDENT_AMBULATORY_CARE_PROVIDER_SITE_OTHER): Payer: Medicare Other | Admitting: Family Medicine

## 2017-11-16 ENCOUNTER — Ambulatory Visit: Payer: Self-pay | Admitting: *Deleted

## 2017-11-16 VITALS — BP 148/84 | HR 87 | Temp 98.2°F | Wt 142.1 lb

## 2017-11-16 DIAGNOSIS — L299 Pruritus, unspecified: Secondary | ICD-10-CM

## 2017-11-16 DIAGNOSIS — I10 Essential (primary) hypertension: Secondary | ICD-10-CM

## 2017-11-16 MED ORDER — OLMESARTAN MEDOXOMIL 20 MG PO TABS: 20.0000 mg | ORAL_TABLET | Freq: Every day | ORAL | 1 refills | Status: DC

## 2017-11-16 NOTE — Progress Notes (Signed)
Subjective:    Patient ID: Kim Sanchez, female    DOB: 1940/08/06, 77 y.o.   MRN: [id number]  HPI  Kim Sanchez is a 77 year old female who presents today with itching/tingling on arms/legs/ and scalp, dry mouth, and mild fatigue for 5 days. She states that she experiences itching that can be in different areas and this does not occur "all the time." Itching can occurs in phases and lasts seconds and then goes away. She has taken one Benedryl that provide benefit. She also reports that itching goes away in "a short time" even if she receives no treatment. She takes lorazepam BID that has provided benefit.  She was recently started on olmesartan/HCTZ by cardiology.  She is followed by cardiology and has had trouble with BP medications. She stopped losartan and valsartan due to recall. Amlodipine was not controlling her BP and causing edema so this was discontinued. She also had a problem with lisinopril and her cardiology provided noted that she has successfully taking sulfa containing diuretics in the past without difficulty.  On 10/06/17 she was switched from amlodipine to telmisartan/HCTZ 40/12.5 daily. This medication was expensive so she was switched to olmesartan/HCTZ 20/12.5 daily. She has been taking this medication daily with the exception of today.  She has stopped olmesartan/HCTZ today as she is concerned that her symptoms may be coming from a medication reaction. Yesterday her BP before medication was 151/75, she took medication then BP was rechecked and noted to be 124/67. She noted tingling and itching and this was concerning for her so she did not take this medication today.   She denies chest pain, palpitations, SOB, numbness, weakness, headaches, SOB, throat tightening, tongue swelling, difficulty swallowing, hoarseness, wheezing, rash, or edema.  She states that she is not currently itching during exam today.  Review of Systems  Constitutional: Negative for chills and fever.    Mild fatigue  Respiratory: Negative for cough, shortness of breath and wheezing.   Cardiovascular: Negative for chest pain and palpitations.  Gastrointestinal: Negative for abdominal pain, diarrhea, nausea and vomiting.  Musculoskeletal: Negative for myalgias.  Skin: Negative for rash.       Itching of arms/legs/scalp  Neurological: Negative for dizziness, light-headedness, numbness and headaches.  Psychiatric/Behavioral:       Denies depressed mood today. Reports anxiety that occurs at times   Past Medical History:  Diagnosis Date  . Arthritis   . 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  . Crohn's disease ([place])   . GERD (gastroesophageal reflux disease)   . History of blood transfusion 1960  . Hypertension   . Personal history of radiation therapy      Social History   Socioeconomic History  . Marital status: Married    Spouse name: Not on file  . Number of children: Not on file  . Years of education: Not on file  . Highest education level: Not on file  Occupational History  . Not on file  Social Needs  . Financial resource strain: Not on file  . Food insecurity:    Worry: Not on file    Inability: Not on file  . Transportation needs:    Medical: Not on file    Non-medical: Not on file  Tobacco Use  . Smoking status: Never Smoker  . Smokeless tobacco: Never Used  Substance and Sexual  Activity  . Alcohol use: Yes    Alcohol/week: 0.6 oz    Types: 1 Glasses of wine per week    Comment: OCC  . Drug use: No  . Sexual activity: Not Currently  Lifestyle  . Physical activity:    Days per week: Not on file    Minutes per session: Not on file  . Stress: Not on file  Relationships  . Social connections:    Talks on phone: Not on file    Gets together: Not on file    Attends religious service: Not on file     Active member of club or organization: Not on file    Attends meetings of clubs or organizations: Not on file    Relationship status: Not on file  . Intimate partner violence:    Fear of current or ex partner: Not on file    Emotionally abused: Not on file    Physically abused: Not on file    Forced sexual activity: Not on file  Other Topics Concern  . Not on file  Social History Narrative  . Not on file    Past Surgical History:  Procedure Laterality Date  . BRAIN SURGERY  1980   breast biopsies, benign  . 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 BREAST BIOPSY Bilateral    - core Bx- neg  . BREAST LUMPECTOMY Right May 2014   T1c, N0; ER/PR positive, HER-2/neu not overexpressing.  Marland Kitchen BREAST SURGERY  1980's   fibro cyst both breast in [place]  . COLONOSCOPY  2017  . DILATION AND CURETTAGE OF UTERUS      Family History  Problem Relation Age of Onset  . Cancer Paternal Grandmother   . Heart disease Sister   . Diabetes Sister   . Breast cancer Neg Hx     Allergies  Allergen Reactions  . Letrozole Anaphylaxis and Itching  . Amlodipine Itching  . Exemestane Nausea Only  . Hydrochlorothiazide Other (See Comments)  . Lexapro [Escitalopram Oxalate] Other (See Comments)    intolerance  . Lisinopril Cough  . Sulfa Antibiotics Other (See Comments)    Current Outpatient Medications on File Prior to Visit  Medication Sig Dispense Refill  . anastrozole (ARIMIDEX) 1 MG tablet Take 1 tablet (1 mg total) by mouth daily. 90 tablet 0  . aspirin EC 81 MG tablet Take 81 mg by mouth daily.    . calcium carbonate (TUMS - DOSED IN MG ELEMENTAL CALCIUM) 500 MG chewable tablet Chew 1 tablet by mouth daily.    . cholecalciferol (VITAMIN D) 1000 units tablet Take 1,000 Units by mouth daily.    Marland Kitchen inFLIXimab (REMICADE) 100 MG injection Inject 1 mg into the vein every 8 (eight) weeks.    Marland Kitchen loratadine (CLARITIN) 10 MG tablet Take 10 mg by mouth daily as needed for  allergies.    Marland Kitchen LORazepam (ATIVAN) 0.5 MG tablet TAKE 1 TABLET BY MOUTH EVERY 8 HOURS AS NEEDED FOR ANXIETY *MAXIMUM OF 2 TABLETS A DAY* 60 tablet 3  . olmesartan-hydrochlorothiazide (BENICAR HCT) 20-12.5 MG tablet Take 1 tablet by mouth daily.  0  . Omega-3 Fatty Acids (FISH OIL PO) Take by mouth.    . polyethylene glycol powder (GLYCOLAX/MIRALAX) powder Take by mouth.    . vitamin E 400 UNIT capsule Take by mouth.    . [DISCONTINUED] losartan-hydrochlorothiazide (HYZAAR) 50-12.5 MG tablet Take 1 tablet by mouth daily. 90 tablet 0   No current facility-administered medications on  file prior to visit.     BP (!) 148/84 (BP Location: Left Arm, Patient Position: Sitting, Cuff Size: Normal) Comment: has not take Blood pressure medication  Pulse 87   Temp 98.2 F (36.8 C) (Oral)   Wt 142 lb 2 oz (64.5 kg)   SpO2 98%   BMI 26.00 kg/m       Objective:   Physical Exam  Constitutional: She is oriented to person, place, and time. She appears well-developed and well-nourished.  HENT:  Mouth/Throat: Oropharynx is clear and moist.  No lesions, scaling, or dryness of scalp noted  Eyes: Pupils are equal, round, and reactive to light. No scleral icterus.  Neck: Neck supple.  Cardiovascular: Normal rate, regular rhythm and intact distal pulses.  Pulmonary/Chest: Effort normal and breath sounds normal. She has no wheezes. She has no rales.  Abdominal: Soft. Bowel sounds are normal. There is no tenderness.  Musculoskeletal: She exhibits no edema.  Lymphadenopathy:    She has no cervical adenopathy.  Neurological: She is alert and oriented to person, place, and time.  Skin: Skin is warm and dry. Capillary refill takes less than 2 seconds. No rash noted.  Psychiatric: She has a normal mood and affect. Her behavior is normal. Judgment and thought content normal.  Appears anxious        Assessment & Plan:  1. Essential hypertension History of multiple medication adverse reactions present. It  is unclear if recent addition of olmesartan/HCTZ is source of itching. She also reports anxiety and notes that itching improved with lorazepam; anxiety may be a component of symptoms.  After review of history; she has successfully taken ARBs in the past and HCTZ is noted as an allergy however there is mention in the cardiology note that she has been able to successfully take sulfa containing diuretics. With description of diffuse itching and no rash noted today it is challenging with prior history of adverse medication reactions to determine source. We discussed treatment options including a trial of olmesartan without HCTZ, monitoring symptoms, documenting blood pressure readings, and follow up in 2 weeks for BP recheck or sooner if needed. She is in agreement with plan.  Provided close return precautions in particular symptoms that require immediate medical attention.  She will follow up in 2 weeks for recheck of BP. - olmesartan (BENICAR) 20 MG tablet; Take 1 tablet (20 mg total) by mouth daily.  Dispense: 30 tablet; Refill: 1  2. Itching Advise patient to document when itching occurs, how long it lasts, what improves symptoms, and if rash is noted. It itching occurs after resuming medication, she will let us know and follow up sooner.  I spent 25 minutes with this patient and greater than 50% of the time was spent in face to face counseling regarding medications, adverse reactions, symptoms of allergic reactions, and when to seek immediate medical attention.  Delano Metz, FNP-C

## 2017-11-16 NOTE — Telephone Encounter (Signed)
Pt reports itching, "All over."  Onset Thursday, worsening. States started Benicar 10/14/17; states "I don't tolerate many medications." Last took yesterday am. No rash, hives. Reports more fatigued lately, decreased energy. States hands with intermittent swelling, occasional intermittent "Stomach cramping" and night sweats. Denies any fever, SOB, oral swelling. Does state her tongue feels a little swollen at times, not presently. Reason for Disposition . Taking prescription medication that could cause itching (e.g., codeine/morphine/other opiates, aspirin)    Additional symptoms  Answer Assessment - Initial Assessment Questions 1. DESCRIPTION: "Describe the itching you are having.     Head, arms , back, legs, scalp 2. SEVERITY: "How bad is it?"    - MILD - doesn't interfere with normal activities   - MODERATE - SEVERE: interferes with work, school, sleep, or other activities      moderate 3. SCRATCHING: "Are there any scratch marks? Bleeding?"     no 4. ONSET: "When did this begin?"      Thursday 5. CAUSE: "What do you think is causing the itching?" (ask about swimming pools, pollen, animals, soaps, etc.)     Possibly Benicar, started 10/14/17 6. OTHER SYMPTOMS: "Do you have any other symptoms?"      Intermittent hands swelling, night sweats, fatigued, poor endurance  Protocols used: [facility]CHING [facility]

## 2017-11-16 NOTE — Telephone Encounter (Signed)
FYI seeing you today

## 2017-11-16 NOTE — Telephone Encounter (Signed)
FYI

## 2017-11-16 NOTE — Patient Instructions (Addendum)
Please take medication and monitor for symptoms of itching. This type of medication has been provided as you have been able to successfully take this class of medication previously.   Monitor blood pressure, document readings, and follow up in 2 weeks for a nurse visit to recheck your blood pressure or sooner if blood pressure average is >140/90.  See information below regarding drug allergies.  ? Please seek medical care immediately if you are having Swelling in your eyes, lips, face, or tongue. ? Swelling in the back of your mouth or your throat. ? Loud breathing. ? A hoarse voice. ? Itchy, red, swollen areas of skin. ? Trouble with breathing, talking, or swallowing. ? A tight feeling in your chest. ? A fast heartbeat.  You have throwing up that gets very bad.  You have watery poop that gets very bad.  You feel dizzy or light-headed.  You pass out.   Drug Allergy A drug allergy is when your body reacts in a bad way to a medicine. This can be life-threatening. If you have an allergic reaction, get help right away, even if the reaction seems gentle (mild). Your doctor may teach you how to use an allergy kit (anaphylaxis kit) and how to give yourself an allergy shot (epinephrine injection). You can give yourself an allergy shot with what is commonly called an auto-injector "pen." Symptoms of a Gentle Reaction  A stuffy nose (nasal congestion).  Tingling in your mouth.  An itchy, red rash. Symptoms of a Very Bad Reaction  Swelling of your eyes, lips, face, or tongue.  Swelling of the back of your mouth and your throat.  Breathing loudly (wheezing).  A hoarse voice.  Itchy, red, swollen areas of skin (hives).  Dizziness or light-headedness.  Passing out (fainting).  Feeling worried or nervous (anxiety).  Feeling confused.  Pain in your belly (abdomen).  Trouble with breathing, talking, or swallowing.  A tight feeling in your chest.  Fast or uneven heartbeats  (palpitations).  Throwing up (vomiting).  Watery poop (diarrhea). Follow these instructions at home: If You Have a Very Bad Allergy:  Always keep an auto-injector pen or your allergy kit with you. These could save your life. Use them as told by your doctor.  Make sure that you, the people who live with you, and your employer know: ? How to use your allergy kit. ? How to use an auto-injector pen to give you an allergy shot.  If you used your auto-injector pen: ? Get more medicine for it right away. This is important in case you have another reaction. ? Get help right away.  Wear a bracelet or necklace that says you have an allergy, if your doctor tells you to do this. General instructions  Avoid medicines that you are allergic to.  Take over-the-counter and prescription medicines only as told by your doctor.  Do not drive until your doctor says it is safe.  If you have itchy, red, swollen areas of skin or a rash: ? Use over-the-counter medicine (antihistamine) as told by your doctor. ? Put cold, wet cloths (cold compresses) on your skin. ? Take baths or showers in cool water. Avoid hot water.  If you had tests done, it is your responsibility to get your test results. Ask your doctor when your results will be ready.  Tell any doctors who care for you that you have a drug allergy.  Keep all follow-up visits as told by your doctor. This is important. Contact a doctor  if:  You start to have any of these: ? A stuffy nose. ? Tingling in your mouth. ? An itchy, red rash.  You have symptoms that last more than 2 days after your reaction.  Your symptoms get worse.  You get new symptoms. Get help right away if:  You had to use your auto-injector pen. You must go to the emergency room even if the medicine seems to be working.  You have any of these: ? Swelling in your eyes, lips, face, or tongue. ? Swelling in the back of your mouth or your throat. ? Loud breathing. ? A  hoarse voice. ? Itchy, red, swollen areas of skin. ? Trouble with breathing, talking, or swallowing. ? A tight feeling in your chest. ? A fast heartbeat.  You have throwing up that gets very bad.  You have watery poop that gets very bad.  You feel dizzy or light-headed.  You pass out. These symptoms may be an emergency. Do not wait to see if the symptoms will go away. Use your auto-injector pen or allergy kit as you have been told. Get medical help right away. Call your local emergency services (911 in the [place] Do not drive yourself to the hospital. This information is not intended to replace advice given to you by your health care provider. Make sure you discuss any questions you have with your health care provider. Document Released: 05/28/2004 Document Revised: 09/26/2015 Document Reviewed: 11/20/2014 Elsevier Interactive Patient Education  [employer].

## 2017-12-02 ENCOUNTER — Ambulatory Visit: Payer: Medicare Other

## 2017-12-06 ENCOUNTER — Ambulatory Visit: Payer: Self-pay | Admitting: *Deleted

## 2017-12-06 ENCOUNTER — Ambulatory Visit (INDEPENDENT_AMBULATORY_CARE_PROVIDER_SITE_OTHER): Payer: Medicare Other | Admitting: Family

## 2017-12-06 ENCOUNTER — Encounter: Payer: Self-pay | Admitting: Family

## 2017-12-06 VITALS — BP 165/85 | HR 101 | Temp 98.5°F | Resp 16 | Wt 139.5 lb

## 2017-12-06 DIAGNOSIS — I10 Essential (primary) hypertension: Secondary | ICD-10-CM | POA: Diagnosis not present

## 2017-12-06 DIAGNOSIS — F411 Generalized anxiety disorder: Secondary | ICD-10-CM | POA: Diagnosis not present

## 2017-12-06 DIAGNOSIS — F5102 Adjustment insomnia: Secondary | ICD-10-CM | POA: Diagnosis not present

## 2017-12-06 MED ORDER — HYDROCHLOROTHIAZIDE 12.5 MG PO CAPS: 12.5000 mg | ORAL_CAPSULE | Freq: Every day | ORAL | 0 refills | Status: DC

## 2017-12-06 MED ORDER — SERTRALINE HCL 50 MG PO TABS: 50.0000 mg | ORAL_TABLET | Freq: Every day | ORAL | 3 refills | Status: DC

## 2017-12-06 NOTE — Assessment & Plan Note (Signed)
Elevated today.  Improved as patient rested in exam room.  Reassuring neurologic exam.  No signs or symptoms to suggest hypertensive urgency or emergency at this time.  EKG is also reassuring.  No acute ischemia or significant changes from prior EKG which I compared the system April 2016. Patient appears to have an intolerance to HCTZ however she is unsure what that may be.  She is been on this medication very recently.  I discontinued the ARB and we will start her on HCTZ for close monitoring.  Patient will let me know how she is doing

## 2017-12-06 NOTE — Telephone Encounter (Signed)
Pt called with complaints of "feeling bad with medication"; she has been having "tiredness, shortness of breath, eyebrow swelling, ankle and wrist swelling, tingling in the body including her scalp, and intermittent tongue swelling"; she also said that she is concerned about her constipation because she has crohn's disease;  the pt states that she started omesartan on 11/20/17; the pt denies shortness of breath, difficulty breathing, or tongue swelling at this time; she has already taken today's dose of omesartan; recommendations made per nurse triage to include seeing a physician within 3 days; the pt normally sees Dr Derrel Nip but she has no availability; pt offered and accepted appointment with Mable Paris, [place], 12/06/17 at 1315; she verbalizes understanding; will route to office for notification of this upcoming appointment.   Reason for Disposition . [1] Mild facial swelling from food reaction AND [2] diagnosis never confirmed by a HCP  Answer Assessment - Initial Assessment Questions 1. ONSET: "When did the swelling start?" (e.g., minutes, hours, days)     11/20/17 in the morning 2. LOCATION: "What part of the face is swollen?"     "area around eye brow"; doesn't occur every day 3. SEVERITY: "How swollen is it?"     mild 4. ITCHING: "Is there any itching?" If so, ask: "How much?"   (Scale 1-10; mild, moderate or severe)     "Itching throughout body" 5. PAIN: "Is the swelling painful to touch?" If so, ask: "How painful is it?"   (Scale 1-10; mild, moderate or severe)    no 6. FEVER: "Do you have a fever?" If so, ask: "What is it, how was it measured, and when did it start?"      no 7. CAUSE: "What do you think is causing the face swelling?"     medication 8. RECURRENT SYMPTOM: "Have you had face swelling before?" If so, ask: "When was the last time?" "What happened that time?"     no 9. OTHER SYMPTOMS: "Do you have any other symptoms?" (e.g., toothache, leg swelling)     moderateankle  and wrist swelling 10. PREGNANCY: "Is there any chance you are pregnant?" "When was your last menstrual period?"       no  Protocols used: [facility]

## 2017-12-06 NOTE — Assessment & Plan Note (Addendum)
Worsening.  Trial of Zoloft.  Reviewed cardiology's last note.  Suspect chest discomfort as described by patient may be  Anxiety related as resolves with patient placing hand on chest. close Follow-up

## 2017-12-06 NOTE — Telephone Encounter (Signed)
Left voicemail for patient to give the office a call back to discuss symptoms she is scheduled to see Mable Paris today 12-06-17.

## 2017-12-06 NOTE — Patient Instructions (Addendum)
Stop olmarstan.   Start hctz, zoloft.   Monitor blood pressure,  Goal is less than 130/80; if persistently higher, please make sooner follow up appointment so we can recheck you blood pressure and manage medications  Labs on Friday -make an appointment at the front  Stay hypervigilant with chest discomfort as we discussed today. Please ensure you have close follow up with your cardiologist.    Heart Attack A heart attack (myocardial infarction, MI) causes damage to the heart that cannot be fixed. A heart attack often happens when a blood clot or other blockage cuts blood flow to the heart. When this happens, certain areas of the heart begin to die. This causes the pain you feel during a heart attack. Follow these instructions at home:  Take medicine as told by your doctor. You may need medicine to: ? Keep your blood from clotting too easily. ? Control your blood pressure. ? Lower your cholesterol. ? Control abnormal heart rhythms.  Change certain behaviors as told by your doctor. This may include: ? Quitting smoking. ? Being active. ? Eating a heart-healthy diet. Ask your doctor for help with this diet. ? Keeping a healthy weight. ? Keeping your diabetes under control. ? Lessening stress. ? Limiting how much alcohol you drink. Do not take these medicines unless your doctor says that you can:  Nonsteroidal anti-inflammatory drugs (NSAIDs). These include: ? Ibuprofen. ? Naproxen. ? Celecoxib.  Vitamin supplements that have vitamin A, vitamin E, or both.  Hormone therapy that contains estrogen with or without progestin.  Get help right away if:  You have sudden chest discomfort.  You have sudden discomfort in your: ? Arms. ? Back. ? Neck. ? Jaw.  You have shortness of breath at any time.  You have sudden sweating or clammy skin.  You feel sick to your stomach (nauseous) or throw up (vomit).  You suddenly get light-headed or dizzy.  You feel your heart beating  fast or skipping beats. These symptoms may be an emergency. Do not wait to see if the symptoms will go away. Get medical help right away. Call your local emergency services (911 in the [place] Do not drive yourself to the hospital. This information is not intended to replace advice given to you by your health care provider. Make sure you discuss any questions you have with your health care provider. Document Released: 10/20/2011 Document Revised: 09/26/2015 Document Reviewed: 06/23/2013 Elsevier Interactive Patient Education  2017 [employer].

## 2017-12-06 NOTE — Progress Notes (Signed)
Subjective:    Patient ID: Kim Sanchez, female    DOB: 08/17/1940, 77 y.o.   MRN: [id number]  CC: Kim Sanchez is a 77 y.o. female who presents today for an acute visit.    HPI: CC: elevated blood pressure and side effect from olmarsartan.   for a couple of days, stopped olmarsartan all together and symptoms resolved. Notes took half tablet today   When takes a dose of olmarsartan, feels like heart rate elevates.Feels 'discomfort' in chest after taking olmartan/hctz. 'it is not chest pain.' Symptom resolves when places hand on chest.  Describes this morning felt like tongue was 'slightly swollen.'  Resolved now.  States it is  'not bothersome'.   No trouble swallowing, SOB.  Denies exertional chest pain or pressure, numbness or tingling radiating to left arm or jaw, palpitations, dizziness, frequent headaches, changes in vision.    GAD- No depression. No hi/ SI. Taking the ativan BID. No panic attacks. Wakes up couple of times at night. Tried lexapro in the past. Melantonin hasnt helped. Husband has had a stroke.   7/16 - [place] and started on olmarstartan only at last visit.     Dr Fath-10/2017 EKG done in office ( unable to see from [place]). atypical chest pain, HTN, pulmonary HTN.  HISTORY:  Past Medical History:  Diagnosis Date  . Arthritis   . Breast cancer, right breast ([place]) 08/24/2012   Histologic grade 1, invasive mammary carcinoma, no specific type. 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  . Crohn's disease ([place])   . GERD (gastroesophageal reflux disease)   . History of blood transfusion 1960  . Hypertension   . Personal history of radiation therapy    Past Surgical History:  Procedure Laterality Date  . BRAIN SURGERY  1980   breast biopsies, benign  . 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 BREAST BIOPSY Bilateral    - core Bx- neg  . BREAST LUMPECTOMY Right May 2014   T1c, N0; ER/PR positive, HER-2/neu not overexpressing.  Marland Kitchen BREAST SURGERY  1980's   fibro cyst both breast in [place]  . COLONOSCOPY  2017  . DILATION AND CURETTAGE OF UTERUS     Family History  Problem Relation Age of Onset  . Cancer Paternal Grandmother   . Heart disease Sister   . Diabetes Sister   . Breast cancer Neg Hx     Allergies: Letrozole; Amlodipine; Exemestane; Hydrochlorothiazide; Lexapro [escitalopram oxalate]; Lisinopril; and Sulfa antibiotics Current Outpatient Medications on File Prior to Visit  Medication Sig Dispense Refill  . anastrozole (ARIMIDEX) 1 MG tablet Take 1 tablet (1 mg total) by mouth daily. 90 tablet 0  . aspirin EC 81 MG tablet Take 81 mg by mouth daily.    . calcium carbonate (TUMS - DOSED IN MG ELEMENTAL CALCIUM) 500 MG chewable tablet Chew 1 tablet by mouth daily.    . cholecalciferol (VITAMIN D) 1000 units tablet Take 1,000 Units by mouth daily.    Marland Kitchen inFLIXimab (REMICADE) 100 MG injection Inject 1 mg into the vein every 8 (eight) weeks.    Marland Kitchen loratadine (CLARITIN) 10 MG tablet Take 10 mg by mouth daily as needed for allergies.    Marland Kitchen LORazepam (ATIVAN) 0.5 MG tablet TAKE 1 TABLET BY MOUTH EVERY 8 HOURS AS NEEDED FOR ANXIETY *MAXIMUM OF 2 TABLETS A DAY* 60 tablet 3  . Omega-3 Fatty Acids (FISH OIL PO) Take  by mouth.    . polyethylene glycol powder (GLYCOLAX/MIRALAX) powder Take by mouth.    . vitamin E 400 UNIT capsule Take by mouth.    . [DISCONTINUED] losartan-hydrochlorothiazide (HYZAAR) 50-12.5 MG tablet Take 1 tablet by mouth daily. 90 tablet 0   No current facility-administered medications on file prior to visit.     Social History   Tobacco Use  . Smoking status: Never Smoker  . Smokeless tobacco: Never Used  Substance Use Topics  . Alcohol use: Yes    Alcohol/week: 0.6 oz    Types: 1 Glasses of wine per week    Comment: OCC  . Drug use: No    Review of Systems  Constitutional: Negative for chills and fever.  Respiratory: Negative  for cough, shortness of breath and wheezing.   Cardiovascular: Negative for chest pain and palpitations.  Gastrointestinal: Negative for nausea and vomiting.      Objective:    BP (!) 165/85   Pulse (!) 101   Temp 98.5 F (36.9 C) (Oral)   Resp 16   Wt 139 lb 8 oz (63.3 kg)   SpO2 96%   BMI 25.51 kg/m    Physical Exam  Constitutional: She appears well-developed and well-nourished.  HENT:  Mouth/Throat: Uvula is midline, oropharynx is clear and moist and mucous membranes are normal.  Eyes: Pupils are equal, round, and reactive to light. Conjunctivae and EOM are normal.  Fundus normal bilaterally.   Cardiovascular: Normal rate, regular rhythm, normal heart sounds and normal pulses.  Pulmonary/Chest: Effort normal and breath sounds normal. She has no wheezes. She has no rhonchi. She has no rales. Chest wall is not dull to percussion. She exhibits no tenderness and no bony tenderness.  Neurological: She is alert. She has normal strength. No cranial nerve deficit or sensory deficit. She displays a negative Romberg sign.  Reflex Scores:      Bicep reflexes are 2+ on the right side and 2+ on the left side.      Patellar reflexes are 2+ on the right side and 2+ on the left side. Grip equal and strong bilateral upper extremities. Gait strong and steady. Able to perform rapid alternating movement without difficulty.   Skin: Skin is warm and dry.  Psychiatric: She has a normal mood and affect. Her speech is normal and behavior is normal. Thought content normal.  Vitals reviewed.      Assessment & Plan:   Problem List Items Addressed This Visit      Cardiovascular and Mediastinum   Hypertension - Primary    Elevated today.  Improved as patient rested in exam room.  Reassuring neurologic exam.  No signs or symptoms to suggest hypertensive urgency or emergency at this time.  EKG is also reassuring.  No acute ischemia or significant changes from prior EKG which I compared the system April  2016. Patient appears to have an intolerance to HCTZ however she is unsure what that may be.  She is been on this medication very recently.  I discontinued the ARB and we will start her on HCTZ for close monitoring.  Patient will let me know how she is doing      Relevant Medications   hydrochlorothiazide (MICROZIDE) 12.5 MG capsule   Other Relevant Orders   EKG 12-Lead (Completed)   Basic metabolic panel     Other   Generalized anxiety disorder    Worsening.  Trial of Zoloft.  Reviewed cardiology's last note.  Suspect chest discomfort as described  by patient may be  Anxiety related as resolves with patient placing hand on chest. close Follow-up      Relevant Medications   sertraline (ZOLOFT) 50 MG tablet   Insomnia due to stress   Relevant Medications   sertraline (ZOLOFT) 50 MG tablet        I have discontinued Herbert Pun I. Rizzolo "AGGIE"'s olmesartan-hydrochlorothiazide and olmesartan. I am also having her start on sertraline and hydrochlorothiazide. Additionally, I am having her maintain her inFLIXimab, aspirin EC, Omega-3 Fatty Acids (FISH OIL PO), vitamin E, polyethylene glycol powder, cholecalciferol, LORazepam, anastrozole, loratadine, and calcium carbonate.   Meds ordered this encounter  Medications  . sertraline (ZOLOFT) 50 MG tablet    Sig: Take 1 tablet (50 mg total) by mouth at bedtime.    Dispense:  90 tablet    Refill:  3    Order Specific Question:   Supervising Provider    Answer:   Deborra Medina L [[id number]]  . hydrochlorothiazide (MICROZIDE) 12.5 MG capsule    Sig: Take 1 capsule (12.5 mg total) by mouth daily.    Dispense:  90 capsule    Refill:  0    Order Specific Question:   Supervising Provider    Answer:   [name] [[id number]]    Return precautions given.   Risks, benefits, and alternatives of the medications and treatment plan prescribed today were discussed, and patient expressed understanding.   Education regarding symptom management and diagnosis  given to patient on AVS.  Continue to follow with [name], MD for routine health maintenance.   Kim Sanchez and I agreed with plan.   Mable Paris, FNP

## 2017-12-08 ENCOUNTER — Telehealth: Payer: Self-pay | Admitting: Internal Medicine

## 2017-12-08 NOTE — Telephone Encounter (Signed)
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 Pt is wanting to know if it okay for her to take the hctz?

## 2017-12-08 NOTE — Telephone Encounter (Signed)
She has taken in  The past (2017, 2018) and tolerated it without a reaction because the sulfur component is minimal.

## 2017-12-08 NOTE — Telephone Encounter (Signed)
Copied from [place]. 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  Patient wants to know if she should still take this medication.  Please advise. CB# [phone].

## 2017-12-08 NOTE — Telephone Encounter (Signed)
Spoke with pt to let her know that Dr. Derrel Nip said the sulfur in the hctz is very minimal and that she did take the medication in 2017 and 2018 without any reaction. Pt gave a verbal understanding and stated that she would start the medication in the morning.

## 2017-12-10 ENCOUNTER — Other Ambulatory Visit (INDEPENDENT_AMBULATORY_CARE_PROVIDER_SITE_OTHER): Payer: Medicare Other

## 2017-12-10 DIAGNOSIS — E119 Type 2 diabetes mellitus without complications: Secondary | ICD-10-CM | POA: Diagnosis not present

## 2017-12-10 DIAGNOSIS — I10 Essential (primary) hypertension: Secondary | ICD-10-CM | POA: Diagnosis not present

## 2017-12-10 LAB — LIPID PANEL
CHOL/HDL RATIO: 4
Cholesterol: 247 mg/dL — ABNORMAL HIGH (ref 0–200)
HDL: 66 mg/dL (ref >39.00)
LDL Cholesterol: 166 mg/dL — ABNORMAL HIGH (ref 0–99)
NONHDL: 181.07
TRIGLYCERIDES: 75 mg/dL (ref 0.0–149.0)
VLDL: 15 mg/dL (ref 0.0–40.0)

## 2017-12-10 LAB — MICROALBUMIN / CREATININE URINE RATIO
CREATININE, U: 49.1 mg/dL
Microalb Creat Ratio: 1.4 mg/g (ref 0.0–30.0)
Microalb, Ur: <0.7 mg/dL (ref 0.0–1.9)

## 2017-12-10 LAB — HEMOGLOBIN A1C: HEMOGLOBIN A1C: 6.5 % (ref 4.6–6.5)

## 2017-12-10 LAB — COMPREHENSIVE METABOLIC PANEL
ALK PHOS: 70 U/L (ref 39–117)
ALT: 11 U/L (ref 0–35)
AST: 15 U/L (ref 0–37)
Albumin: 3.9 g/dL (ref 3.5–5.2)
BUN: 12 mg/dL (ref 6–23)
CALCIUM: 9.3 mg/dL (ref 8.4–10.5)
CHLORIDE: 103 meq/L (ref 96–112)
CO2: 28 mEq/L (ref 19–32)
Creatinine, Ser: 0.71 mg/dL (ref 0.40–1.20)
GFR: 84.92 mL/min (ref >60.00)
Glucose, Bld: 116 mg/dL — ABNORMAL HIGH (ref 70–99)
Potassium: 3.7 mEq/L (ref 3.5–5.1)
SODIUM: 140 meq/L (ref 135–145)
Total Bilirubin: 0.8 mg/dL (ref 0.2–1.2)
Total Protein: 7.2 g/dL (ref 6.0–8.3)

## 2017-12-13 ENCOUNTER — Encounter: Payer: Self-pay | Admitting: Family

## 2017-12-14 NOTE — Telephone Encounter (Signed)
Left voicemail for patient to call. ? Did heart rate lower after taking lorazepam

## 2017-12-15 DIAGNOSIS — Z8719 Personal history of other diseases of the digestive system: Secondary | ICD-10-CM | POA: Diagnosis not present

## 2017-12-15 DIAGNOSIS — K5080 Crohn's disease of both small and large intestine without complications: Secondary | ICD-10-CM | POA: Diagnosis not present

## 2017-12-15 NOTE — Telephone Encounter (Signed)
Left voicemail to call

## 2017-12-16 ENCOUNTER — Ambulatory Visit: Payer: Medicare Other

## 2017-12-16 ENCOUNTER — Telehealth: Payer: Self-pay

## 2017-12-16 DIAGNOSIS — R0602 Shortness of breath: Secondary | ICD-10-CM | POA: Insufficient documentation

## 2017-12-16 DIAGNOSIS — R002 Palpitations: Secondary | ICD-10-CM | POA: Diagnosis not present

## 2017-12-16 DIAGNOSIS — I272 Pulmonary hypertension, unspecified: Secondary | ICD-10-CM | POA: Diagnosis not present

## 2017-12-16 DIAGNOSIS — I10 Essential (primary) hypertension: Secondary | ICD-10-CM | POA: Diagnosis not present

## 2017-12-16 DIAGNOSIS — R0789 Other chest pain: Secondary | ICD-10-CM | POA: Diagnosis not present

## 2017-12-16 NOTE — Telephone Encounter (Signed)
Copied from [place] [id number]. Topic: Quick Communication - Lab Results >> Dec 16, 2017  2:38 PM Sheran Luz wrote: Maudie Mercury from [place] called and requested pts EKG results faxed over.  [phone]   EKG faxed to [facility] to the fax number listed

## 2017-12-16 NOTE — Telephone Encounter (Signed)
See mychart message, where patient saw cardiology regarding elevated heart rate

## 2017-12-22 NOTE — Telephone Encounter (Addendum)
Left voice mail for patient to call back ok for [facility] to speak to patient   see Margaret's recommendations regarding Zoloft . Please advise .

## 2017-12-27 NOTE — Telephone Encounter (Signed)
Patient returned call- patient was given the message from Rande Brunt, NP and patient has decided that since the changes that have been made by her cardiologist- she is feeling some better. She wants to hold off on starting the Zoloft and she will discuss that at her appointment with Joycelyn Schmid in September. She does have follow up with the cardiologist for some changes on the monitor and she will also be able to discuss those. Thank you for being so attentive and taking such good care of her.

## 2018-01-10 ENCOUNTER — Ambulatory Visit: Payer: Medicare Other | Admitting: Family

## 2018-01-13 DIAGNOSIS — R002 Palpitations: Secondary | ICD-10-CM | POA: Diagnosis not present

## 2018-01-13 DIAGNOSIS — R000 Tachycardia, unspecified: Secondary | ICD-10-CM | POA: Diagnosis not present

## 2018-01-13 DIAGNOSIS — R0789 Other chest pain: Secondary | ICD-10-CM | POA: Diagnosis not present

## 2018-01-13 DIAGNOSIS — I10 Essential (primary) hypertension: Secondary | ICD-10-CM | POA: Diagnosis not present

## 2018-01-13 DIAGNOSIS — I272 Pulmonary hypertension, unspecified: Secondary | ICD-10-CM | POA: Diagnosis not present

## 2018-01-20 IMAGING — CR DG ABDOMEN 1V
1 series · 1 of 1 positions shown · non-contrast
Comparison: None.

CLINICAL DATA: Abdominal pain, particularly left lower quadrant for
1 week

EXAM:
ABDOMEN - 1 VIEW

[t abdomen supine]
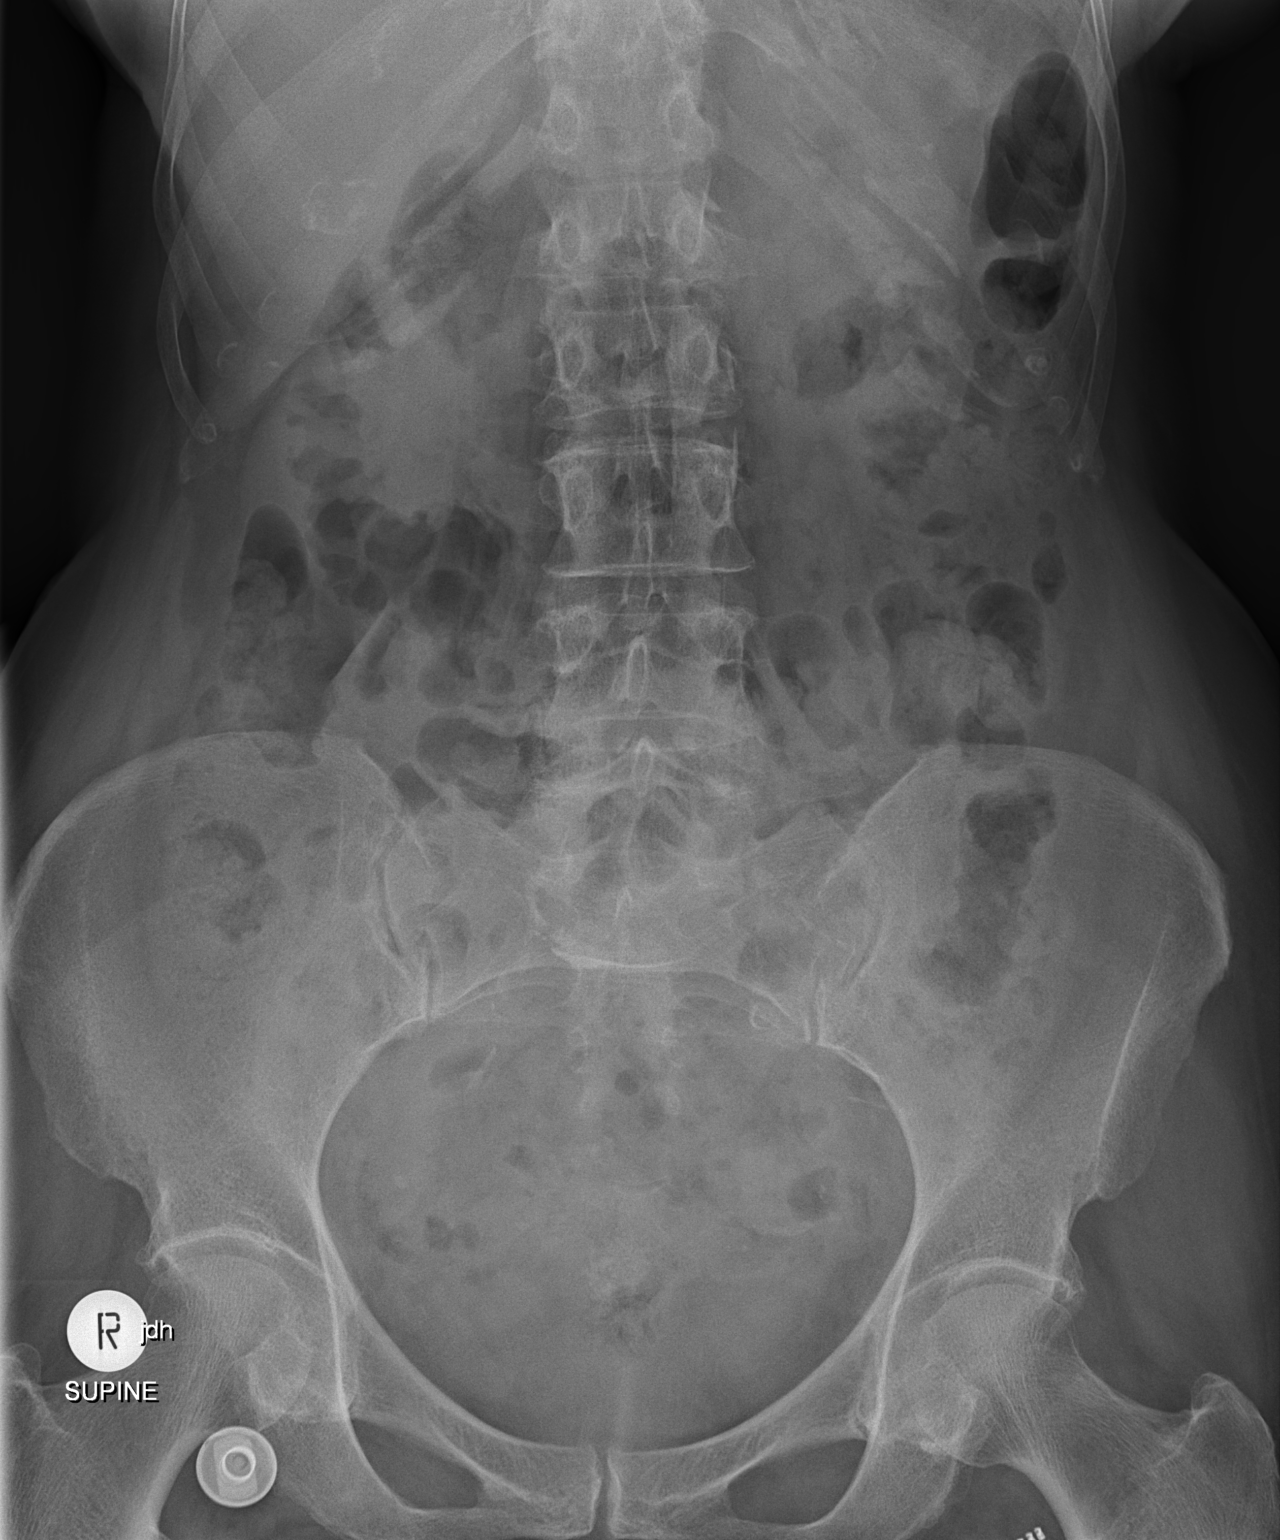

[1 of 1 positions shown; findings below may reference images not displayed]

FINDINGS: A supine film of the abdomen shows a nonspecific bowel gas pattern.
No radiographic evidence of constipation is seen. No opaque calculi
are seen. The bones are unremarkable.
IMPRESSION: No bowel obstruction.  No radiographic evidence of constipation.

## 2018-02-02 ENCOUNTER — Ambulatory Visit: Payer: Medicare Other | Admitting: Family

## 2018-02-04 DIAGNOSIS — Z23 Encounter for immunization: Secondary | ICD-10-CM | POA: Diagnosis not present

## 2018-02-10 DIAGNOSIS — I272 Pulmonary hypertension, unspecified: Secondary | ICD-10-CM | POA: Diagnosis not present

## 2018-02-10 DIAGNOSIS — I10 Essential (primary) hypertension: Secondary | ICD-10-CM | POA: Diagnosis not present

## 2018-02-10 DIAGNOSIS — E785 Hyperlipidemia, unspecified: Secondary | ICD-10-CM | POA: Diagnosis not present

## 2018-02-11 DIAGNOSIS — K5080 Crohn's disease of both small and large intestine without complications: Secondary | ICD-10-CM | POA: Diagnosis not present

## 2018-02-14 ENCOUNTER — Other Ambulatory Visit: Payer: Self-pay | Admitting: *Deleted

## 2018-02-14 NOTE — Telephone Encounter (Signed)
1. 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 Continue with anastrozole, completing 5 years of treatment in September of 2019. Patient's most recent mammogram on Oct 01, 2016 was reported as BI-RADS 2. Repeat in May 2019. Return to clinic in 6 months for routine evaluation.     Patient informed that she has completed her 5 years of treatment

## 2018-02-20 NOTE — Progress Notes (Signed)
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  INTERVAL HISTORY: Patient returns to clinic today for routine six-month evaluation.  She completed 5 years of letrozole in September 2019.  She also reports a normal breast exam by her primary care physician approximately 1 month ago.  She currently feels well and is asymptomatic. She has no neurologic complaints.  She denies any fevers or recent illnesses.  She has a good appetite and denies weight loss.  She denies any chest pain or shortness of breath.  She has no nausea, vomiting, constipation, or diarrhea.  She has no urinary complaints.  Patient feels at her baseline offers no specific complaints today.  REVIEW OF SYSTEMS:   Review of Systems  Constitutional: Negative.  Negative for fever, malaise/fatigue and weight loss.  Respiratory: Negative.  Negative for cough and shortness of breath.   Cardiovascular: Negative.  Negative for chest pain and leg swelling.  Gastrointestinal: Negative.  Negative for abdominal pain.  Genitourinary: Negative.  Negative for dysuria.  Musculoskeletal: Negative.  Negative for back pain.  Skin: Negative.  Negative for rash.  Neurological: Negative.  Negative for sensory change, focal weakness and weakness.  Psychiatric/Behavioral: Negative.  The patient is not nervous/anxious and does not have insomnia.    As per HPI. Otherwise, a complete review of systems is negative.  PAST MEDICAL HISTORY: Past Medical History:  Diagnosis Date  . Arthritis   . Breast cancer, right breast ([place]) 08/24/2012   Histologic grade 1, invasive mammary  carcinoma, no specific type. 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  . Crohn's disease ([place])   . GERD (gastroesophageal reflux disease)   . History of blood transfusion 1960  . Hypertension   . Personal history of radiation therapy     PAST SURGICAL HISTORY: Past Surgical History:  Procedure Laterality Date  . BRAIN SURGERY  1980   breast biopsies, benign  . 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 BREAST BIOPSY Bilateral    - core Bx- neg  . BREAST LUMPECTOMY Right May 2014   T1c, N0; ER/PR positive, HER-2/neu not overexpressing.  Marland Kitchen BREAST SURGERY  1980's   fibro cyst both breast in [place]  . COLONOSCOPY  2017  . DILATION AND CURETTAGE OF UTERUS      FAMILY HISTORY Family History  Problem Relation Age of Onset  . Cancer Paternal Grandmother   . Heart disease Sister   . Diabetes Sister   . Breast cancer Neg Hx        ADVANCED DIRECTIVES:    HEALTH MAINTENANCE: Social History   Tobacco Use  . Smoking status: Never Smoker  . Smokeless tobacco: Never Used  Substance Use Topics  . Alcohol use: Yes    Alcohol/week: 1.0 standard drinks    Types: 1 Glasses of wine per week    Comment: OCC  . Drug use: No     Colonoscopy:  PAP:  Bone density:  Lipid panel:  Allergies  Allergen Reactions  . Letrozole Anaphylaxis and Itching  . Amlodipine Itching  . Exemestane Nausea Only  . Hydrochlorothiazide Other (See Comments)  . Lexapro [Escitalopram Oxalate]  Other (See Comments)    intolerance  . Lisinopril Cough  . Sulfa Antibiotics Other (See Comments)    Current Outpatient Medications  Medication Sig Dispense Refill  . aspirin EC 81 MG tablet Take 81 mg by mouth daily.    Marland Kitchen atorvastatin (LIPITOR) 10 MG tablet Take 10 mg by mouth daily.  11  . calcium carbonate (TUMS - DOSED IN MG ELEMENTAL CALCIUM) 500 MG chewable tablet Chew 1 tablet by mouth daily.    . chlorthalidone (HYGROTON) 25 MG  tablet Take 25 mg by mouth daily.  11  . cholecalciferol (VITAMIN D) 1000 units tablet Take 1,000 Units by mouth daily.    Marland Kitchen inFLIXimab (REMICADE) 100 MG injection Inject 1 mg into the vein every 8 (eight) weeks.    Marland Kitchen loratadine (CLARITIN) 10 MG tablet Take 10 mg by mouth daily as needed for allergies.    Marland Kitchen LORazepam (ATIVAN) 0.5 MG tablet TAKE 1 TABLET BY MOUTH EVERY 8 HOURS AS NEEDED FOR ANXIETY *MAXIMUM OF 2 TABLETS A DAY* 60 tablet 3  . nystatin (MYCOSTATIN) 100000 UNIT/ML suspension SWISH & SWALLOW 5 ML BY MOUTH 4 TIMES DAILY  1  . Omega-3 Fatty Acids (FISH OIL PO) Take by mouth.    . polyethylene glycol powder (GLYCOLAX/MIRALAX) powder Take 17 g by mouth 2 (two) times daily as needed. 3350 g 11  . vitamin E 400 UNIT capsule Take by mouth.     No current facility-administered medications for this visit.     OBJECTIVE: Vitals:   02/24/18 1044 02/24/18 1050  BP:  132/83  Pulse:  71  Resp: 12   Temp:  98.6 F (37 C)     Body mass index is 25.11 kg/m.    ECOG FS:0 - Asymptomatic  General: Well-developed, well-nourished, no acute distress. Eyes: Pink conjunctiva, anicteric sclera. HEENT: Normocephalic, moist mucous membranes. Breast: Exam recently performed by another provider. Lungs: Clear to auscultation bilaterally. Heart: Regular rate and rhythm. No rubs, murmurs, or gallops. Abdomen: Soft, nontender, nondistended. No organomegaly noted, normoactive bowel sounds. Musculoskeletal: No edema, cyanosis, or clubbing. Neuro: Alert, answering all questions appropriately. Cranial nerves grossly intact. Skin: No rashes or petechiae noted. Psych: Normal affect.  LAB RESULTS:  Lab Results  Component Value Date   NA 140 12/10/2017   K 3.7 12/10/2017   CL 103 12/10/2017   CO2 28 12/10/2017   GLUCOSE 116 (H) 12/10/2017   BUN 12 12/10/2017   CREATININE 0.71 12/10/2017   CALCIUM 9.3 12/10/2017   PROT 7.2 12/10/2017   ALBUMIN 3.9 12/10/2017   AST 15 12/10/2017   ALT 11  12/10/2017   ALKPHOS 70 12/10/2017   BILITOT 0.8 12/10/2017   GFRNONAA >60 12/31/2016   GFRAA >60 12/31/2016    Lab Results  Component Value Date   WBC 5.8 12/31/2016   NEUTROABS 1.9 09/30/2016   HGB 13.5 12/31/2016   HCT 39.7 12/31/2016   MCV 91.1 12/31/2016   PLT 215 12/31/2016     STUDIES: No results found.  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 No Oncotype score available.  PLAN:    1.  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  Patient completed 5 years of anastrozole in September 2019.  Her most recent mammogram on October 05, 2017 was reported as BI-RADS 2, repeat in June 2020.  Return to clinic in 1 year  for routine evaluation.  2. Osteopenia: Bone mineral density on March 02, 2017 with a T score of -1.7 which is slightly worse than one year prior when her T score was reported at -1.5.  Repeat bone mineral density in the next 1 to 2 weeks.  Continue calcium and vitamin D supplementation.   3. Arthritis: Chronic and unchanged.  Continue evaluation and treatment per rheumatology.    Patient expressed understanding and was in agreement with this plan. She also understands that She can call clinic at any time with any questions, concerns, or complaints.    Lloyd Huger, MD   02/28/2018 3:15 PM

## 2018-02-21 ENCOUNTER — Ambulatory Visit (INDEPENDENT_AMBULATORY_CARE_PROVIDER_SITE_OTHER): Payer: Medicare Other | Admitting: Internal Medicine

## 2018-02-21 ENCOUNTER — Encounter: Payer: Self-pay | Admitting: Internal Medicine

## 2018-02-21 VITALS — BP 132/76 | HR 89 | Temp 98.6°F | Resp 14 | Ht 62.0 in | Wt 138.6 lb

## 2018-02-21 DIAGNOSIS — Z79899 Other long term (current) drug therapy: Secondary | ICD-10-CM

## 2018-02-21 DIAGNOSIS — E119 Type 2 diabetes mellitus without complications: Secondary | ICD-10-CM

## 2018-02-21 DIAGNOSIS — I10 Essential (primary) hypertension: Secondary | ICD-10-CM | POA: Diagnosis not present

## 2018-02-21 DIAGNOSIS — K5010 Crohn's disease of large intestine without complications: Secondary | ICD-10-CM

## 2018-02-21 DIAGNOSIS — F411 Generalized anxiety disorder: Secondary | ICD-10-CM | POA: Diagnosis not present

## 2018-02-21 DIAGNOSIS — Z853 Personal history of malignant neoplasm of breast: Secondary | ICD-10-CM

## 2018-02-21 DIAGNOSIS — D84821 Immunodeficiency due to drugs: Secondary | ICD-10-CM

## 2018-02-21 DIAGNOSIS — E7800 Pure hypercholesterolemia, unspecified: Secondary | ICD-10-CM

## 2018-02-21 MED ORDER — POLYETHYLENE GLYCOL 3350 17 GM/SCOOP PO POWD: 17.0000 g | Freq: Two times a day (BID) | ORAL | 11 refills | Status: AC | PRN

## 2018-02-21 MED ORDER — ZOSTER VAC RECOMB ADJUVANTED 50 MCG/0.5ML IM SUSR: 0.5000 mL | Freq: Once | INTRAMUSCULAR | 1 refills | Status: AC

## 2018-02-21 NOTE — Patient Instructions (Addendum)
You do NOT need any more PAP smears or pelvic exam unless you develop discharge, itching, bleeding or foul odor.    You do not have to have any interim colon cancer screening since your next colonoscopy will be in 2022   The ShingRx vaccine is now available in local pharmacies and is much more protective than Zostavax when both doses have been received.   It is therefore ADVISED for all interested adults over 50 to prevent shingles   I recommend resuming your glass of wine with dinner.    The "Smooth move" may be causing your nausea,  So I would suspend it for a week and use your miralax/benefiver instead  If your headaches continue,  I would not disagree with suspending your atorvastatin    Your blood pressure is under control   You are not due for labs until after Mar 13 2018 (you had everything done August 9 )    Health Maintenance for Postmenopausal Women Menopause is a normal process in which your reproductive ability comes to an end. This process happens gradually over a span of months to years, usually between the ages of 12 and 59. Menopause is complete when you have missed 12 consecutive menstrual periods. It is important to talk with your health care provider about some of the most common conditions that affect postmenopausal women, such as heart disease, cancer, and bone loss (osteoporosis). Adopting a healthy lifestyle and getting preventive care can help to promote your health and wellness. Those actions can also lower your chances of developing some of these common conditions. What should I know about menopause? During menopause, you may experience a number of symptoms, such as:  Moderate-to-severe hot flashes.  Night sweats.  Decrease in sex drive.  Mood swings.  Headaches.  Tiredness.  Irritability.  Memory problems.  Insomnia.  Choosing to treat or not to treat menopausal changes is an individual decision that you make with your health care provider. What  should I know about hormone replacement therapy and supplements? Hormone therapy products are effective for treating symptoms that are associated with menopause, such as hot flashes and night sweats. Hormone replacement carries certain risks, especially as you become older. If you are thinking about using estrogen or estrogen with progestin treatments, discuss the benefits and risks with your health care provider. What should I know about heart disease and stroke? Heart disease, heart attack, and stroke become more likely as you age. This may be due, in part, to the hormonal changes that your body experiences during menopause. These can affect how your body processes dietary fats, triglycerides, and cholesterol. Heart attack and stroke are both medical emergencies. There are many things that you can do to help prevent heart disease and stroke:  Have your blood pressure checked at least every 1-2 years. High blood pressure causes heart disease and increases the risk of stroke.  If you are 49-69 years old, ask your health care provider if you should take aspirin to prevent a heart attack or a stroke.  Do not use any tobacco products, including cigarettes, chewing tobacco, or electronic cigarettes. If you need help quitting, ask your health care provider.  It is important to eat a healthy diet and maintain a healthy weight. ? Be sure to include plenty of vegetables, fruits, low-fat dairy products, and lean protein. ? Avoid eating foods that are high in solid fats, added sugars, or salt (sodium).  Get regular exercise. This is one of the most important things  that you can do for your health. ? Try to exercise for at least 150 minutes each week. The type of exercise that you do should increase your heart rate and make you sweat. This is known as moderate-intensity exercise. ? Try to do strengthening exercises at least twice each week. Do these in addition to the moderate-intensity exercise.  Know your  numbers.Ask your health care provider to check your cholesterol and your blood glucose. Continue to have your blood tested as directed by your health care provider.  What should I know about cancer screening? There are several types of cancer. Take the following steps to reduce your risk and to catch any cancer development as early as possible. Breast Cancer  Practice breast self-awareness. ? This means understanding how your breasts normally appear and feel. ? It also means doing regular breast self-exams. Let your health care provider know about any changes, no matter how small.  If you are 79 or older, have a clinician do a breast exam (clinical breast exam or CBE) every year. Depending on your age, family history, and medical history, it may be recommended that you also have a yearly breast X-ray (mammogram).  If you have a family history of breast cancer, talk with your health care provider about genetic screening.  If you are at high risk for breast cancer, talk with your health care provider about having an MRI and a mammogram every year.  Breast cancer (BRCA) gene test is recommended for women who have family members with BRCA-related cancers. Results of the assessment will determine the need for genetic counseling and BRCA1 and for BRCA2 testing. BRCA-related cancers include these types: ? Breast. This occurs in males or females. ? Ovarian. ? Tubal. This may also be called fallopian tube cancer. ? Cancer of the abdominal or pelvic lining (peritoneal cancer). ? Prostate. ? Pancreatic.  Cervical, Uterine, and Ovarian Cancer Your health care provider may recommend that you be screened regularly for cancer of the pelvic organs. These include your ovaries, uterus, and vagina. This screening involves a pelvic exam, which includes checking for microscopic changes to the surface of your cervix (Pap test).  For women ages 21-65, health care providers may recommend a pelvic exam and a Pap  test every three years. For women ages 34-65, they may recommend the Pap test and pelvic exam, combined with testing for human papilloma virus (HPV), every five years. Some types of HPV increase your risk of cervical cancer. Testing for HPV may also be done on women of any age who have unclear Pap test results.  Other health care providers may not recommend any screening for nonpregnant women who are considered low risk for pelvic cancer and have no symptoms. Ask your health care provider if a screening pelvic exam is right for you.  If you have had past treatment for cervical cancer or a condition that could lead to cancer, you need Pap tests and screening for cancer for at least 20 years after your treatment. If Pap tests have been discontinued for you, your risk factors (such as having a new sexual partner) need to be reassessed to determine if you should start having screenings again. Some women have medical problems that increase the chance of getting cervical cancer. In these cases, your health care provider may recommend that you have screening and Pap tests more often.  If you have a family history of uterine cancer or ovarian cancer, talk with your health care provider about genetic screening.  If you have vaginal bleeding after reaching menopause, tell your health care provider.  There are currently no reliable tests available to screen for ovarian cancer.  Lung Cancer Lung cancer screening is recommended for adults 77-70 years old who are at high risk for lung cancer because of a history of smoking. A yearly low-dose CT scan of the lungs is recommended if you:  Currently smoke.  Have a history of at least 30 pack-years of smoking and you currently smoke or have quit within the past 15 years. A pack-year is smoking an average of one pack of cigarettes per day for one year.  Yearly screening should:  Continue until it has been 15 years since you quit.  Stop if you develop a health  problem that would prevent you from having lung cancer treatment.  Colorectal Cancer  This type of cancer can be detected and can often be prevented.  Routine colorectal cancer screening usually begins at age 76 and continues through age 69.  If you have risk factors for colon cancer, your health care provider may recommend that you be screened at an earlier age.  If you have a family history of colorectal cancer, talk with your health care provider about genetic screening.  Your health care provider may also recommend using home test kits to check for hidden blood in your stool.  A small camera at the end of a tube can be used to examine your colon directly (sigmoidoscopy or colonoscopy). This is done to check for the earliest forms of colorectal cancer.  Direct examination of the colon should be repeated every 5-10 years until age 77. However, if early forms of precancerous polyps or small growths are found or if you have a family history or genetic risk for colorectal cancer, you may need to be screened more often.  Skin Cancer  Check your skin from head to toe regularly.  Monitor any moles. Be sure to tell your health care provider: ? About any new moles or changes in moles, especially if there is a change in a mole's shape or color. ? If you have a mole that is larger than the size of a pencil eraser.  If any of your family members has a history of skin cancer, especially at a young age, talk with your health care provider about genetic screening.  Always use sunscreen. Apply sunscreen liberally and repeatedly throughout the day.  Whenever you are outside, protect yourself by wearing long sleeves, pants, a wide-brimmed hat, and sunglasses.  What should I know about osteoporosis? Osteoporosis is a condition in which bone destruction happens more quickly than new bone creation. After menopause, you may be at an increased risk for osteoporosis. To help prevent osteoporosis or the  bone fractures that can happen because of osteoporosis, the following is recommended:  If you are 35-41 years old, get at least 1,000 mg of calcium and at least 600 mg of vitamin D per day.  If you are older than age 84 but younger than age 58, get at least 1,200 mg of calcium and at least 600 mg of vitamin D per day.  If you are older than age 70, get at least 1,200 mg of calcium and at least 800 mg of vitamin D per day.  Smoking and excessive alcohol intake increase the risk of osteoporosis. Eat foods that are rich in calcium and vitamin D, and do weight-bearing exercises several times each week as directed by your health care provider. What should  I know about how menopause affects my mental health? Depression may occur at any age, but it is more common as you become older. Common symptoms of depression include:  Low or sad mood.  Changes in sleep patterns.  Changes in appetite or eating patterns.  Feeling an overall lack of motivation or enjoyment of activities that you previously enjoyed.  Frequent crying spells.  Talk with your health care provider if you think that you are experiencing depression. What should I know about immunizations? It is important that you get and maintain your immunizations. These include:  Tetanus, diphtheria, and pertussis (Tdap) booster vaccine.  Influenza every year before the flu season begins.  Pneumonia vaccine.  Shingles vaccine.  Your health care provider may also recommend other immunizations. This information is not intended to replace advice given to you by your health care provider. Make sure you discuss any questions you have with your health care provider. Document Released: 06/12/2005 Document Revised: 11/08/2015 Document Reviewed: 01/22/2015 Elsevier Interactive Patient Education  2018 [employer].

## 2018-02-21 NOTE — Assessment & Plan Note (Signed)
Shigngrx vaccine recommended. Up to date with all others

## 2018-02-21 NOTE — Assessment & Plan Note (Addendum)
She has completed 5 yrs of anastrozole with no recurrence by mammogram June 2019 .  Follow up with Oncology

## 2018-02-21 NOTE — Progress Notes (Signed)
Patient ID: Kim Sanchez, female    DOB: 04-02-1941  Age: 77 y.o. MRN: [id number]  The patient is here for follow up and  management ofchronic and acute problems.    Dermatolgy exam annually  done by isenstein  Sees oncology tihs week  For breast cancer follow up [facility]  Due for annual diab eye exam Colonoscopy at [facility] 2017 follow up crohn's    The risk factors are reflected in the social history.  The roster of all physicians providing medical care to patient - is listed in the Snapshot section of the chart.  Activities of daily living:  The patient is 100% independent in all ADLs: dressing, toileting, feeding as well as independent mobility  Home safety : The patient has smoke detectors in the home. They wear seatbelts.  There are no firearms at home. There is no violence in the home.   There is no risks for hepatitis, STDs or HIV. There is no   history of blood transfusion. They have no travel history to infectious disease endemic areas of the world.  The patient has seen their dentist in the last six month. They have  Not  seen their eye doctor in the last year. They deny  hearing difficulty with regard to whispered voices and some television programs.  They have deferred audiologic testing in the last year.  They do not  have excessive sun exposure. Discussed the need for sun protection: hats, long sleeves and use of sunscreen if there is significant sun exposure.   Diet: the importance of a healthy diet is discussed. They do have a healthy diet.  The benefits of regular aerobic exercise were discussed. She Practices yoga 2 TO 3 TIMES PER WEEK and until recently was walking for 30 minutes on the other days   Depression screen: there are no signs or vegative symptoms of depression- irritability, change in appetite, anhedonia, sadness/tearfullness.  Cognitive assessment: the patient manages all their financial and personal affairs and is actively engaged. They could relate  day,date,year and events; recalled 2/3 objects at 3 minutes; performed clock-face test normally.  The following portions of the patient's history were reviewed and updated as appropriate: allergies, current medications, past family history, past medical history,  past surgical history, past social history  and problem list.  Visual acuity was not assessed per patient preference since she has regular follow up with her ophthalmologist. Hearing and body mass index were assessed and reviewed.   During the course of the visit the patient was educated and counseled about appropriate screening and preventive services including : fall prevention , diabetes screening, nutrition counseling, colorectal cancer screening, and recommended immunizations.    CC: The primary encounter diagnosis was Diabetes mellitus with no complication ([place]). Diagnoses of Pure hypercholesterolemia, Essential hypertension, History of right breast cancer, Crohn's disease of large intestine without complication ([place]), Generalized anxiety disorder, and Immunosuppression due to drug therapy were also pertinent to this visit.  1) HTN:  Stopped olmesartan due to constipation.   2) Headaches:   Thinks the atorvastation may also be causing new onset daily headaches, occurring at night,  At bedtime and in the morning upon waking.  Always involving the frontal and temporal regions bilaterally occasionally accompanied by nausea. Taking "Smooth Move" on a daily basis along with  BFL  2)  Has finished 5 years  Of anastrazole  last week   History Kiya has a past medical history of Arthritis, Breast cancer, right breast ([place]) (08/24/2012),  Crohn's disease ([place]), GERD (gastroesophageal reflux disease), History of blood transfusion (1960), Hypertension, and Personal history of radiation therapy.   She has a past surgical history that includes Dilation and curettage of uterus; Brain surgery (1980); Breast surgery (1980's); Breast lumpectomy  (Right, May 2014); Colonoscopy (2017); Breast biopsy (Right, 2014); and Breast biopsy (Bilateral).   Her family history includes Cancer in her paternal grandmother; Diabetes in her sister; Heart disease in her sister.She reports that she has never smoked. She has never used smokeless tobacco. She reports that she drinks about 1.0 standard drinks of alcohol per week. She reports that she does not use drugs.  Outpatient Medications Prior to Visit  Medication Sig Dispense Refill  . aspirin EC 81 MG tablet Take 81 mg by mouth daily.    Marland Kitchen atorvastatin (LIPITOR) 10 MG tablet Take 10 mg by mouth daily.  11  . calcium carbonate (TUMS - DOSED IN MG ELEMENTAL CALCIUM) 500 MG chewable tablet Chew 1 tablet by mouth daily.    . chlorthalidone (HYGROTON) 25 MG tablet Take 25 mg by mouth daily.  11  . cholecalciferol (VITAMIN D) 1000 units tablet Take 1,000 Units by mouth daily.    Marland Kitchen inFLIXimab (REMICADE) 100 MG injection Inject 1 mg into the vein every 8 (eight) weeks.    Marland Kitchen loratadine (CLARITIN) 10 MG tablet Take 10 mg by mouth daily as needed for allergies.    Marland Kitchen LORazepam (ATIVAN) 0.5 MG tablet TAKE 1 TABLET BY MOUTH EVERY 8 HOURS AS NEEDED FOR ANXIETY *MAXIMUM OF 2 TABLETS A DAY* 60 tablet 3  . nystatin (MYCOSTATIN) 100000 UNIT/ML suspension SWISH & SWALLOW 5 ML BY MOUTH 4 TIMES DAILY  1  . Omega-3 Fatty Acids (FISH OIL PO) Take by mouth.    . vitamin E 400 UNIT capsule Take by mouth.    . polyethylene glycol powder (GLYCOLAX/MIRALAX) powder Take by mouth.    . hydrochlorothiazide (MICROZIDE) 12.5 MG capsule Take 1 capsule (12.5 mg total) by mouth daily. (Patient not taking: Reported on 02/21/2018) 90 capsule 0  . sertraline (ZOLOFT) 50 MG tablet Take 1 tablet (50 mg total) by mouth at bedtime. (Patient not taking: Reported on 02/21/2018) 90 tablet 3   No facility-administered medications prior to visit.     Review of Systems   Patient denies  fevers, malaise, unintentional weight loss, skin rash, eye  pain, sinus congestion and sinus pain, sore throat, dysphagia,  hemoptysis , cough, dyspnea, wheezing, chest pain, palpitations, orthopnea, edema, abdominal pain, , melena, diarrhea, , flank pain, dysuria, hematuria, urinary  Frequency, nocturia, numbness, tingling, seizures,  Focal weakness, Loss of consciousness,  Tremor, and suicidal ideation.      Objective:  BP 132/76 (BP Location: Left Arm, Patient Position: Sitting, Cuff Size: Normal)   Pulse 89   Temp 98.6 F (37 C) (Oral)   Resp 14   Ht 5' 2"  (1.575 m)   Wt 138 lb 9.6 oz (62.9 kg)   SpO2 95%   BMI 25.35 kg/m   Physical Exam   General appearance: alert, cooperative and appears stated age Head: Normocephalic, without obvious abnormality, atraumatic Eyes: conjunctivae/corneas clear. PERRL, EOM's intact. Fundi benign. Ears: normal TM's and external ear canals both ears Nose: Nares normal. Septum midline. Mucosa normal. No drainage or sinus tenderness. Throat: lips, mucosa, and tongue normal; teeth and gums normal Neck: no adenopathy, no carotid bruit, no JVD, supple, symmetrical, trachea midline and thyroid not enlarged, symmetric, no tenderness/mass/nodules Lungs: clear to auscultation bilaterally Breasts: right breast  with horizontal lumpectomy scar above the nipple , normal appearance, no masses or tenderness Heart: regular rate and rhythm, S1, S2 normal, no murmur, click, rub or gallop Abdomen: soft, non-tender; bowel sounds normal; no masses,  no organomegaly Extremities: extremities normal, atraumatic, no cyanosis or edema Pulses: 2+ and symmetric Skin: Skin color, texture, turgor normal. No rashes or lesions Neurologic: Alert and oriented X 3, normal strength and tone. Normal symmetric reflexes. Normal coordination and gait.      Assessment & Plan:   Problem List Items Addressed This Visit    Crohn's disease ([place])    Managed with Remicaide infusions,  No recent flares.  Last colonoscopy 2017 at [place]        Diabetes mellitus with no complication ([place]) - Primary    Remains well-controlled on diet alone. Intolerant of ACE I due to cough.  Has stopped olmesartan due to perceived side effect of constipation ./ No proteinuria despite  Recurrent reports of "foamy urine.".  Foot exam normal .  Taking statin but may stop it if headaches continue.    Reduced  aspirin to once weekly due to nosebleeds .   Lab Results  Component Value Date   HGBA1C 6.5 12/10/2017   Lab Results  Component Value Date   MICROALBUR <0.7 12/10/2017     Lab Results  Component Value Date   CHOL 247 (H) 12/10/2017   HDL 66.00 12/10/2017   LDLCALC 166 (H) 12/10/2017   LDLDIRECT 151.5 10/03/2013   TRIG 75.0 12/10/2017   CHOLHDL 4 12/10/2017         Relevant Medications   atorvastatin (LIPITOR) 10 MG tablet   Other Relevant Orders   Hemoglobin A1c   Microalbumin / creatinine urine ratio   Generalized anxiety disorder    Stable .  Did not tolerate trial of Zoloft.  Prefers to continue prn use of lorazepam. Limited to 1 mg daily as needed.        History of right breast cancer    She has completed 5 yrs of anastrozole with no recurrence by mammogram June 2019 .  Follow up with Oncology       HLD (hyperlipidemia)    Not tolerating every other day atorvastatin due to recurrent headaches.   No changes today  Lab Results  Component Value Date   CHOL 247 (H) 12/10/2017   HDL 66.00 12/10/2017   LDLCALC 166 (H) 12/10/2017   LDLDIRECT 151.5 10/03/2013   TRIG 75.0 12/10/2017   CHOLHDL 4 12/10/2017   Lab Results  Component Value Date   ALT 11 12/10/2017   AST 15 12/10/2017   ALKPHOS 70 12/10/2017   BILITOT 0.8 12/10/2017         Relevant Medications   atorvastatin (LIPITOR) 10 MG tablet   chlorthalidone (HYGROTON) 25 MG tablet   Other Relevant Orders   Lipid panel   Hypertension   Relevant Medications   atorvastatin (LIPITOR) 10 MG tablet   chlorthalidone (HYGROTON) 25 MG tablet   Other Relevant Orders    Comprehensive metabolic panel   Immunosuppression due to drug therapy    Shigngrx vaccine recommended. Up to date with all others          I have discontinued Herbert Pun I. Grimes "AGGIE"'s polyethylene glycol powder, sertraline, and hydrochlorothiazide. I am also having her start on polyethylene glycol powder and Zoster Vaccine Adjuvanted. Additionally, I am having her maintain her inFLIXimab, aspirin EC, Omega-3 Fatty Acids (FISH OIL PO), vitamin E, cholecalciferol, LORazepam, loratadine, calcium carbonate,  atorvastatin, chlorthalidone, and nystatin.  Meds ordered this encounter  Medications  . polyethylene glycol powder (GLYCOLAX/MIRALAX) powder    Sig: Take 17 g by mouth 2 (two) times daily as needed.    Dispense:  3350 g    Refill:  11  . Zoster Vaccine Adjuvanted [facility]) injection    Sig: Inject 0.5 mLs into the muscle once for 1 dose.    Dispense:  1 each    Refill:  1   A total of 40 minutes was spent with patient more than half of which was spent in counseling patient on the above mentioned issues , reviewing and explaining recent labs and imaging studies done, and coordination of care.   Medications Discontinued During This Encounter  Medication Reason  . hydrochlorothiazide (MICROZIDE) 12.5 MG capsule Patient has not taken in last 30 days  . sertraline (ZOLOFT) 50 MG tablet Patient has not taken in last 30 days  . polyethylene glycol powder (GLYCOLAX/MIRALAX) powder     Follow-up: Return in about 4 months (around 06/24/2018) for follow up diabetes.   [name], MD

## 2018-02-21 NOTE — Assessment & Plan Note (Signed)
Remains well-controlled on diet alone. Intolerant of ACE I due to cough.  Has stopped olmesartan due to perceived side effect of constipation ./ No proteinuria despite  Recurrent reports of "foamy urine.".  Foot exam normal .  Taking statin but may stop it if headaches continue.    Reduced  aspirin to once weekly due to nosebleeds .   Lab Results  Component Value Date   HGBA1C 6.5 12/10/2017   Lab Results  Component Value Date   MICROALBUR <0.7 12/10/2017     Lab Results  Component Value Date   CHOL 247 (H) 12/10/2017   HDL 66.00 12/10/2017   LDLCALC 166 (H) 12/10/2017   LDLDIRECT 151.5 10/03/2013   TRIG 75.0 12/10/2017   CHOLHDL 4 12/10/2017

## 2018-02-21 NOTE — Assessment & Plan Note (Signed)
Managed with Remicaide infusions,  No recent flares.  Last colonoscopy 2017 at [facility]

## 2018-02-21 NOTE — Assessment & Plan Note (Addendum)
Stable .  Did not tolerate trial of Zoloft.  Prefers to continue prn use of lorazepam. Limited to 1 mg daily as needed.

## 2018-02-21 NOTE — Assessment & Plan Note (Signed)
Not tolerating every other day atorvastatin due to recurrent headaches.   No changes today  Lab Results  Component Value Date   CHOL 247 (H) 12/10/2017   HDL 66.00 12/10/2017   LDLCALC 166 (H) 12/10/2017   LDLDIRECT 151.5 10/03/2013   TRIG 75.0 12/10/2017   CHOLHDL 4 12/10/2017   Lab Results  Component Value Date   ALT 11 12/10/2017   AST 15 12/10/2017   ALKPHOS 70 12/10/2017   BILITOT 0.8 12/10/2017

## 2018-02-24 ENCOUNTER — Inpatient Hospital Stay: Payer: Medicare Other | Attending: Oncology | Admitting: Oncology

## 2018-02-24 ENCOUNTER — Other Ambulatory Visit: Payer: Self-pay

## 2018-02-24 ENCOUNTER — Encounter: Payer: Self-pay | Admitting: Oncology

## 2018-02-24 VITALS — BP 132/83 | HR 71 | Temp 98.6°F | Resp 12 | Ht 62.0 in | Wt 137.3 lb

## 2018-02-24 DIAGNOSIS — K5090 Crohn's disease, unspecified, without complications: Secondary | ICD-10-CM | POA: Diagnosis not present

## 2018-02-24 DIAGNOSIS — Z809 Family history of malignant neoplasm, unspecified: Secondary | ICD-10-CM | POA: Insufficient documentation

## 2018-02-24 DIAGNOSIS — M1990 Unspecified osteoarthritis, unspecified site: Secondary | ICD-10-CM | POA: Insufficient documentation

## 2018-02-24 DIAGNOSIS — C50911 Malignant neoplasm of unspecified site of right female breast: Secondary | ICD-10-CM

## 2018-02-24 DIAGNOSIS — M8580 Other specified disorders of bone density and structure, unspecified site: Secondary | ICD-10-CM | POA: Diagnosis not present

## 2018-02-24 DIAGNOSIS — Z79899 Other long term (current) drug therapy: Secondary | ICD-10-CM | POA: Diagnosis not present

## 2018-02-24 DIAGNOSIS — I10 Essential (primary) hypertension: Secondary | ICD-10-CM

## 2018-02-24 DIAGNOSIS — Z7982 Long term (current) use of aspirin: Secondary | ICD-10-CM | POA: Insufficient documentation

## 2018-02-24 DIAGNOSIS — K219 Gastro-esophageal reflux disease without esophagitis: Secondary | ICD-10-CM | POA: Diagnosis not present

## 2018-02-24 DIAGNOSIS — Z853 Personal history of malignant neoplasm of breast: Secondary | ICD-10-CM | POA: Insufficient documentation

## 2018-02-24 DIAGNOSIS — M129 Arthropathy, unspecified: Secondary | ICD-10-CM | POA: Diagnosis not present

## 2018-02-24 DIAGNOSIS — Z170 Estrogen receptor positive status [ER+]: Secondary | ICD-10-CM

## 2018-02-24 NOTE — Progress Notes (Signed)
Patient here for follow up. No changes since last appointment. Last breast exam last week by PCP.

## 2018-03-03 ENCOUNTER — Ambulatory Visit (INDEPENDENT_AMBULATORY_CARE_PROVIDER_SITE_OTHER): Payer: Medicare Other | Admitting: Family Medicine

## 2018-03-03 ENCOUNTER — Encounter: Payer: Self-pay | Admitting: Family Medicine

## 2018-03-03 VITALS — BP 142/86 | HR 76 | Temp 98.9°F | Ht 62.0 in | Wt 138.6 lb

## 2018-03-03 DIAGNOSIS — R059 Cough, unspecified: Secondary | ICD-10-CM

## 2018-03-03 DIAGNOSIS — R0989 Other specified symptoms and signs involving the circulatory and respiratory systems: Secondary | ICD-10-CM | POA: Diagnosis not present

## 2018-03-03 DIAGNOSIS — J988 Other specified respiratory disorders: Secondary | ICD-10-CM

## 2018-03-03 DIAGNOSIS — R05 Cough: Secondary | ICD-10-CM | POA: Diagnosis not present

## 2018-03-03 DIAGNOSIS — R0981 Nasal congestion: Secondary | ICD-10-CM

## 2018-03-03 MED ORDER — BENZONATATE 100 MG PO CAPS: 100.0000 mg | ORAL_CAPSULE | Freq: Three times a day (TID) | ORAL | 0 refills | Status: DC | PRN

## 2018-03-03 MED ORDER — AZITHROMYCIN 250 MG PO TABS: ORAL_TABLET | ORAL | 0 refills | Status: DC

## 2018-03-03 NOTE — Progress Notes (Signed)
   Subjective:    Patient ID: Kim Sanchez, female    DOB: April 29, 1941, 77 y.o.   MRN: [id number]  HPI   Presents to clinic c/o cough and sore throat, congestion in face and chest for over a week. States she has been using mucinex, allergy medications, delsym and coricidin without relief. Congestion has only seemed to worsen and settle in her chest. Denies shortness of breath or wheezing. Denies fever or chills.   Patient Active Problem List   Diagnosis Date Noted  . Muscle cramps 06/29/2017  . Insomnia due to stress 06/29/2017  . Foamy urine 06/29/2017  . Nipple pain 06/11/2017  . Pulmonary hypertension, mild ([place]) 08/17/2016  . Encounter for preventive health examination 04/04/2016  . Strain of right knee 02/21/2016  . History of right breast cancer 10/03/2014  . Immunosuppression due to drug therapy 09/15/2014  . Generalized anxiety disorder 06/08/2014  . 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 Diabetes mellitus with no complication ([place]) 20/80/2233  . Hypertension 05/05/2013  . Medicare annual wellness visit, subsequent 01/16/2013  . Weight gain 11/29/2011  . Arthritis   . GERD (gastroesophageal reflux disease)   . History of blood transfusion   . HLD (hyperlipidemia) 11/27/2011  . Crohn's disease ([place])    Social History   Tobacco Use  . Smoking status: Never Smoker  . Smokeless tobacco: Never Used  Substance Use Topics  . Alcohol use: Yes    Alcohol/week: 1.0 standard drinks    Types: 1 Glasses of wine per week    Comment: OCC   Review of Systems   Constitutional: Negative for chills, fatigue and fever.  HENT: +congestion, sinus drainage and sore throat.   Eyes: Negative.   Respiratory: +cough. Negative for shortness of breath and wheezing.   Cardiovascular: Negative for chest pain, palpitations and leg swelling.  Gastrointestinal: Negative for abdominal pain, diarrhea, nausea and vomiting.  Genitourinary: Negative for dysuria, frequency and urgency.    Musculoskeletal: Negative for arthralgias and myalgias.  Skin: Negative for color change, pallor and rash.  Neurological: Negative for syncope, light-headedness and headaches.  Psychiatric/Behavioral: The patient is not nervous/anxious.       Objective:   Physical Exam  Constitutional: She is oriented to person, place, and time. She appears well-nourished. No distress.  HENT:  Head: Normocephalic and atraumatic.  Eyes: Conjunctivae and EOM are normal. No scleral icterus.  Neck: Neck supple. No tracheal deviation present.  Cardiovascular: Normal rate and regular rhythm.  Pulmonary/Chest: Effort normal and breath sounds normal.  Musculoskeletal: She exhibits no edema.  Neurological: She is alert and oriented to person, place, and time.  Skin: Skin is warm and dry. No pallor.  Psychiatric: She has a normal mood and affect. Her behavior is normal.  Nursing note and vitals reviewed.     Vitals:   03/03/18 0900  BP: (!) 142/86  Pulse: 76  Temp: 98.9 F (37.2 C)  SpO2: 96%   Assessment & Plan:   Respiratory infection, cough, chest/nasal congestion -- Patient will take zpak. She will use tessalon perles as needed to calm cough. Advised to rest, increase fluids, do good handwashing.   Keep regular follow up as scheduled with PCP. Return to clinic sooner if issues arise/current symptoms persist or worsens.

## 2018-03-03 NOTE — Patient Instructions (Signed)

## 2018-03-14 ENCOUNTER — Other Ambulatory Visit (INDEPENDENT_AMBULATORY_CARE_PROVIDER_SITE_OTHER): Payer: Medicare Other

## 2018-03-14 DIAGNOSIS — E119 Type 2 diabetes mellitus without complications: Secondary | ICD-10-CM | POA: Diagnosis not present

## 2018-03-14 DIAGNOSIS — E7800 Pure hypercholesterolemia, unspecified: Secondary | ICD-10-CM | POA: Diagnosis not present

## 2018-03-14 DIAGNOSIS — I10 Essential (primary) hypertension: Secondary | ICD-10-CM

## 2018-03-14 LAB — MICROALBUMIN / CREATININE URINE RATIO
Creatinine,U: 162.3 mg/dL
MICROALB/CREAT RATIO: 0.8 mg/g (ref 0.0–30.0)
Microalb, Ur: 1.3 mg/dL (ref 0.0–1.9)

## 2018-03-14 LAB — COMPREHENSIVE METABOLIC PANEL
ALK PHOS: 81 U/L (ref 39–117)
ALT: 17 U/L (ref 0–35)
AST: 21 U/L (ref 0–37)
Albumin: 4.4 g/dL (ref 3.5–5.2)
BILIRUBIN TOTAL: 0.8 mg/dL (ref 0.2–1.2)
BUN: 14 mg/dL (ref 6–23)
CO2: 28 mEq/L (ref 19–32)
CREATININE: 0.82 mg/dL (ref 0.40–1.20)
Calcium: 9.5 mg/dL (ref 8.4–10.5)
Chloride: 94 mEq/L — ABNORMAL LOW (ref 96–112)
GFR: 71.87 mL/min (ref >60.00)
GLUCOSE: 120 mg/dL — AB (ref 70–99)
Potassium: 3.7 mEq/L (ref 3.5–5.1)
SODIUM: 132 meq/L — AB (ref 135–145)
TOTAL PROTEIN: 7.9 g/dL (ref 6.0–8.3)

## 2018-03-14 LAB — LIPID PANEL
CHOL/HDL RATIO: 2
Cholesterol: 177 mg/dL (ref 0–200)
HDL: 78.4 mg/dL (ref >39.00)
LDL Cholesterol: 80 mg/dL (ref 0–99)
NONHDL: 98.95
TRIGLYCERIDES: 93 mg/dL (ref 0.0–149.0)
VLDL: 18.6 mg/dL (ref 0.0–40.0)

## 2018-03-14 LAB — HEMOGLOBIN A1C: Hgb A1c MFr Bld: 6.5 % (ref 4.6–6.5)

## 2018-03-15 ENCOUNTER — Other Ambulatory Visit: Payer: Self-pay | Admitting: Internal Medicine

## 2018-03-15 ENCOUNTER — Ambulatory Visit
Admission: RE | Admit: 2018-03-15 | Discharge: 2018-03-15 | Disposition: A | Discharge status: Home / Self Care | Payer: Medicare Other | Source: Ambulatory Visit | Attending: Oncology | Admitting: Oncology

## 2018-03-15 DIAGNOSIS — Z13820 Encounter for screening for osteoporosis: Secondary | ICD-10-CM | POA: Diagnosis not present

## 2018-03-15 DIAGNOSIS — M85851 Other specified disorders of bone density and structure, right thigh: Secondary | ICD-10-CM | POA: Diagnosis not present

## 2018-03-15 DIAGNOSIS — M8588 Other specified disorders of bone density and structure, other site: Secondary | ICD-10-CM | POA: Diagnosis not present

## 2018-03-15 DIAGNOSIS — Z853 Personal history of malignant neoplasm of breast: Secondary | ICD-10-CM | POA: Diagnosis not present

## 2018-03-15 DIAGNOSIS — Z170 Estrogen receptor positive status [ER+]: Secondary | ICD-10-CM

## 2018-03-15 DIAGNOSIS — M8589 Other specified disorders of bone density and structure, multiple sites: Secondary | ICD-10-CM | POA: Diagnosis not present

## 2018-03-15 DIAGNOSIS — C50911 Malignant neoplasm of unspecified site of right female breast: Secondary | ICD-10-CM

## 2018-03-15 DIAGNOSIS — Z780 Asymptomatic menopausal state: Secondary | ICD-10-CM | POA: Diagnosis not present

## 2018-03-17 ENCOUNTER — Other Ambulatory Visit: Payer: Self-pay | Admitting: Internal Medicine

## 2018-03-17 DIAGNOSIS — E871 Hypo-osmolality and hyponatremia: Secondary | ICD-10-CM

## 2018-03-21 DIAGNOSIS — M8580 Other specified disorders of bone density and structure, unspecified site: Secondary | ICD-10-CM

## 2018-03-24 ENCOUNTER — Other Ambulatory Visit: Payer: Self-pay

## 2018-03-24 ENCOUNTER — Other Ambulatory Visit: Payer: Self-pay | Admitting: Internal Medicine

## 2018-03-28 DIAGNOSIS — M8580 Other specified disorders of bone density and structure, unspecified site: Secondary | ICD-10-CM | POA: Insufficient documentation

## 2018-03-28 NOTE — Assessment & Plan Note (Signed)
Mild,  With little change over the past 6 serial imaging studies.  (ordered by Dr Grayland Ormond)

## 2018-03-30 ENCOUNTER — Other Ambulatory Visit (INDEPENDENT_AMBULATORY_CARE_PROVIDER_SITE_OTHER): Payer: Medicare Other

## 2018-03-30 DIAGNOSIS — E871 Hypo-osmolality and hyponatremia: Secondary | ICD-10-CM | POA: Diagnosis not present

## 2018-03-30 LAB — BASIC METABOLIC PANEL
BUN: 12 mg/dL (ref 6–23)
CALCIUM: 9.6 mg/dL (ref 8.4–10.5)
CO2: 31 mEq/L (ref 19–32)
Chloride: 96 mEq/L (ref 96–112)
Creatinine, Ser: 0.76 mg/dL (ref 0.40–1.20)
GFR: 78.45 mL/min (ref >60.00)
Glucose, Bld: 118 mg/dL — ABNORMAL HIGH (ref 70–99)
Potassium: 3.7 mEq/L (ref 3.5–5.1)
Sodium: 135 mEq/L (ref 135–145)

## 2018-04-08 DIAGNOSIS — K5080 Crohn's disease of both small and large intestine without complications: Secondary | ICD-10-CM | POA: Diagnosis not present

## 2018-04-15 ENCOUNTER — Ambulatory Visit (INDEPENDENT_AMBULATORY_CARE_PROVIDER_SITE_OTHER): Payer: Medicare Other

## 2018-04-15 VITALS — BP 120/78 | HR 89 | Temp 98.1°F | Resp 15 | Ht 61.25 in | Wt 138.8 lb

## 2018-04-15 DIAGNOSIS — Z0000 Encounter for general adult medical examination without abnormal findings: Secondary | ICD-10-CM

## 2018-04-15 NOTE — Progress Notes (Addendum)
Subjective:   Kim Sanchez is a 77 y.o. female who presents for Medicare Annual (Subsequent) preventive examination.  Review of Systems:  No ROS.  Medicare Wellness Visit. Additional risk factors are reflected in the social history. Cardiac Risk Factors include: advanced age (>35mn, >>78women);hypertension;diabetes mellitus     Objective:     Vitals: BP 120/78 (BP Location: Left Arm, Patient Position: Sitting, Cuff Size: Normal)   Pulse 89   Temp 98.1 F (36.7 C) (Oral)   Resp 15   Ht 5' 1.25" (1.556 m)   Wt 138 lb 12.8 oz (63 kg)   SpO2 96%   BMI 26.01 kg/m   Body mass index is 26.01 kg/m.  Advanced Directives 04/15/2018 02/24/2018 08/23/2017 04/14/2017 03/04/2017 12/31/2016 08/10/2016  Does Patient Have a Medical Advance Directive? Yes Yes Yes Yes Yes Yes Yes  Type of AParamedicof AOleanLiving will HManteoLiving will HTigervilleLiving will HCitronelleLiving will - HLake AlfredLiving will H[place]veltLiving will  Does patient want to make changes to medical advance directive? No - Patient declined No - Patient declined No - Patient declined No - Patient declined - - No - Patient declined  Copy of [place] Chart? Yes - validated most recent copy scanned in chart (See row information) No - copy requested No - copy requested No - copy requested - - No - copy requested  Would patient like information on creating a medical advance directive? - - - - - - -    Tobacco Social History   Tobacco Use  Smoking Status Never Smoker  Smokeless Tobacco Never Used     Counseling given: Not Answered   Clinical Intake:  Pre-visit preparation completed: Yes  Pain : No/denies pain     Diabetes: Yes(Followed by pcp)  How often do you need to have someone help you when you read instructions, pamphlets, or other written materials from your doctor  or pharmacy?: 1 - Never  Interpreter Needed?: No     Past Medical History:  Diagnosis Date  . Arthritis   . 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  . Crohn's disease ([place]   . GERD (gastroesophageal reflux disease)   . History of blood transfusion 1960  . Hypertension   . Personal history of radiation therapy    Past Surgical History:  Procedure Laterality Date  . BRAIN SURGERY  1980   breast biopsies, benign  . 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 BREAST BIOPSY Bilateral    - core Bx- neg  . BREAST LUMPECTOMY Right May 2014   T1c, N0; ER/PR positive, HER-2/neu not overexpressing.  .Marland KitchenBREAST SURGERY  1980's   [place] . COLONOSCOPY  2017  . DILATION AND CURETTAGE OF UTERUS     Family History  Problem Relation Age of Onset  . Cancer Paternal Grandmother   . Heart disease Sister   . Diabetes Sister   . Breast cancer Neg Hx    Social History   Socioeconomic History  . Marital status: Married    Spouse name: Not on file  . Number of children: Not on file  . Years of education: Not on file  . Highest education level: Not on file  Occupational History  . Not on file  Social Needs  . Financial resource strain: Not hard at all  . Food insecurity:    Worry: Never true    Inability: Never true  . Transportation needs:    Medical: No    Non-medical: No  Tobacco Use  . Smoking status: Never Smoker  . Smokeless tobacco: Never Used  Substance and Sexual Activity  . Alcohol use: Yes    Alcohol/week: 1.0 standard drinks    Types: 1 Glasses of wine per week    Comment: OCC  . Drug use: No  . Sexual activity: Not Currently  Lifestyle  . Physical activity:    Days per week: Not on file    Minutes per session: Not on file  . Stress: Not on file  Relationships   . Social connections:    Talks on phone: Not on file    Gets together: Not on file    Attends religious service: Not on file    Active member of club or organization: Not on file    Attends meetings of clubs or organizations: Not on file    Relationship status: Not on file  Other Topics Concern  . Not on file  Social History Narrative  . Not on file    Outpatient Encounter Medications as of 04/15/2018  Medication Sig  . aspirin EC 81 MG tablet Take 81 mg by mouth daily.  Marland Kitchen atorvastatin (LIPITOR) 10 MG tablet Take 10 mg by mouth daily.  . benzonatate (TESSALON) 100 MG capsule Take 1 capsule (100 mg total) by mouth 3 (three) times daily as needed for cough.  . calcium carbonate (TUMS - DOSED IN MG ELEMENTAL CALCIUM) 500 MG chewable tablet Chew 1 tablet by mouth daily.  . chlorthalidone (HYGROTON) 25 MG tablet Take 25 mg by mouth daily.  . cholecalciferol (VITAMIN D) 1000 units tablet Take 1,000 Units by mouth daily.  Marland Kitchen inFLIXimab (REMICADE) 100 MG injection Inject 1 mg into the vein every 8 (eight) weeks.  Marland Kitchen loratadine (CLARITIN) 10 MG tablet TAKE 1 TABLET BY MOUTH ONCE DAILY  . LORazepam (ATIVAN) 0.5 MG tablet TAKE 1 TABLET BY MOUTH EVERY 8 HOURS AS NEEDED FOR ANXIETY (MAX  OF  TWO  TABLETS  PER  DAY)  . nystatin (MYCOSTATIN) 100000 UNIT/ML suspension SWISH & SWALLOW 5 ML BY MOUTH 4 TIMES DAILY  . Omega-3 Fatty Acids (FISH OIL PO) Take by mouth.  . polyethylene glycol powder (GLYCOLAX/MIRALAX) powder Take 17 g by mouth 2 (two) times daily as needed.  . vitamin E 400 UNIT capsule Take by mouth.  . [DISCONTINUED] azithromycin (ZITHROMAX) 250 MG tablet Take 2 tablets on day 1, take 1 tablet on days 2-5  . [DISCONTINUED] loratadine (CLARITIN) 10 MG tablet Take 10 mg by mouth daily as needed for allergies.  . [DISCONTINUED] losartan-hydrochlorothiazide (HYZAAR) 50-12.5 MG tablet Take 1 tablet by mouth daily.   No facility-administered encounter medications on file as of 04/15/2018.      Activities of Daily Living In your present state of health, do you have any difficulty performing the following activities: 04/15/2018  Hearing? N  Vision? N  Difficulty concentrating or making decisions? N  Walking or climbing stairs? N  Dressing or bathing? N  Doing errands, shopping? N  Preparing Food and eating ? N  Using the Toilet? N  In the past six months, have you accidently leaked urine? N  Do you have problems with loss of bowel control? N  Managing  your Medications? N  Managing your Finances? N  Housekeeping or managing your Housekeeping? N  Some recent data might be hidden    Patient Care Team: [name], MD as PCP - General (Internal Medicine) Bary Castilla, Forest Gleason, MD (General Surgery) [name], MD (Internal Medicine)    Assessment:   This is a routine wellness examination for [place].  Patient notes being more fatigued. 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 Appointment offered with her doctor for follow up, declined. She plans to keep her routine maintenance appointment in Feb and discuss at that time.  Plant based Ensure sample provided per her request to increase protein, supplement a meal/snack.  Health Screenings  Mammogram-10/05/17 Colonoscopy-10/08/15 Bone Density-03/15/18 Glaucoma-none reported Hearing-passes the whisper test Hemoglobin A1C-03/14/18 Cholesterol 03/14/18  Social  Alcohol intake-no Smoking history-no Smokers in home?none Illicit drug use?no Exercise-walking Diet-healthy; diabetic Sexually Active- not currently Multiple Partners-no  Safety  Patient feesl safe at home. Patient does have smoke detectors at home  Patient does wear sunscreen or protective clothing when in direct sunlight. Patient does wear seat belt when driving or riding with others.   Activities of Daily Living Patient can do their own household chores. Denies needing assistance with: driving, feeding  themselves, getting from bed to chair, getting to the toilet, bathing/showering, dressing, managing money, climbing flight of stairs, or preparing meals.   Depression Screen Patient denies losing interest in daily life.     Fall Screen Patient denies being afraid of falling or falling in the last year.   Memory Screen Patient denies problems with memory, misplacing items, and is able to balance checkbook/bank accounts.  Patient is alert, normal appearance, oriented to person/place/and time. Correctly identified the president of the [place], recall of 3 objects, and performing simple calculations.  Patient displays appropriate judgement and can read correct time from watch face.   Immunizations The following Immunizations are up to date: Influenza, shingles, pneumonia, and tetanus.   Other Providers Patient Care Team: [name], MD as PCP - General (Internal Medicine) Bary Castilla, Forest Gleason, MD (General Surgery) [name], MD (Internal Medicine)  Exercise Activities and Dietary recommendations Current Exercise Habits: Home exercise routine, Type of exercise: yoga;walking, Time (Minutes): 45, Frequency (Times/Week): 2, Weekly Exercise (Minutes/Week): 90, Intensity: Mild  Goals      Patient Stated   . DIET - INCREASE WATER INTAKE (pt-stated)       Fall Risk Fall Risk  04/15/2018 04/14/2017 01/06/2017 04/14/2016 02/06/2016  Falls in the past year? 0 No No No Yes  Number falls in past yr: - - - - 1  Injury with Fall? - - - - No   Depression Screen PHQ 2/9 Scores 04/15/2018 04/14/2017 01/06/2017 04/14/2016  PHQ - 2 Score 0 0 0 0     Cognitive Function MMSE - Mini Mental State Exam 04/14/2017 04/14/2016  Orientation to time 5 5  Orientation to Place 5 5  Registration 3 3  Attention/ Calculation 5 5  Recall 3 3  Language- name 2 objects 2 2  Language- repeat 1 1  Language- follow 3 step command 3 3  Language- read & follow direction 1 1  Write a sentence 1 1  Copy  design 1 1  Total score 30 30     6CIT Screen 04/15/2018  What Year? 0 points  What month? 0 points  What time? 0 points  Count back from 20 0 points  Months in  reverse 0 points  Repeat phrase 0 points  Total Score 0    Immunization History  Administered Date(s) Administered  . Influenza Split 02/01/2013  . Influenza Whole 01/17/2012  . Influenza, High Dose Seasonal PF 02/02/2016, 01/13/2017, 02/04/2018  . Influenza,inj,Quad PF,6+ Mos 03/20/2015  . Influenza-Unspecified 03/05/2014  . Pneumococcal Conjugate-13 07/24/2014, 10/06/2017  . Pneumococcal Polysaccharide-23 12/16/2012  . Td 03/19/2006  . Tdap 07/25/2015   Screening Tests Health Maintenance  Topic Date Due  . OPHTHALMOLOGY EXAM  09/01/2017  . HEMOGLOBIN A1C  09/12/2018  . FOOT EXAM  02/22/2019  . URINE MICROALBUMIN  03/15/2019  . TETANUS/TDAP  07/24/2025  . INFLUENZA VACCINE  Completed  . DEXA SCAN  Completed  . PNA vac Low Risk Adult  Completed      Plan:    End of life planning; Advance aging; Advanced directives discussed. Copy of current HCPOA/Living Will on file.    I have personally reviewed and noted the following in the patient's chart:   . Medical and social history . Use of alcohol, tobacco or illicit drugs  . Current medications and supplements . Functional ability and status . Nutritional status . Physical activity . Advanced directives . List of other physicians . Hospitalizations, surgeries, and ER visits in previous 12 months . Vitals . Screenings to include cognitive, depression, and falls . Referrals and appointments  In addition, I have reviewed and discussed with patient certain preventive protocols, quality metrics, and best practice recommendations. A written personalized care plan for preventive services as well as general preventive health recommendations were provided to patient.     OBrien-Blaney, Gwenette Wellons L, LPN  [id number]/49/[id number]     I have reviewed the above information and  agree with above.   Deborra Medina, MD

## 2018-04-15 NOTE — Patient Instructions (Addendum)
  Kim Sanchez , Thank you for taking time to come for your Medicare Wellness Visit. I appreciate your ongoing commitment to your health goals. Please review the following plan we discussed and let me know if I can assist you in the future.   These are the goals we discussed: Goals      Patient Stated   . DIET - INCREASE WATER INTAKE (pt-stated)       This is a list of the screening recommended for you and due dates:  Health Maintenance  Topic Date Due  . Eye exam for diabetics  09/01/2017  . Hemoglobin A1C  09/12/2018  . Complete foot exam   02/22/2019  . Urine Protein Check  03/15/2019  . Tetanus Vaccine  07/24/2025  . Flu Shot  Completed  . DEXA scan (bone density measurement)  Completed  . Pneumonia vaccines  Completed

## 2018-04-28 DIAGNOSIS — X32XXXA Exposure to sunlight, initial encounter: Secondary | ICD-10-CM | POA: Diagnosis not present

## 2018-04-28 DIAGNOSIS — L570 Actinic keratosis: Secondary | ICD-10-CM | POA: Diagnosis not present

## 2018-05-05 DIAGNOSIS — I10 Essential (primary) hypertension: Secondary | ICD-10-CM | POA: Diagnosis not present

## 2018-05-05 DIAGNOSIS — R0602 Shortness of breath: Secondary | ICD-10-CM | POA: Diagnosis not present

## 2018-05-05 DIAGNOSIS — E785 Hyperlipidemia, unspecified: Secondary | ICD-10-CM | POA: Diagnosis not present

## 2018-05-05 DIAGNOSIS — E119 Type 2 diabetes mellitus without complications: Secondary | ICD-10-CM | POA: Diagnosis not present

## 2018-05-12 ENCOUNTER — Encounter: Payer: Self-pay | Admitting: Family Medicine

## 2018-05-12 ENCOUNTER — Ambulatory Visit (INDEPENDENT_AMBULATORY_CARE_PROVIDER_SITE_OTHER): Payer: Medicare Other

## 2018-05-12 ENCOUNTER — Ambulatory Visit (INDEPENDENT_AMBULATORY_CARE_PROVIDER_SITE_OTHER): Payer: Medicare Other | Admitting: Family Medicine

## 2018-05-12 VITALS — BP 140/88 | HR 77 | Temp 98.3°F | Resp 18 | Ht 62.0 in | Wt 138.6 lb

## 2018-05-12 DIAGNOSIS — R05 Cough: Secondary | ICD-10-CM | POA: Diagnosis not present

## 2018-05-12 DIAGNOSIS — R059 Cough, unspecified: Secondary | ICD-10-CM

## 2018-05-12 DIAGNOSIS — J069 Acute upper respiratory infection, unspecified: Secondary | ICD-10-CM | POA: Diagnosis not present

## 2018-05-12 DIAGNOSIS — R0981 Nasal congestion: Secondary | ICD-10-CM

## 2018-05-12 DIAGNOSIS — J439 Emphysema, unspecified: Secondary | ICD-10-CM | POA: Diagnosis not present

## 2018-05-12 NOTE — Patient Instructions (Signed)
Keep using delsym syrup as needed for cough control  If having drainage/nasal congestion -- try Claritin, allegra or zyrtec, these are allergy medications and can help dry up congestion

## 2018-05-12 NOTE — Progress Notes (Signed)
Subjective:    Patient ID: Kim Sanchez, female    DOB: Aug 30, 1940, 78 y.o.   MRN: [id number]  HPI   Patient presents to clinic complaining of cough, nasal congestion, chest congestion for past week.  Patient states the mucus from nose and when she coughs is clear.  She has taken Tylenol, Delsym and some ibuprofen with good effect.  Patient states she actually started to be in to feel better over the past 2 days.  States her chest does not feel less congested this morning, and nose seems to be draining less today.  Denies fever or chills.  Denies nausea/vomiting or diarrhea.  Denies wheezing.  Denies chest pain.  Appetite has been normal.  Patient Active Problem List   Diagnosis Date Noted  . Osteopenia 03/28/2018  . Muscle cramps 06/29/2017  . Insomnia due to stress 06/29/2017  . Foamy urine 06/29/2017  . Nipple pain 06/11/2017  . Pulmonary hypertension, mild ([place]) 08/17/2016  . Encounter for preventive health examination 04/04/2016  . Strain of right knee 02/21/2016  . History of right breast cancer 10/03/2014  . Immunosuppression due to drug therapy 09/15/2014  . Generalized anxiety disorder 06/08/2014  . 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 Diabetes mellitus with no complication ([place]) 93/26/7124  . Hypertension 05/05/2013  . Medicare annual wellness visit, subsequent 01/16/2013  . Weight gain 11/29/2011  . Arthritis   . GERD (gastroesophageal reflux disease)   . History of blood transfusion   . HLD (hyperlipidemia) 11/27/2011  . Crohn's disease ([place])    Social History   Tobacco Use  . Smoking status: Never Smoker  . Smokeless tobacco: Never Used  Substance Use Topics  . Alcohol use: Yes    Alcohol/week: 1.0 standard drinks    Types: 1 Glasses of wine per week    Comment: OCC   Review of Systems  Constitutional: Negative for chills, fatigue and fever.  HENT: +congestion, runny nose -- seems better today Eyes: Negative.   Respiratory: +cough --  better last 2 days. Negative for shortness of breath and wheezing.   Cardiovascular: Negative for chest pain, palpitations and leg swelling.  Gastrointestinal: Negative for abdominal pain, diarrhea, nausea and vomiting.  Genitourinary: Negative for dysuria, frequency and urgency.  Musculoskeletal: Negative for arthralgias and myalgias.  Skin: Negative for color change, pallor and rash.  Neurological: Negative for syncope, light-headedness and headaches.  Psychiatric/Behavioral: The patient is not nervous/anxious.       Objective:   Physical Exam  Constitutional: She appears well-developed and well-nourished. No distress.  HENT:  Head: Normocephalic and atraumatic.  Nose/throat: Some clear nasal drainage and post nasal drip Eyes: Pupils are equal, round, and reactive to light. EOM are normal. No scleral icterus.  Neck: Normal range of motion. Neck supple. No tracheal deviation present.  Cardiovascular: Normal rate, regular rhythm and normal heart sounds.  Pulmonary/Chest: Effort normal and breath sounds normal. No respiratory distress. She has no wheezes. She has no rales.  Neurological: She is alert and oriented to person, place, and time.  Gait normal  Skin: Skin is warm and dry. No pallor.  Psychiatric: She has a normal mood and affect. Her behavior is normal.    Nursing note and vitals reviewed.  Vitals:   05/12/18 1125  BP: 140/88  Pulse: 77  Resp: 18  Temp: 98.3 F (36.8 C)  SpO2: 100%      Assessment & Plan:   Viral URI, cough, nasal congestion - since symptoms are  beginning to improve, I suspect they are related to a viral URI.  We will get chest x-ray in clinic to be sure no pneumonia is present.  Patient advised she may continue to use Delsym cough syrup as needed to help calm cough, also advised she can try taking a daily Claritin Allegra or Zyrtec if needed to help dry up nasal drainage/nasal congestion.  Advised this time of year especially it is important to keep  up with good handwashing, getting proper rest and keeping up good fluid intake.  Patient aware she will be contacted with results of chest x-ray.  Advised return to clinic if any issues arise, she will keep regularly scheduled follow-up with PCP.

## 2018-05-16 DIAGNOSIS — M9901 Segmental and somatic dysfunction of cervical region: Secondary | ICD-10-CM | POA: Diagnosis not present

## 2018-05-16 DIAGNOSIS — M62830 Muscle spasm of back: Secondary | ICD-10-CM | POA: Diagnosis not present

## 2018-05-16 DIAGNOSIS — M5412 Radiculopathy, cervical region: Secondary | ICD-10-CM | POA: Diagnosis not present

## 2018-05-16 DIAGNOSIS — M50322 Other cervical disc degeneration at C5-C6 level: Secondary | ICD-10-CM | POA: Diagnosis not present

## 2018-05-16 DIAGNOSIS — M5413 Radiculopathy, cervicothoracic region: Secondary | ICD-10-CM | POA: Diagnosis not present

## 2018-05-16 DIAGNOSIS — M7918 Myalgia, other site: Secondary | ICD-10-CM | POA: Diagnosis not present

## 2018-05-16 DIAGNOSIS — M542 Cervicalgia: Secondary | ICD-10-CM | POA: Diagnosis not present

## 2018-05-16 DIAGNOSIS — M50321 Other cervical disc degeneration at C4-C5 level: Secondary | ICD-10-CM | POA: Diagnosis not present

## 2018-05-16 DIAGNOSIS — M25512 Pain in left shoulder: Secondary | ICD-10-CM | POA: Diagnosis not present

## 2018-05-18 DIAGNOSIS — M7918 Myalgia, other site: Secondary | ICD-10-CM | POA: Diagnosis not present

## 2018-05-18 DIAGNOSIS — M50322 Other cervical disc degeneration at C5-C6 level: Secondary | ICD-10-CM | POA: Diagnosis not present

## 2018-05-18 DIAGNOSIS — M9901 Segmental and somatic dysfunction of cervical region: Secondary | ICD-10-CM | POA: Diagnosis not present

## 2018-05-18 DIAGNOSIS — M542 Cervicalgia: Secondary | ICD-10-CM | POA: Diagnosis not present

## 2018-05-18 DIAGNOSIS — M25512 Pain in left shoulder: Secondary | ICD-10-CM | POA: Diagnosis not present

## 2018-05-18 DIAGNOSIS — M5413 Radiculopathy, cervicothoracic region: Secondary | ICD-10-CM | POA: Diagnosis not present

## 2018-05-18 DIAGNOSIS — M5412 Radiculopathy, cervical region: Secondary | ICD-10-CM | POA: Diagnosis not present

## 2018-05-18 DIAGNOSIS — M62830 Muscle spasm of back: Secondary | ICD-10-CM | POA: Diagnosis not present

## 2018-05-18 DIAGNOSIS — M50321 Other cervical disc degeneration at C4-C5 level: Secondary | ICD-10-CM | POA: Diagnosis not present

## 2018-05-19 DIAGNOSIS — R0602 Shortness of breath: Secondary | ICD-10-CM | POA: Diagnosis not present

## 2018-06-03 DIAGNOSIS — K219 Gastro-esophageal reflux disease without esophagitis: Secondary | ICD-10-CM | POA: Diagnosis not present

## 2018-06-03 DIAGNOSIS — K5080 Crohn's disease of both small and large intestine without complications: Secondary | ICD-10-CM | POA: Diagnosis not present

## 2018-06-03 DIAGNOSIS — K5010 Crohn's disease of large intestine without complications: Secondary | ICD-10-CM | POA: Diagnosis not present

## 2018-06-13 ENCOUNTER — Other Ambulatory Visit: Payer: Self-pay | Admitting: Physical Medicine and Rehabilitation

## 2018-06-13 DIAGNOSIS — M5412 Radiculopathy, cervical region: Secondary | ICD-10-CM

## 2018-06-13 DIAGNOSIS — M5030 Other cervical disc degeneration, unspecified cervical region: Secondary | ICD-10-CM | POA: Diagnosis not present

## 2018-06-14 ENCOUNTER — Other Ambulatory Visit: Payer: Self-pay | Admitting: Internal Medicine

## 2018-06-15 NOTE — Telephone Encounter (Signed)
Frusemide is not in the pt's current medication list. Not sure if the pt should still be taking this or not.

## 2018-06-21 ENCOUNTER — Ambulatory Visit
Admission: RE | Admit: 2018-06-21 | Discharge: 2018-06-21 | Disposition: A | Discharge status: Home / Self Care | Payer: Medicare Other | Source: Ambulatory Visit | Attending: Physical Medicine and Rehabilitation | Admitting: Physical Medicine and Rehabilitation

## 2018-06-21 DIAGNOSIS — M542 Cervicalgia: Secondary | ICD-10-CM | POA: Diagnosis not present

## 2018-06-21 DIAGNOSIS — M5412 Radiculopathy, cervical region: Secondary | ICD-10-CM | POA: Diagnosis not present

## 2018-06-24 DIAGNOSIS — M5030 Other cervical disc degeneration, unspecified cervical region: Secondary | ICD-10-CM | POA: Diagnosis not present

## 2018-06-24 DIAGNOSIS — M5412 Radiculopathy, cervical region: Secondary | ICD-10-CM | POA: Diagnosis not present

## 2018-06-27 DIAGNOSIS — M5030 Other cervical disc degeneration, unspecified cervical region: Secondary | ICD-10-CM | POA: Diagnosis not present

## 2018-06-30 ENCOUNTER — Encounter: Payer: Self-pay | Admitting: Internal Medicine

## 2018-06-30 ENCOUNTER — Ambulatory Visit (INDEPENDENT_AMBULATORY_CARE_PROVIDER_SITE_OTHER): Payer: Medicare Other | Admitting: Internal Medicine

## 2018-06-30 VITALS — BP 128/82 | HR 85 | Temp 98.3°F | Resp 14 | Ht 61.0 in | Wt 137.8 lb

## 2018-06-30 DIAGNOSIS — F5102 Adjustment insomnia: Secondary | ICD-10-CM

## 2018-06-30 DIAGNOSIS — E782 Mixed hyperlipidemia: Secondary | ICD-10-CM | POA: Diagnosis not present

## 2018-06-30 DIAGNOSIS — E1169 Type 2 diabetes mellitus with other specified complication: Secondary | ICD-10-CM | POA: Diagnosis not present

## 2018-06-30 DIAGNOSIS — E559 Vitamin D deficiency, unspecified: Secondary | ICD-10-CM

## 2018-06-30 DIAGNOSIS — M8580 Other specified disorders of bone density and structure, unspecified site: Secondary | ICD-10-CM

## 2018-06-30 LAB — COMPREHENSIVE METABOLIC PANEL
ALK PHOS: 77 U/L (ref 39–117)
ALT: 17 U/L (ref 0–35)
AST: 19 U/L (ref 0–37)
Albumin: 4.4 g/dL (ref 3.5–5.2)
BUN: 14 mg/dL (ref 6–23)
CO2: 28 mEq/L (ref 19–32)
Calcium: 9.4 mg/dL (ref 8.4–10.5)
Chloride: 100 mEq/L (ref 96–112)
Creatinine, Ser: 0.74 mg/dL (ref 0.40–1.20)
GFR: 76.06 mL/min (ref >60.00)
Glucose, Bld: 113 mg/dL — ABNORMAL HIGH (ref 70–99)
Potassium: 3.9 mEq/L (ref 3.5–5.1)
Sodium: 137 mEq/L (ref 135–145)
Total Bilirubin: 0.9 mg/dL (ref 0.2–1.2)
Total Protein: 7.4 g/dL (ref 6.0–8.3)

## 2018-06-30 LAB — LIPID PANEL
Cholesterol: 208 mg/dL — ABNORMAL HIGH (ref 0–200)
HDL: 76.4 mg/dL (ref >39.00)
LDL Cholesterol: 109 mg/dL — ABNORMAL HIGH (ref 0–99)
NonHDL: 131.32
Total CHOL/HDL Ratio: 3
Triglycerides: 110 mg/dL (ref 0.0–149.0)
VLDL: 22 mg/dL (ref 0.0–40.0)

## 2018-06-30 LAB — MICROALBUMIN / CREATININE URINE RATIO
Creatinine,U: 96.8 mg/dL
Microalb Creat Ratio: 0.7 mg/g (ref 0.0–30.0)
Microalb, Ur: <0.7 mg/dL (ref 0.0–1.9)

## 2018-06-30 LAB — HEMOGLOBIN A1C: Hgb A1c MFr Bld: 6.5 % (ref 4.6–6.5)

## 2018-06-30 LAB — VITAMIN D 25 HYDROXY (VIT D DEFICIENCY, FRACTURES): VITD: 32.11 ng/mL (ref 30.00–100.00)

## 2018-06-30 MED ORDER — ZOSTER VAC RECOMB ADJUVANTED 50 MCG/0.5ML IM SUSR: 0.5000 mL | Freq: Once | INTRAMUSCULAR | 1 refills | Status: AC

## 2018-06-30 NOTE — Patient Instructions (Addendum)
I recommend trying melatonin for your insomnia.  It is not a sedative,  But must be taken on  a regular basis to help your internal clock.  Take every evening after dinner start with 3 mg dose   Max effective dose is 6 mg  You can also try using " Headspace".  This  is a phone app that teaches you to meditate and empty your mind of thoughts that interfere with space    I recommend getting the majority of your calcium and Vitamin D  through diet rather than supplements given the recent association of calcium supplements with increased coronary artery calcium scores.  You need 1200 mg calcium daily and 1000 IUs of Vit D (more if your level is low today) if you have osteopenia  Try using Tums,  Protein drinks (like Ensure,  Premier Protein, or Muscle Milk), soy,  almond and cashew milks which are fortified and carried by  most grocery stores   in the dairy  Section.   They are lactose free.  Soy milk should not be used if you have a history of breast cancer.   If you want to make your own protein drink, try the one made by  [facility] .  It comes in vanilla, chocolate and salted caramel ,  And is  plant based.  You can  Mix it with one of the non dairy milks,  And add PB2 peanut powder  (peanut butter 905 of the fat removed)  For a great breakfast

## 2018-06-30 NOTE — Progress Notes (Signed)
Subjective:  Patient ID: Kim Sanchez, female    DOB: 20-Sep-1940  Age: 78 y.o. MRN: [id number]  CC: The primary encounter diagnosis was Mixed hyperlipidemia. Diagnoses of DM type 2 with diabetic mixed hyperlipidemia ([place]), Vitamin D deficiency, Osteopenia, unspecified location, and Insomnia due to stress were also pertinent to this visit.  HPI Kim Sanchez presents for   4 month follow up on Type 2 diabetes with hyperlipidemia .   Patient is following a low glycemic index diet regularly without side effects.  Fasting sugars have been under less than 120 most of the time and post prandials have been under 160 except on rare occasions. Patient is exercising about 3 times per week and intentionally trying to lose weight .  Patient has had an eye exam in the last 12 months and checks feet regularly for signs of infection.  Patient does not walk barefoot outside,  And denies an numbness tingling or burning in feet. Patient is up to date on all recommended vaccinations  Patient has  Been having neck pain and had an MRI of her cervical spine by [facility],  Prescribed PT , 4th session tomorrow,  Using heat and tramadol ,  Neck exercises,  No TENS unit yet.   Cc: constant fatigue . Caregiver with responsibilities. Husband very stubborn , post cva.  Patient Takes lorazepam   Discussed DEXA  Results from November    Outpatient Medications Prior to Visit  Medication Sig Dispense Refill  . aspirin EC 81 MG tablet Take 81 mg by mouth daily.    Marland Kitchen atorvastatin (LIPITOR) 10 MG tablet Take 10 mg by mouth every other day.   11  . benzonatate (TESSALON) 100 MG capsule Take 1 capsule (100 mg total) by mouth 3 (three) times daily as needed for cough. 30 capsule 0  . calcium carbonate (TUMS - DOSED IN MG ELEMENTAL CALCIUM) 500 MG chewable tablet Chew 1 tablet by mouth daily.    . chlorthalidone (HYGROTON) 25 MG tablet Take 25 mg by mouth daily.   11  . cholecalciferol (VITAMIN D) 1000 units tablet Take 1,000 Units by  mouth daily.    . furosemide (LASIX) 20 MG tablet TAKE 1 TABLET BY MOUTH ONCE DAILY AS NEEDED FOR  FLUID  RETENTION 90 tablet 0  . inFLIXimab (REMICADE) 100 MG injection Inject 1 mg into the vein every 8 (eight) weeks.    Marland Kitchen loratadine (CLARITIN) 10 MG tablet TAKE 1 TABLET BY MOUTH ONCE DAILY 90 tablet 1  . LORazepam (ATIVAN) 0.5 MG tablet TAKE 1 TABLET BY MOUTH EVERY 8 HOURS AS NEEDED FOR ANXIETY (MAX  OF  TWO  TABLETS  PER  DAY) 60 tablet 3  . nystatin (MYCOSTATIN) 100000 UNIT/ML suspension SWISH & SWALLOW 5 ML BY MOUTH 4 TIMES DAILY  1  . Omega-3 Fatty Acids (FISH OIL PO) Take by mouth.    . polyethylene glycol powder (GLYCOLAX/MIRALAX) powder Take 17 g by mouth 2 (two) times daily as needed. 3350 g 11  . traMADol (ULTRAM) 50 MG tablet TAKE 1 2 TO 1 (ONE HALF TO ONE) TABLET BY MOUTH TWICE DAILY AS NEEDED    . vitamin E 400 UNIT capsule Take by mouth.     No facility-administered medications prior to visit.     Review of Systems;  Patient denies headache, fevers, malaise, unintentional weight loss, skin rash, eye pain, sinus congestion and sinus pain, sore throat, dysphagia,  hemoptysis , cough, dyspnea, wheezing, chest pain, palpitations, orthopnea, edema, abdominal  pain, nausea, melena, diarrhea, constipation, flank pain, dysuria, hematuria, urinary  Frequency, nocturia, numbness, tingling, seizures,  Focal weakness, Loss of consciousness,  Tremor, insomnia, depression, anxiety, and suicidal ideation.      Objective:  BP 128/82 (BP Location: Left Arm, Patient Position: Sitting, Cuff Size: Normal)   Pulse 85   Temp 98.3 F (36.8 C) (Oral)   Resp 14   Ht 5' 1"  (1.549 m)   Wt 137 lb 12.8 oz (62.5 kg)   SpO2 97%   BMI 26.04 kg/m   BP Readings from Last 3 Encounters:  06/30/18 128/82  05/12/18 140/88  04/15/18 120/78    Wt Readings from Last 3 Encounters:  06/30/18 137 lb 12.8 oz (62.5 kg)  05/12/18 138 lb 9.6 oz (62.9 kg)  04/15/18 138 lb 12.8 oz (63 kg)    General  appearance: alert, cooperative and appears stated age Ears: normal TM's and external ear canals both ears Throat: lips, mucosa, and tongue normal; teeth and gums normal Neck: no adenopathy, no carotid bruit, supple, symmetrical, trachea midline and thyroid not enlarged, symmetric, no tenderness/mass/nodules Back: symmetric, no curvature. ROM normal. No CVA tenderness. Lungs: clear to auscultation bilaterally Heart: regular rate and rhythm, S1, S2 normal, no murmur, click, rub or gallop Abdomen: soft, non-tender; bowel sounds normal; no masses,  no organomegaly Pulses: 2+ and symmetric Skin: Skin color, texture, turgor normal. No rashes or lesions Lymph nodes: Cervical, supraclavicular, and axillary nodes normal.  Lab Results  Component Value Date   HGBA1C 6.5 06/30/2018   HGBA1C 6.5 03/14/2018   HGBA1C 6.5 12/10/2017    Lab Results  Component Value Date   CREATININE 0.74 06/30/2018   CREATININE 0.76 03/30/2018   CREATININE 0.82 03/14/2018    Lab Results  Component Value Date   WBC 5.8 12/31/2016   HGB 13.5 12/31/2016   HCT 39.7 12/31/2016   PLT 215 12/31/2016   GLUCOSE 113 (H) 06/30/2018   CHOL 208 (H) 06/30/2018   TRIG 110.0 06/30/2018   HDL 76.40 06/30/2018   LDLDIRECT 151.5 10/03/2013   LDLCALC 109 (H) 06/30/2018   ALT 17 06/30/2018   AST 19 06/30/2018   NA 137 06/30/2018   K 3.9 06/30/2018   CL 100 06/30/2018   CREATININE 0.74 06/30/2018   BUN 14 06/30/2018   CO2 28 06/30/2018   TSH 2.39 09/30/2016   HGBA1C 6.5 06/30/2018   MICROALBUR <0.7 06/30/2018    Mr Cervical Spine Wo Contrast  Result Date: 06/21/2018 CLINICAL DATA:  Neck pain radiating into the left arm since December. EXAM: MRI CERVICAL SPINE WITHOUT CONTRAST TECHNIQUE: Multiplanar, multisequence MR imaging of the cervical spine was performed. No intravenous contrast was administered. COMPARISON:  None. FINDINGS: Alignment: Straightening of the normal cervical lordosis. Trace anterolisthesis at C2-C3  and C7-T1. Trace retrolisthesis at C3-C4. Vertebrae: No fracture, evidence of discitis, or bone lesion. Cord: Normal signal and morphology. Posterior Fossa, vertebral arteries, paraspinal tissues: Negative. Disc levels: C2-C3: Negative disc. Moderate right facet arthropathy. No stenosis. C3-C4: Posterior disc osteophyte complex and mild bilateral uncovertebral hypertrophy. Effacement of the ventral thecal sac with slight flattening of the ventral cord. Moderate bilateral neuroforaminal stenosis. No spinal canal stenosis. C4-C5: Partial ankylosis of the disc space with small posterior disc osteophyte complex. Ankylosis of the right facet joint. Mild left facet arthropathy. Effacement of the ventral thecal sac with slight flattening of the ventral cord. No stenosis. C5-C6: Minimal disc bulging and endplate spurring. Mild right facet arthropathy. Mild bilateral neuroforaminal stenosis. No spinal canal stenosis.  C6-C7: Posterior disc osteophyte complex eccentric to the left. Mild bilateral uncovertebral hypertrophy. Mild left neuroforaminal stenosis. No spinal canal or right neuroforaminal stenosis. C7-T1: Negative disc. Moderate bilateral facet arthropathy. No stenosis. IMPRESSION: 1. Multilevel cervical spondylosis as described above. Slight flattening of the ventral cord at C3-C4 and C4-C5 without significant spinal canal stenosis. 2. Moderate bilateral neuroforaminal stenosis at C3-C4. Electronically Signed   By: Titus Dubin M.D.   On: 06/21/2018 11:34    Assessment & Plan:   Problem List Items Addressed This Visit    Osteopenia    Mild,  With little change over the past 6 serial imaging studies.  (ordered by Dr Grayland Ormond)       Insomnia due to stress    Stable .  Did not tolerate trial of Zoloft.  Prefers to continue prn use of lorazepam. Limited to 1 mg daily as needed.  The risks and benefits of  Chronic  benzodiazepine use were discussed with patient today including increased risk of dementia,   Addiction, and seizures if abruptly withdrawn  .  Patient was encouraged to reduce use of clonazepam gradually,  Starting with reduction of one dose to 1/2 tablet and  Alternative sleep aids if needed  .       HLD (hyperlipidemia) - Primary   Relevant Orders   Lipid panel (Completed)   DM type 2 with diabetic mixed hyperlipidemia ([place])    Remains well-controlled on diet alone. Intolerant of ACE I due to cough.  Has stopped olmesartan due to perceived side effect of constipation ./ No proteinuria despite  Recurrent reports of "foamy urine.".  Foot exam normal .  Taking statin every other day .     Reduced  aspirin to once weekly due to nosebleeds .   Lab Results  Component Value Date   HGBA1C 6.5 06/30/2018   Lab Results  Component Value Date   MICROALBUR <0.7 06/30/2018     Lab Results  Component Value Date   CHOL 208 (H) 06/30/2018   HDL 76.40 06/30/2018   LDLCALC 109 (H) 06/30/2018   LDLDIRECT 151.5 10/03/2013   TRIG 110.0 06/30/2018   CHOLHDL 3 06/30/2018         Relevant Orders   Microalbumin / creatinine urine ratio (Completed)   Hemoglobin A1c (Completed)   Comprehensive metabolic panel (Completed)    Other Visit Diagnoses    Vitamin D deficiency       Relevant Orders   VITAMIN D 25 Hydroxy (Vit-D Deficiency, Fractures) (Completed)    A total of 25 minutes of face to face time was spent with patient more than half of which was spent in counselling about the above mentioned conditions  and coordination of care   I am having Kim Pun [place]. [place] "AGGIE" start on Zoster Vaccine Adjuvanted. I am also having her maintain her inFLIXimab, aspirin EC, Omega-3 Fatty Acids (FISH OIL PO), vitamin E, cholecalciferol, calcium carbonate, atorvastatin, chlorthalidone, nystatin, polyethylene glycol powder, benzonatate, LORazepam, loratadine, furosemide, and traMADol.  Meds ordered this encounter  Medications  . Zoster Vaccine Adjuvanted [facility]) injection    Sig: Inject 0.5 mLs into  the muscle once for 1 dose.    Dispense:  1 each    Refill:  1    There are no discontinued medications.  Follow-up: Return in about 4 months (around 10/29/2018) for follow up diabetes.   [name], MD

## 2018-07-01 DIAGNOSIS — M5030 Other cervical disc degeneration, unspecified cervical region: Secondary | ICD-10-CM | POA: Diagnosis not present

## 2018-07-02 NOTE — Assessment & Plan Note (Signed)
Remains well-controlled on diet alone. Intolerant of ACE I due to cough.  Has stopped olmesartan due to perceived side effect of constipation ./ No proteinuria despite  Recurrent reports of "foamy urine.".  Foot exam normal .  Taking statin every other day .     Reduced  aspirin to once weekly due to nosebleeds .   Lab Results  Component Value Date   HGBA1C 6.5 06/30/2018   Lab Results  Component Value Date   MICROALBUR <0.7 06/30/2018     Lab Results  Component Value Date   CHOL 208 (H) 06/30/2018   HDL 76.40 06/30/2018   LDLCALC 109 (H) 06/30/2018   LDLDIRECT 151.5 10/03/2013   TRIG 110.0 06/30/2018   CHOLHDL 3 06/30/2018

## 2018-07-02 NOTE — Assessment & Plan Note (Signed)
Mild,  With little change over the past 6 serial imaging studies.  (ordered by Dr Grayland Ormond)

## 2018-07-02 NOTE — Assessment & Plan Note (Signed)
Stable .  Did not tolerate trial of Zoloft.  Prefers to continue prn use of lorazepam. Limited to 1 mg daily as needed.  The risks and benefits of  Chronic  benzodiazepine use were discussed with patient today including increased risk of dementia,  Addiction, and seizures if abruptly withdrawn  .  Patient was encouraged to reduce use of clonazepam gradually,  Starting with reduction of one dose to 1/2 tablet and  Alternative sleep aids if needed  .

## 2018-07-04 DIAGNOSIS — M5030 Other cervical disc degeneration, unspecified cervical region: Secondary | ICD-10-CM | POA: Diagnosis not present

## 2018-07-07 DIAGNOSIS — M5030 Other cervical disc degeneration, unspecified cervical region: Secondary | ICD-10-CM | POA: Diagnosis not present

## 2018-07-29 DIAGNOSIS — K5080 Crohn's disease of both small and large intestine without complications: Secondary | ICD-10-CM | POA: Diagnosis not present

## 2018-08-12 ENCOUNTER — Other Ambulatory Visit: Payer: Self-pay | Admitting: Internal Medicine

## 2018-08-15 NOTE — Telephone Encounter (Signed)
Refilled: 03/16/2018 Last OV: 06/30/2018 Next OV: 04/20/2019

## 2018-08-18 DIAGNOSIS — R002 Palpitations: Secondary | ICD-10-CM | POA: Diagnosis not present

## 2018-08-18 DIAGNOSIS — E785 Hyperlipidemia, unspecified: Secondary | ICD-10-CM | POA: Diagnosis not present

## 2018-08-18 DIAGNOSIS — E119 Type 2 diabetes mellitus without complications: Secondary | ICD-10-CM | POA: Diagnosis not present

## 2018-08-18 DIAGNOSIS — I10 Essential (primary) hypertension: Secondary | ICD-10-CM | POA: Diagnosis not present

## 2018-09-22 ENCOUNTER — Other Ambulatory Visit: Payer: Self-pay | Admitting: Internal Medicine

## 2018-09-23 DIAGNOSIS — K5080 Crohn's disease of both small and large intestine without complications: Secondary | ICD-10-CM | POA: Diagnosis not present

## 2018-09-23 NOTE — Telephone Encounter (Signed)
Refilled: 08/15/2018 Last OV: 06/30/2018 Next OV: 04/20/2019

## 2018-09-30 ENCOUNTER — Encounter: Payer: Self-pay | Admitting: Internal Medicine

## 2018-09-30 ENCOUNTER — Other Ambulatory Visit: Payer: Self-pay

## 2018-09-30 ENCOUNTER — Ambulatory Visit (INDEPENDENT_AMBULATORY_CARE_PROVIDER_SITE_OTHER): Payer: Medicare Other | Admitting: Internal Medicine

## 2018-09-30 DIAGNOSIS — F5105 Insomnia due to other mental disorder: Secondary | ICD-10-CM | POA: Diagnosis not present

## 2018-09-30 DIAGNOSIS — N644 Mastodynia: Secondary | ICD-10-CM

## 2018-09-30 DIAGNOSIS — F411 Generalized anxiety disorder: Secondary | ICD-10-CM | POA: Diagnosis not present

## 2018-09-30 DIAGNOSIS — I10 Essential (primary) hypertension: Secondary | ICD-10-CM | POA: Diagnosis not present

## 2018-09-30 DIAGNOSIS — F409 Phobic anxiety disorder, unspecified: Secondary | ICD-10-CM | POA: Diagnosis not present

## 2018-09-30 MED ORDER — BUSPIRONE HCL 10 MG PO TABS: 10.0000 mg | ORAL_TABLET | Freq: Three times a day (TID) | ORAL | 1 refills | Status: DC

## 2018-09-30 NOTE — Patient Instructions (Signed)
I am prescribing buspirone to use for our anxiety  Start with one tablet daily after breakfast  You can take the second dose in the afternoon if needed instead of lorazepam  If you do not feel better in 30 minutes ,  You may take the lorazepam  The buspirone can be used up to 3 times daily   stay off the chlorthalidone and monitor your bp as you are   If you notice  consistent elevations to 140/80 or higher,  Notify me

## 2018-09-30 NOTE — Progress Notes (Signed)
There were no vitals taken for this visit.  General Appearance:    Alert, cooperative, no distress, appears stated age  Head:    Normocephalic, without obvious abnormality, atraumatic  Eyes:    PERRL, conjunctiva/corneas clear, EOM's intact, fundi    benign, both eyes  Ears:    Normal TM's and external ear canals, both ears  Nose:   Nares normal, septum midline, mucosa normal, no drainage    or sinus tenderness  Throat:   Lips, mucosa, and tongue normal; teeth and gums normal  Neck:   Supple, symmetrical, trachea midline, no adenopathy;    thyroid:  no enlargement/tenderness/nodules; no carotid   bruit or JVD  Back:     Symmetric, no curvature, ROM normal, no CVA tenderness  Lungs:     Clear to auscultation bilaterally, respirations unlabored  Chest Wall:    No tenderness or deformity   Heart:    Regular rate and rhythm, S1 and S2 normal, no murmur, rub   or gallop  Breast Exam:    No tenderness, masses, or nipple abnormality  Abdomen:     Soft, non-tender, bowel sounds active all four quadrants,    no masses, no organomegaly  Genitalia:    Normal female without lesion, discharge or tenderness  Rectal:    Normal tone, normal prostate, no masses or tenderness;   guaiac negative stool  Extremities:   Extremities normal, atraumatic, no cyanosis or edema  Pulses:   2+ and symmetric all extremities  Skin:   Skin color, texture, turgor normal, no rashes or lesions  Lymph nodes:   Cervical, supraclavicular, and axillary nodes normal  Neurologic:   CNII-XII intact, normal strength, sensation and reflexes    throughout  Telephone  Note  This visit type was conducted due to national recommendations for restrictions regarding the COVID-19 pandemic (e.g. social distancing).  This format is felt to be most appropriate for this patient at this time.  All issues noted in this document were discussed and addressed.  No physical exam was performed (except for noted visual exam findings with Video  Visits).   I connected with@ on 09/30/18 at 11:00 AM EDT by telephone and verified that I am speaking with the correct person using two identifiers. Location patient: home Location provider: work or home office Persons participating in the virtual visit: patient, provider  I discussed the limitations, risks, security and privacy concerns of performing an evaluation and management service by telephone and the availability of in person appointments. I also discussed with the patient that there may be a patient responsible charge related to this service. The patient expressed understanding and agreed to proceed.   Reason for visit: dry cough and congestion for 2 weeks   HPI:  78 yr old female with GAD, caregiver fatigue and hypertension with multiple intolerances. Presents for evaluation of cough. She has been having cough and PND for the last 2 weeks,  Also with  increased fatigue.  All symptoms improved overnight,  with use of Delsym started yesterday and suspension of chlorthalidone 2 days ago  Home morning BP readings 118/62 and 119/59 for the last 2 mornings (no significant change since stopping chlorthalidone .   The patient has no signs or symptoms of COVID 19 infection (fever, cough, sore throat  or shortness of breath beyond what is typical for patient).  Patient denies contact with other persons with the above mentioned symptoms or with anyone confirmed to have COVID 19    GAD: Using lorazepam  prn,  Anxiety is actually better lately. Prior intolerance to lexapro in 2018, concerned about reports of potential memory loss with continued use of lorazepam because her husband's [place] . Discussed risk with prn use and potential alternatives.  Including buspirone    ROS: See pertinent positives and negatives per HPI.  Past Medical History:  Diagnosis Date  . Arthritis   . Breast cancer, right breast ([place]) 08/24/2012   Histologic grade 1, invasive mammary carcinoma, no specific type.  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  . Crohn's disease ([place])   . GERD (gastroesophageal reflux disease)   . History of blood transfusion 1960  . Hypertension   . Personal history of radiation therapy     Past Surgical History:  Procedure Laterality Date  . BRAIN SURGERY  1980   breast biopsies, benign  . 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 BREAST BIOPSY Bilateral    - core Bx- neg  . BREAST LUMPECTOMY Right May 2014   T1c, N0; ER/PR positive, HER-2/neu not overexpressing.  Marland Kitchen BREAST SURGERY  1980's   fibro cyst both breast in [place]  . COLONOSCOPY  2017  . DILATION AND CURETTAGE OF UTERUS      Family History  Problem Relation Age of Onset  . Cancer Paternal Grandmother   . Heart disease Sister   . Diabetes Sister   . Breast cancer Neg Hx     SOCIAL HX:  reports that she has never smoked. She has never used smokeless tobacco. She reports current alcohol use of about 1.0 standard drinks of alcohol per week. She reports that she does not use drugs.   Current Outpatient Medications:  .  aspirin EC 81 MG tablet, Take 81 mg by mouth daily., Disp: , Rfl:  .  atorvastatin (LIPITOR) 10 MG tablet, Take 10 mg by mouth every other day. , Disp: , Rfl: 11 .  calcium carbonate (TUMS - DOSED IN MG ELEMENTAL CALCIUM) 500 MG chewable tablet, Chew 1 tablet by mouth daily., Disp: , Rfl:  .  chlorthalidone (HYGROTON) 25 MG tablet, Take 25 mg by mouth daily. , Disp: , Rfl: 11 .  cholecalciferol (VITAMIN D) 1000 units tablet, Take 1,000 Units by mouth daily., Disp: , Rfl:  .  furosemide (LASIX) 20 MG tablet, TAKE 1 TABLET BY MOUTH ONCE DAILY AS NEEDED FOR  FLUID  RETENTION, Disp: 90 tablet, Rfl: 0 .  inFLIXimab (REMICADE) 100 MG injection, Inject 1 mg into the vein every 8 (eight) weeks., Disp: , Rfl:  .  loratadine (CLARITIN) 10 MG tablet, TAKE 1 TABLET BY MOUTH ONCE DAILY, Disp: 90 tablet, Rfl: 1 .  LORazepam (ATIVAN)  0.5 MG tablet, TAKE 1 TABLET BY MOUTH EVERY 8 HOURS AS NEEDED FOR ANXIETY *MAX OF 2 TABLETS PER DAY*, Disp: 60 tablet, Rfl: 5 .  nystatin (MYCOSTATIN) 100000 UNIT/ML suspension, SWISH & SWALLOW 5 ML BY MOUTH 4 TIMES DAILY, Disp: , Rfl: 1 .  Omega-3 Fatty Acids (FISH OIL PO), Take by mouth., Disp: , Rfl:  .  polyethylene glycol powder (GLYCOLAX/MIRALAX) powder, Take 17 g by mouth 2 (two) times daily as needed., Disp: 3350 g, Rfl: 11 .  vitamin E 400 UNIT capsule, Take by mouth., Disp: , Rfl:  .  busPIRone (BUSPAR) 10 MG tablet, Take 1 tablet (10 mg total) by mouth 3 (three) times daily. As needed for anxiety, Disp: 90 tablet, Rfl: 1  EXAM:   General impression:  alert, cooperative and articulate.  No signs of being in distress  Lungs: speech is fluent sentence length suggests that patient is not short of breath and not punctuated by cough, sneezing or sniffing. Marland Kitchen   Psych: affect normal.  speech is articulate and non pressured .  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  Did not tolerate trial of Zoloft.   The risks and benefits of  Chronic  benzodiazepine use were discussed with patient today including increased risk of dementia,  Addiction, and seizures if abruptly withdrawn  .  Patient was encouraged to reduce use of lorazepam gradually,    Generalized anxiety disorder Adding buspirone for regular use to minimize use of lorazepam   Essential hypertension Chlorthalidone suspend 48 hours ago by patient with good results .  advised to continue current regimen    I discussed the assessment and treatment plan with the patient. The patient was provided an opportunity to ask questions and all were answered. The patient agreed with the plan and demonstrated an  understanding of the instructions.   The patient was advised to call back or seek an in-person evaluation if the symptoms worsen or if the condition fails to improve as anticipated.  I provided 25 minutes of non-face-to-face time during this encounter.   [name], MD

## 2018-10-02 NOTE — Assessment & Plan Note (Addendum)
Adding buspirone for regular use to minimize use of lorazepam

## 2018-10-02 NOTE — Assessment & Plan Note (Addendum)
Stable, managed with prn use of lorazepam.  Did not tolerate trial of Zoloft.   The risks and benefits of  Chronic  benzodiazepine use were discussed with patient today including increased risk of dementia,  Addiction, and seizures if abruptly withdrawn  .  Patient was encouraged to reduce use of lorazepam gradually,

## 2018-10-02 NOTE — Assessment & Plan Note (Signed)
Chlorthalidone suspend 48 hours ago by patient with good results .  advised to continue current regimen

## 2018-11-18 DIAGNOSIS — K5080 Crohn's disease of both small and large intestine without complications: Secondary | ICD-10-CM | POA: Diagnosis not present

## 2018-11-18 DIAGNOSIS — K5900 Constipation, unspecified: Secondary | ICD-10-CM | POA: Diagnosis not present

## 2018-11-29 ENCOUNTER — Ambulatory Visit
Admission: RE | Admit: 2018-11-29 | Discharge: 2018-11-29 | Disposition: A | Discharge status: Home / Self Care | Payer: Medicare Other | Source: Ambulatory Visit | Attending: Oncology | Admitting: Oncology

## 2018-11-29 ENCOUNTER — Other Ambulatory Visit: Payer: Self-pay

## 2018-11-29 DIAGNOSIS — Z170 Estrogen receptor positive status [ER+]: Secondary | ICD-10-CM | POA: Insufficient documentation

## 2018-11-29 DIAGNOSIS — C50911 Malignant neoplasm of unspecified site of right female breast: Secondary | ICD-10-CM | POA: Insufficient documentation

## 2018-11-29 DIAGNOSIS — Z1231 Encounter for screening mammogram for malignant neoplasm of breast: Secondary | ICD-10-CM | POA: Diagnosis not present

## 2018-11-29 HISTORY — DX: Malignant neoplasm of unspecified site of unspecified female breast: C50.919

## 2018-12-08 ENCOUNTER — Encounter: Payer: Self-pay | Admitting: General Surgery

## 2018-12-08 ENCOUNTER — Ambulatory Visit: Payer: Medicare Other | Admitting: General Surgery

## 2018-12-14 ENCOUNTER — Other Ambulatory Visit: Payer: Self-pay

## 2018-12-14 ENCOUNTER — Encounter: Payer: Self-pay | Admitting: Surgery

## 2018-12-14 ENCOUNTER — Ambulatory Visit (INDEPENDENT_AMBULATORY_CARE_PROVIDER_SITE_OTHER): Payer: Medicare Other | Admitting: Surgery

## 2018-12-14 VITALS — BP 180/78 | HR 70 | Temp 97.9°F | Resp 18 | Ht 61.5 in | Wt 148.8 lb

## 2018-12-14 DIAGNOSIS — Z170 Estrogen receptor positive status [ER+]: Secondary | ICD-10-CM

## 2018-12-14 DIAGNOSIS — C50811 Malignant neoplasm of overlapping sites of right female breast: Secondary | ICD-10-CM | POA: Diagnosis not present

## 2018-12-14 NOTE — Progress Notes (Signed)
12/14/2018  History of Present Illness: Kim Sanchez is a 78 y.o. female presenting for follow up.  She is s/p right breast lumpectomy and SLNBx in 09/2012.  She has had regular mammograms and most recently had mammogram on 11/29/18.  She denies any new masses, skin changes, nipple changes or drainage.  Reports self breast exams and mentions that is how she actually found the mass that was eventually resected in 2014.  Denies any weight loss, bony pain.  Past Medical History: Past Medical History:  Diagnosis Date  . Arthritis   . Breast cancer (Kim Sanchez)   . Breast cancer, right breast (Kim Sanchez) 08/24/2012   Histologic grade 1, invasive mammary carcinoma, no specific type. 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  . Crohn's disease (Kim Sanchez)   . GERD (gastroesophageal reflux disease)   . History of blood transfusion 1960  . Hypertension   . Personal history of radiation therapy      Past Surgical History: Past Surgical History:  Procedure Laterality Date  . BRAIN SURGERY  1980   breast biopsies, benign  . 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 BREAST BIOPSY Bilateral    - core Bx- neg  . BREAST LUMPECTOMY Right May 2014   T1c, N0; ER/PR positive, HER-2/neu not overexpressing.  Marland Kitchen BREAST SURGERY  1980's   fibro cyst both breast in [place]  . COLONOSCOPY  2017  . DILATION AND CURETTAGE OF UTERUS      Home Medications: Prior to Admission medications   Medication Sig Start Date End Date Taking? Authorizing Provider  aspirin EC 81 MG tablet Take 81 mg by mouth daily.   Yes [provider]  atorvastatin (LIPITOR) 10 MG tablet Take 10 mg by mouth every other day.  02/11/18  Yes [provider]  calcium carbonate (TUMS - DOSED IN MG ELEMENTAL CALCIUM) 500 MG chewable tablet Chew 1 tablet by mouth daily.   Yes [provider]  cholecalciferol (VITAMIN D) 1000 units tablet Take 1,000 Units by mouth daily.    Yes [provider]  furosemide (LASIX) 20 MG tablet TAKE 1 TABLET BY MOUTH ONCE DAILY AS NEEDED FOR  FLUID  RETENTION 06/15/18  Yes [name], MD  inFLIXimab (REMICADE) 100 MG injection Inject 1 mg into the vein every 8 (eight) weeks.   Yes [provider]  loratadine (CLARITIN) 10 MG tablet TAKE 1 TABLET BY MOUTH ONCE DAILY 03/24/18  Yes [name], MD  LORazepam (ATIVAN) 0.5 MG tablet TAKE 1 TABLET BY MOUTH EVERY 8 HOURS AS NEEDED FOR ANXIETY *MAX OF 2 TABLETS PER DAY* 09/23/18  Yes [name], MD  nystatin (MYCOSTATIN) 100000 UNIT/ML suspension SWISH & SWALLOW 5 ML BY MOUTH 4 TIMES DAILY 01/07/18  Yes [provider]  Omega-3 Fatty Acids (FISH OIL PO) Take by mouth.   Yes [provider]  polyethylene glycol powder (GLYCOLAX/MIRALAX) powder Take 17 g by mouth 2 (two) times daily as needed. 02/21/18  Yes [name], MD  vitamin E 400 UNIT capsule Take by mouth.   Yes [provider]  losartan-hydrochlorothiazide (HYZAAR) 50-12.5 MG tablet Take 1 tablet by mouth daily. 02/06/16 02/21/16  Burnard Hawthorne, FNP    Allergies: Allergies  Allergen Reactions  . Letrozole Anaphylaxis and Itching  . Methocarbamol Shortness Of Breath and Swelling  . Amlodipine Itching  . Exemestane Nausea Only  . Hydrochlorothiazide Other (See Comments)  . Lexapro [Escitalopram  Oxalate] Other (See Comments)    intolerance  . Lisinopril Cough  . Sulfa Antibiotics Other (See Comments)    Review of Systems: Review of Systems  Constitutional: Negative for chills and fever.  Respiratory: Negative for shortness of breath.   Cardiovascular: Negative for chest pain.  Gastrointestinal: Negative for nausea and vomiting.  Skin: Negative for rash.    Physical Exam BP (!) 180/78   Pulse 70   Temp 97.9 F (36.6 C) (Temporal)   Resp 18   Ht 5' 1.5" (1.562 m)   Wt 148 lb 12.8 oz (67.5 kg)   SpO2 96%   BMI 27.66 kg/m  CONSTITUTIONAL: No acute  distress HEENT:  Normocephalic, atraumatic, extraocular motion intact. RESPIRATORY:  Lungs are clear, and breath sounds are equal bilaterally. Normal respiratory effort without pathologic use of accessory muscles. CARDIOVASCULAR: Heart is regular without murmurs, gallops, or rubs. BREAST:  Right breast and axillary scars well healed.  There are no palpable masses, no skin changes, no nipple retraction, no drainage.  Left breast exam negative for any masses, skin changes, nipple retraction, or drainage.  Bilateral axillary exam reveals no lymphadenopathy.  Bilateral supraclavicular exam reveals no lymphadenopathy. GI: The abdomen is soft, non-distended, non-tender.  NEUROLOGIC:  Motor and sensation is grossly normal.  Cranial nerves are grossly intact. PSYCH:  Alert and oriented to person, place and time. Affect is normal.  Labs/Imaging: Mammogram 11/29/18: FINDINGS: There are no findings suspicious for malignancy. Stable postsurgical changes in bilateral breasts. Images were processed with CAD.  IMPRESSION: No mammographic evidence of malignancy. A result letter of this screening mammogram will be mailed directly to the patient.   Assessment and Plan: This is a 78 y.o. female s/p right breast lumpectomy and SLNBx in 2014.   Discussed with the patient that she has been doing very well and the last few mammograms have not found any evidence of suspicious lesions.  At this point, it is perfectly reasonable to transition her back to her PCP for further yearly screening mammograms.  Reassured the patient that we will always be available if any issues come up or if she has any questions.  Follow up with [place] prn  Face-to-face time spent with the patient and care providers was 15 minutes, with more than 50% of the time spent counseling, educating, and coordinating care of the patient.     Melvyn Neth, [facility]

## 2018-12-14 NOTE — Patient Instructions (Addendum)
You may follow up with your Primary care physician for  yearly mammograms.    Breast Self-Awareness Breast self-awareness is knowing how your breasts look and feel. Doing breast self-awareness is important. It allows you to catch a breast problem early while it is still small and can be treated. All women should do breast self-awareness, including women who have had breast implants. Tell your doctor if you notice a change in your breasts. What you need:  A mirror.  A well-lit room. How to do a breast self-exam A breast self-exam is one way to learn what is normal for your breasts and to check for changes. To do a breast self-exam: Look for changes  1. Take off all the clothes above your waist. 2. Stand in front of a mirror in a room with good lighting. 3. Put your hands on your hips. 4. Push your hands down. 5. Look at your breasts and nipples in the mirror to see if one breast or nipple looks different from the other. Check to see if: ? The shape of one breast is different. ? The size of one breast is different. ? There are wrinkles, dips, and bumps in one breast and not the other. 6. Look at each breast for changes in the skin, such as: ? Redness. ? Scaly areas. 7. Look for changes in your nipples, such as: ? Liquid around the nipples. ? Bleeding. ? Dimpling. ? Redness. ? A change in where the nipples are. Feel for changes  1. Lie on your back on the floor. 2. Feel each breast. To do this, follow these steps: ? Pick a breast to feel. ? Put the arm closest to that breast above your head. ? Use your other arm to feel the nipple area of your breast. Feel the area with the pads of your three middle fingers by making small circles with your fingers. For the first circle, press lightly. For the second circle, press harder. For the third circle, press even harder. ? Keep making circles with your fingers at the different pressures as you move down your breast. Stop when you feel your  ribs. ? Move your fingers a little toward the center of your body. ? Start making circles with your fingers again, this time going up until you reach your collarbone. ? Keep making up-and-down circles until you reach your armpit. Remember to keep using the three pressures. ? Feel the other breast in the same way. 3. Sit or stand in the tub or shower. 4. With soapy water on your skin, feel each breast the same way you did in step 2 when you were lying on the floor. Write down what you find Writing down what you find can help you remember what to tell your doctor. Write down:  What is normal for each breast.  Any changes you find in each breast, including: ? The kind of changes you find. ? Whether you have pain. ? Size and location of any lumps.  When you last had your menstrual period. General tips  Check your breasts every month.  If you are breastfeeding, the best time to check your breasts is after you feed your baby or after you use a breast pump.  If you get menstrual periods, the best time to check your breasts is 5-7 days after your menstrual period is over.  With time, you will become comfortable with the self-exam, and you will begin to know if there are changes in your breasts. Contact a  doctor if you:  See a change in the shape or size of your breasts or nipples.  See a change in the skin of your breast or nipples, such as red or scaly skin.  Have fluid coming from your nipples that is not normal.  Find a lump or thick area that was not there before.  Have pain in your breasts.  Have any concerns about your breast health. Summary  Breast self-awareness includes looking for changes in your breasts, as well as feeling for changes within your breasts.  Breast self-awareness should be done in front of a mirror in a well-lit room.  You should check your breasts every month. If you get menstrual periods, the best time to check your breasts is 5-7 days after your  menstrual period is over.  Let your doctor know of any changes you see in your breasts, including changes in size, changes on the skin, pain or tenderness, or fluid from your nipples that is not normal. This information is not intended to replace advice given to you by your health care provider. Make sure you discuss any questions you have with your health care provider. Document Released: 10/07/2007 Document Revised: 12/07/2017 Document Reviewed: 12/07/2017 Elsevier Patient Education  2020 [employer].

## 2018-12-29 DIAGNOSIS — D2262 Melanocytic nevi of left upper limb, including shoulder: Secondary | ICD-10-CM | POA: Diagnosis not present

## 2018-12-29 DIAGNOSIS — D2261 Melanocytic nevi of right upper limb, including shoulder: Secondary | ICD-10-CM | POA: Diagnosis not present

## 2018-12-29 DIAGNOSIS — D2271 Melanocytic nevi of right lower limb, including hip: Secondary | ICD-10-CM | POA: Diagnosis not present

## 2018-12-29 DIAGNOSIS — D2272 Melanocytic nevi of left lower limb, including hip: Secondary | ICD-10-CM | POA: Diagnosis not present

## 2018-12-29 DIAGNOSIS — L821 Other seborrheic keratosis: Secondary | ICD-10-CM | POA: Diagnosis not present

## 2018-12-29 DIAGNOSIS — D225 Melanocytic nevi of trunk: Secondary | ICD-10-CM | POA: Diagnosis not present

## 2019-01-04 DIAGNOSIS — H2513 Age-related nuclear cataract, bilateral: Secondary | ICD-10-CM | POA: Diagnosis not present

## 2019-01-13 DIAGNOSIS — K5080 Crohn's disease of both small and large intestine without complications: Secondary | ICD-10-CM | POA: Diagnosis not present

## 2019-02-06 DIAGNOSIS — Z23 Encounter for immunization: Secondary | ICD-10-CM | POA: Diagnosis not present

## 2019-02-08 ENCOUNTER — Other Ambulatory Visit: Payer: Self-pay | Admitting: Internal Medicine

## 2019-02-27 ENCOUNTER — Inpatient Hospital Stay: Payer: Medicare Other | Admitting: Oncology

## 2019-03-03 ENCOUNTER — Ambulatory Visit: Payer: Self-pay | Admitting: *Deleted

## 2019-03-03 NOTE — Telephone Encounter (Signed)
Tell her to resume the chlorthalidone that was suspended in may . Refill if needed

## 2019-03-03 NOTE — Telephone Encounter (Signed)
Summary: elevated BP   Pt was concerned about her BP being slightly elevated. Pt states she was told to call in if it was outside 140/80 range and today's AM reading was 157/76. Please call pt to advise.   [phone]      Call to patient- patient was taken off BP medication for low BP and given perimeters for calling back. She is experiencing a rise in her BP and wants PCP to know. Patient also is having some flank pain - she may have strained her back lifting heavy objects. Attempted to call office- x2- constant ring- message sent for review and scheduling- PCP does not have opening until Wednesday- which is outside disposition. Reason for Disposition . Systolic BP  >= 403 OR Diastolic >= 474 . [1] Age > 53 AND [2] no history of prior similar back pain  Answer Assessment - Initial Assessment Questions 1. BLOOD PRESSURE: "What is the blood pressure?" "Did you take at least two measurements 5 minutes apart?"     157/76, 167/86 P 67 2. ONSET: "When did you take your blood pressure?"     9:10 3. HOW: "How did you obtain the blood pressure?" (e.g., visiting nurse, automatic home BP monitor)     Automatic home cuuf 4. HISTORY: "Do you have a history of high blood pressure?"     Yes- it has been up and down 5. MEDICATIONS: "Are you taking any medications for blood pressure?" "Have you missed any doses recently?"     No medication at this time- 2-3 months off medication 6. OTHER SYMPTOMS: "Do you have any symptoms?" (e.g., headache, chest pain, blurred vision, difficulty breathing, weakness)     Mild nausea- relates to neck problem- comes and goes 7. PREGNANCY: "Is there any chance you are pregnant?" "When was your last menstrual period?"     n/a  Answer Assessment - Initial Assessment Questions 1. ONSET: "When did the pain begin?"      Lower left side 2. LOCATION: "Where does it hurt?" (upper, mid or lower back)     Lower back 3. SEVERITY: "How bad is the pain?"  (e.g., Scale 1-10;  mild, moderate, or severe)   - MILD (1-3): doesn't interfere with normal activities    - MODERATE (4-7): interferes with normal activities or awakens from sleep    - SEVERE (8-10): excruciating pain, unable to do any normal activities      mild 4. PATTERN: "Is the pain constant?" (e.g., yes, no; constant, intermittent)      No- not all the time 5. RADIATION: "Does the pain shoot into your legs or elsewhere?"     no 6. CAUSE:  "What do you think is causing the back pain?"      Possible UTI 7. BACK OVERUSE:  "Any recent lifting of heavy objects, strenuous work or exercise?"     Possible- heavy lifting the other day 8. MEDICATIONS: "What have you taken so far for the pain?" (e.g., nothing, acetaminophen, NSAIDS)     Not bad enough for medixation 9. NEUROLOGIC SYMPTOMS: "Do you have any weakness, numbness, or problems with bowel/bladder control?"     no 10. OTHER SYMPTOMS: "Do you have any other symptoms?" (e.g., fever, abdominal pain, burning with urination, blood in urine)       no 11. PREGNANCY: "Is there any chance you are pregnant?" (e.g., yes, no; LMP)       n/a  Protocols used: HIGH BLOOD PRESSURE-A-AH, BACK PAIN-A-AH

## 2019-03-03 NOTE — Telephone Encounter (Signed)
Called patient to schedule with NP for today and patient refused stating she has no one to stay with child she is keeping. Patient has been scheduled with PCP for Monday and advised patient to seek attention sooner if flank pain worsens or if BP is more than 170/80 or if she develop symptoms of dizziness, headache, blurred vision or chest pain.

## 2019-03-03 NOTE — Telephone Encounter (Signed)
Patient has enough medication and she will take until appointment.

## 2019-03-05 NOTE — Progress Notes (Deleted)
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  INTERVAL HISTORY: Patient returns to clinic today for routine six-month evaluation.  She completed 5 years of letrozole in September 2019.  She also reports a normal breast exam by her primary care physician approximately 1 month ago.  She currently feels well and is asymptomatic. She has no neurologic complaints.  She denies any fevers or recent illnesses.  She has a good appetite and denies weight loss.  She denies any chest pain or shortness of breath.  She has no nausea, vomiting, constipation, or diarrhea.  She has no urinary complaints.  Patient feels at her baseline offers no specific complaints today.  REVIEW OF SYSTEMS:   Review of Systems  Constitutional: Negative.  Negative for fever, malaise/fatigue and weight loss.  Respiratory: Negative.  Negative for cough and shortness of breath.   Cardiovascular: Negative.  Negative for chest pain and leg swelling.  Gastrointestinal: Negative.  Negative for abdominal pain.  Genitourinary: Negative.  Negative for dysuria.  Musculoskeletal: Negative.  Negative for back pain.  Skin: Negative.  Negative for rash.  Neurological: Negative.  Negative for sensory change, focal weakness and weakness.  Psychiatric/Behavioral: Negative.  The patient is not nervous/anxious and does not have insomnia.    As per HPI. Otherwise, a complete review of systems is negative.  PAST MEDICAL HISTORY: Past Medical History:  Diagnosis Date  . Arthritis   . Breast cancer ([place])   . Breast cancer, right breast ([place]) 08/24/2012   Histologic grade 1,  invasive mammary carcinoma, no specific type. 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  . Crohn's disease ([place])   . GERD (gastroesophageal reflux disease)   . History of blood transfusion 1960  . Hypertension   . Personal history of radiation therapy     PAST SURGICAL HISTORY: Past Surgical History:  Procedure Laterality Date  . BRAIN SURGERY  1980   breast biopsies, benign  . 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 BREAST BIOPSY Bilateral    - core Bx- neg  . BREAST LUMPECTOMY Right May 2014   T1c, N0; ER/PR positive, HER-2/neu not overexpressing.  Marland Kitchen BREAST SURGERY  1980's   fibro cyst both breast in [place]  . COLONOSCOPY  2017  . DILATION AND CURETTAGE OF UTERUS      FAMILY HISTORY Family History  Problem Relation Age of Onset  . Cancer Paternal Grandmother   . Heart disease Sister   . Diabetes Sister   . Breast cancer Neg Hx        ADVANCED DIRECTIVES:    HEALTH MAINTENANCE: Social History   Tobacco Use  . Smoking status: Never Smoker  . Smokeless tobacco: Never Used  Substance Use Topics  . Alcohol use: Yes    Alcohol/week: 1.0 standard drinks    Types: 1 Glasses of wine per week    Comment: OCC  . Drug use: No     Colonoscopy:  PAP:  Bone density:  Lipid panel:  Allergies  Allergen Reactions  . Letrozole Anaphylaxis and Itching  . Methocarbamol Shortness Of Breath and Swelling  . Amlodipine Itching  .  Exemestane Nausea Only  . Hydrochlorothiazide Other (See Comments)  . Lexapro [Escitalopram Oxalate] Other (See Comments)    intolerance  . Lisinopril Cough  . Sulfa Antibiotics Other (See Comments)    Current Outpatient Medications  Medication Sig Dispense Refill  . aspirin EC 81 MG tablet Take 81 mg by mouth daily.    Marland Kitchen atorvastatin (LIPITOR) 10 MG tablet Take 10 mg by mouth every other day.   11  . calcium carbonate (TUMS - DOSED IN MG ELEMENTAL CALCIUM) 500 MG chewable  tablet Chew 1 tablet by mouth daily.    . cholecalciferol (VITAMIN D) 1000 units tablet Take 1,000 Units by mouth daily.    . furosemide (LASIX) 20 MG tablet TAKE 1 TABLET BY MOUTH ONCE DAILY AS NEEDED FOR  FLUID  RETENTION 90 tablet 0  . inFLIXimab (REMICADE) 100 MG injection Inject 1 mg into the vein every 8 (eight) weeks.    Marland Kitchen loratadine (CLARITIN) 10 MG tablet TAKE 1 TABLET BY MOUTH ONCE DAILY 90 tablet 1  . LORazepam (ATIVAN) 0.5 MG tablet TAKE 1 TABLET BY MOUTH EVERY 8 HOURS AS NEEDED FOR ANXIETY -  MAX  OF  2  TABLETS  PER  DAY 60 tablet 2  . nystatin (MYCOSTATIN) 100000 UNIT/ML suspension SWISH & SWALLOW 5 ML BY MOUTH 4 TIMES DAILY  1  . Omega-3 Fatty Acids (FISH OIL PO) Take by mouth.    . polyethylene glycol powder (GLYCOLAX/MIRALAX) powder Take 17 g by mouth 2 (two) times daily as needed. 3350 g 11  . vitamin E 400 UNIT capsule Take by mouth.     No current facility-administered medications for this visit.     OBJECTIVE: There were no vitals filed for this visit.   There is no height or weight on file to calculate BMI.    ECOG FS:0 - Asymptomatic  General: Well-developed, well-nourished, no acute distress. Eyes: Pink conjunctiva, anicteric sclera. HEENT: Normocephalic, moist mucous membranes. Breast: Exam recently performed by another provider. Lungs: Clear to auscultation bilaterally. Heart: Regular rate and rhythm. No rubs, murmurs, or gallops. Abdomen: Soft, nontender, nondistended. No organomegaly noted, normoactive bowel sounds. Musculoskeletal: No edema, cyanosis, or clubbing. Neuro: Alert, answering all questions appropriately. Cranial nerves grossly intact. Skin: No rashes or petechiae noted. Psych: Normal affect.  LAB RESULTS:  Lab Results  Component Value Date   NA 137 06/30/2018   K 3.9 06/30/2018   CL 100 06/30/2018   CO2 28 06/30/2018   GLUCOSE 113 (H) 06/30/2018   BUN 14 06/30/2018   CREATININE 0.74 06/30/2018   CALCIUM 9.4 06/30/2018   PROT 7.4  06/30/2018   ALBUMIN 4.4 06/30/2018   AST 19 06/30/2018   ALT 17 06/30/2018   ALKPHOS 77 06/30/2018   BILITOT 0.9 06/30/2018   GFRNONAA >60 12/31/2016   GFRAA >60 12/31/2016    Lab Results  Component Value Date   WBC 5.8 12/31/2016   NEUTROABS 1.9 09/30/2016   HGB 13.5 12/31/2016   HCT 39.7 12/31/2016   MCV 91.1 12/31/2016   PLT 215 12/31/2016     STUDIES: No results found.  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 No Oncotype score available.  PLAN:    1.  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  Patient completed 5 years of anastrozole in September 2019.  Her most recent mammogram on October 05, 2017  was reported as BI-RADS 2, repeat in June 2020.  Return to clinic in 1 year for routine evaluation.  2. Osteopenia: Bone mineral density on March 02, 2017 with a T score of -1.7 which is slightly worse than one year prior when her T score was reported at -1.5.  Repeat bone mineral density in the next 1 to 2 weeks.  Continue calcium and vitamin D supplementation.   3. Arthritis: Chronic and unchanged.  Continue evaluation and treatment per rheumatology.    Patient expressed understanding and was in agreement with this plan. She also understands that She can call clinic at any time with any questions, concerns, or complaints.    Lloyd Huger, MD   03/05/2019 7:58 AM

## 2019-03-06 ENCOUNTER — Ambulatory Visit (INDEPENDENT_AMBULATORY_CARE_PROVIDER_SITE_OTHER): Payer: Medicare Other | Admitting: Internal Medicine

## 2019-03-06 ENCOUNTER — Other Ambulatory Visit: Payer: Self-pay

## 2019-03-06 ENCOUNTER — Encounter: Payer: Self-pay | Admitting: Internal Medicine

## 2019-03-06 DIAGNOSIS — F5105 Insomnia due to other mental disorder: Secondary | ICD-10-CM | POA: Diagnosis not present

## 2019-03-06 DIAGNOSIS — E119 Type 2 diabetes mellitus without complications: Secondary | ICD-10-CM

## 2019-03-06 DIAGNOSIS — R10A2 Flank pain, left side: Secondary | ICD-10-CM | POA: Insufficient documentation

## 2019-03-06 DIAGNOSIS — I10 Essential (primary) hypertension: Secondary | ICD-10-CM

## 2019-03-06 DIAGNOSIS — F409 Phobic anxiety disorder, unspecified: Secondary | ICD-10-CM | POA: Diagnosis not present

## 2019-03-06 DIAGNOSIS — R109 Unspecified abdominal pain: Secondary | ICD-10-CM | POA: Diagnosis not present

## 2019-03-06 MED ORDER — CHLORTHALIDONE 25 MG PO TABS: 25.0000 mg | ORAL_TABLET | Freq: Every day | ORAL | 1 refills | Status: DC

## 2019-03-06 NOTE — Assessment & Plan Note (Signed)
History and description do NOT suggest pyelonephritis or calculi.  Like muscle strain

## 2019-03-06 NOTE — Progress Notes (Signed)
Telephone Note  This visit type was conducted due to national recommendations for restrictions regarding the COVID-19 pandemic (e.g. social distancing).  This format is felt to be most appropriate for this patient at this time.  All issues noted in this document were discussed and addressed.  No physical exam was performed (except for noted visual exam findings with Video Visits).   I connected with@ on 03/06/19 at  9:00 AM EST by  telephone and verified that I am speaking with the correct person using two identifiers. Location patient: home Location provider: work or home office Persons participating in the virtual visit: patient, provider  I discussed the limitations, risks, security and privacy concerns of performing an evaluation and management service by telephone and the availability of in person appointments. I also discussed with the patient that there may be a patient responsible charge related to this service. The patient expressed understanding and agreed to proceed.  Reason for visit: follow up on multiple issues including hypertension,  Recent episode of flank/back  pain   HPI:   1) HTN:  Stopped the chlorthalidone a few months ago due to low BPs'  A a persistent cough,  But more recently readings have been elevated to 921 systolic.  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  cecking blood sugars once weekly at the most .  BS have been under 130 fasting and < 150 post prandially.  Denies any recent hypoglyemic events.  Taking her medications as directed. Following a carbohydrate modified diet 6 days per week. Denies numbness, burning and tingling of extremities.  Appetite is good.     ROS: See pertinent positives and negatives per HPI.  Past Medical History:  Diagnosis Date  . Arthritis   . Breast cancer ([place])   . 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  . Crohn's disease ([place])   . GERD (gastroesophageal reflux disease)   . History of blood transfusion 1960  . Hypertension   . Personal history of radiation therapy     Past Surgical History:  Procedure Laterality Date  . BRAIN SURGERY  1980   breast biopsies, benign  . 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 BREAST BIOPSY Bilateral    - core Bx- neg  . BREAST LUMPECTOMY Right May 2014   T1c, N0; ER/PR positive, HER-2/neu not overexpressing.  Marland Kitchen BREAST SURGERY  1980's   fibro cyst both breast in [place]  . COLONOSCOPY  2017  . DILATION AND CURETTAGE OF UTERUS      Family History  Problem Relation Age of Onset  . Cancer Paternal Grandmother   . Heart disease Sister   . Diabetes Sister   . Breast cancer Neg Hx     SOCIAL HX:  reports that she has never smoked. She has never used smokeless tobacco. She reports current alcohol use of about 1.0 standard drinks of alcohol per week. She reports that she does not use drugs.   Current Outpatient Medications:  .  aspirin EC 81 MG tablet, Take 81 mg by mouth daily., Disp: , Rfl:  .  atorvastatin (LIPITOR) 10 MG tablet, Take 10 mg by mouth every other day. , Disp: , Rfl: 11 .  calcium carbonate (TUMS - DOSED IN MG ELEMENTAL CALCIUM) 500 MG chewable tablet, Chew 1 tablet by mouth daily., Disp: , Rfl:  .  chlorthalidone (HYGROTON) 25 MG tablet, Take 25 mg by mouth daily., Disp: , Rfl:  .  cholecalciferol (VITAMIN D) 1000 units tablet, Take 1,000 Units by mouth daily., Disp: , Rfl:  .  furosemide (LASIX) 20 MG tablet, TAKE 1 TABLET BY MOUTH ONCE DAILY AS NEEDED FOR   FLUID  RETENTION, Disp: 90 tablet, Rfl: 0 .  inFLIXimab (REMICADE) 100 MG injection, Inject 1 mg into the vein every 8 (eight) weeks., Disp: , Rfl:  .  loratadine (CLARITIN) 10 MG tablet, TAKE 1 TABLET BY MOUTH ONCE DAILY, Disp: 90 tablet, Rfl: 1 .  LORazepam (ATIVAN) 0.5 MG tablet, TAKE 1 TABLET BY MOUTH EVERY 8 HOURS AS NEEDED FOR ANXIETY -  MAX  OF  2  TABLETS  PER  DAY, Disp: 60 tablet, Rfl: 2 .  nystatin (MYCOSTATIN) 100000 UNIT/ML suspension, SWISH & SWALLOW 5 ML BY MOUTH 4 TIMES DAILY, Disp: , Rfl: 1 .  Omega-3 Fatty Acids (FISH OIL PO), Take by mouth., Disp: , Rfl:  .  polyethylene glycol powder (GLYCOLAX/MIRALAX) powder, Take 17 g by mouth 2 (two) times daily as needed., Disp: 3350 g, Rfl: 11 .  vitamin E 400 UNIT capsule, Take by mouth., Disp: , Rfl:   EXAM:   General impression: alert, cooperative and articulate.  No signs of being in distress  Lungs: speech is fluent sentence length suggests that patient is not short of breath and not punctuated by cough, sneezing or sniffing. Marland Kitchen   Psych: affect normal.  speech is articulate and non pressured .  Denies suicidal thoughts   ASSESSMENT AND PLAN:  Discussed the following assessment and plan:  Diabetes mellitus with no complication ([place])  Essential hypertension  Insomnia due to anxiety and fear  Left flank pain  Diabetes mellitus with no complication ([facility]) Remains well-controlled on diet alone. Intolerant of ACE I due to cough and olmesartan due to perceived side effect of constipation ./ No proteinuria despite  Recurrent reports of "foamy urine.".  Foot exam was normal in Feb 2020. Marland Kitchen  Taking statin every other day .     Reduced  aspirin to once weekly due to nosebleeds .  Due for repeat labs   Lab Results  Component Value Date   HGBA1C 6.5 06/30/2018   Lab Results  Component Value Date   MICROALBUR <0.7 06/30/2018     Lab Results  Component Value Date   CHOL 208 (H) 06/30/2018   HDL 76.40 06/30/2018   LDLCALC 109  (H) 06/30/2018   LDLDIRECT 151.5 10/03/2013   TRIG 110.0 06/30/2018   CHOLHDL 3 06/30/2018     Essential hypertension Chlorthalidone resumed  72 hours ago by patient with good results .  advised to continue current regimen unless systolic drops < 170.  Ok to use lasix prn edema    Insomnia due to anxiety and fear Worse since stopped  prn use of lorazepam.  Did not tolerate trial of Zoloft.   Melatonin not helping.  Discussed herbal teas as alternative   Left flank pain History and description do NOT suggest pyelonephritis or calculi.  Like muscle strain     I discussed  the assessment and treatment plan with the patient. The patient was provided an opportunity to ask questions and all were answered. The patient agreed with the plan and demonstrated an understanding of the instructions.   The patient was advised to call back or seek an in-person evaluation if the symptoms worsen or if the condition fails to improve as anticipated.   I provided  25 minutes of non-face-to-face time during this encounter reviewing patient's current problems and post surgeries.  Providing counseling on the above mentioned problems , and coordination  of care .  [name], MD

## 2019-03-06 NOTE — Assessment & Plan Note (Signed)
Remains well-controlled on diet alone. Intolerant of ACE I due to cough and olmesartan due to perceived side effect of constipation ./ No proteinuria despite  Recurrent reports of "foamy urine.".  Foot exam was normal in Feb 2020. Marland Kitchen  Taking statin every other day .     Reduced  aspirin to once weekly due to nosebleeds .  Due for repeat labs   Lab Results  Component Value Date   HGBA1C 6.5 06/30/2018   Lab Results  Component Value Date   MICROALBUR <0.7 06/30/2018     Lab Results  Component Value Date   CHOL 208 (H) 06/30/2018   HDL 76.40 06/30/2018   LDLCALC 109 (H) 06/30/2018   LDLDIRECT 151.5 10/03/2013   TRIG 110.0 06/30/2018   CHOLHDL 3 06/30/2018

## 2019-03-06 NOTE — Assessment & Plan Note (Signed)
Worse since stopped  prn use of lorazepam.  Did not tolerate trial of Zoloft.   Melatonin not helping.  Discussed herbal teas as alternative

## 2019-03-06 NOTE — Assessment & Plan Note (Signed)
Chlorthalidone resumed  72 hours ago by patient with good results .  advised to continue current regimen unless systolic drops < 604.  Ok to use lasix prn edema

## 2019-03-06 NOTE — Patient Instructions (Signed)
Resume chlorthalidone daily unless your bp drops to < 110  Return for fasting labs this month

## 2019-03-08 ENCOUNTER — Other Ambulatory Visit: Payer: Self-pay | Admitting: Internal Medicine

## 2019-03-08 ENCOUNTER — Telehealth: Payer: Self-pay | Admitting: Emergency Medicine

## 2019-03-08 NOTE — Telephone Encounter (Signed)
Called pt to prescreen her for her appointment tomorrow, pt asked to be rescheduled due to having to take her husband to the doctor in the morning. Message sent to scheduling team.

## 2019-03-08 NOTE — Telephone Encounter (Signed)
Refilled: 02/09/2019 Last OV: 03/06/2019 Next OV: 04/20/2019

## 2019-03-09 ENCOUNTER — Inpatient Hospital Stay: Payer: Medicare Other | Admitting: Oncology

## 2019-03-09 DIAGNOSIS — C50911 Malignant neoplasm of unspecified site of right female breast: Secondary | ICD-10-CM

## 2019-03-10 DIAGNOSIS — K5080 Crohn's disease of both small and large intestine without complications: Secondary | ICD-10-CM | POA: Diagnosis not present

## 2019-03-16 ENCOUNTER — Inpatient Hospital Stay: Payer: Medicare Other | Attending: Oncology | Admitting: Oncology

## 2019-03-16 ENCOUNTER — Other Ambulatory Visit: Payer: Self-pay

## 2019-03-16 VITALS — BP 138/77 | HR 82 | Resp 16 | Wt 145.7 lb

## 2019-03-16 DIAGNOSIS — Z79899 Other long term (current) drug therapy: Secondary | ICD-10-CM | POA: Insufficient documentation

## 2019-03-16 DIAGNOSIS — Z853 Personal history of malignant neoplasm of breast: Secondary | ICD-10-CM | POA: Diagnosis not present

## 2019-03-16 DIAGNOSIS — M8580 Other specified disorders of bone density and structure, unspecified site: Secondary | ICD-10-CM | POA: Insufficient documentation

## 2019-03-16 DIAGNOSIS — C50911 Malignant neoplasm of unspecified site of right female breast: Secondary | ICD-10-CM

## 2019-03-16 DIAGNOSIS — Z809 Family history of malignant neoplasm, unspecified: Secondary | ICD-10-CM | POA: Insufficient documentation

## 2019-03-16 DIAGNOSIS — K5090 Crohn's disease, unspecified, without complications: Secondary | ICD-10-CM | POA: Diagnosis not present

## 2019-03-16 DIAGNOSIS — Z7982 Long term (current) use of aspirin: Secondary | ICD-10-CM | POA: Insufficient documentation

## 2019-03-16 DIAGNOSIS — Z8249 Family history of ischemic heart disease and other diseases of the circulatory system: Secondary | ICD-10-CM | POA: Insufficient documentation

## 2019-03-16 DIAGNOSIS — I10 Essential (primary) hypertension: Secondary | ICD-10-CM | POA: Diagnosis not present

## 2019-03-16 DIAGNOSIS — Z170 Estrogen receptor positive status [ER+]: Secondary | ICD-10-CM | POA: Diagnosis not present

## 2019-03-16 NOTE — Progress Notes (Signed)
Patient is here for follow up, she would like breast exam to make sure things are ok.

## 2019-03-17 ENCOUNTER — Other Ambulatory Visit: Payer: Self-pay | Admitting: Oncology

## 2019-03-17 DIAGNOSIS — C50911 Malignant neoplasm of unspecified site of right female breast: Secondary | ICD-10-CM

## 2019-03-17 DIAGNOSIS — Z1231 Encounter for screening mammogram for malignant neoplasm of breast: Secondary | ICD-10-CM

## 2019-03-17 NOTE — Progress Notes (Signed)
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  INTERVAL HISTORY: Patient returns to clinic today for routine yearly evaluation.  She currently feels well and is asymptomatic. She has no neurologic complaints.  She denies any fevers or recent illnesses.  She has a good appetite and denies weight loss.  She denies any chest pain, shortness of breath, cough, or hemoptysis.  She has no nausea, vomiting, constipation, or diarrhea.  She has no urinary complaints.  Patient feels at her baseline offers no specific complaints today.  REVIEW OF SYSTEMS:   Review of Systems  Constitutional: Negative.  Negative for fever, malaise/fatigue and weight loss.  Respiratory: Negative.  Negative for cough and shortness of breath.   Cardiovascular: Negative.  Negative for chest pain and leg swelling.  Gastrointestinal: Negative.  Negative for abdominal pain.  Genitourinary: Negative.  Negative for dysuria.  Musculoskeletal: Negative.  Negative for back pain.  Skin: Negative.  Negative for rash.  Neurological: Negative.  Negative for sensory change, focal weakness and weakness.  Psychiatric/Behavioral: Negative.  The patient is not nervous/anxious and does not have insomnia.    As per HPI. Otherwise, a complete review of systems is negative.  PAST MEDICAL HISTORY: Past Medical History:  Diagnosis Date  . Arthritis   . Breast cancer ([place])   . 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 1.6 cm node negative, ER/PR positive, HER-2/neu not overexpressing tumor resected May  2014.  . Crohn's disease ([place])   . GERD (gastroesophageal reflux disease)   . History of blood transfusion 1960  . Hypertension   . Personal history of radiation therapy     PAST SURGICAL HISTORY: Past Surgical History:  Procedure Laterality Date  . BRAIN SURGERY  1980   breast biopsies, benign  . 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 BREAST BIOPSY Bilateral    - core Bx- neg  . BREAST LUMPECTOMY Right May 2014   T1c, N0; ER/PR positive, HER-2/neu not overexpressing.  Marland Kitchen BREAST SURGERY  1980's   fibro cyst both breast in [place]  . COLONOSCOPY  2017  . DILATION AND CURETTAGE OF UTERUS      FAMILY HISTORY Family History  Problem Relation Age of Onset  . Cancer Paternal Grandmother   . Heart disease Sister   . Diabetes Sister   . Breast cancer Neg Hx        ADVANCED DIRECTIVES:    HEALTH MAINTENANCE: Social History   Tobacco Use  . Smoking status: Never Smoker  . Smokeless tobacco: Never Used  Substance Use Topics  . Alcohol use: Yes    Alcohol/week: 1.0 standard drinks    Types: 1 Glasses of wine per week    Comment: OCC  . Drug use: No     Colonoscopy:  PAP:  Bone density:  Lipid panel:  Allergies  Allergen Reactions  . Letrozole Anaphylaxis and Itching  . Methocarbamol Shortness Of Breath and Swelling  . Amlodipine Itching  . Exemestane Nausea Only  . Hydrochlorothiazide Other (See Comments)  . Lexapro [Escitalopram Oxalate] Other (See Comments)    intolerance  . Lisinopril Cough  .  Sulfa Antibiotics Other (See Comments)    Current Outpatient Medications  Medication Sig Dispense Refill  . aspirin EC 81 MG tablet Take 81 mg by mouth daily.    Marland Kitchen atorvastatin (LIPITOR) 10 MG tablet Take 10 mg by mouth every other day.   11  . chlorthalidone (HYGROTON) 25 MG tablet Take 1 tablet (25 mg total) by mouth daily. 90 tablet 1  . cholecalciferol (VITAMIN D) 1000 units tablet Take 1,000 Units by mouth daily.    . furosemide (LASIX) 20  MG tablet TAKE 1 TABLET BY MOUTH ONCE DAILY AS NEEDED FOR  FLUID  RETENTION 90 tablet 0  . inFLIXimab (REMICADE) 100 MG injection Inject 1 mg into the vein every 8 (eight) weeks.    Marland Kitchen loratadine (CLARITIN) 10 MG tablet TAKE 1 TABLET BY MOUTH ONCE DAILY 90 tablet 1  . LORazepam (ATIVAN) 0.5 MG tablet TAKE 1 TABLET BY MOUTH EVERY 8 HOURS AS NEEDED FOR ANXIETY -  MAX  OF  2  TABLETS  PER  DAY 60 tablet 0  . Multiple Vitamins-Minerals (MULTIVITAMIN ADULT PO) Take by mouth.    . nystatin (MYCOSTATIN) 100000 UNIT/ML suspension SWISH & SWALLOW 5 ML BY MOUTH 4 TIMES DAILY  1  . Omega-3 Fatty Acids (FISH OIL PO) Take by mouth.    . polyethylene glycol powder (GLYCOLAX/MIRALAX) powder Take 17 g by mouth 2 (two) times daily as needed. 3350 g 11  . vitamin E 400 UNIT capsule Take by mouth.    . calcium carbonate (TUMS - DOSED IN MG ELEMENTAL CALCIUM) 500 MG chewable tablet Chew 1 tablet by mouth daily.     No current facility-administered medications for this visit.     OBJECTIVE: Vitals:   03/16/19 1326  BP: 138/77  Pulse: 82  Resp: 16  SpO2: 97%     Body mass index is 27.08 kg/m.    ECOG FS:0 - Asymptomatic  General: Well-developed, well-nourished, no acute distress. Eyes: Pink conjunctiva, anicteric sclera. HEENT: Normocephalic, moist mucous membranes. Breast: Bilateral breast and axilla without lumps or masses. Lungs: Clear to auscultation bilaterally. Heart: Regular rate and rhythm. No rubs, murmurs, or gallops. Abdomen: Soft, nontender, nondistended. No organomegaly noted, normoactive bowel sounds. Musculoskeletal: No edema, cyanosis, or clubbing. Neuro: Alert, answering all questions appropriately. Cranial nerves grossly intact. Skin: No rashes or petechiae noted. Psych: Normal affect.  LAB RESULTS:  Lab Results  Component Value Date   NA 137 06/30/2018   K 3.9 06/30/2018   CL 100 06/30/2018   CO2 28 06/30/2018   GLUCOSE 113 (H) 06/30/2018   BUN 14 06/30/2018   CREATININE  0.74 06/30/2018   CALCIUM 9.4 06/30/2018   PROT 7.4 06/30/2018   ALBUMIN 4.4 06/30/2018   AST 19 06/30/2018   ALT 17 06/30/2018   ALKPHOS 77 06/30/2018   BILITOT 0.9 06/30/2018   GFRNONAA >60 12/31/2016   GFRAA >60 12/31/2016    Lab Results  Component Value Date   WBC 5.8 12/31/2016   NEUTROABS 1.9 09/30/2016   HGB 13.5 12/31/2016   HCT 39.7 12/31/2016   MCV 91.1 12/31/2016   PLT 215 12/31/2016     STUDIES: No results found.  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 No Oncotype score available.  PLAN:    1.  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  Patient completed 5 years of anastrozole  in September 2019.  Patient's most recent mammogram on November 29, 2018 was reported as BI-RADS 2.  Repeat in July 2021.  Return to clinic in 1 year for routine evaluation.    2. Osteopenia: Patient's most recent bone mineral density on March 15, 2018 reported T score of -1.6.  This is essentially unchanged from 1 and 2 years prior.  No intervention is needed at this time.  Continue calcium and vitamin D supplementation.  Continue monitoring per primary care.     3. Arthritis: Chronic and unchanged.  Continue evaluation and treatment per rheumatology.    Patient expressed understanding and was in agreement with this plan. She also understands that She can call clinic at any time with any questions, concerns, or complaints.    Lloyd Huger, MD   03/17/2019 6:39 AM

## 2019-04-07 ENCOUNTER — Other Ambulatory Visit: Payer: Self-pay

## 2019-04-11 ENCOUNTER — Other Ambulatory Visit (INDEPENDENT_AMBULATORY_CARE_PROVIDER_SITE_OTHER): Payer: Medicare Other

## 2019-04-11 ENCOUNTER — Other Ambulatory Visit: Payer: Self-pay

## 2019-04-11 DIAGNOSIS — E119 Type 2 diabetes mellitus without complications: Secondary | ICD-10-CM

## 2019-04-11 DIAGNOSIS — I10 Essential (primary) hypertension: Secondary | ICD-10-CM

## 2019-04-11 LAB — LIPID PANEL
Cholesterol: 227 mg/dL — ABNORMAL HIGH (ref 0–200)
HDL: 76.1 mg/dL (ref >39.00)
LDL Cholesterol: 138 mg/dL — ABNORMAL HIGH (ref 0–99)
NonHDL: 151.36
Total CHOL/HDL Ratio: 3
Triglycerides: 69 mg/dL (ref 0.0–149.0)
VLDL: 13.8 mg/dL (ref 0.0–40.0)

## 2019-04-11 LAB — HEMOGLOBIN A1C: Hgb A1c MFr Bld: 6.6 % — ABNORMAL HIGH (ref 4.6–6.5)

## 2019-04-11 LAB — COMPREHENSIVE METABOLIC PANEL
ALT: 14 U/L (ref 0–35)
AST: 18 U/L (ref 0–37)
Albumin: 4.1 g/dL (ref 3.5–5.2)
Alkaline Phosphatase: 70 U/L (ref 39–117)
BUN: 16 mg/dL (ref 6–23)
CO2: 30 mEq/L (ref 19–32)
Calcium: 9.1 mg/dL (ref 8.4–10.5)
Chloride: 102 mEq/L (ref 96–112)
Creatinine, Ser: 0.7 mg/dL (ref 0.40–1.20)
GFR: 80.93 mL/min (ref >60.00)
Glucose, Bld: 122 mg/dL — ABNORMAL HIGH (ref 70–99)
Potassium: 3.6 mEq/L (ref 3.5–5.1)
Sodium: 140 mEq/L (ref 135–145)
Total Bilirubin: 0.8 mg/dL (ref 0.2–1.2)
Total Protein: 6.9 g/dL (ref 6.0–8.3)

## 2019-04-11 LAB — MICROALBUMIN / CREATININE URINE RATIO
Creatinine,U: 145.9 mg/dL
Microalb Creat Ratio: 0.9 mg/g (ref 0.0–30.0)
Microalb, Ur: 1.4 mg/dL (ref 0.0–1.9)

## 2019-04-20 ENCOUNTER — Other Ambulatory Visit: Payer: Self-pay

## 2019-04-20 ENCOUNTER — Ambulatory Visit (INDEPENDENT_AMBULATORY_CARE_PROVIDER_SITE_OTHER): Payer: Medicare Other

## 2019-04-20 ENCOUNTER — Encounter: Payer: Self-pay | Admitting: Internal Medicine

## 2019-04-20 ENCOUNTER — Ambulatory Visit (INDEPENDENT_AMBULATORY_CARE_PROVIDER_SITE_OTHER): Payer: Medicare Other | Admitting: Internal Medicine

## 2019-04-20 VITALS — Ht 61.5 in | Wt 145.0 lb

## 2019-04-20 DIAGNOSIS — Z0000 Encounter for general adult medical examination without abnormal findings: Secondary | ICD-10-CM

## 2019-04-20 DIAGNOSIS — F409 Phobic anxiety disorder, unspecified: Secondary | ICD-10-CM

## 2019-04-20 DIAGNOSIS — F5105 Insomnia due to other mental disorder: Secondary | ICD-10-CM | POA: Diagnosis not present

## 2019-04-20 DIAGNOSIS — E782 Mixed hyperlipidemia: Secondary | ICD-10-CM

## 2019-04-20 DIAGNOSIS — I272 Pulmonary hypertension, unspecified: Secondary | ICD-10-CM | POA: Diagnosis not present

## 2019-04-20 DIAGNOSIS — K5010 Crohn's disease of large intestine without complications: Secondary | ICD-10-CM

## 2019-04-20 DIAGNOSIS — E1169 Type 2 diabetes mellitus with other specified complication: Secondary | ICD-10-CM

## 2019-04-20 DIAGNOSIS — I10 Essential (primary) hypertension: Secondary | ICD-10-CM

## 2019-04-20 MED ORDER — CHLORTHALIDONE 25 MG PO TABS: 25.0000 mg | ORAL_TABLET | Freq: Every day | ORAL | 1 refills | Status: DC

## 2019-04-20 NOTE — Progress Notes (Addendum)
Subjective:   Kim Sanchez is a 78 y.o. female who presents for Medicare Annual (Subsequent) preventive examination.  Review of Systems:  No ROS.  Medicare Wellness Virtual Visit.  Visual/audio telehealth visit, UTA vital signs.   Ht/wt transferred from previous visit. See social history for additional risk factors.   Cardiac Risk Factors include: advanced age (>47mn, >>31women);hypertension;diabetes mellitus     Objective:     Vitals: Ht 5' 1.5" (1.562 m)   Wt 145 lb (65.8 kg)   BMI 26.95 kg/m   Body mass index is 26.95 kg/m.  Advanced Directives 04/20/2019 03/16/2019 04/15/2018 02/24/2018 08/23/2017 04/14/2017 03/04/2017  Does Patient Have a Medical Advance Directive? _0  Yes Yes  Type of AParamedicof AO[place]askaLiving will Living will;Healthcare Power of [place] ViewLiving will [place] will [place]anchfieldLiving will [place]Living will -  Does patient want to make changes to medical advance directive? No - Patient declined - No - Patient declined No - Patient declined No - Patient declined No - Patient declined -  Copy of HMakemie Parkin Chart? Yes - validated most recent copy scanned in chart (See row information) - Yes - validated most recent copy scanned in chart (See row information) No - copy requested No - copy requested No - copy requested -  Would patient like information on creating a medical advance directive? - - - - - - -    Tobacco Social History   Tobacco Use  Smoking Status Never Smoker  Smokeless Tobacco Never Used     Counseling given: Not Answered   Clinical Intake:  Pre-visit preparation completed: Yes        Diabetes: Yes(Followed by pcp) CBG done?: No Did pt. bring in CBG monitor from home?: No  How often do you need to have someone help you when you read instructions, pamphlets, or other written  materials from your doctor or pharmacy?: 1 - Never  Interpreter Needed?: No     Past Medical History:  Diagnosis Date  . Arthritis   . Breast cancer ([place]   . 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  . Crohn's disease ([place]   . GERD (gastroesophageal reflux disease)   . History of blood transfusion 1960  . Hypertension   . Personal history of radiation therapy    Past Surgical History:  Procedure Laterality Date  . BRAIN SURGERY  1980   breast biopsies, benign  . 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 BREAST BIOPSY Bilateral    - core Bx- neg  . BREAST LUMPECTOMY Right May 2014   T1c, N0; ER/PR positive, HER-2/neu not overexpressing.  .Marland KitchenBREAST SURGERY  1980's   [place] . COL[place]OSCOPY  2017  . DILATION AND CURETTAGE OF UTERUS     Family History  Problem Relation Age of Onset  . Cancer Paternal Grandmother   . Heart disease Sister   . Diabetes Sister   . Breast cancer Neg Hx    Social History   Socioeconomic History  . Marital status: Married    Spouse name: Not on file  . Number of children: Not on file  . Years of education: Not on file  . Highest education level: Not  on file  Occupational History  . Not on file  Tobacco Use  . Smoking status: Never Smoker  . Smokeless tobacco: Never Used  Substance and Sexual Activity  . Alcohol use: Yes    Alcohol/week: 1.0 standard drinks    Types: 1 Glasses of wine per week    Comment: OCC  . Drug use: No  . Sexual activity: Not Currently  Other Topics Concern  . Not on file  Social History Narrative  . Not on file   Social Determinants of Health   Financial Resource Strain:   . Difficulty of Paying Living Expenses: Not on file  Food Insecurity:   . Worried About Charity fundraiser in the Last Year: Not  on file  . Ran Out of Food in the Last Year: Not on file  Transportation Needs:   . Lack of Transportation (Medical): Not on file  . Lack of Transportation (Non-Medical): Not on file  Physical Activity:   . Days of Exercise per Week: Not on file  . Minutes of Exercise per Session: Not on file  Stress:   . Feeling of Stress : Not on file  Social Connections:   . Frequency of Communication with Friends and Family: Not on file  . Frequency of Social Gatherings with Friends and Family: Not on file  . Attends Religious Services: Not on file  . Active Member of Clubs or Organizations: Not on file  . Attends Archivist Meetings: Not on file  . Marital Status: Not on file    Outpatient Encounter Medications as of 04/20/2019  Medication Sig  . aspirin EC 81 MG tablet Take 81 mg by mouth daily.  Marland Kitchen atorvastatin (LIPITOR) 10 MG tablet Take 10 mg by mouth every other day.   . calcium carbonate (TUMS - DOSED IN MG ELEMENTAL CALCIUM) 500 MG chewable tablet Chew 1 tablet by mouth daily.  . chlorthalidone (HYGROTON) 25 MG tablet Take 1 tablet (25 mg total) by mouth daily.  . cholecalciferol (VITAMIN D) 1000 units tablet Take 1,000 Units by mouth daily.  . furosemide (LASIX) 20 MG tablet TAKE 1 TABLET BY MOUTH ONCE DAILY AS NEEDED FOR  FLUID  RETENTION  . inFLIXimab (REMICADE) 100 MG injection Inject 1 mg into the vein every 8 (eight) weeks.  Marland Kitchen loratadine (CLARITIN) 10 MG tablet TAKE 1 TABLET BY MOUTH ONCE DAILY  . LORazepam (ATIVAN) 0.5 MG tablet TAKE 1 TABLET BY MOUTH EVERY 8 HOURS AS NEEDED FOR ANXIETY -  MAX  OF  2  TABLETS  PER  DAY  . Multiple Vitamins-Minerals (MULTIVITAMIN ADULT PO) Take by mouth.  . nystatin (MYCOSTATIN) 100000 UNIT/ML suspension SWISH & SWALLOW 5 ML BY MOUTH 4 TIMES DAILY  . Omega-3 Fatty Acids (FISH OIL PO) Take by mouth.  . polyethylene glycol powder (GLYCOLAX/MIRALAX) powder Take 17 g by mouth 2 (two) times daily as needed.  . vitamin E 400 UNIT capsule Take by  mouth.  . [DISCONTINUED] losartan-hydrochlorothiazide (HYZAAR) 50-12.5 MG tablet Take 1 tablet by mouth daily.   No facility-administered encounter medications on file as of 04/20/2019.    Activities of Daily Living In your present state of health, do you have any difficulty performing the following activities: 04/20/2019  Hearing? N  Vision? N  Difficulty concentrating or making decisions? N  Walking or climbing stairs? N  Dressing or bathing? N  Doing errands, shopping? N  Preparing Food and eating ? N  Using the Toilet? N  In the past  six months, have you accidently leaked urine? N  Do you have problems with loss of bowel control? N  Managing your Medications? N  Managing your Finances? N  Housekeeping or managing your Housekeeping? N  Some recent data might be hidden    Patient Care Team: [name], MD as PCP - General (Internal Medicine) Bary Castilla, Forest Gleason, MD (General Surgery) [name], MD (Internal Medicine)    Assessment:   This is a routine wellness examination for [place].  Nurse connected with patient 04/20/19 at 10:00 AM EST by a telephone enabled telemedicine application and verified that I am speaking with the correct person using two identifiers. Patient stated full name and DOB. Patient gave permission to continue with virtual visit. Patient's location was at home and Nurse's location was at [place] office.   Patient is alert and oriented x3. Patient denies difficulty focusing or concentrating. Patient likes to read daily devotions and has social interaction with her sisters for brain stimulation.   Health Maintenance Due: -Foot Exam- followed by pcp -Hgb A1c- 04/11/19 (6.6) See completed HM at the end of note.   Eye: Visual acuity not assessed. Virtual visit. Followed by their ophthalmologist. Retinopathy- none reported.  Dental: UTD  Hearing: Demonstrates normal hearing during visit.  Safety:  Patient feels safe at home- yes Patient  does have smoke detectors at home- yes Patient does wear sunscreen or protective clothing when in direct sunlight - yes Patient does wear seat belt when in a moving vehicle - yes Patient drives- yes Adequate lighting in walkways free from debris- yes Grab bars and handrails used as appropriate- yes Ambulates with an assistive device- no Cell phone on person when ambulating outside of the home- yes  Social: Alcohol intake - yes      Smoking history- never   Illicit drug use? none  Medication: Taking as directed and without issues.  Pill box in use -yes  Self managed - yes   Covid-19: Precautions and sickness symptoms discussed. Wears mask, social distancing, hand hygiene as appropriate.   Activities of Daily Living Patient denies needing assistance with: household chores, feeding themselves, getting from bed to chair, getting to the toilet, bathing/showering, dressing, managing money, or preparing meals.   Discussed the importance of a healthy diet, water intake and the benefits of aerobic exercise.   Physical activity- active around the home. 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  Diet:  Regular Water: good intake  Other Providers Patient Care Team: [name], MD as PCP - General (Internal Medicine) Bary Castilla, Forest Gleason, MD (General Surgery) [name], MD (Internal Medicine)   Exercise Activities and Dietary recommendations Current Exercise Habits: Home exercise routine, Type of exercise: walking, Frequency (Times/Week): 3, Intensity: Mild  Goals      Patient Stated   . Increase physical activity (pt-stated)     Walk more for exercise  Lose about 20lb       Fall Risk Fall Risk  04/20/2019 03/06/2019 12/14/2018 04/15/2018 03/24/2018  Falls in the past year? 0 0 0 0 0  Comment - - - - Emmi Telephone Survey: data to providers prior to load  Number falls in past yr: - - - - -  Injury with Fall? - - - - -  Follow up Falls prevention discussed Falls evaluation completed -  - -   Timed Get Up and Go performed: no, virtual visit  Depression Screen PHQ 2/9 Scores 04/20/2019 04/15/2018 04/14/2017 01/06/2017  PHQ - 2 Score 0 0 0  0     Cognitive Function MMSE - Mini Mental State Exam 04/14/2017 04/14/2016  Orientation to time 5 5  Orientation to Place 5 5  Registration 3 3  Attention/ Calculation 5 5  Recall 3 3  Language- name 2 objects 2 2  Language- repeat 1 1  Language- follow 3 step command 3 3  Language- read & follow direction 1 1  Write a sentence 1 1  Copy design 1 1  Total score 30 30     6CIT Screen 04/20/2019 04/15/2018  What Year? 0 points 0 points  What month? 0 points 0 points  What time? 0 points 0 points  Count back from 20 0 points 0 points  Months in reverse 0 points 0 points  Repeat phrase - 0 points  Total Score - 0    Immunization History  Administered Date(s) Administered  . Influenza Split 02/01/2013  . Influenza Whole 01/17/2012  . Influenza, High Dose Seasonal PF 02/02/2016, 01/13/2017, 02/04/2018, 02/06/2019  . Influenza,inj,Quad PF,6+ Mos 03/20/2015  . Influenza-Unspecified 03/05/2014  . Pneumococcal Conjugate-13 07/24/2014, 10/06/2017  . Pneumococcal Polysaccharide-23 12/16/2012  . Td 03/19/2006  . Tdap 07/25/2015  . Zoster Recombinat (Shingrix) 07/04/2018, 11/01/2018   Screening Tests Health Maintenance  Topic Date Due  . FOOT EXAM  02/22/2019  . HEMOGLOBIN A1C  10/10/2019  . OPHTHALMOLOGY EXAM  01/17/2020  . URINE MICROALBUMIN  04/10/2020  . TETANUS/TDAP  07/24/2025  . INFLUENZA VACCINE  Completed  . DEXA SCAN  Completed  . PNA vac Low Risk Adult  Completed      Plan:   Keep all routine maintenance appointments.   Follow up with your doctor today @ 10:30  Medicare Attestation I have personally reviewed: The patient's medical and social history Their use of alcohol, tobacco or illicit drugs Their current medications and supplements The patient's functional ability including ADLs,fall risks,  home safety risks, cognitive, and hearing and visual impairment Diet and physical activities Evidence for depression   I have reviewed and discussed with patient certain preventive protocols, quality metrics, and best practice recommendations.   OBrien-Blaney,  L, LPN  [id number]    I have reviewed the above information and agree with above.   Deborra Medina, MD

## 2019-04-20 NOTE — Patient Instructions (Signed)
We can switch your atorvastatin to pravastatin  or rosuvastatin if better tolerated prior to next refill   Your blood pressure is fine without the daily use of chlorthalidone.  Only use if BP is 140/80 or higher for two consecutive days

## 2019-04-20 NOTE — Progress Notes (Signed)
Virtual Visit via [email or web address]  This visit type was conducted due to national recommendations for restrictions regarding the COVID-19 pandemic (e.g. social distancing).  This format is felt to be most appropriate for this patient at this time.  All issues noted in this document were discussed and addressed.  No physical exam was performed (except for noted visual exam findings with Video Visits).   I connected with@ on  at 10:30 AM EST by a video enabled telemedicine application  and verified that I am speaking with the correct person using two identifiers. Location patient: home Location provider: work or home office Persons participating in the virtual visit: patient, provider  I discussed the limitations, risks, security and privacy concerns of performing an evaluation and management service by telephone and the availability of in person appointments. I also discussed with the patient that there may be a patient responsible charge related to this service. The patient expressed understanding and agreed to proceed.   Reason for visit: follow up on type 2 diabetes and hypertension  HPI:  78 yr old female with Crohn's disease (in remission),  Hypertension and GAD  , recently diagnosed T2DM presents for  follow up    She feels generally well, is not exercising  Or  checking blood sugars   Denies any recent hypoglyemic symptoms.  Taking her  medications as directed. Following a carbohydrate modified diet 6 days per week. Denies numbness, burning and tingling of extremities. Appetite is good.   Patient is taking her medications as prescribed but has stopped chlorthalidine due to intolerance  And excessive mucous membrane dryness.   Home BP readings have been done about once per week and are  generally < 130/80 .  She is avoiding added salt in her diet/     ROS: See pertinent positives and negatives per HPI.  Past Medical History:  Diagnosis Date  . Arthritis   . Breast cancer ([place])   . 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  . Crohn's disease ([place])   . GERD (gastroesophageal reflux disease)   . History of blood transfusion 1960  . Hypertension   . Personal history of radiation therapy     Past Surgical History:  Procedure Laterality Date  . BRAIN SURGERY  1980   breast biopsies, benign  . 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 BREAST BIOPSY Bilateral    - core Bx- neg  . BREAST LUMPECTOMY Right May 2014   T1c, N0; ER/PR positive, HER-2/neu not overexpressing.  Marland Kitchen BREAST SURGERY  1980's   fibro cyst both breast in [place]  . COLONOSCOPY  2017  . DILATION AND CURETTAGE OF UTERUS      Family History  Problem Relation Age of Onset  . Cancer Paternal Grandmother   . Heart disease Sister   . Diabetes Sister   . Breast cancer Neg Hx     SOCIAL HX:  reports that she has never smoked. She has never used smokeless tobacco. She reports current alcohol use of about 1.0 standard drinks of alcohol per week. She reports that she does not use drugs.   Current Outpatient Medications:  .  aspirin EC 81 MG tablet, Take 81 mg by mouth daily., Disp: , Rfl:  .  atorvastatin (LIPITOR) 10 MG tablet, Take 10 mg by mouth every other day. , Disp: ,  Rfl: 11 .  calcium carbonate (TUMS - DOSED IN MG ELEMENTAL CALCIUM) 500 MG chewable tablet, Chew 1 tablet by mouth daily., Disp: , Rfl:  .  chlorthalidone (HYGROTON) 25 MG tablet, Take 1 tablet (25 mg total) by mouth daily., Disp: 90 tablet, Rfl: 1 .  cholecalciferol (VITAMIN D) 1000 units tablet, Take 1,000 Units by mouth daily., Disp: , Rfl:  .  furosemide (LASIX) 20 MG tablet, TAKE 1 TABLET BY MOUTH ONCE DAILY AS NEEDED FOR  FLUID  RETENTION, Disp: 90 tablet, Rfl: 0 .  inFLIXimab (REMICADE) 100 MG injection, Inject 1 mg into the vein every 8 (eight) weeks., Disp:  , Rfl:  .  loratadine (CLARITIN) 10 MG tablet, TAKE 1 TABLET BY MOUTH ONCE DAILY, Disp: 90 tablet, Rfl: 1 .  LORazepam (ATIVAN) 0.5 MG tablet, TAKE 1 TABLET BY MOUTH EVERY 8 HOURS AS NEEDED FOR ANXIETY -  MAX  OF  2  TABLETS  PER  DAY, Disp: 60 tablet, Rfl: 0 .  Multiple Vitamins-Minerals (MULTIVITAMIN ADULT PO), Take by mouth., Disp: , Rfl:  .  nystatin (MYCOSTATIN) 100000 UNIT/ML suspension, SWISH & SWALLOW 5 ML BY MOUTH 4 TIMES DAILY, Disp: , Rfl: 1 .  Omega-3 Fatty Acids (FISH OIL PO), Take by mouth., Disp: , Rfl:  .  polyethylene glycol powder (GLYCOLAX/MIRALAX) powder, Take 17 g by mouth 2 (two) times daily as needed., Disp: 3350 g, Rfl: 11 .  vitamin E 400 UNIT capsule, Take by mouth., Disp: , Rfl:   EXAM:  VITALS per patient if applicable:  GENERAL: alert, oriented, appears well and in no acute distress  HEENT: atraumatic, conjunttiva clear, no obvious abnormalities on inspection of external nose and ears  NECK: normal movements of the head and neck  LUNGS: on inspection no signs of respiratory distress, breathing rate appears normal, no obvious gross SOB, gasping or wheezing  CV: no obvious cyanosis  MS: moves all visible extremities without noticeable abnormality  PSYCH/NEURO: pleasant and cooperative, no obvious depression or anxiety, speech and thought processing grossly intact  ASSESSMENT AND PLAN:  Discussed the following assessment and plan:  Pulmonary hypertension, mild (HCC), Chronic  Crohn's disease of large intestine without complication ([place]), Chronic  Insomnia due to anxiety and fear  DM type 2 with diabetic mixed hyperlipidemia (HCC)  Essential hypertension  Insomnia due to anxiety and fear Worse since stopped  prn use of lorazepam.  Did not tolerate trial of Zoloft.   Melatonin not helping.  Discussed herbal teas as alternative   DM type 2 with diabetic mixed hyperlipidemia ([place]) Remains well-controlled on diet alone. Intolerant of ACE I due to  cough and olmesartan due to perceived side effect of constipation ./ No proteinuria despite  Recurrent reports of "foamy urine.".  Foot exam was normal in Feb 2020. Marland Kitchen  Taking sta daily tin every other day  Due to muscle pain with daily use,  Risk of CAD high at 25%  Discussed change to pravastatin or rosuvastatin   Every other day. At last visit we reduced  aspirin to once weekly due to nosebleeds .   Lab Results  Component Value Date   HGBA1C 6.6 (H) 04/11/2019   Lab Results  Component Value Date   MICROALBUR 1.4 04/11/2019     Lab Results  Component Value Date   CHOL 227 (H) 04/11/2019   HDL 76.10 04/11/2019   LDLCALC 138 (H) 04/11/2019   LDLDIRECT 151.5 10/03/2013   TRIG 69.0 04/11/2019   CHOLHDL 3 04/11/2019  Crohn's disease Managed with Remicaide infusions,  No recent flares.  Last colonoscopy 2017 at [facility]   Pulmonary hypertension, mild (HCC) Mild , suggested by [facility] showing TR and PR.  Last one Jan 2020   Essential hypertension Well controlled on current regimen without chlorthalidone which was prescribed by cardiology but stopped  Due to intolerance. . Renal function stable, no changes today.    I discussed the assessment and treatment plan with the patient. The patient was provided an opportunity to ask questions and all were answered. The patient agreed with the plan and demonstrated an understanding of the instructions.   The patient was advised to call back or seek an in-person evaluation if the symptoms worsen or if the condition fails to improve as anticipated.  I provided  25 minutes of non-face-to-face time during this encounter reviewing patient's current problems and past procedures/imaging studies, providing counseling on the above mentioned problems , and coordination  of care .

## 2019-04-20 NOTE — Patient Instructions (Addendum)
  Ms. Doxtater , Thank you for taking time to come for your Medicare Wellness Visit. I appreciate your ongoing commitment to your health goals. Please review the following plan we discussed and let me know if I can assist you in the future.   These are the goals we discussed: Goals      Patient Stated   . DIET - INCREASE WATER INTAKE (pt-stated)       This is a list of the screening recommended for you and due dates:  Health Maintenance  Topic Date Due  . Complete foot exam   02/22/2019  . Hemoglobin A1C  10/10/2019  . Eye exam for diabetics  01/17/2020  . Urine Protein Check  04/10/2020  . Tetanus Vaccine  07/24/2025  . Flu Shot  Completed  . DEXA scan (bone density measurement)  Completed  . Pneumonia vaccines  Completed

## 2019-04-22 NOTE — Assessment & Plan Note (Signed)
Well controlled on current regimen without chlorthalidone which was prescribed by cardiology but stopped  Due to intolerance. . Renal function stable, no changes today.

## 2019-04-22 NOTE — Assessment & Plan Note (Signed)
Managed with Remicaide infusions,  No recent flares.  Last colonoscopy 2017 at [facility]

## 2019-04-22 NOTE — Assessment & Plan Note (Signed)
Remains well-controlled on diet alone. Intolerant of ACE I due to cough and olmesartan due to perceived side effect of constipation ./ No proteinuria despite  Recurrent reports of "foamy urine.".  Foot exam was normal in Feb 2020. Marland Kitchen  Taking sta daily tin every other day  Due to muscle pain with daily use,  Risk of CAD high at 25%  Discussed change to pravastatin or rosuvastatin   Every other day. At last visit we reduced  aspirin to once weekly due to nosebleeds .   Lab Results  Component Value Date   HGBA1C 6.6 (H) 04/11/2019   Lab Results  Component Value Date   MICROALBUR 1.4 04/11/2019     Lab Results  Component Value Date   CHOL 227 (H) 04/11/2019   HDL 76.10 04/11/2019   LDLCALC 138 (H) 04/11/2019   LDLDIRECT 151.5 10/03/2013   TRIG 69.0 04/11/2019   CHOLHDL 3 04/11/2019

## 2019-04-22 NOTE — Assessment & Plan Note (Signed)
Worse since stopped  prn use of lorazepam.  Did not tolerate trial of Zoloft.   Melatonin not helping.  Discussed herbal teas as alternative

## 2019-04-22 NOTE — Assessment & Plan Note (Signed)
Mild , suggested by [facility] showing TR and PR.  Last one Jan 2020

## 2019-05-02 ENCOUNTER — Other Ambulatory Visit: Payer: Self-pay | Admitting: Internal Medicine

## 2019-05-09 MED ORDER — METOPROLOL SUCCINATE ER 25 MG PO TB24: 25.0000 mg | ORAL_TABLET | Freq: Every day | ORAL | 3 refills | Status: DC

## 2019-06-23 NOTE — Telephone Encounter (Signed)
Spoke with pt and scheduled her an appt for Monday to discuss. The pt also stated that she has been having some SOBr since taking the metoprolol. Pt stated that she has not stopped taking the medication because she was hoping the side effects would go away with time but they have not.

## 2019-06-26 ENCOUNTER — Ambulatory Visit: Payer: Medicare Other | Admitting: Internal Medicine

## 2019-06-29 ENCOUNTER — Ambulatory Visit: Payer: Medicare Other | Admitting: Internal Medicine

## 2019-06-29 DIAGNOSIS — K5080 Crohn's disease of both small and large intestine without complications: Secondary | ICD-10-CM | POA: Diagnosis not present

## 2019-07-04 ENCOUNTER — Ambulatory Visit (INDEPENDENT_AMBULATORY_CARE_PROVIDER_SITE_OTHER): Payer: Medicare Other | Admitting: Internal Medicine

## 2019-07-04 ENCOUNTER — Encounter: Payer: Self-pay | Admitting: Internal Medicine

## 2019-07-04 DIAGNOSIS — I10 Essential (primary) hypertension: Secondary | ICD-10-CM | POA: Diagnosis not present

## 2019-07-04 DIAGNOSIS — F411 Generalized anxiety disorder: Secondary | ICD-10-CM | POA: Diagnosis not present

## 2019-07-04 MED ORDER — BUSPIRONE HCL 10 MG PO TABS: 10.0000 mg | ORAL_TABLET | Freq: Three times a day (TID) | ORAL | 0 refills | Status: DC

## 2019-07-04 MED ORDER — LOSARTAN POTASSIUM 50 MG PO TABS: 50.0000 mg | ORAL_TABLET | Freq: Every day | ORAL | 3 refills | Status: DC

## 2019-07-04 NOTE — Patient Instructions (Signed)
I am starting you on losartan 50 mg daily.  Take it in the evening  Reduce your lorazepam use to once daily in the morning.  Start the buspirone  In the afternoon and evening.  ( 2 times daily)  Return to clinic in 2-3 weeks

## 2019-07-04 NOTE — Progress Notes (Signed)
Telephone  Note  This visit type was conducted due to national recommendations for restrictions regarding the COVID-19 pandemic (e.g. social distancing).  This format is felt to be most appropriate for this patient at this time.  All issues noted in this document were discussed and addressed.  No physical exam was performed (except for noted visual exam findings with Video Visits).   I connected with@ on 07/04/19 at  4:30 PM EST by  telephone and verified that I am speaking with the correct person using two identifiers. Location patient: home Location provider: work or home office Persons participating in the virtual visit: patient, provider  I discussed the limitations, risks, security and privacy concerns of performing an evaluation and management service by telephone and the availability of in person appointments. I also discussed with the patient that there may be a patient responsible charge related to this service. The patient expressed understanding and agreed to proceed.  Reason for visit: medication intolerance   HPI:  79 yr old female with hypertension, GAD ,  Crohn's Disease managed with bimonthly infusions of remicaide presents with elevated blood pressure after stopping metoprolol due to side effects  That started on Jan 18 ,  symptoms attributed to metoprolol  Include trouble swallowing and throat feeling swollen cough,  Chest discomfort, fatigue,  dyspnea, and throat tickle.  She read on Google that these Symptoms could  Be caused by metoprolol so she stopped the medication and states that the symptoms are slowly resolving after stopping the medication  Reviewed last medication change in January:  She stopped the chlorthalidone due to fatigue and started the metoprolol.   She also is requesting to stop taking  lorazepam because it is making her  ears ring  When she takes it, but has been taking it regularly for over a year    ROS: See pertinent positives and negatives per  HPI.  Past Medical History:  Diagnosis Date  . Arthritis   . Breast cancer ([place])   . Breast cancer, right breast ([place]) 08/24/2012   Histologic grade 1, invasive mammary carcinoma, no specific type. 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  . Crohn's disease ([place])   . GERD (gastroesophageal reflux disease)   . History of blood transfusion 1960  . Hypertension   . Personal history of radiation therapy     Past Surgical History:  Procedure Laterality Date  . BRAIN SURGERY  1980   breast biopsies, benign  . 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 BREAST BIOPSY Bilateral    - core Bx- neg  . BREAST LUMPECTOMY Right May 2014   T1c, N0; ER/PR positive, HER-2/neu not overexpressing.  Marland Kitchen BREAST SURGERY  1980's   fibro cyst both breast in [place]  . COLONOSCOPY  2017  . DILATION AND CURETTAGE OF UTERUS      Family History  Problem Relation Age of Onset  . Cancer Paternal Grandmother   . Heart disease Sister   . Diabetes Sister   . Breast cancer Neg Hx     SOCIAL HX:  reports that she has never smoked. She has never used smokeless tobacco. She reports current alcohol use of about 1.0 standard drinks of alcohol per week. She reports that she does not use drugs.   Current Outpatient Medications:  .  aspirin EC 81 MG tablet, Take 81 mg by mouth daily., Disp: , Rfl:  .  atorvastatin (LIPITOR) 10 MG tablet, Take  10 mg by mouth every other day. , Disp: , Rfl: 11 .  calcium carbonate (TUMS - DOSED IN MG ELEMENTAL CALCIUM) 500 MG chewable tablet, Chew 1 tablet by mouth daily., Disp: , Rfl:  .  chlorthalidone (HYGROTON) 25 MG tablet, Take 1 tablet (25 mg total) by mouth daily., Disp: 90 tablet, Rfl: 1 .  cholecalciferol (VITAMIN D) 1000 units tablet, Take 1,000 Units by mouth daily., Disp: , Rfl:  .  furosemide (LASIX) 20 MG tablet, TAKE 1 TABLET BY MOUTH ONCE DAILY AS NEEDED FOR FLUID RETENTION., Disp: 90 tablet, Rfl: 0 .   inFLIXimab (REMICADE) 100 MG injection, Inject 1 mg into the vein every 8 (eight) weeks., Disp: , Rfl:  .  loratadine (CLARITIN) 10 MG tablet, TAKE 1 TABLET BY MOUTH ONCE DAILY, Disp: 90 tablet, Rfl: 1 .  LORazepam (ATIVAN) 0.5 MG tablet, TAKE 1 TABLET BY MOUTH EVERY 8 HOURS AS NEEDED FOR ANXIETY -  MAX  OF  2  TABLETS  PER  DAY, Disp: 60 tablet, Rfl: 0 .  Multiple Vitamins-Minerals (MULTIVITAMIN ADULT PO), Take by mouth., Disp: , Rfl:  .  nystatin (MYCOSTATIN) 100000 UNIT/ML suspension, SWISH & SWALLOW 5 ML BY MOUTH 4 TIMES DAILY, Disp: , Rfl: 1 .  Omega-3 Fatty Acids (FISH OIL PO), Take by mouth., Disp: , Rfl:  .  polyethylene glycol powder (GLYCOLAX/MIRALAX) powder, Take 17 g by mouth 2 (two) times daily as needed., Disp: 3350 g, Rfl: 11 .  busPIRone (BUSPAR) 10 MG tablet, Take 1 tablet (10 mg total) by mouth 3 (three) times daily., Disp: 90 tablet, Rfl: 0 .  losartan (COZAAR) 50 MG tablet, Take 1 tablet (50 mg total) by mouth at bedtime., Disp: 30 tablet, Rfl: 3  EXAM:   General impression: alert, cooperative and articulate.  No signs of being in distress  Lungs: speech is fluent sentence length suggests that patient is not short of breath and not punctuated by cough, sneezing or sniffing. Marland Kitchen   Psych: affect normal.  speech is articulate and non pressured .  Denies suicidal thoughts  PSYCH/NEURO: pleasant and cooperative, no obvious depression, speech and thought processing grossly intact  ASSESSMENT AND PLAN:  Discussed the following assessment and plan:  Essential hypertension  Generalized anxiety disorder  Essential hypertension Elevated due to another medication intolerance.  not tolerating metoprolol which was started after chorlthalidone was stopped due to intolerance.  Options are becoming limited; she is aware of the need to retry some of the previously tried medications. Resume losartan 50 mg daily   Generalized anxiety disorder Did not tolerate trial of zoloft last year.  Adding buspirone for regular use to minimize use of lorazepam     I discussed the assessment and treatment plan with the patient. The patient was provided an opportunity to ask questions and all were answered. The patient agreed with the plan and demonstrated an understanding of the instructions.   The patient was advised to call back or seek an in-person evaluation if the symptoms worsen or if the condition fails to improve as anticipated.  I provided  25 minutes of non-face-to-face time during this encounter reviewing patient's current problems and past medication  trials providing counseling on the above mentioned problems , and coordination  of care . Kim Mc, MD

## 2019-07-05 NOTE — Assessment & Plan Note (Signed)
Elevated due to another medication intolerance.  not tolerating metoprolol which was started after chorlthalidone was stopped due to intolerance.  Options are becoming limited; she is aware of the need to retry some of the previously tried medications. Resume losartan 50 mg daily

## 2019-07-05 NOTE — Assessment & Plan Note (Signed)
Did not tolerate trial of zoloft last year. Adding buspirone for regular use to minimize use of lorazepam

## 2019-07-17 ENCOUNTER — Other Ambulatory Visit: Payer: Self-pay

## 2019-07-19 ENCOUNTER — Encounter: Payer: Self-pay | Admitting: Internal Medicine

## 2019-07-19 ENCOUNTER — Ambulatory Visit (INDEPENDENT_AMBULATORY_CARE_PROVIDER_SITE_OTHER): Payer: Medicare Other | Admitting: Internal Medicine

## 2019-07-19 ENCOUNTER — Other Ambulatory Visit: Payer: Self-pay

## 2019-07-19 VITALS — BP 158/92 | HR 78 | Temp 97.9°F | Resp 14 | Ht 61.5 in | Wt 148.6 lb

## 2019-07-19 DIAGNOSIS — E782 Mixed hyperlipidemia: Secondary | ICD-10-CM | POA: Diagnosis not present

## 2019-07-19 DIAGNOSIS — E1169 Type 2 diabetes mellitus with other specified complication: Secondary | ICD-10-CM | POA: Diagnosis not present

## 2019-07-19 DIAGNOSIS — I10 Essential (primary) hypertension: Secondary | ICD-10-CM

## 2019-07-19 MED ORDER — HYDRALAZINE HCL 25 MG PO TABS: 25.0000 mg | ORAL_TABLET | Freq: Three times a day (TID) | ORAL | 0 refills | Status: DC

## 2019-07-19 NOTE — Assessment & Plan Note (Signed)
I have addressed  BMI and recommended a low glycemic index diet utilizing smaller more frequent meals to increase metabolism.  I have also recommended that patient start exercising with a goal of 30 minutes of aerobic exercise a minimum of 5 days per week.  

## 2019-07-19 NOTE — Assessment & Plan Note (Signed)
Adding losartan to list of intolerances/allergies as she reports hand facial and tongue swelling (none of which is visible on exam).  Trial of hydralazine. 25 mg tid

## 2019-07-19 NOTE — Progress Notes (Signed)
Subjective:  Patient ID: Kim Sanchez, female    DOB: 27-Dec-1940  Age: 78 y.o. MRN: [id number]  CC: The primary encounter diagnosis was Mixed hyperlipidemia. Diagnoses of DM type 2 with diabetic mixed hyperlipidemia ([place]) and Essential hypertension were also pertinent to this visit.  HPI Kim Sanchez presents for 2 week follow up on hypertension .    This visit occurred during the SARS-CoV-2 public health emergency.  Safety protocols were in place, including screening questions prior to the visit, additional usage of staff PPE, and extensive cleaning of exam room while observing appropriate contact time as indicated for disinfecting solutions.   At last visit metoprolol was stopped due to patient's report of multiple side effects which she attributed to medication,  And due to lack of available new choices due to her many intolerances, losartan was restarted at 50 mg  ,  She reports  Swelling of hands,  Followed by facial swelling and at times tongue swelling.    2) Weight gain:  Patient has not exercised regularly for a year due to closure of gyms.  She has diet controlled diabetes.  Discussed role of diet and exercise at length.   Outpatient Medications Prior to Visit  Medication Sig Dispense Refill  . aspirin EC 81 MG tablet Take 81 mg by mouth daily.    Marland Kitchen atorvastatin (LIPITOR) 10 MG tablet Take 10 mg by mouth every other day.   11  . busPIRone (BUSPAR) 10 MG tablet Take 1 tablet (10 mg total) by mouth 3 (three) times daily. 90 tablet 0  . calcium carbonate (TUMS - DOSED IN MG ELEMENTAL CALCIUM) 500 MG chewable tablet Chew 1 tablet by mouth daily.    . furosemide (LASIX) 20 MG tablet TAKE 1 TABLET BY MOUTH ONCE DAILY AS NEEDED FOR FLUID RETENTION. 90 tablet 0  . inFLIXimab (REMICADE) 100 MG injection Inject 1 mg into the vein every 8 (eight) weeks.    Marland Kitchen loratadine (CLARITIN) 10 MG tablet TAKE 1 TABLET BY MOUTH ONCE DAILY 90 tablet 1  . LORazepam (ATIVAN) 0.5 MG tablet TAKE 1 TABLET BY  MOUTH EVERY 8 HOURS AS NEEDED FOR ANXIETY -  MAX  OF  2  TABLETS  PER  DAY 60 tablet 0  . Multiple Vitamins-Minerals (MULTIVITAMIN ADULT PO) Take by mouth.    . nystatin (MYCOSTATIN) 100000 UNIT/ML suspension SWISH & SWALLOW 5 ML BY MOUTH 4 TIMES DAILY  1  . Omega-3 Fatty Acids (FISH OIL PO) Take by mouth.    . polyethylene glycol powder (GLYCOLAX/MIRALAX) powder Take 17 g by mouth 2 (two) times daily as needed. 3350 g 11  . losartan (COZAAR) 50 MG tablet Take 1 tablet (50 mg total) by mouth at bedtime. 30 tablet 3  . chlorthalidone (HYGROTON) 25 MG tablet Take 1 tablet (25 mg total) by mouth daily. (Patient not taking: Reported on 07/19/2019) 90 tablet 1  . cholecalciferol (VITAMIN D) 1000 units tablet Take 1,000 Units by mouth daily.     No facility-administered medications prior to visit.    Review of Systems;  Patient denies headache, fevers, malaise, unintentional weight loss, skin rash, eye pain, sinus congestion and sinus pain, sore throat, dysphagia,  hemoptysis , cough, dyspnea, wheezing, chest pain, palpitations, orthopnea, edema, abdominal pain, nausea, melena, diarrhea, constipation, flank pain, dysuria, hematuria, urinary  Frequency, nocturia, numbness, tingling, seizures,  Focal weakness, Loss of consciousness,  Tremor, insomnia, depression, anxiety, and suicidal ideation.      Objective:  BP (!) 158/92 (  BP Location: Left Arm, Patient Position: Sitting, Cuff Size: Normal)   Pulse 78   Temp 97.9 F (36.6 C) (Temporal)   Resp 14   Ht 5' 1.5" (1.562 m)   Wt 148 lb 9.6 oz (67.4 kg)   SpO2 97%   BMI 27.62 kg/m   BP Readings from Last 3 Encounters:  07/19/19 (!) 158/92  07/04/19 (!) 167/80  03/16/19 138/77    Wt Readings from Last 3 Encounters:  07/19/19 148 lb 9.6 oz (67.4 kg)  07/04/19 144 lb (65.3 kg)  04/20/19 145 lb (65.8 kg)    General appearance: alert, cooperative and appears stated age Ears: normal TM's and external ear canals both ears Throat: lips,  mucosa, and tongue normal; teeth and gums normal Neck: no adenopathy, no carotid bruit, supple, symmetrical, trachea midline and thyroid not enlarged, symmetric, no tenderness/mass/nodules Back: symmetric, no curvature. ROM normal. No CVA tenderness. Lungs: clear to auscultation bilaterally Heart: regular rate and rhythm, S1, S2 normal, no murmur, click, rub or gallop Abdomen: soft, non-tender; bowel sounds normal; no masses,  no organomegaly Pulses: 2+ and symmetric Skin: Skin color, texture, turgor normal. No rashes or lesions Lymph nodes: Cervical, supraclavicular, and axillary nodes normal.  Lab Results  Component Value Date   HGBA1C 6.6 (H) 04/11/2019   HGBA1C 6.5 06/30/2018   HGBA1C 6.5 03/14/2018    Lab Results  Component Value Date   CREATININE 0.70 04/11/2019   CREATININE 0.74 06/30/2018   CREATININE 0.76 03/30/2018    Lab Results  Component Value Date   WBC 5.8 12/31/2016   HGB 13.5 12/31/2016   HCT 39.7 12/31/2016   PLT 215 12/31/2016   GLUCOSE 122 (H) 04/11/2019   CHOL 227 (H) 04/11/2019   TRIG 69.0 04/11/2019   HDL 76.10 04/11/2019   LDLDIRECT 151.5 10/03/2013   LDLCALC 138 (H) 04/11/2019   ALT 14 04/11/2019   AST 18 04/11/2019   NA 140 04/11/2019   K 3.6 04/11/2019   CL 102 04/11/2019   CREATININE 0.70 04/11/2019   BUN 16 04/11/2019   CO2 30 04/11/2019   TSH 2.39 09/30/2016   HGBA1C 6.6 (H) 04/11/2019   MICROALBUR 1.4 04/11/2019    MM 3D SCREEN BREAST BILATERAL  Result Date: 11/29/2018 CLINICAL DATA:  Screening. History of treated right breast cancer, status post breast conservation therapy in 2014. History of benign left breast excisional biopsy. EXAM: DIGITAL SCREENING BILATERAL MAMMOGRAM WITH TOMO AND CAD COMPARISON:  Previous exam(s). ACR Breast Density Category c: The breast tissue is heterogeneously dense, which may obscure small masses. FINDINGS: There are no findings suspicious for malignancy. Stable postsurgical changes in bilateral breasts.  Images were processed with CAD. IMPRESSION: No mammographic evidence of malignancy. A result letter of this screening mammogram will be mailed directly to the patient. RECOMMENDATION: Screening mammogram in one year. (Code:SM-B-01Y) BI-RADS CATEGORY  1: Negative. Electronically Signed   By: Fidela Salisbury M.D.   On: 11/29/2018 10:00    Assessment & Plan:   Problem List Items Addressed This Visit      Unprioritized   HLD (hyperlipidemia) - Primary   Relevant Medications   hydrALAZINE (APRESOLINE) 25 MG tablet   Other Relevant Orders   Lipid panel   Essential hypertension    Adding losartan to list of intolerances/allergies as she reports hand facial and tongue swelling (none of which is visible on exam).  Trial of hydralazine. 25 mg tid       Relevant Medications   hydrALAZINE (APRESOLINE) 25 MG tablet  DM type 2 with diabetic mixed hyperlipidemia ([place])    I have addressed  BMI and recommended a low glycemic index diet utilizing smaller more frequent meals to increase metabolism.  I have also recommended that patient start exercising with a goal of 30 minutes of aerobic exercise a minimum of 5 days per week.      Relevant Medications   hydrALAZINE (APRESOLINE) 25 MG tablet   Other Relevant Orders   Hemoglobin A1c   Comprehensive metabolic panel   Microalbumin / creatinine urine ratio      I provided  30 minutes of -face-to-face time during this encounter reviewing patient's current problems and past surgeries, labs and imaging studies, providing counseling on the above mentioned problems , and coordination  of care .  I have discontinued Kim Sanchez "AGGIE"'s cholecalciferol, chlorthalidone, and losartan. I am also having her start on hydrALAZINE. Additionally, I am having her maintain her inFLIXimab, aspirin EC, Omega-3 Fatty Acids (FISH OIL PO), calcium carbonate, atorvastatin, nystatin, polyethylene glycol powder, loratadine, LORazepam, Multiple Vitamins-Minerals  (MULTIVITAMIN ADULT PO), furosemide, and busPIRone.  Meds ordered this encounter  Medications  . hydrALAZINE (APRESOLINE) 25 MG tablet    Sig: Take 1 tablet (25 mg total) by mouth 3 (three) times daily.    Dispense:  90 tablet    Refill:  0    Medications Discontinued During This Encounter  Medication Reason  . chlorthalidone (HYGROTON) 25 MG tablet Change in therapy  . cholecalciferol (VITAMIN D) 1000 units tablet Patient Preference  . losartan (COZAAR) 50 MG tablet     Follow-up: Return in about 3 months (around 10/19/2019) for follow up diabetes.   [name], MD

## 2019-07-19 NOTE — Patient Instructions (Addendum)
Stop the losartan because it is causing swelling   We will try hydralazine 25 mg every 8 hours instead for blood pressure  Controlling your diabetes without medications takes 2 things:  Low carb diet Daily exercise   Plain cheerios  Is the lowest carb cereal .  Add artificial sweetener if needed Eggs/cheese/bacon/sausage have NO CARBS   You might want to try a premixed protein drink called Premier Protein shake for breakfast or late night snack . It is great tasting,   very low sugar/calories  and very high in protein    Nutritional analysis :  160 cal  30 g protein  1 g sugar 50% calcium needs   Available at [facility] and [employer]

## 2019-08-01 ENCOUNTER — Telehealth: Payer: Self-pay | Admitting: Internal Medicine

## 2019-08-01 MED ORDER — BISOPROLOL FUMARATE 5 MG PO TABS: 5.0000 mg | ORAL_TABLET | Freq: Every day | ORAL | 0 refills | Status: DC

## 2019-08-01 NOTE — Telephone Encounter (Signed)
Patient started taking hydrALAZINE (APRESOLINE) 25 MG tablet about a week ago. She thinks she is having side effects from medication. Following symptoms are; fast heart beat, ears ringing, hands swelling, and tired. 147/75 heart rate 80.

## 2019-08-01 NOTE — Telephone Encounter (Signed)
Patient was informed and she is willing to try it at the lowest amount (30day) and low dosage.

## 2019-08-01 NOTE — Telephone Encounter (Signed)
She can stop the medication if she cannot tolerate the symptoms.  I am running out of options to treat her blood pressure because of her intolerances.,  But if she is willing to try bisoprolol,  I will send it to her pharmacy

## 2019-08-01 NOTE — Telephone Encounter (Signed)
bisoprolol sent to local pharmacy. Lowest dose is 5 mg   Take once daily at bedtime

## 2019-08-04 DIAGNOSIS — Z20828 Contact with and (suspected) exposure to other viral communicable diseases: Secondary | ICD-10-CM | POA: Diagnosis not present

## 2019-08-25 DIAGNOSIS — K5080 Crohn's disease of both small and large intestine without complications: Secondary | ICD-10-CM | POA: Diagnosis not present

## 2019-08-28 ENCOUNTER — Other Ambulatory Visit: Payer: Self-pay | Admitting: Internal Medicine

## 2019-08-28 DIAGNOSIS — E119 Type 2 diabetes mellitus without complications: Secondary | ICD-10-CM | POA: Diagnosis not present

## 2019-08-28 DIAGNOSIS — R0602 Shortness of breath: Secondary | ICD-10-CM | POA: Diagnosis not present

## 2019-08-28 DIAGNOSIS — F411 Generalized anxiety disorder: Secondary | ICD-10-CM | POA: Diagnosis not present

## 2019-08-28 DIAGNOSIS — I10 Essential (primary) hypertension: Secondary | ICD-10-CM | POA: Diagnosis not present

## 2019-08-28 DIAGNOSIS — I272 Pulmonary hypertension, unspecified: Secondary | ICD-10-CM | POA: Diagnosis not present

## 2019-08-28 DIAGNOSIS — R002 Palpitations: Secondary | ICD-10-CM | POA: Diagnosis not present

## 2019-08-28 DIAGNOSIS — R0789 Other chest pain: Secondary | ICD-10-CM | POA: Diagnosis not present

## 2019-09-19 DIAGNOSIS — H90A32 Mixed conductive and sensorineural hearing loss, unilateral, left ear with restricted hearing on the contralateral side: Secondary | ICD-10-CM | POA: Diagnosis not present

## 2019-09-19 DIAGNOSIS — H90A22 Sensorineural hearing loss, unilateral, left ear, with restricted hearing on the contralateral side: Secondary | ICD-10-CM | POA: Diagnosis not present

## 2019-09-19 DIAGNOSIS — H903 Sensorineural hearing loss, bilateral: Secondary | ICD-10-CM | POA: Diagnosis not present

## 2019-10-10 IMAGING — MG MM DIGITAL DIAGNOSTIC BILAT W/ TOMO W/ CAD
9 of 15 series · 9 of 39 positions shown · non-contrast
Comparison: 10/01/2016 and earlier

CLINICAL DATA: RIGHT lumpectomy in 6200.

EXAM:
DIGITAL DIAGNOSTIC BILATERAL MAMMOGRAM WITH CAD AND TOMO

[R MLO (1 of 2)]
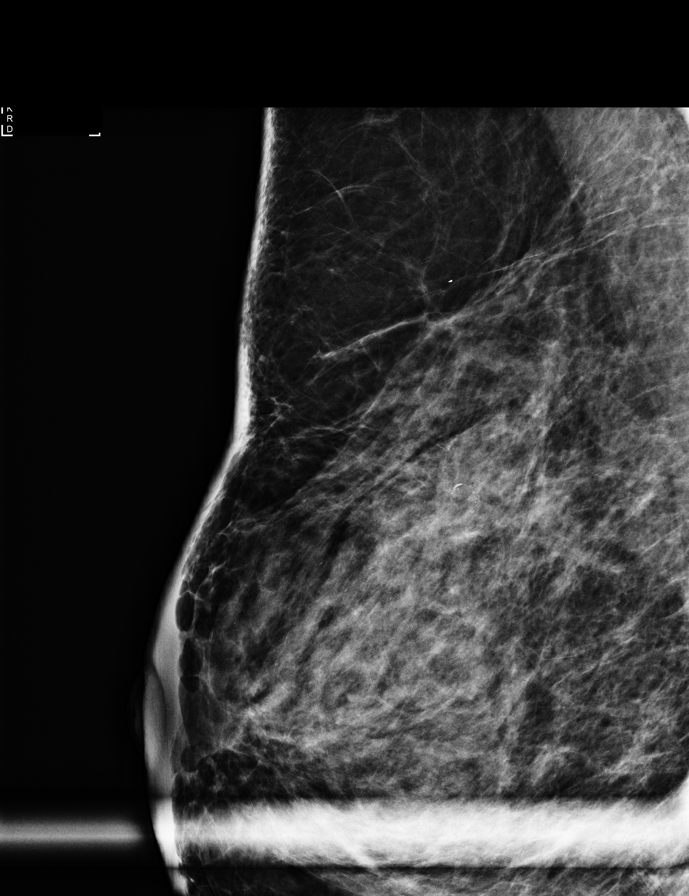

[R MLO (2 of 2)]
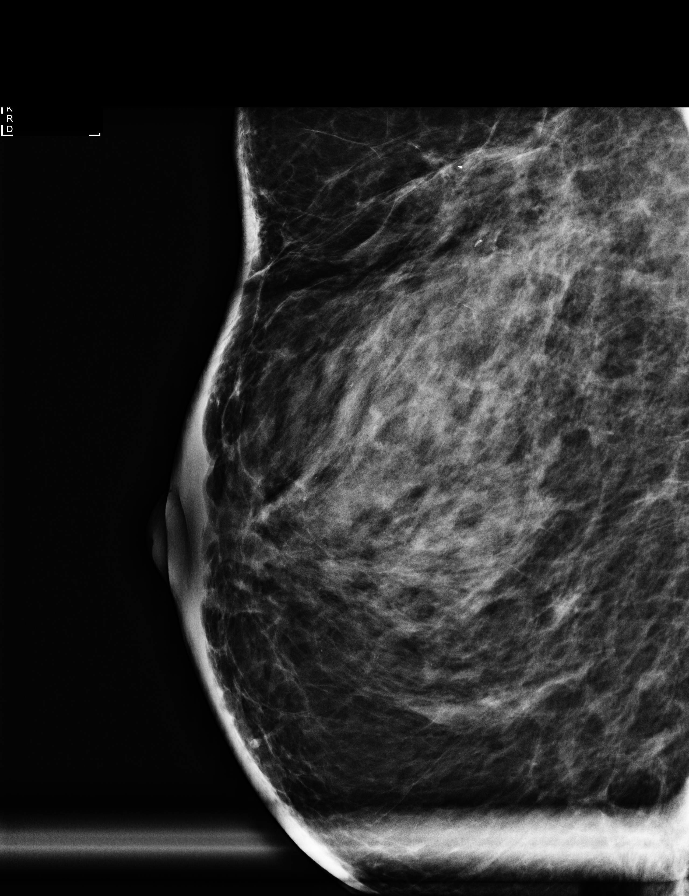

[R CC]
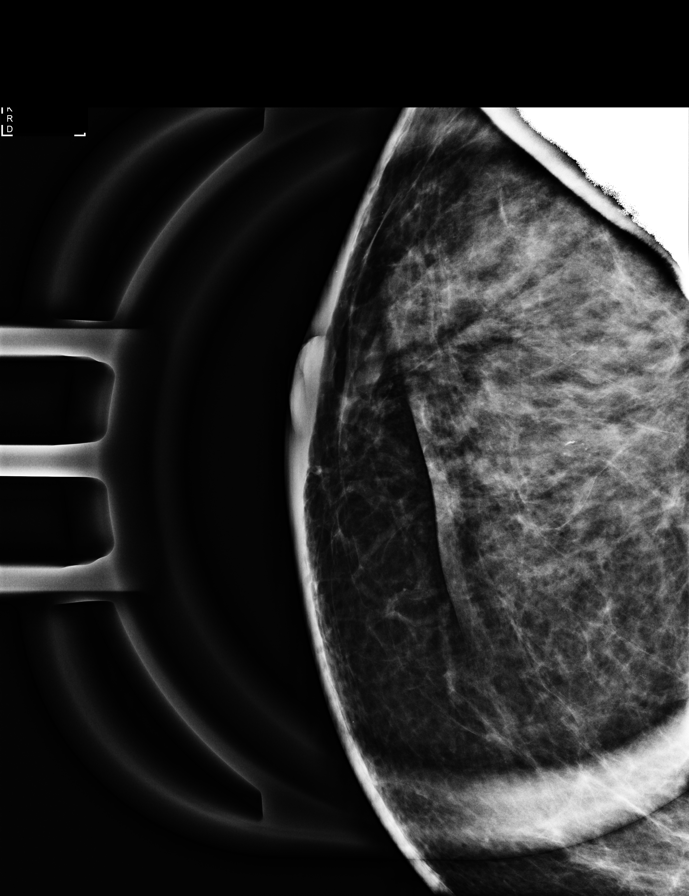

[L CC synth-2D]
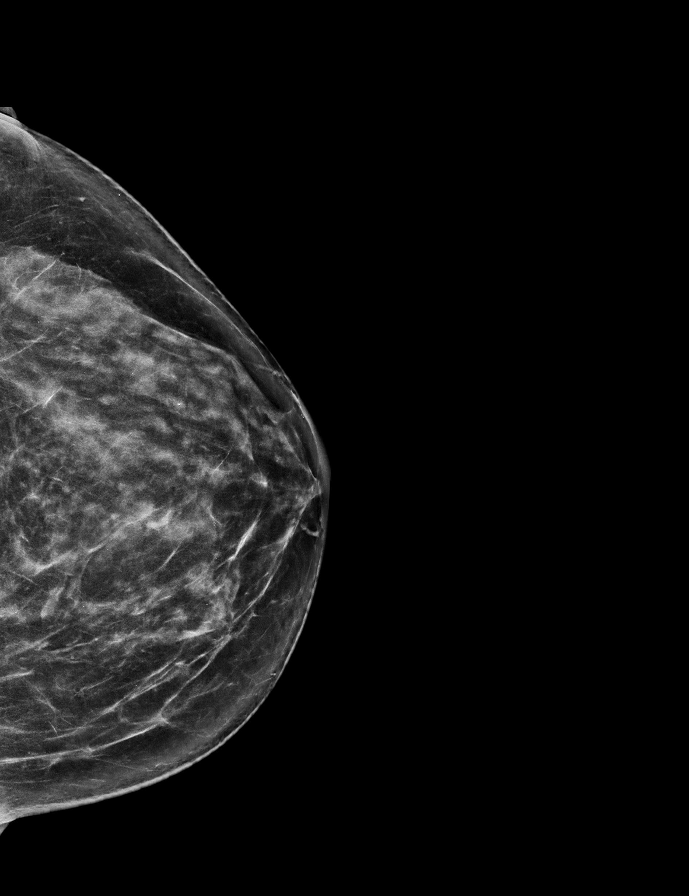

[R XCCL synth-2D]
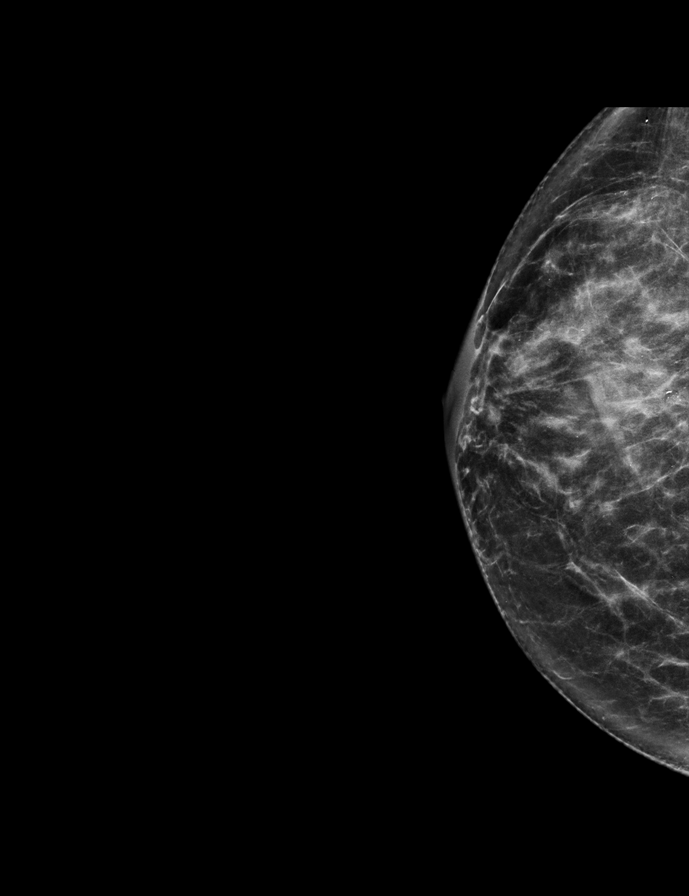

[R MLO synth-2D]
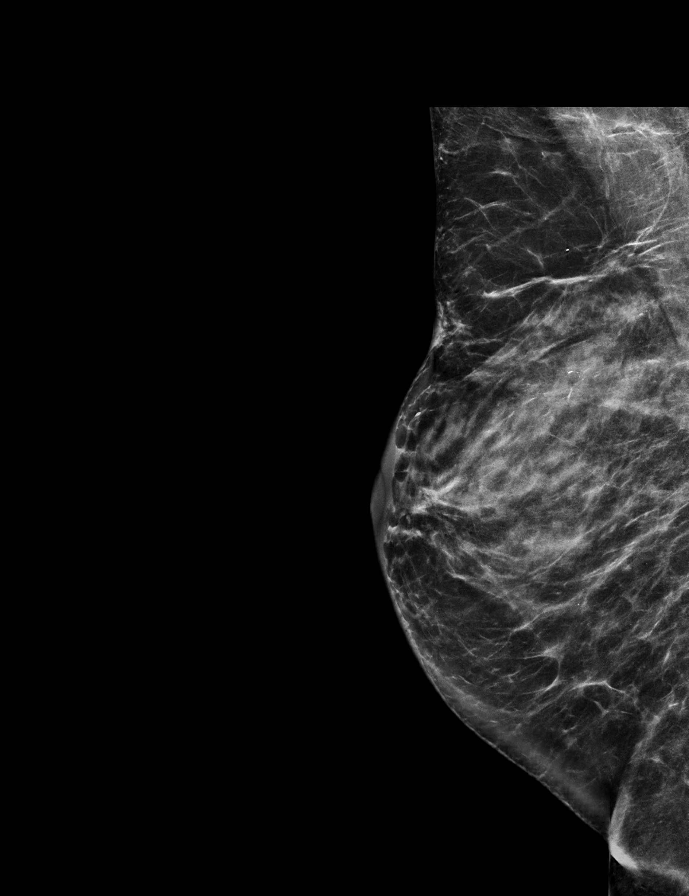

[R ML synth-2D]
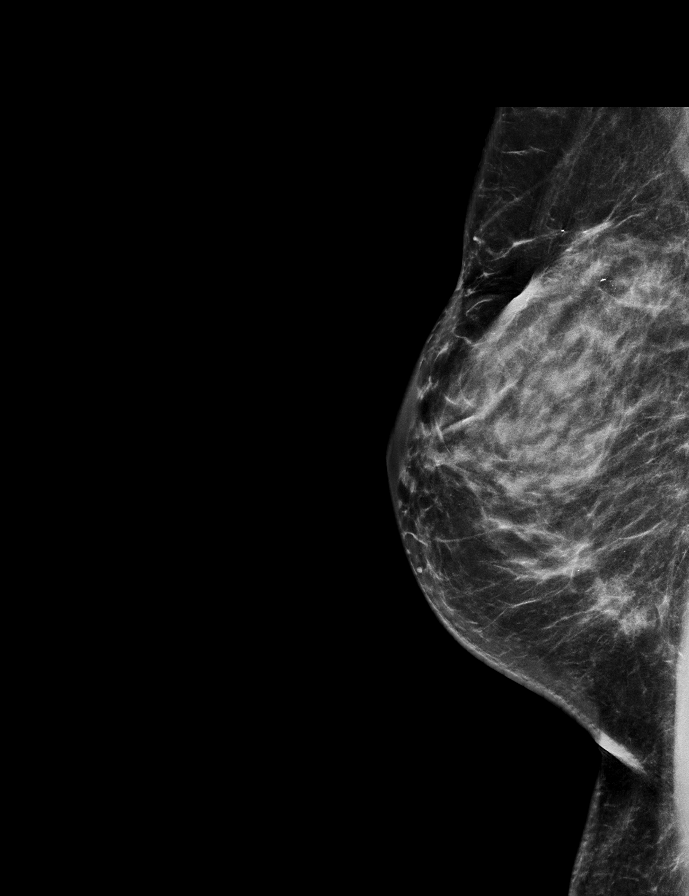

[L MLO synth-2D]
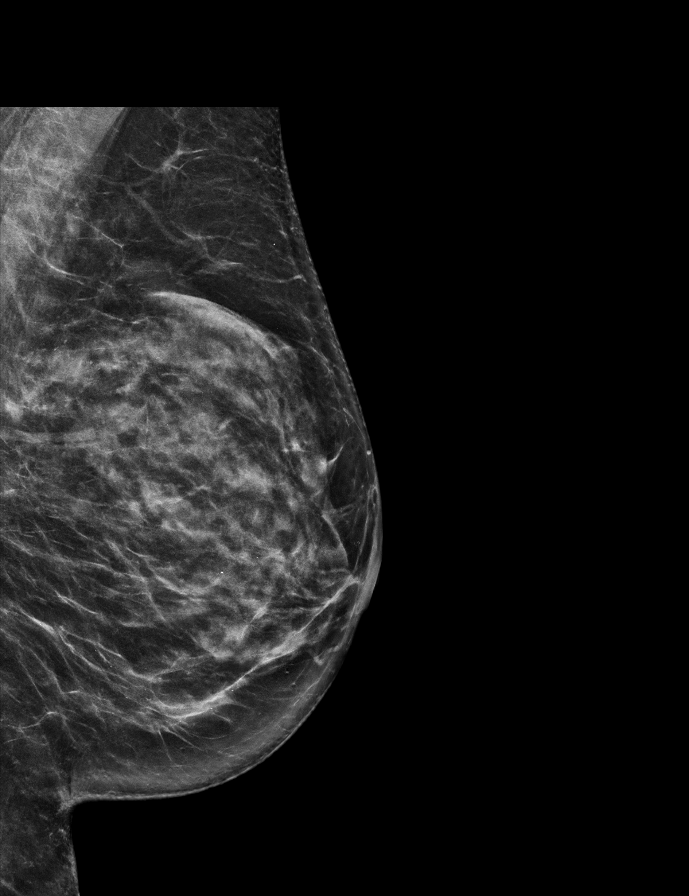

[R CC synth-2D]
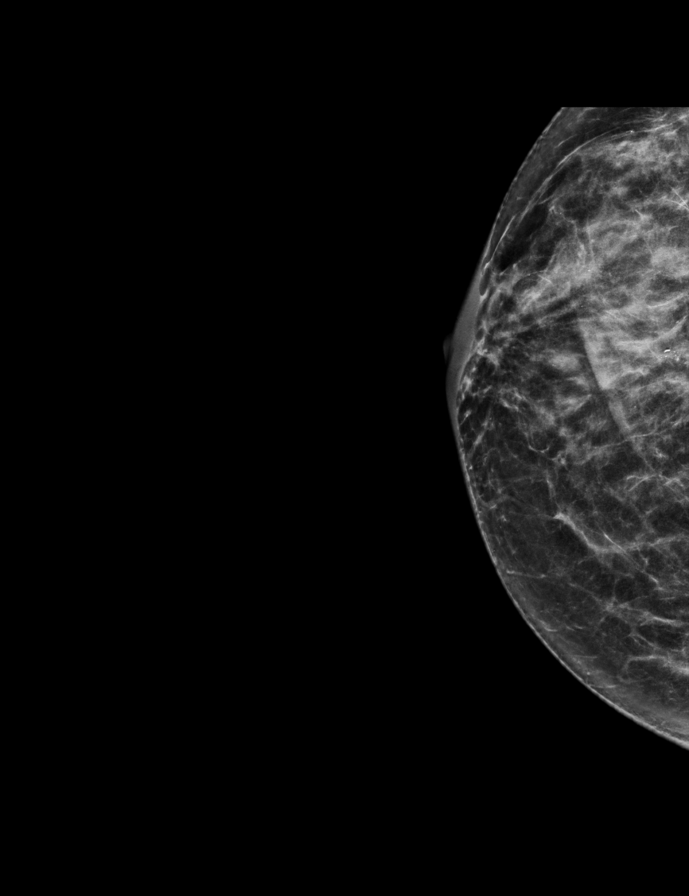

[9 of 39 positions shown; findings below may reference images not displayed]

ACR Breast Density Category c: The breast tissue is heterogeneously
dense, which may obscure small masses.
FINDINGS: Post operative changes are seen in the RIGHTbreast. Lucent centered
calcifications are identified at the lumpectomy site, consistent
with fat necrosis, confirmed on magnified views. No suspicious mass,
distortion, or microcalcifications are identified to suggest
presence of malignancy.

Mammographic images were processed with CAD.
IMPRESSION: No mammographic evidence for malignancy.

RECOMMENDATION:
Screening mammogram in one year.(Code:J3-M-4LD)

I have discussed the findings and recommendations with the patient.
Results were also provided in writing at the conclusion of the
visit. If applicable, a reminder letter will be sent to the patient
regarding the next appointment.

BI-RADS CATEGORY  2: Benign.

## 2019-10-17 ENCOUNTER — Other Ambulatory Visit (INDEPENDENT_AMBULATORY_CARE_PROVIDER_SITE_OTHER): Payer: Medicare Other

## 2019-10-17 ENCOUNTER — Other Ambulatory Visit: Payer: Self-pay

## 2019-10-17 DIAGNOSIS — E782 Mixed hyperlipidemia: Secondary | ICD-10-CM

## 2019-10-17 DIAGNOSIS — E1169 Type 2 diabetes mellitus with other specified complication: Secondary | ICD-10-CM

## 2019-10-17 LAB — LIPID PANEL
Cholesterol: 193 mg/dL (ref 0–200)
HDL: 69 mg/dL (ref >39.00)
LDL Cholesterol: 107 mg/dL — ABNORMAL HIGH (ref 0–99)
NonHDL: 123.85
Total CHOL/HDL Ratio: 3
Triglycerides: 82 mg/dL (ref 0.0–149.0)
VLDL: 16.4 mg/dL (ref 0.0–40.0)

## 2019-10-17 LAB — COMPREHENSIVE METABOLIC PANEL
ALT: 13 U/L (ref 0–35)
AST: 17 U/L (ref 0–37)
Albumin: 3.9 g/dL (ref 3.5–5.2)
Alkaline Phosphatase: 58 U/L (ref 39–117)
BUN: 14 mg/dL (ref 6–23)
CO2: 27 mEq/L (ref 19–32)
Calcium: 9.1 mg/dL (ref 8.4–10.5)
Chloride: 105 mEq/L (ref 96–112)
Creatinine, Ser: 0.67 mg/dL (ref 0.40–1.20)
GFR: 85.02 mL/min (ref >60.00)
Glucose, Bld: 125 mg/dL — ABNORMAL HIGH (ref 70–99)
Potassium: 3.9 mEq/L (ref 3.5–5.1)
Sodium: 138 mEq/L (ref 135–145)
Total Bilirubin: 0.9 mg/dL (ref 0.2–1.2)
Total Protein: 7.1 g/dL (ref 6.0–8.3)

## 2019-10-17 LAB — MICROALBUMIN / CREATININE URINE RATIO
Creatinine,U: 111.5 mg/dL
Microalb Creat Ratio: 0.7 mg/g (ref 0.0–30.0)
Microalb, Ur: 0.8 mg/dL (ref 0.0–1.9)

## 2019-10-17 LAB — HEMOGLOBIN A1C: Hgb A1c MFr Bld: 7.2 % — ABNORMAL HIGH (ref 4.6–6.5)

## 2019-10-19 ENCOUNTER — Other Ambulatory Visit: Payer: Self-pay

## 2019-10-19 ENCOUNTER — Encounter: Payer: Self-pay | Admitting: Internal Medicine

## 2019-10-19 ENCOUNTER — Ambulatory Visit (INDEPENDENT_AMBULATORY_CARE_PROVIDER_SITE_OTHER): Payer: Medicare Other | Admitting: Internal Medicine

## 2019-10-19 VITALS — BP 162/90 | HR 59 | Temp 98.2°F | Resp 15 | Ht 61.5 in | Wt 147.6 lb

## 2019-10-19 DIAGNOSIS — E119 Type 2 diabetes mellitus without complications: Secondary | ICD-10-CM | POA: Diagnosis not present

## 2019-10-19 DIAGNOSIS — R0602 Shortness of breath: Secondary | ICD-10-CM

## 2019-10-19 DIAGNOSIS — E782 Mixed hyperlipidemia: Secondary | ICD-10-CM

## 2019-10-19 DIAGNOSIS — I10 Essential (primary) hypertension: Secondary | ICD-10-CM

## 2019-10-19 DIAGNOSIS — E1169 Type 2 diabetes mellitus with other specified complication: Secondary | ICD-10-CM | POA: Diagnosis not present

## 2019-10-19 MED ORDER — CHLORTHALIDONE 25 MG PO TABS: 25.0000 mg | ORAL_TABLET | Freq: Every day | ORAL | 0 refills | Status: DC

## 2019-10-19 NOTE — Patient Instructions (Addendum)
Your heel pain may be coming from a bone spur   I recommend seeing a podiatrist  If the pain continues ,  And I saw Dr Daylene Katayama at [facility] .   Your blood pressure is too high  On 2.5 bisoprolol alone.  We are adding 1/2 tablet of chlorthalidone  Daily in the morning. HAVE AN OPEN MIND!      Your weakness and fatigue is due to lack of exercise.  You need to start walking daily to prevent deterioration of muscle strength  Start with 15 minutes daily  And work your way up to 30 minutes gradually  Your diet does not have enough protein in it.  You need at least 60  Grams daily .  Try adding eggs,  Cheese and [place] as we discussed  Walking and more protein will lower your A1c.    Try [employer] for home [employer]  [employer]s.   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

## 2019-10-19 NOTE — Progress Notes (Signed)
Subjective:  Patient ID: Kim Sanchez, female    DOB: 1941/03/11  Age: 79 y.o. MRN: [id number]  CC: The primary encounter diagnosis was DM type 2 with diabetic mixed hyperlipidemia ([place]). Diagnoses of Diabetes mellitus with no complication ([place]), Essential hypertension, and Shortness of breath were also pertinent to this visit.  HPI Kim Sanchez presents for follow up on type 2 DM,  HTN and hyperlipidemia This visit occurred during the SARS-CoV-2 public health emergency.  Safety protocols were in place, including screening questions prior to the visit, additional usage of staff PPE, and extensive cleaning of exam room while observing appropriate contact time as indicated for disinfecting solutions.    1) HTN:  Elevated due to multiple multiple drug intolerances  Including metoprolol, amlodipine,  htz chlorthalidone lisinopril and losartan.  At last encounter hydralazine was started,  but she has stopped this medication,  Due to reports of adverse effects including  head pressure ,  Cough, and tachycardia,  As well as  tongue swelling and tongue coating.  She has reduced her  bisoprolol to 1/2 tablet due to ihigher doses causing bradycardia and restless legs.  2) General:  She continues to report a dry cough and a tickle in her throat.  She feels tired all the time,  And feels she has gained weight     3) Type 2 DM:   She  feels poorly, is  not  exercising regularly or trying to lose weight. Checking  blood sugars less than once daily at variable times, usually only if she feels she may be having a hypoglycemic event. .  BS have been under 130 fasting and < 150 post prandially.  Denies any recent hypoglyemic events.  Taking   medications as directed. Following a carbohydrate modified diet 6 days per week. Denies numbness, burning and tingling of extremities. Appetite is fair.   Outpatient Medications Prior to Visit  Medication Sig Dispense Refill  . aspirin EC 81 MG tablet Take 81 mg by mouth  daily.    Marland Kitchen atorvastatin (LIPITOR) 10 MG tablet Take 10 mg by mouth every other day.   11  . bisoprolol (ZEBETA) 5 MG tablet Take 1 tablet by mouth once daily (Patient taking differently: Take 2.5 mg by mouth daily. ) 30 tablet 0  . calcium carbonate (TUMS - DOSED IN MG ELEMENTAL CALCIUM) 500 MG chewable tablet Chew 1 tablet by mouth daily.    . furosemide (LASIX) 20 MG tablet TAKE 1 TABLET BY MOUTH ONCE DAILY AS NEEDED FOR FLUID RETENTION. 90 tablet 0  . inFLIXimab (REMICADE) 100 MG injection Inject 1 mg into the vein every 8 (eight) weeks.    Marland Kitchen loratadine (CLARITIN) 10 MG tablet TAKE 1 TABLET BY MOUTH ONCE DAILY 90 tablet 1  . LORazepam (ATIVAN) 0.5 MG tablet TAKE 1 TABLET BY MOUTH EVERY 8 HOURS AS NEEDED FOR ANXIETY -  MAX  OF  2  TABLETS  PER  DAY 60 tablet 0  . Multiple Vitamins-Minerals (MULTIVITAMIN ADULT PO) Take by mouth.    . nystatin (MYCOSTATIN) 100000 UNIT/ML suspension SWISH & SWALLOW 5 ML BY MOUTH 4 TIMES DAILY  1  . Omega-3 Fatty Acids (FISH OIL PO) Take by mouth.    . polyethylene glycol powder (GLYCOLAX/MIRALAX) powder Take 17 g by mouth 2 (two) times daily as needed. 3350 g 11  . bisoprolol (ZEBETA) 5 MG tablet Take by mouth.    . busPIRone (BUSPAR) 10 MG tablet Take 1 tablet (10 mg total)  by mouth 3 (three) times daily. (Patient not taking: Reported on 10/19/2019) 90 tablet 0  . hydrALAZINE (APRESOLINE) 25 MG tablet Take 1 tablet (25 mg total) by mouth 3 (three) times daily. (Patient not taking: Reported on 10/19/2019) 90 tablet 0   No facility-administered medications prior to visit.    Review of Systems;  Patient denies headache, fevers, malaise, unintentional weight loss, skin rash, eye pain, sinus congestion and sinus pain, sore throat, dysphagia,  hemoptysis , cough, dyspnea, wheezing, chest pain, palpitations, orthopnea, edema, abdominal pain, nausea, melena, diarrhea, constipation, flank pain, dysuria, hematuria, urinary  Frequency, nocturia, numbness, tingling,  seizures,  Focal weakness, Loss of consciousness,  Tremor, insomnia, depression, anxiety, and suicidal ideation.      Objective:  BP (!) 162/90 (BP Location: Left Arm, Patient Position: Sitting, Cuff Size: Normal)   Pulse (!) 59   Temp 98.2 F (36.8 C) (Temporal)   Resp 15   Ht 5' 1.5" (1.562 m)   Wt 147 lb 9.6 oz (67 kg)   SpO2 98%   BMI 27.44 kg/m   BP Readings from Last 3 Encounters:  10/19/19 (!) 162/90  07/19/19 (!) 158/92  07/04/19 (!) 167/80    Wt Readings from Last 3 Encounters:  10/19/19 147 lb 9.6 oz (67 kg)  07/19/19 148 lb 9.6 oz (67.4 kg)  07/04/19 144 lb (65.3 kg)    General appearance: alert, cooperative and appears stated age Ears: normal TM's and external ear canals both ears Throat: lips, mucosa, and tongue normal; teeth and gums normal Neck: no adenopathy, no carotid bruit, supple, symmetrical, trachea midline and thyroid not enlarged, symmetric, no tenderness/mass/nodules Back: symmetric, no curvature. ROM normal. No CVA tenderness. Lungs: clear to auscultation bilaterally Heart: regular rate and rhythm, S1, S2 normal, no murmur, click, rub or gallop Abdomen: soft, non-tender; bowel sounds normal; no masses,  no organomegaly Pulses: 2+ and symmetric Skin: Skin color, texture, turgor normal. No rashes or lesions Lymph nodes: Cervical, supraclavicular, and axillary nodes normal.  Lab Results  Component Value Date   HGBA1C 7.2 (H) 10/17/2019   HGBA1C 6.6 (H) 04/11/2019   HGBA1C 6.5 06/30/2018    Lab Results  Component Value Date   CREATININE 0.67 10/17/2019   CREATININE 0.70 04/11/2019   CREATININE 0.74 06/30/2018    Lab Results  Component Value Date   WBC 5.8 12/31/2016   HGB 13.5 12/31/2016   HCT 39.7 12/31/2016   PLT 215 12/31/2016   GLUCOSE 125 (H) 10/17/2019   CHOL 193 10/17/2019   TRIG 82.0 10/17/2019   HDL 69.00 10/17/2019   LDLDIRECT 151.5 10/03/2013   LDLCALC 107 (H) 10/17/2019   ALT 13 10/17/2019   AST 17 10/17/2019   NA  138 10/17/2019   K 3.9 10/17/2019   CL 105 10/17/2019   CREATININE 0.67 10/17/2019   BUN 14 10/17/2019   CO2 27 10/17/2019   TSH 2.39 09/30/2016   HGBA1C 7.2 (H) 10/17/2019   MICROALBUR 0.8 10/17/2019      Assessment & Plan:   Problem List Items Addressed This Visit      Unprioritized   RESOLVED: Diabetes mellitus with no complication ([place])           DM type 2 with diabetic mixed hyperlipidemia ([place]) - Primary    Her diabetes remains well-controlled on diet alone. She is intolerant of ACE I and ARBs due to cough and olmesartan due to perceived side effect of constipation ./ No proteinuria despite  Recurrent reports of "foamy urine.".  Foot exam is normal  .  Taking a atorvastatin every other day  Due to muscle pain with daily use,  And use of aspirin reduced to once weekly due to nosebleeds .    Risk of CAD is 25%  Lab Results  Component Value Date   HGBA1C 7.2 (H) 10/17/2019    Lab Results  Component Value Date   CHOL 193 10/17/2019   HDL 69.00 10/17/2019   LDLCALC 107 (H) 10/17/2019   LDLDIRECT 151.5 10/03/2013   TRIG 82.0 10/17/2019   CHOLHDL 3 10/17/2019         Relevant Medications   chlorthalidone (HYGROTON) 25 MG tablet   Other Relevant Orders   Hemoglobin A1c   Comprehensive metabolic panel   Essential hypertension    Adding losartan to list of intolerances/allergies as she reports hand facial and tongue swelling (none of which is visible on exam).  Trial of hydralazine not tolerated either due to various unrelated symptoms which are unlikely to be due to the medication . I again explained that we have run out of options due to her perceived intolerances and resulting bradycardia with full dose of bisoprolol.  Will resume chlorthalidone as a trial       Relevant Medications   chlorthalidone (HYGROTON) 25 MG tablet   Shortness of breath    Encouraged to begin a formal walking program as she has become deconditioned          I provided  30 minutes of   face-to-face time during this encounter reviewing patient's current problems and past surgeries, labs and imaging studies, providing counseling on the above mentioned problems , and coordination  of care .  I am having Kim [place] I. Maahs "AGGIE" start on chlorthalidone. I am also having her maintain her inFLIXimab, aspirin EC, Omega-3 Fatty Acids (FISH OIL PO), calcium carbonate, atorvastatin, nystatin, polyethylene glycol powder, loratadine, LORazepam, Multiple Vitamins-Minerals (MULTIVITAMIN ADULT PO), furosemide, busPIRone, hydrALAZINE, and bisoprolol.  Meds ordered this encounter  Medications  . chlorthalidone (HYGROTON) 25 MG tablet    Sig: Take 1 tablet (25 mg total) by mouth daily.    Dispense:  30 tablet    Refill:  0    Medications Discontinued During This Encounter  Medication Reason  . bisoprolol (ZEBETA) 5 MG tablet Error    Follow-up: Return in about 3 months (around 01/19/2020) for follow up diabetes.   [name], MD

## 2019-10-22 NOTE — Assessment & Plan Note (Signed)
Her diabetes remains well-controlled on diet alone. She is intolerant of ACE I and ARBs due to cough and olmesartan due to perceived side effect of constipation ./ No proteinuria despite  Recurrent reports of "foamy urine.".  Foot exam is normal  .  Taking a atorvastatin every other day  Due to muscle pain with daily use,  And use of aspirin reduced to once weekly due to nosebleeds .    Risk of CAD is 25%  Lab Results  Component Value Date   HGBA1C 7.2 (H) 10/17/2019    Lab Results  Component Value Date   CHOL 193 10/17/2019   HDL 69.00 10/17/2019   LDLCALC 107 (H) 10/17/2019   LDLDIRECT 151.5 10/03/2013   TRIG 82.0 10/17/2019   CHOLHDL 3 10/17/2019

## 2019-10-22 NOTE — Assessment & Plan Note (Addendum)
Adding losartan to list of intolerances/allergies as she reports hand facial and tongue swelling (none of which is visible on exam).  Trial of hydralazine not tolerated either due to various unrelated symptoms which are unlikely to be due to the medication . I again explained that we have run out of options due to her perceived intolerances and resulting bradycardia with full dose of bisoprolol.  Will resume chlorthalidone as a trial

## 2019-10-22 NOTE — Assessment & Plan Note (Signed)
Encouraged to begin a formal walking program as she has become deconditioned

## 2019-10-27 ENCOUNTER — Other Ambulatory Visit: Payer: Self-pay | Admitting: Internal Medicine

## 2019-10-31 ENCOUNTER — Ambulatory Visit (INDEPENDENT_AMBULATORY_CARE_PROVIDER_SITE_OTHER): Payer: Medicare Other | Admitting: Podiatry

## 2019-10-31 ENCOUNTER — Encounter: Payer: Self-pay | Admitting: Podiatry

## 2019-10-31 ENCOUNTER — Other Ambulatory Visit: Payer: Self-pay

## 2019-10-31 ENCOUNTER — Ambulatory Visit (INDEPENDENT_AMBULATORY_CARE_PROVIDER_SITE_OTHER): Payer: Medicare Other

## 2019-10-31 DIAGNOSIS — M722 Plantar fascial fibromatosis: Secondary | ICD-10-CM | POA: Diagnosis not present

## 2019-10-31 NOTE — Progress Notes (Signed)
   Subjective: 79 y.o. female as a new patient for evaluation of right heel pain is been going on for the past 1-2 months now.  She does recall an event where she was working in her garage more than regular and she began to notice pain at that time.  She has been icing her foot and wearing good supportive shoes and doing stretching exercises and taking OTC Motrin as needed for the pain which she does state helps.   Past Medical History:  Diagnosis Date  . Arthritis   . Breast cancer ([place])   . Breast cancer, right breast ([place]) 08/24/2012   Histologic grade 1, invasive mammary carcinoma, no specific type. 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  . Crohn's disease ([place])   . GERD (gastroesophageal reflux disease)   . History of blood transfusion 1960  . Hypertension   . Personal history of radiation therapy      Objective: Physical Exam General: The patient is alert and oriented x3 in no acute distress.  Dermatology: Skin is warm, dry and supple bilateral lower extremities. Negative for open lesions or macerations bilateral.   Vascular: Dorsalis Pedis and Posterior Tibial pulses palpable bilateral.  Capillary fill time is immediate to all digits.  Neurological: Epicritic and protective threshold intact bilateral.   Musculoskeletal: Tenderness to palpation to the plantar aspect of the right heel along the plantar fascia. All other joints range of motion within normal limits bilateral. Strength 5/5 in all groups bilateral.   Radiographic exam: Normal osseous mineralization. Joint spaces preserved. No fracture/dislocation/boney destruction. No other soft tissue abnormalities or radiopaque foreign bodies.   Assessment: 1. Plantar fasciitis right  Plan of Care:  1. Patient evaluated. Xrays reviewed.   2. Injection of 0.5cc Celestone soluspan injected into the right plantar fascia  3.  Patient declined any prescription medication  today 4.  Continue OTC Motrin as needed 5. Plantar fascial band(s) dispensed 6. Instructed patient regarding therapies and modalities at home to alleviate symptoms.  7. Return to clinic in 4 weeks.     Edrick Kins, DPM [facility]  Dr. Edrick Kins, DPM    [address], [place] [address]                Office [phone]  Fax [phone]

## 2019-11-30 ENCOUNTER — Ambulatory Visit
Admission: RE | Admit: 2019-11-30 | Discharge: 2019-11-30 | Disposition: A | Discharge status: Home / Self Care | Payer: Medicare Other | Source: Ambulatory Visit | Attending: Oncology | Admitting: Oncology

## 2019-11-30 ENCOUNTER — Other Ambulatory Visit: Payer: Self-pay

## 2019-11-30 DIAGNOSIS — Z170 Estrogen receptor positive status [ER+]: Secondary | ICD-10-CM | POA: Insufficient documentation

## 2019-11-30 DIAGNOSIS — C50911 Malignant neoplasm of unspecified site of right female breast: Secondary | ICD-10-CM | POA: Insufficient documentation

## 2019-11-30 DIAGNOSIS — Z1231 Encounter for screening mammogram for malignant neoplasm of breast: Secondary | ICD-10-CM | POA: Diagnosis not present

## 2019-12-01 ENCOUNTER — Ambulatory Visit (INDEPENDENT_AMBULATORY_CARE_PROVIDER_SITE_OTHER): Payer: Medicare Other | Admitting: Nurse Practitioner

## 2019-12-01 ENCOUNTER — Encounter: Payer: Self-pay | Admitting: Nurse Practitioner

## 2019-12-01 VITALS — BP 132/76 | HR 60 | Temp 97.7°F | Ht 61.75 in | Wt 146.0 lb

## 2019-12-01 DIAGNOSIS — R3989 Other symptoms and signs involving the genitourinary system: Secondary | ICD-10-CM | POA: Diagnosis not present

## 2019-12-01 DIAGNOSIS — K5909 Other constipation: Secondary | ICD-10-CM | POA: Diagnosis not present

## 2019-12-01 LAB — POCT URINALYSIS DIPSTICK
Bilirubin, UA: NEGATIVE
Blood, UA: NEGATIVE
Glucose, UA: NEGATIVE
Ketones, UA: NEGATIVE
Leukocytes, UA: NEGATIVE
Nitrite, UA: NEGATIVE
Protein, UA: NEGATIVE
Spec Grav, UA: 1.01 (ref 1.010–1.025)
Urobilinogen, UA: NEGATIVE E.U./dL — AB
pH, UA: 6 (ref 5.0–8.0)

## 2019-12-01 LAB — URINALYSIS, MICROSCOPIC ONLY

## 2019-12-01 NOTE — Progress Notes (Signed)
Established Patient Office Visit  Subjective:  Patient ID: Kim Sanchez, female    DOB: 09-30-1940  Age: 79 y.o. MRN: [id number]  CC:  Chief Complaint  Patient presents with   Acute Visit    urine discoloration    HPI Kim Sanchez is a 79 yo who presents for discomfort left groin and lower back and urine looked dark and odor for 2 days ago. Urine is normal today. Pain is gone.  No vaginal discharge or complaints.  she has chronic constipation and is she takes MiraLAX daily with smooth move tea and has a small bowel movement every day.  Could not afford Linzess. She has an appt with  her GI doctor 01/05/2020. She discloses that her family member had pancreatic cancer and she became worried about her pancreas.  She has no epigastric pain, or thoracic back pain.  Normal appetite, diet, and weight.  Wt Readings from Last 3 Encounters:  12/01/19 146 lb (66.2 kg)  10/19/19 147 lb 9.6 oz (67 kg)  07/19/19 148 lb 9.6 oz (67.4 kg)    Past Medical History:  Diagnosis Date   Arthritis    Breast cancer ([place])    Breast cancer, right breast ([place]) 08/24/2012   Histologic grade 1, invasive mammary carcinoma, no specific type. 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   Crohn's disease ([place])    GERD (gastroesophageal reflux disease)    History of blood transfusion 1960   Hypertension    Personal history of radiation therapy     Past Surgical History:  Procedure Laterality Date   BRAIN SURGERY  1980   breast biopsies, benign   BREAST BIOPSY Right 2014   Invasive Mammory Carcinoma   BREAST BIOPSY Bilateral    - core Bx- neg   BREAST LUMPECTOMY Right May 2014   T1c, N0; ER/PR positive, HER-2/neu not overexpressing.   BREAST SURGERY  1980's   fibro cyst both breast in [place]   COLONOSCOPY  2017   DILATION AND CURETTAGE OF UTERUS      Family History  Problem Relation Age of Onset   Cancer Paternal Grandmother    Heart  disease Sister    Diabetes Sister    Breast cancer Neg Hx     Social History   Socioeconomic History   Marital status: Married    Spouse name: Not on file   Number of children: Not on file   Years of education: Not on file   Highest education level: Not on file  Occupational History   Not on file  Tobacco Use   Smoking status: Never Smoker   Smokeless tobacco: Never Used  Vaping Use   Vaping Use: Never used  Substance and Sexual Activity   Alcohol use: Yes    Alcohol/week: 1.0 standard drink    Types: 1 Glasses of wine per week    Comment: OCC   Drug use: No   Sexual activity: Not Currently  Other Topics Concern   Not on file  Social History Narrative   Not on file   Social Determinants of Health   Financial Resource Strain:    Difficulty of Paying Living Expenses:   Food Insecurity:    Worried About Charity fundraiser in the Last Year:    Arboriculturist in the Last Year:   Transportation Needs:    Film/video editor (Medical):    Lack of Transportation (Non-Medical):   Physical Activity:  Days of Exercise per Week:    Minutes of Exercise per Session:   Stress:    Feeling of Stress :   Social Connections:    Frequency of Communication with Friends and Family:    Frequency of Social Gatherings with Friends and Family:    Attends Religious Services:    Active Member of Clubs or Organizations:    Attends Music therapist:    Marital Status:   Intimate Partner Violence:    Fear of Current or Ex-Partner:    Emotionally Abused:    Physically Abused:    Sexually Abused:     Outpatient Medications Prior to Visit  Medication Sig Dispense Refill   aspirin EC 81 MG tablet Take 81 mg by mouth daily.     atorvastatin (LIPITOR) 10 MG tablet Take by mouth every other day.   11   bisoprolol (ZEBETA) 5 MG tablet Take 1 tablet by mouth once daily 30 tablet 0   calcium carbonate (TUMS - DOSED IN MG ELEMENTAL  CALCIUM) 500 MG chewable tablet Chew 1 tablet by mouth daily.     chlorthalidone (HYGROTON) 25 MG tablet Take 1 tablet (25 mg total) by mouth daily. 30 tablet 0   furosemide (LASIX) 20 MG tablet TAKE 1 TABLET BY MOUTH ONCE DAILY AS NEEDED FOR FLUID RETENTION. 90 tablet 0   inFLIXimab (REMICADE) 100 MG injection Inject 1 mg into the vein every 8 (eight) weeks.     LORazepam (ATIVAN) 0.5 MG tablet TAKE 1 TABLET BY MOUTH EVERY 8 HOURS AS NEEDED FOR ANXIETY -  MAX  OF  2  TABLETS  PER  DAY 60 tablet 0   Multiple Vitamins-Minerals (MULTIVITAMIN ADULT PO) Take by mouth.     nystatin (MYCOSTATIN) 100000 UNIT/ML suspension SWISH & SWALLOW 5 ML BY MOUTH 4 TIMES DAILY  1   Omega-3 Fatty Acids (FISH OIL PO) Take by mouth.     polyethylene glycol powder (GLYCOLAX/MIRALAX) powder Take 17 g by mouth 2 (two) times daily as needed. 3350 g 11   busPIRone (BUSPAR) 10 MG tablet Take 1 tablet (10 mg total) by mouth 3 (three) times daily. (Patient not taking: Reported on 10/19/2019) 90 tablet 0   hydrALAZINE (APRESOLINE) 25 MG tablet Take 1 tablet (25 mg total) by mouth 3 (three) times daily. (Patient not taking: Reported on 10/19/2019) 90 tablet 0   loratadine (CLARITIN) 10 MG tablet TAKE 1 TABLET BY MOUTH ONCE DAILY 90 tablet 1   No facility-administered medications prior to visit.    Allergies  Allergen Reactions   Chlorthalidone Shortness Of Breath   Letrozole Anaphylaxis and Itching   Methocarbamol Shortness Of Breath and Swelling   Metoprolol Shortness Of Breath    Cough, chest discomfort   Amlodipine Itching   Exemestane Nausea Only   Hydrochlorothiazide Other (See Comments)   Lexapro [Escitalopram Oxalate] Other (See Comments)    intolerance   Lisinopril Cough   Losartan     Facial swelling,  Tongue swelling    Sulfa Antibiotics Other (See Comments)  : Review of systems: Pertinent positives noted in history of present illness otherwise negative.   Objective:    Physical  Exam Vitals reviewed.  Constitutional:      Appearance: Normal appearance. She is obese.  HENT:     Head: Normocephalic.  Cardiovascular:     Rate and Rhythm: Normal rate and regular rhythm.     Pulses: Normal pulses.     Heart sounds: Normal heart sounds.  Pulmonary:  Effort: Pulmonary effort is normal.     Breath sounds: Normal breath sounds.  Abdominal:     Palpations: Abdomen is soft.     Tenderness: There is no abdominal tenderness.  Skin:    General: Skin is warm and dry.  Neurological:     Mental Status: She is alert and oriented to person, place, and time.  Psychiatric:        Mood and Affect: Mood normal.        Behavior: Behavior normal.     BP (!) 132/76    Pulse 60    Temp 97.7 F (36.5 C) (Oral)    Ht 5' 1.75" (1.568 m)    Wt 146 lb (66.2 kg)    SpO2 97%    BMI 26.92 kg/m  Wt Readings from Last 3 Encounters:  12/01/19 146 lb (66.2 kg)  10/19/19 147 lb 9.6 oz (67 kg)  07/19/19 148 lb 9.6 oz (67.4 kg)     Health Maintenance Due  Topic Date Due   Hepatitis C Screening  Never done   INFLUENZA VACCINE  12/03/2019    There are no preventive care reminders to display for this patient.  Lab Results  Component Value Date   TSH 2.39 09/30/2016   Lab Results  Component Value Date   WBC 5.8 12/31/2016   HGB 13.5 12/31/2016   HCT 39.7 12/31/2016   MCV 91.1 12/31/2016   PLT 215 12/31/2016   Lab Results  Component Value Date   NA 138 10/17/2019   K 3.9 10/17/2019   CO2 27 10/17/2019   GLUCOSE 125 (H) 10/17/2019   BUN 14 10/17/2019   CREATININE 0.67 10/17/2019   BILITOT 0.9 10/17/2019   ALKPHOS 58 10/17/2019   AST 17 10/17/2019   ALT 13 10/17/2019   PROT 7.1 10/17/2019   ALBUMIN 3.9 10/17/2019   CALCIUM 9.1 10/17/2019   ANIONGAP 7 12/31/2016   GFR 85.02 10/17/2019   Lab Results  Component Value Date   CHOL 193 10/17/2019   Lab Results  Component Value Date   HDL 69.00 10/17/2019   Lab Results  Component Value Date   LDLCALC 107 (H)  10/17/2019   Lab Results  Component Value Date   TRIG 82.0 10/17/2019   Lab Results  Component Value Date   CHOLHDL 3 10/17/2019   Lab Results  Component Value Date   HGBA1C 7.2 (H) 10/17/2019      Assessment & Plan:   Problem List Items Addressed This Visit      Digestive   Chronic constipation     Other   Urine discoloration - Primary   Relevant Orders   POCT Urinalysis Dipstick (Completed)   Urine Culture (Completed)   Urine Microscopic Only (Completed)    Your urine dipstick was normal today.  We have a urinalysis and culture pending.  You do not have a physical exam signs of a urinary tract infection.  We discussed constipation today.  Continue with your MiraLAX every day along with the Smooth Move Tea. You might try adding a Senokot stimulant laxative every third day as needed to give you a more complete BM. Please see your GI provider as planned January 05, 2020.  No orders of the defined types were placed in this encounter. .  Follow-up: Return if symptoms worsen or fail to improve.   This visit occurred during the SARS-CoV-2 public health emergency.  Safety protocols were in place, including screening questions prior to the visit, additional usage of  staff PPE, and extensive cleaning of exam room while observing appropriate contact time as indicated for disinfecting solutions.   Denice Paradise, NP

## 2019-12-01 NOTE — Patient Instructions (Addendum)
Your urine dipstick was normal today.  We have a urinalysis and culture pending.  You do not have a physical exam signs of a urinary tract infection.  We discussed constipation today.  Continue with your MiraLAX every day along with the Smooth Move Tea. You might try adding a Senokot stimulant laxative every third day as needed to give you a more complete BM. Please see your GI provider as planned January 05, 2020.

## 2019-12-02 LAB — URINE CULTURE
MICRO NUMBER:: 10770127
Result:: NO GROWTH
SPECIMEN QUALITY:: ADEQUATE

## 2019-12-04 ENCOUNTER — Encounter: Payer: Self-pay | Admitting: Nurse Practitioner

## 2019-12-05 ENCOUNTER — Encounter: Payer: Medicare Other | Admitting: Podiatry

## 2019-12-05 ENCOUNTER — Other Ambulatory Visit: Payer: Self-pay | Admitting: Internal Medicine

## 2019-12-15 DIAGNOSIS — K5080 Crohn's disease of both small and large intestine without complications: Secondary | ICD-10-CM | POA: Diagnosis not present

## 2019-12-23 ENCOUNTER — Other Ambulatory Visit: Payer: Self-pay | Admitting: Internal Medicine

## 2019-12-28 DIAGNOSIS — D2271 Melanocytic nevi of right lower limb, including hip: Secondary | ICD-10-CM | POA: Diagnosis not present

## 2019-12-28 DIAGNOSIS — L821 Other seborrheic keratosis: Secondary | ICD-10-CM | POA: Diagnosis not present

## 2019-12-28 DIAGNOSIS — D2272 Melanocytic nevi of left lower limb, including hip: Secondary | ICD-10-CM | POA: Diagnosis not present

## 2019-12-28 DIAGNOSIS — D2262 Melanocytic nevi of left upper limb, including shoulder: Secondary | ICD-10-CM | POA: Diagnosis not present

## 2019-12-28 DIAGNOSIS — D2261 Melanocytic nevi of right upper limb, including shoulder: Secondary | ICD-10-CM | POA: Diagnosis not present

## 2019-12-28 DIAGNOSIS — D225 Melanocytic nevi of trunk: Secondary | ICD-10-CM | POA: Diagnosis not present

## 2019-12-28 DIAGNOSIS — L720 Epidermal cyst: Secondary | ICD-10-CM | POA: Diagnosis not present

## 2019-12-28 DIAGNOSIS — D220 Melanocytic nevi of lip: Secondary | ICD-10-CM | POA: Diagnosis not present

## 2019-12-29 ENCOUNTER — Ambulatory Visit: Payer: Medicare Other | Admitting: Podiatry

## 2020-01-04 ENCOUNTER — Encounter: Payer: Self-pay | Admitting: Podiatry

## 2020-01-04 ENCOUNTER — Ambulatory Visit (INDEPENDENT_AMBULATORY_CARE_PROVIDER_SITE_OTHER): Payer: Medicare Other | Admitting: Podiatry

## 2020-01-04 ENCOUNTER — Other Ambulatory Visit: Payer: Self-pay

## 2020-01-04 DIAGNOSIS — M722 Plantar fascial fibromatosis: Secondary | ICD-10-CM

## 2020-01-04 DIAGNOSIS — M7731 Calcaneal spur, right foot: Secondary | ICD-10-CM | POA: Diagnosis not present

## 2020-01-09 ENCOUNTER — Encounter: Payer: Self-pay | Admitting: Podiatry

## 2020-01-09 NOTE — Progress Notes (Signed)
Subjective:  Patient ID: Kim Sanchez, female    DOB: 04-10-1941,  MRN: [id number]  Chief Complaint  Patient presents with  . Plantar Fasciitis    "im still having pain in my heel and now its in the back of my heeL"    79 y.o. female presents with the above complaint.  Patient presents with a right plantar fasciitis that was being treated by Dr. Amalia Hailey.  Patient states that his pain has improved a little bit however he still having pain.  Is painful in the morning.  Patient states that he is still continues to hurt.  She denies any other acute complaints.  She has been wearing supportive shoes and wearing stretching exercises.  She would like to do another steroid injection as the first 1 did help a little bit.  And she would like to discuss future treatment options as well.   Review of Systems: Negative except as noted in the HPI. Denies N/V/F/Ch.  Past Medical History:  Diagnosis Date  . Arthritis   . Breast cancer ([facility])   . Breast cancer, right breast ([facility]) 08/24/2012   Histologic grade 1, invasive mammary carcinoma, no specific type. 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  . Crohn's disease ([facility])   . GERD (gastroesophageal reflux disease)   . History of blood transfusion 1960  . Hypertension   . Personal history of radiation therapy     Current Outpatient Medications:  .  aspirin EC 81 MG tablet, Take 81 mg by mouth daily., Disp: , Rfl:  .  atorvastatin (LIPITOR) 10 MG tablet, Take by mouth every other day. , Disp: , Rfl: 11 .  bisoprolol (ZEBETA) 5 MG tablet, Take 1 tablet by mouth once daily, Disp: 30 tablet, Rfl: 0 .  calcium carbonate (TUMS - DOSED IN MG ELEMENTAL CALCIUM) 500 MG chewable tablet, Chew 1 tablet by mouth daily., Disp: , Rfl:  .  chlorthalidone (HYGROTON) 25 MG tablet, Take 1 tablet by mouth once daily, Disp: 30 tablet, Rfl: 0 .  furosemide (LASIX) 20 MG tablet, TAKE 1 TABLET BY MOUTH ONCE DAILY AS NEEDED FOR  FLUID RETENTION., Disp: 90 tablet, Rfl: 0 .  inFLIXimab (REMICADE) 100 MG injection, Inject 1 mg into the vein every 8 (eight) weeks., Disp: , Rfl:  .  LORazepam (ATIVAN) 0.5 MG tablet, TAKE 1 TABLET BY MOUTH EVERY 8 HOURS AS NEEDED FOR ANXIETY -  MAX  OF  2  TABLETS  PER  DAY, Disp: 60 tablet, Rfl: 0 .  Multiple Vitamins-Minerals (MULTIVITAMIN ADULT PO), Take by mouth., Disp: , Rfl:  .  nystatin (MYCOSTATIN) 100000 UNIT/ML suspension, SWISH & SWALLOW 5 ML BY MOUTH 4 TIMES DAILY, Disp: , Rfl: 1 .  Omega-3 Fatty Acids (FISH OIL PO), Take by mouth., Disp: , Rfl:  .  polyethylene glycol powder (GLYCOLAX/MIRALAX) powder, Take 17 g by mouth 2 (two) times daily as needed., Disp: 3350 g, Rfl: 11  Social History   Tobacco Use  Smoking Status Never Smoker  Smokeless Tobacco Never Used    Allergies  Allergen Reactions  . Chlorthalidone Shortness Of Breath  . Letrozole Anaphylaxis and Itching  . Methocarbamol Shortness Of Breath and Swelling  . Metoprolol Shortness Of Breath    Cough, chest discomfort  . Amlodipine Itching  . Exemestane Nausea Only  . Hydrochlorothiazide Other (See Comments)  . Lexapro [Escitalopram Oxalate] Other (See Comments)    intolerance  . Lisinopril Cough  . Losartan  Facial swelling,  Tongue swelling   . Sulfa Antibiotics Other (See Comments)   Objective:  There were no vitals filed for this visit. There is no height or weight on file to calculate BMI. Constitutional Well developed. Well nourished.  Vascular Dorsalis pedis pulses palpable bilaterally. Posterior tibial pulses palpable bilaterally. Capillary refill normal to all digits.  No cyanosis or clubbing noted. Pedal hair growth normal.  Neurologic Normal speech. Oriented to person, place, and time. Epicritic sensation to light touch grossly present bilaterally.  Dermatologic Nails well groomed and normal in appearance. No open wounds. No skin lesions.  Orthopedic: Normal joint ROM without pain  or crepitus bilaterally. No visible deformities. Tender to palpation at the calcaneal tuber right. No pain with calcaneal squeeze right. Ankle ROM diminished range of motion right. Silfverskiold Test: positive right.   Radiographs: Taken and reviewed. No acute fractures or dislocations. No evidence of stress fracture.  Plantar heel spur present. Posterior heel spur absent.   Assessment:   1. Plantar fasciitis, right   2. Heel spur, right    Plan:  Patient was evaluated and treated and all questions answered.  Plantar Fasciitis, right - XR reviewed as above.  - Educated on icing and stretching. Instructions given.  -Second injection delivered to the plantar fascia as below. - DME: Night splint - Pharmacologic management: Meloxicam/Medrol Dose Pak. Educated on risks/benefits and proper taking of medication.  Procedure: Injection Tendon/Ligament Location: Right plantar fascia at the glabrous junction; medial approach. Skin Prep: alcohol Injectate: 0.5 cc 0.5% marcaine plain, 0.5 cc of 1% Lidocaine, 0.5 cc kenalog 10. Disposition: Patient tolerated procedure well. Injection site dressed with a band-aid.  No follow-ups on file.

## 2020-01-19 ENCOUNTER — Other Ambulatory Visit: Payer: Self-pay

## 2020-01-19 ENCOUNTER — Ambulatory Visit (INDEPENDENT_AMBULATORY_CARE_PROVIDER_SITE_OTHER): Payer: Medicare Other | Admitting: Internal Medicine

## 2020-01-19 ENCOUNTER — Encounter: Payer: Self-pay | Admitting: Internal Medicine

## 2020-01-19 VITALS — BP 136/70 | HR 58 | Temp 98.9°F | Resp 16 | Ht 61.0 in | Wt 147.2 lb

## 2020-01-19 DIAGNOSIS — Z23 Encounter for immunization: Secondary | ICD-10-CM | POA: Diagnosis not present

## 2020-01-19 DIAGNOSIS — E1169 Type 2 diabetes mellitus with other specified complication: Secondary | ICD-10-CM

## 2020-01-19 DIAGNOSIS — E782 Mixed hyperlipidemia: Secondary | ICD-10-CM | POA: Diagnosis not present

## 2020-01-19 DIAGNOSIS — I10 Essential (primary) hypertension: Secondary | ICD-10-CM

## 2020-01-19 NOTE — Patient Instructions (Signed)
Your blood pressure is under excellent control on 1/2 tablet of bisoprolol and chlorthalidone  Continue both medications.  Suspend both if your top number drops to 100 or less.  Resume bisoprolol first ,  Once top number is over 130-140    You will be due for labs in December.  You can get them a day or two before your next visit

## 2020-01-19 NOTE — Progress Notes (Signed)
.   Subjective:  Patient ID: Kim Sanchez, female    DOB: Sep 27, 1940  Age: 79 y.o. MRN: [id number]  CC: The primary encounter diagnosis was Need for influenza vaccination. Diagnoses of DM type 2 with diabetic mixed hyperlipidemia ([facility]) and Essential hypertension were also pertinent to this visit.  HPI Kim Sanchez presents for diabetes, hypertension follow up   This visit occurred during the SARS-CoV-2 public health emergency.  Safety protocols were in place, including screening questions prior to the visit, additional usage of staff PPE, and extensive cleaning of exam room while observing appropriate contact time as indicated for disinfecting solutions.    Hypertension: patient checks blood pressure twice daily at home.  Readings have been for the most part < 130/80 at rest. Pulse sometimes drops to 49 or 51. Chronic compalint of tiredness with no recent worsening.   New onset foot cramps and calf cramps . Patient is following a reduced salt diet  and is taking medications as prescribed  DM:   T2DM:  She  feels generally well,  But is not  exercising regularly due to recent development of plantar fasciitis and ongoing concern for husband who requires constant supervision. She is . Checking  blood sugars once daily at variable times, more  if she feels she may be having a hypoglycemic event. .  BS have been under 130 fasting and < 150 post prandially.  Denies any recent hypoglyemic events.  Taking an asa and statin  Following a carbohydrate modified diet 7 days per week. Denies numbness, burning and tingling of extremities. Appetite is good. fastings 122 .    Saw [facility]:  Plantar fasciitis  In right foot only. Receiving shots . Too painful to exercise. Hurts the most after driving      Outpatient Medications Prior to Visit  Medication Sig Dispense Refill  . aspirin EC 81 MG tablet Take 81 mg by mouth daily.    Marland Kitchen atorvastatin (LIPITOR) 10 MG tablet Take by mouth every other day.   11   . bisoprolol (ZEBETA) 5 MG tablet Take 1 tablet by mouth once daily (Patient taking differently: Take 2.5 mg by mouth daily. ) 30 tablet 0  . calcium carbonate (TUMS - DOSED IN MG ELEMENTAL CALCIUM) 500 MG chewable tablet Chew 1 tablet by mouth daily.    . chlorthalidone (HYGROTON) 25 MG tablet Take 1 tablet by mouth once daily (Patient taking differently: Take 12.5 mg by mouth daily. ) 30 tablet 0  . furosemide (LASIX) 20 MG tablet TAKE 1 TABLET BY MOUTH ONCE DAILY AS NEEDED FOR FLUID RETENTION. 90 tablet 0  . inFLIXimab (REMICADE) 100 MG injection Inject 1 mg into the vein every 8 (eight) weeks.    Marland Kitchen LORazepam (ATIVAN) 0.5 MG tablet TAKE 1 TABLET BY MOUTH EVERY 8 HOURS AS NEEDED FOR ANXIETY -  MAX  OF  2  TABLETS  PER  DAY 60 tablet 0  . Multiple Vitamins-Minerals (MULTIVITAMIN ADULT PO) Take by mouth.    . nystatin (MYCOSTATIN) 100000 UNIT/ML suspension SWISH & SWALLOW 5 ML BY MOUTH 4 TIMES DAILY  1  . Omega-3 Fatty Acids (FISH OIL PO) Take by mouth.    . polyethylene glycol powder (GLYCOLAX/MIRALAX) powder Take 17 g by mouth 2 (two) times daily as needed. 3350 g 11   No facility-administered medications prior to visit.    Review of Systems;  Patient denies headache, fevers, malaise, unintentional weight loss, skin rash, eye pain, sinus congestion and sinus pain,  sore throat, dysphagia,  hemoptysis , cough, dyspnea, wheezing, chest pain, palpitations, orthopnea, edema, abdominal pain, nausea, melena, diarrhea, constipation, flank pain, dysuria, hematuria, urinary  Frequency, nocturia, numbness, tingling, seizures,  Focal weakness, Loss of consciousness,  Tremor, insomnia, depression, anxiety, and suicidal ideation.      Objective:  BP 136/70 (BP Location: Left Arm, Patient Position: Sitting, Cuff Size: Normal)   Pulse (!) 58   Temp 98.9 F (37.2 C) (Oral)   Resp 16   Ht 5' 1"  (1.549 m)   Wt 147 lb 3.2 oz (66.8 kg)   SpO2 99%   BMI 27.81 kg/m   BP Readings from Last 3 Encounters:    01/19/20 136/70  12/01/19 (!) 132/76  10/19/19 (!) 162/90    Wt Readings from Last 3 Encounters:  01/19/20 147 lb 3.2 oz (66.8 kg)  12/01/19 146 lb (66.2 kg)  10/19/19 147 lb 9.6 oz (67 kg)    General appearance: alert, cooperative and appears stated age Ears: normal TM's and external ear canals both ears Throat: lips, mucosa, and tongue normal; teeth and gums normal Neck: no adenopathy, no carotid bruit, supple, symmetrical, trachea midline and thyroid not enlarged, symmetric, no tenderness/mass/nodules Back: symmetric, no curvature. ROM normal. No CVA tenderness. Lungs: clear to auscultation bilaterally Heart: regular rate and rhythm, S1, S2 normal, no murmur, click, rub or gallop Abdomen: soft, non-tender; bowel sounds normal; no masses,  no organomegaly Pulses: 2+ and symmetric Skin: Skin color, texture, turgor normal. No rashes or lesions Lymph nodes: Cervical, supraclavicular, and axillary nodes normal.  Lab Results  Component Value Date   HGBA1C 7.2 (H) 10/17/2019   HGBA1C 6.6 (H) 04/11/2019   HGBA1C 6.5 06/30/2018    Lab Results  Component Value Date   CREATININE 0.67 10/17/2019   CREATININE 0.70 04/11/2019   CREATININE 0.74 06/30/2018    Lab Results  Component Value Date   WBC 5.8 12/31/2016   HGB 13.5 12/31/2016   HCT 39.7 12/31/2016   PLT 215 12/31/2016   GLUCOSE 125 (H) 10/17/2019   CHOL 193 10/17/2019   TRIG 82.0 10/17/2019   HDL 69.00 10/17/2019   LDLDIRECT 151.5 10/03/2013   LDLCALC 107 (H) 10/17/2019   ALT 13 10/17/2019   AST 17 10/17/2019   NA 138 10/17/2019   K 3.9 10/17/2019   CL 105 10/17/2019   CREATININE 0.67 10/17/2019   BUN 14 10/17/2019   CO2 27 10/17/2019   TSH 2.39 09/30/2016   HGBA1C 7.2 (H) 10/17/2019   MICROALBUR 0.8 10/17/2019    MM 3D SCREEN BREAST BILATERAL  Result Date: 12/04/2019 CLINICAL DATA:  Screening. EXAM: DIGITAL SCREENING BILATERAL MAMMOGRAM WITH TOMO AND CAD COMPARISON:  Previous exam(s). ACR Breast Density  Category c: The breast tissue is heterogeneously dense, which may obscure small masses. FINDINGS: There are no findings suspicious for malignancy. Images were processed with CAD. IMPRESSION: No mammographic evidence of malignancy. A result letter of this screening mammogram will be mailed directly to the patient. RECOMMENDATION: Screening mammogram in one year. (Code:SM-B-01Y) BI-RADS CATEGORY  1: Negative. Electronically Signed   By: Dorise Bullion III M.D   On: 12/04/2019 15:53    Assessment & Plan:   Problem List Items Addressed This Visit      Unprioritized   Essential hypertension    Well controlled and finally tolerating a regimen without  Perceived side effects.  Continue chlorthalidone and bisoprolol  Lab Results  Component Value Date   NA 138 10/17/2019   K 3.9 10/17/2019  CL 105 10/17/2019   CO2 27 10/17/2019   Lab Results  Component Value Date   CREATININE 0.67 10/17/2019         DM type 2 with diabetic mixed hyperlipidemia ([facility])    Her diabetes remains well-controlled on diet alone. She is intolerant of ACE I and ARBs due to cough and  Constipation, respectively .  She has not  proteinuria despite  Recurrent reports of "foamy urine.".  Foot exam is normal  .  Taking aspirin and  atorvastatin every other day  Due to muscle pain with daily use,  And use of aspirin reduced to once weekly due to nosebleeds .    Risk of CAD is 25%   Lab Results  Component Value Date   HGBA1C 7.2 (H) 10/17/2019    Lab Results  Component Value Date   CHOL 193 10/17/2019   HDL 69.00 10/17/2019   LDLCALC 107 (H) 10/17/2019   LDLDIRECT 151.5 10/03/2013   TRIG 82.0 10/17/2019   CHOLHDL 3 10/17/2019         Relevant Orders   Hemoglobin A1c   Comprehensive metabolic panel   Lipid panel    Other Visit Diagnoses    Need for influenza vaccination    -  Primary   Relevant Orders   Flu Vaccine QUAD High Dose(Fluad) (Completed)      I am having Oliviah I. Jerome "AGGIE" maintain her  inFLIXimab, aspirin EC, Omega-3 Fatty Acids (FISH OIL PO), calcium carbonate, atorvastatin, nystatin, polyethylene glycol powder, LORazepam, Multiple Vitamins-Minerals (MULTIVITAMIN ADULT PO), furosemide, chlorthalidone, and bisoprolol.  No orders of the defined types were placed in this encounter.   There are no discontinued medications.  Follow-up: No follow-ups on file.   [name], MD

## 2020-01-21 NOTE — Assessment & Plan Note (Signed)
Her diabetes remains well-controlled on diet alone. She is intolerant of ACE I and ARBs due to cough and  Constipation, respectively .  She has not  proteinuria despite  Recurrent reports of "foamy urine.".  Foot exam is normal  .  Taking aspirin and  atorvastatin every other day  Due to muscle pain with daily use,  And use of aspirin reduced to once weekly due to nosebleeds .    Risk of CAD is 25%   Lab Results  Component Value Date   HGBA1C 7.2 (H) 10/17/2019    Lab Results  Component Value Date   CHOL 193 10/17/2019   HDL 69.00 10/17/2019   LDLCALC 107 (H) 10/17/2019   LDLDIRECT 151.5 10/03/2013   TRIG 82.0 10/17/2019   CHOLHDL 3 10/17/2019

## 2020-01-21 NOTE — Assessment & Plan Note (Addendum)
Well controlled and finally tolerating a regimen without  Perceived side effects.  Continue chlorthalidone and bisoprolol  Lab Results  Component Value Date   NA 138 10/17/2019   K 3.9 10/17/2019   CL 105 10/17/2019   CO2 27 10/17/2019   Lab Results  Component Value Date   CREATININE 0.67 10/17/2019

## 2020-02-02 ENCOUNTER — Other Ambulatory Visit: Payer: Self-pay

## 2020-02-02 ENCOUNTER — Encounter: Payer: Self-pay | Admitting: Podiatry

## 2020-02-02 ENCOUNTER — Ambulatory Visit (INDEPENDENT_AMBULATORY_CARE_PROVIDER_SITE_OTHER): Payer: Medicare Other | Admitting: Podiatry

## 2020-02-02 ENCOUNTER — Other Ambulatory Visit: Payer: Self-pay | Admitting: Internal Medicine

## 2020-02-02 DIAGNOSIS — M722 Plantar fascial fibromatosis: Secondary | ICD-10-CM

## 2020-02-02 NOTE — Progress Notes (Signed)
   Subjective: 78 y.o. female for follow-up evaluation of right heel pain.  Patient states that it is improved and is better.  She has received 2 injections now.  She is also been wearing the plantar fascial brace in the evenings.  She was given a night splint but it did not help alleviate her symptoms.  She presents for further treatment and evaluation   Past Medical History:  Diagnosis Date  . Arthritis   . Breast cancer ([facility])   . Breast cancer, right breast ([facility]) 08/24/2012   Histologic grade 1, invasive mammary carcinoma, no specific type. 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  . Crohn's disease ([facility])   . GERD (gastroesophageal reflux disease)   . History of blood transfusion 1960  . Hypertension   . Personal history of radiation therapy      Objective: Physical Exam General: The patient is alert and oriented x3 in no acute distress.  Dermatology: Skin is warm, dry and supple bilateral lower extremities. Negative for open lesions or macerations bilateral.   Vascular: Dorsalis Pedis and Posterior Tibial pulses palpable bilateral.  Capillary fill time is immediate to all digits.  Neurological: Epicritic and protective threshold intact bilateral.   Musculoskeletal: Tenderness to palpation to the plantar aspect of the right heel along the plantar fascia. All other joints range of motion within normal limits bilateral. Strength 5/5 in all groups bilateral.   Assessment: 1. Plantar fasciitis right  Plan of Care:  1. Patient evaluated.  2.  Patient declined injections today 3.  Patient declined any oral medication today.  Continue OTC ibuprofen as needed 4.  Continue plantar fascial brace 5.  OTC power step insoles provided today. 6.  Recommend good supportive sneakers 7.  Return to clinic as needed    M. , DPM [facility]  Dr.  M. , DPM    [address]                                         [place], [place] [address]                Office [phone]  Fax [phone]     

## 2020-02-09 DIAGNOSIS — K5000 Crohn's disease of small intestine without complications: Secondary | ICD-10-CM | POA: Diagnosis not present

## 2020-02-09 DIAGNOSIS — K5080 Crohn's disease of both small and large intestine without complications: Secondary | ICD-10-CM | POA: Diagnosis not present

## 2020-02-09 DIAGNOSIS — K5900 Constipation, unspecified: Secondary | ICD-10-CM | POA: Diagnosis not present

## 2020-02-14 MED ORDER — LORAZEPAM 0.5 MG PO TABS
ORAL_TABLET | ORAL | 5 refills | Status: DC
Start: 2020-02-14 — End: 2021-04-07

## 2020-03-03 DIAGNOSIS — J069 Acute upper respiratory infection, unspecified: Secondary | ICD-10-CM | POA: Diagnosis not present

## 2020-03-10 NOTE — Progress Notes (Signed)
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  INTERVAL HISTORY: Patient returns to clinic today for routine yearly evaluation.  She continues to feel well and remains asymptomatic. She has no neurologic complaints.  She denies any fevers or recent illnesses.  She has a good appetite and denies weight loss.  She denies any chest pain, shortness of breath, cough, or hemoptysis.  She has no nausea, vomiting, constipation, or diarrhea.  She has no urinary complaints.  Patient offers no specific complaints today.  REVIEW OF SYSTEMS:   Review of Systems  Constitutional: Negative.  Negative for fever, malaise/fatigue and weight loss.  Respiratory: Negative.  Negative for cough and shortness of breath.   Cardiovascular: Negative.  Negative for chest pain and leg swelling.  Gastrointestinal: Negative.  Negative for abdominal pain.  Genitourinary: Negative.  Negative for dysuria.  Musculoskeletal: Negative.  Negative for back pain.  Skin: Negative.  Negative for rash.  Neurological: Negative.  Negative for sensory change, focal weakness and weakness.  Psychiatric/Behavioral: Negative.  The patient is not nervous/anxious and does not have insomnia.    As per HPI. Otherwise, a complete review of systems is negative.  PAST MEDICAL HISTORY: Past Medical History:  Diagnosis Date  . Arthritis   . Breast cancer ([place])   . Breast cancer, right breast ([place]) 08/24/2012   Histologic grade 1, invasive mammary carcinoma, no specific type. 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  . Crohn's  disease ([place])   . GERD (gastroesophageal reflux disease)   . History of blood transfusion 1960  . Hypertension   . Personal history of radiation therapy     PAST SURGICAL HISTORY: Past Surgical History:  Procedure Laterality Date  . BRAIN SURGERY  1980   breast biopsies, benign  . 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 BREAST BIOPSY Bilateral    - core Bx- neg  . BREAST LUMPECTOMY Right May 2014   T1c, N0; ER/PR positive, HER-2/neu not overexpressing.  Marland Kitchen BREAST SURGERY  1980's   fibro cyst both breast in [place]  . COLONOSCOPY  2017  . DILATION AND CURETTAGE OF UTERUS      FAMILY HISTORY Family History  Problem Relation Age of Onset  . Cancer Paternal Grandmother   . Heart disease Sister   . Diabetes Sister   . Breast cancer Neg Hx        ADVANCED DIRECTIVES:    HEALTH MAINTENANCE: Social History   Tobacco Use  . Smoking status: Never Smoker  . Smokeless tobacco: Never Used  Vaping Use  . Vaping Use: Never used  Substance Use Topics  . Alcohol use: Yes    Alcohol/week: 1.0 standard drink    Types: 1 Glasses of wine per week    Comment: OCC  . Drug use: No     Colonoscopy:  PAP:  Bone density:  Lipid panel:  Allergies  Allergen Reactions  . Chlorthalidone Shortness Of Breath  . Letrozole Anaphylaxis and Itching  . Methocarbamol Shortness Of Breath and Swelling  . Metoprolol Shortness Of Breath    Cough, chest discomfort  . Amlodipine Itching  . Exemestane  Nausea Only  . Hydrochlorothiazide Other (See Comments)  . Lexapro [Escitalopram Oxalate] Other (See Comments)    intolerance  . Lisinopril Cough  . Losartan     Facial swelling,  Tongue swelling   . Sulfa Antibiotics Other (See Comments)    Current Outpatient Medications  Medication Sig Dispense Refill  . aspirin EC 81 MG tablet Take 81 mg by mouth daily.    Marland Kitchen atorvastatin (LIPITOR) 10 MG tablet Take by mouth every other day.   11  . bisoprolol (ZEBETA) 5 MG tablet  Take 1 tablet by mouth once daily 30 tablet 0  . calcium carbonate (TUMS - DOSED IN MG ELEMENTAL CALCIUM) 500 MG chewable tablet Chew 1 tablet by mouth daily.    . chlorthalidone (HYGROTON) 25 MG tablet Take 1 tablet by mouth once daily (Patient taking differently: Take 12.5 mg by mouth daily. ) 30 tablet 0  . furosemide (LASIX) 20 MG tablet TAKE 1 TABLET BY MOUTH ONCE DAILY AS NEEDED FOR FLUID RETENTION. 90 tablet 0  . inFLIXimab (REMICADE) 100 MG injection Inject 1 mg into the vein every 8 (eight) weeks.    Marland Kitchen LORazepam (ATIVAN) 0.5 MG tablet TAKE 1 TABLET BY MOUTH EVERY 8 HOURS AS NEEDED FOR ANXIETY -  MAX  OF  2  TABLETS  PER  DAY 60 tablet 5  . Multiple Vitamins-Minerals (MULTIVITAMIN ADULT PO) Take by mouth.    . Omega-3 Fatty Acids (FISH OIL PO) Take by mouth.    . polyethylene glycol powder (GLYCOLAX/MIRALAX) powder Take 17 g by mouth 2 (two) times daily as needed. 3350 g 11  . nystatin (MYCOSTATIN) 100000 UNIT/ML suspension SWISH & SWALLOW 5 ML BY MOUTH 4 TIMES DAILY (Patient not taking: Reported on 03/14/2020)  1   No current facility-administered medications for this visit.    OBJECTIVE: Vitals:   03/14/20 1432  BP: 128/90  Pulse: 72  Resp: 18  Temp: (!) 97 F (36.1 C)     Body mass index is 28.72 kg/m.    ECOG FS:0 - Asymptomatic  General: Well-developed, well-nourished, no acute distress. Eyes: Pink conjunctiva, anicteric sclera. HEENT: Normocephalic, moist mucous membranes. Breast: Bilateral breast and axilla without lumps or masses. Lungs: No audible wheezing or coughing. Heart: Regular rate and rhythm. Abdomen: Soft, nontender, no obvious distention. Musculoskeletal: No edema, cyanosis, or clubbing. Neuro: Alert, answering all questions appropriately. Cranial nerves grossly intact. Skin: No rashes or petechiae noted. Psych: Normal affect.   LAB RESULTS:  Lab Results  Component Value Date   NA 138 10/17/2019   K 3.9 10/17/2019   CL 105 10/17/2019   CO2 27  10/17/2019   GLUCOSE 125 (H) 10/17/2019   BUN 14 10/17/2019   CREATININE 0.67 10/17/2019   CALCIUM 9.1 10/17/2019   PROT 7.1 10/17/2019   ALBUMIN 3.9 10/17/2019   AST 17 10/17/2019   ALT 13 10/17/2019   ALKPHOS 58 10/17/2019   BILITOT 0.9 10/17/2019   GFRNONAA >60 12/31/2016   GFRAA >60 12/31/2016    Lab Results  Component Value Date   WBC 5.8 12/31/2016   NEUTROABS 1.9 09/30/2016   HGB 13.5 12/31/2016   HCT 39.7 12/31/2016   MCV 91.1 12/31/2016   PLT 215 12/31/2016     STUDIES: No results found.  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 No Oncotype score available.  PLAN:    1.  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  Patient completed 5 years of anastrozole in September 2019.  Patient's most recent mammogram on November 30, 2019 was reported as BI-RADS 2.  Repeat in 1 year.  After lengthy discussion with the patient, it was agreed upon that no further follow-up is necessary.  Continue to follow closely with primary care who also can now order her yearly mammograms.  Please refer patient back if there are any questions or concerns. 2. Osteopenia: Patient's most recent bone mineral density on March 15, 2018 reported T score of -1.6.  This is essentially unchanged from 1 and 2 years prior.  No intervention is needed at this time.  Continue calcium and vitamin D supplementation.  Continue monitoring per primary care.     3. Arthritis: Chronic and unchanged.  Continue evaluation and treatment per rheumatology.    I spent a total of 20 minutes reviewing chart data, face-to-face evaluation with the patient, counseling and coordination of care as detailed above.   Patient expressed understanding and was in agreement with this plan. She also understands that She can call  clinic at any time with any questions, concerns, or complaints.    Lloyd Huger, MD   03/15/2020 10:31 AM

## 2020-03-14 ENCOUNTER — Inpatient Hospital Stay: Payer: Medicare Other | Attending: Oncology | Admitting: Oncology

## 2020-03-14 ENCOUNTER — Other Ambulatory Visit: Payer: Self-pay

## 2020-03-14 ENCOUNTER — Encounter: Payer: Self-pay | Admitting: Oncology

## 2020-03-14 VITALS — BP 128/90 | HR 72 | Temp 97.0°F | Resp 18 | Wt 152.0 lb

## 2020-03-14 DIAGNOSIS — Z833 Family history of diabetes mellitus: Secondary | ICD-10-CM | POA: Diagnosis not present

## 2020-03-14 DIAGNOSIS — M1990 Unspecified osteoarthritis, unspecified site: Secondary | ICD-10-CM | POA: Insufficient documentation

## 2020-03-14 DIAGNOSIS — M8580 Other specified disorders of bone density and structure, unspecified site: Secondary | ICD-10-CM | POA: Diagnosis not present

## 2020-03-14 DIAGNOSIS — Z853 Personal history of malignant neoplasm of breast: Secondary | ICD-10-CM | POA: Insufficient documentation

## 2020-03-14 DIAGNOSIS — K5090 Crohn's disease, unspecified, without complications: Secondary | ICD-10-CM | POA: Insufficient documentation

## 2020-03-14 DIAGNOSIS — I10 Essential (primary) hypertension: Secondary | ICD-10-CM | POA: Diagnosis not present

## 2020-03-14 DIAGNOSIS — Z7982 Long term (current) use of aspirin: Secondary | ICD-10-CM | POA: Insufficient documentation

## 2020-03-14 DIAGNOSIS — Z923 Personal history of irradiation: Secondary | ICD-10-CM | POA: Insufficient documentation

## 2020-03-14 DIAGNOSIS — C50911 Malignant neoplasm of unspecified site of right female breast: Secondary | ICD-10-CM

## 2020-03-14 DIAGNOSIS — Z809 Family history of malignant neoplasm, unspecified: Secondary | ICD-10-CM | POA: Insufficient documentation

## 2020-03-14 DIAGNOSIS — Z8249 Family history of ischemic heart disease and other diseases of the circulatory system: Secondary | ICD-10-CM | POA: Diagnosis not present

## 2020-03-14 DIAGNOSIS — Z170 Estrogen receptor positive status [ER+]: Secondary | ICD-10-CM | POA: Insufficient documentation

## 2020-03-14 DIAGNOSIS — Z79899 Other long term (current) drug therapy: Secondary | ICD-10-CM | POA: Insufficient documentation

## 2020-03-14 DIAGNOSIS — K219 Gastro-esophageal reflux disease without esophagitis: Secondary | ICD-10-CM | POA: Diagnosis not present

## 2020-03-14 NOTE — Progress Notes (Signed)
Pt in for 1 year follow up.  Denies any concerns today.

## 2020-04-11 DIAGNOSIS — K5080 Crohn's disease of both small and large intestine without complications: Secondary | ICD-10-CM | POA: Diagnosis not present

## 2020-04-17 ENCOUNTER — Other Ambulatory Visit (INDEPENDENT_AMBULATORY_CARE_PROVIDER_SITE_OTHER): Payer: Medicare Other

## 2020-04-17 ENCOUNTER — Other Ambulatory Visit: Payer: Self-pay

## 2020-04-17 DIAGNOSIS — E1169 Type 2 diabetes mellitus with other specified complication: Secondary | ICD-10-CM

## 2020-04-17 DIAGNOSIS — E782 Mixed hyperlipidemia: Secondary | ICD-10-CM

## 2020-04-17 LAB — LIPID PANEL
Cholesterol: 257 mg/dL — ABNORMAL HIGH (ref 0–200)
HDL: 66.2 mg/dL (ref >39.00)
LDL Cholesterol: 166 mg/dL — ABNORMAL HIGH (ref 0–99)
NonHDL: 191.11
Total CHOL/HDL Ratio: 4
Triglycerides: 126 mg/dL (ref 0.0–149.0)
VLDL: 25.2 mg/dL (ref 0.0–40.0)

## 2020-04-17 LAB — COMPREHENSIVE METABOLIC PANEL
ALT: 16 U/L (ref 0–35)
AST: 18 U/L (ref 0–37)
Albumin: 4 g/dL (ref 3.5–5.2)
Alkaline Phosphatase: 64 U/L (ref 39–117)
BUN: 20 mg/dL (ref 6–23)
CO2: 27 mEq/L (ref 19–32)
Calcium: 9.3 mg/dL (ref 8.4–10.5)
Chloride: 103 mEq/L (ref 96–112)
Creatinine, Ser: 0.74 mg/dL (ref 0.40–1.20)
GFR: 77.18 mL/min (ref >60.00)
Glucose, Bld: 129 mg/dL — ABNORMAL HIGH (ref 70–99)
Potassium: 3.7 mEq/L (ref 3.5–5.1)
Sodium: 139 mEq/L (ref 135–145)
Total Bilirubin: 0.9 mg/dL (ref 0.2–1.2)
Total Protein: 7.4 g/dL (ref 6.0–8.3)

## 2020-04-17 LAB — HEMOGLOBIN A1C: Hgb A1c MFr Bld: 6.5 % (ref 4.6–6.5)

## 2020-04-18 ENCOUNTER — Encounter: Payer: Self-pay | Admitting: Internal Medicine

## 2020-04-18 ENCOUNTER — Other Ambulatory Visit: Payer: Self-pay

## 2020-04-18 ENCOUNTER — Ambulatory Visit (INDEPENDENT_AMBULATORY_CARE_PROVIDER_SITE_OTHER): Payer: Medicare Other | Admitting: Internal Medicine

## 2020-04-18 VITALS — BP 120/74 | HR 72 | Temp 98.4°F | Resp 14 | Ht 61.0 in | Wt 152.8 lb

## 2020-04-18 DIAGNOSIS — E1169 Type 2 diabetes mellitus with other specified complication: Secondary | ICD-10-CM | POA: Diagnosis not present

## 2020-04-18 DIAGNOSIS — E782 Mixed hyperlipidemia: Secondary | ICD-10-CM

## 2020-04-18 DIAGNOSIS — T466X5A Adverse effect of antihyperlipidemic and antiarteriosclerotic drugs, initial encounter: Secondary | ICD-10-CM

## 2020-04-18 DIAGNOSIS — I10 Essential (primary) hypertension: Secondary | ICD-10-CM

## 2020-04-18 DIAGNOSIS — M791 Myalgia, unspecified site: Secondary | ICD-10-CM | POA: Diagnosis not present

## 2020-04-18 DIAGNOSIS — Z853 Personal history of malignant neoplasm of breast: Secondary | ICD-10-CM | POA: Diagnosis not present

## 2020-04-18 NOTE — Assessment & Plan Note (Signed)
She has been released by Oncology due to nonrecurrence since completing treatment.  Annual mammogram is due in July

## 2020-04-18 NOTE — Progress Notes (Signed)
Subjective:  Patient ID: Kim Sanchez, female    DOB: 1940/10/03  Age: 79 y.o. MRN: [id number]  CC: The primary encounter diagnosis was Essential hypertension. Diagnoses of History of right breast cancer, DM type 2 with diabetic mixed hyperlipidemia ([place]), Mixed hyperlipidemia, and Myalgia due to statin were also pertinent to this visit.  HPI TARINA VOLK presents for 3 month follow up on T2DM, hypertension and hyperlipidemia and GAD   This visit occurred during the SARS-CoV-2 public health emergency.  Safety protocols were in place, including screening questions prior to the visit, additional usage of staff PPE, and extensive cleaning of exam room while observing appropriate contact time as indicated for disinfecting solutions.    T2DM:  She  feels generally well,  But is not  exercising regularly due to recent plantar fasciitis episode and is dismayed that she has gained weight. Checking  blood sugars less than once daily at variable times, usually only if she feels she may be having a hypoglycemic event. .  BS have been under 130 fasting and < 150 post prandially.  Denies any recent hypoglyemic events.  Taking   medications as directed. Following a carbohydrate modified diet 6 days per week. Denies numbness, burning and tingling of extremities. Appetite is good. Her foot pain has finally resolved and she plan to rejoin [employer]   Outpatient Medications Prior to Visit  Medication Sig Dispense Refill  . aspirin EC 81 MG tablet Take 81 mg by mouth daily.    Marland Kitchen atorvastatin (LIPITOR) 10 MG tablet Take by mouth every other day.   11  . bisoprolol (ZEBETA) 5 MG tablet Take 1 tablet by mouth once daily (Patient taking differently: Take 2.5 mg by mouth daily.) 30 tablet 0  . calcium carbonate (TUMS - DOSED IN MG ELEMENTAL CALCIUM) 500 MG chewable tablet Chew 1 tablet by mouth daily.    . chlorthalidone (HYGROTON) 25 MG tablet Take 1 tablet by mouth once daily (Patient taking differently: Take  12.5 mg by mouth daily.) 30 tablet 0  . furosemide (LASIX) 20 MG tablet TAKE 1 TABLET BY MOUTH ONCE DAILY AS NEEDED FOR FLUID RETENTION. 90 tablet 0  . inFLIXimab (REMICADE) 100 MG injection Inject 1 mg into the vein every 8 (eight) weeks.    Marland Kitchen LORazepam (ATIVAN) 0.5 MG tablet TAKE 1 TABLET BY MOUTH EVERY 8 HOURS AS NEEDED FOR ANXIETY -  MAX  OF  2  TABLETS  PER  DAY 60 tablet 5  . Multiple Vitamins-Minerals (MULTIVITAMIN ADULT PO) Take by mouth.    . nystatin (MYCOSTATIN) 100000 UNIT/ML suspension SWISH & SWALLOW 5 ML BY MOUTH 4 TIMES DAILY  1  . Omega-3 Fatty Acids (FISH OIL PO) Take by mouth.    . polyethylene glycol powder (GLYCOLAX/MIRALAX) powder Take 17 g by mouth 2 (two) times daily as needed. 3350 g 11   No facility-administered medications prior to visit.    Review of Systems;  Patient denies headache, fevers, malaise, unintentional weight loss, skin rash, eye pain, sinus congestion and sinus pain, sore throat, dysphagia,  hemoptysis , cough, dyspnea, wheezing, chest pain, palpitations, orthopnea, edema, abdominal pain, nausea, melena, diarrhea, constipation, flank pain, dysuria, hematuria, urinary  Frequency, nocturia, numbness, tingling, seizures,  Focal weakness, Loss of consciousness,  Tremor, insomnia, depression, anxiety, and suicidal ideation.      Objective:  BP 120/74 (BP Location: Left Arm, Patient Position: Sitting, Cuff Size: Normal)   Pulse 72   Temp 98.4 F (36.9 C) (Oral)  Resp 14   Ht 5' 1"  (1.549 m)   Wt 152 lb 12.8 oz (69.3 kg)   SpO2 97%   BMI 28.87 kg/m   BP Readings from Last 3 Encounters:  04/18/20 120/74  03/14/20 128/90  01/19/20 136/70    Wt Readings from Last 3 Encounters:  04/18/20 152 lb 12.8 oz (69.3 kg)  03/14/20 152 lb (68.9 kg)  01/19/20 147 lb 3.2 oz (66.8 kg)    General appearance: alert, cooperative and appears stated age Ears: normal TM's and external ear canals both ears Throat: lips, mucosa, and tongue normal; teeth and gums  normal Neck: no adenopathy, no carotid bruit, supple, symmetrical, trachea midline and thyroid not enlarged, symmetric, no tenderness/mass/nodules Back: symmetric, no curvature. ROM normal. No CVA tenderness. Lungs: clear to auscultation bilaterally Heart: regular rate and rhythm, S1, S2 normal, no murmur, click, rub or gallop Abdomen: soft, non-tender; bowel sounds normal; no masses,  no organomegaly Pulses: 2+ and symmetric Skin: Skin color, texture, turgor normal. No rashes or lesions Lymph nodes: Cervical, supraclavicular, and axillary nodes normal.  Lab Results  Component Value Date   HGBA1C 6.5 04/17/2020   HGBA1C 7.2 (H) 10/17/2019   HGBA1C 6.6 (H) 04/11/2019    Lab Results  Component Value Date   CREATININE 0.74 04/17/2020   CREATININE 0.67 10/17/2019   CREATININE 0.70 04/11/2019    Lab Results  Component Value Date   WBC 5.8 12/31/2016   HGB 13.5 12/31/2016   HCT 39.7 12/31/2016   PLT 215 12/31/2016   GLUCOSE 129 (H) 04/17/2020   CHOL 257 (H) 04/17/2020   TRIG 126.0 04/17/2020   HDL 66.20 04/17/2020   LDLDIRECT 151.5 10/03/2013   LDLCALC 166 (H) 04/17/2020   ALT 16 04/17/2020   AST 18 04/17/2020   NA 139 04/17/2020   K 3.7 04/17/2020   CL 103 04/17/2020   CREATININE 0.74 04/17/2020   BUN 20 04/17/2020   CO2 27 04/17/2020   TSH 2.39 09/30/2016   HGBA1C 6.5 04/17/2020   MICROALBUR 0.8 10/17/2019    MM 3D SCREEN BREAST BILATERAL  Result Date: 12/04/2019 CLINICAL DATA:  Screening. EXAM: DIGITAL SCREENING BILATERAL MAMMOGRAM WITH TOMO AND CAD COMPARISON:  Previous exam(s). ACR Breast Density Category c: The breast tissue is heterogeneously dense, which may obscure small masses. FINDINGS: There are no findings suspicious for malignancy. Images were processed with CAD. IMPRESSION: No mammographic evidence of malignancy. A result letter of this screening mammogram will be mailed directly to the patient. RECOMMENDATION: Screening mammogram in one year.  (Code:SM-B-01Y) BI-RADS CATEGORY  1: Negative. Electronically Signed   By: Dorise Bullion III M.D   On: 12/04/2019 15:53    Assessment & Plan:   Problem List Items Addressed This Visit      Unprioritized   HLD (hyperlipidemia)    LDL is above goal since reducing atorvastatin 10 mg to twice weekly due to myalgias. .  Recommend increase dose to 20 mg three times weekly       Relevant Orders   Lipid panel   Essential hypertension - Primary    Well controlled and finally tolerating a regimen without side effects.  Continue chlorthalidone and bisoprolol  Lab Results  Component Value Date   NA 139 04/17/2020   K 3.7 04/17/2020   CL 103 04/17/2020   CO2 27 04/17/2020   Lab Results  Component Value Date   CREATININE 0.74 04/17/2020         DM type 2 with diabetic mixed hyperlipidemia ([place])  Her diabetes remains well-controlled on diet alone. She is intolerant of ACE I and ARBs due to cough and  Constipation, respectively .  She has not  proteinuria despite  Recurrent reports of "foamy urine.".  Foot exam is normal  .  Taking aspirin and  atorvastatin every 2 days  Due to muscle pain with daily use,  And use of aspirin reduced to once weekly due to nosebleeds .    Risk of CAD is 25%   Lab Results  Component Value Date   HGBA1C 6.5 04/17/2020   Lab Results  Component Value Date   MICROALBUR 0.8 10/17/2019   MICROALBUR 1.4 04/11/2019           Relevant Orders   Comprehensive metabolic panel   Hemoglobin A1c   Microalbumin / creatinine urine ratio   History of right breast cancer    She has been released by Oncology due to nonrecurrence since completing treatment.  Annual mammogram is due in July       Myalgia due to statin    She has reduced atorvastatin to twice weekly but is willing to increase dose and frequency as a trial to 20 mg three times per week         I provided  30 minutes of  face-to-face time during this encounter reviewing patient's current  problems and past surgeries, labs and imaging studies, providing counseling on the above mentioned problems , and coordination  of care .  I am having Herbert Pun [place]. [place] "AGGIE" maintain her inFLIXimab, aspirin EC, Omega-3 Fatty Acids (FISH OIL PO), calcium carbonate, atorvastatin, nystatin, polyethylene glycol powder, Multiple Vitamins-Minerals (MULTIVITAMIN ADULT PO), furosemide, chlorthalidone, bisoprolol, and LORazepam.  No orders of the defined types were placed in this encounter.   There are no discontinued medications.  Follow-up: Return in about 6 months (around 10/17/2020) for follow up diabetes.   [name], MD

## 2020-04-18 NOTE — Assessment & Plan Note (Signed)
She has reduced atorvastatin to twice weekly but is willing to increase dose and frequency as a trial to 20 mg three times per week

## 2020-04-18 NOTE — Assessment & Plan Note (Addendum)
Her diabetes remains well-controlled on diet alone. She is intolerant of ACE I and ARBs due to cough and  Constipation, respectively .  She has not  proteinuria despite  Recurrent reports of "foamy urine.".  Foot exam is normal  .  Taking aspirin and  atorvastatin every 2 days  Due to muscle pain with daily use,  And use of aspirin reduced to once weekly due to nosebleeds .    Risk of CAD is 25%   Lab Results  Component Value Date   HGBA1C 6.5 04/17/2020   Lab Results  Component Value Date   MICROALBUR 0.8 10/17/2019   MICROALBUR 1.4 04/11/2019

## 2020-04-18 NOTE — Assessment & Plan Note (Signed)
Well controlled and finally tolerating a regimen without side effects.  Continue chlorthalidone and bisoprolol  Lab Results  Component Value Date   NA 139 04/17/2020   K 3.7 04/17/2020   CL 103 04/17/2020   CO2 27 04/17/2020   Lab Results  Component Value Date   CREATININE 0.74 04/17/2020

## 2020-04-18 NOTE — Assessment & Plan Note (Signed)
LDL is above goal since reducing atorvastatin 10 mg to twice weekly due to myalgias. .  Recommend increase dose to 20 mg three times weekly

## 2020-04-18 NOTE — Patient Instructions (Addendum)
Your diabetes is under excellent control currently. You have lowered your a1c  to 6.5  Which is EXCELLENT!Marland Kitchen   Please plan to return in 6  months for follow up on diabetes.   Fasting labs can be done a day or two prior to visit so we can discuss them at your visit. Also, if you have not done so,  make sure you are seeing your eye doctor at least once a year.  Regarding your hair health,  I recommend trying a multivitamin called "Hair skin and Nails "   Regarding your cholesterol,  Our  Goal is 20 mg atorvastatin 3 times weekly   Don't worry about the weight.  Resuming your exercise gradually will get it off   I hope you have a  wonderful and blessed Christmas season,    Dr. Derrel Nip

## 2020-04-19 ENCOUNTER — Other Ambulatory Visit: Payer: Self-pay | Admitting: Internal Medicine

## 2020-04-19 ENCOUNTER — Ambulatory Visit: Payer: Medicare Other | Admitting: Internal Medicine

## 2020-04-22 ENCOUNTER — Ambulatory Visit: Payer: Medicare Other

## 2020-04-24 ENCOUNTER — Ambulatory Visit (INDEPENDENT_AMBULATORY_CARE_PROVIDER_SITE_OTHER): Payer: Medicare Other

## 2020-04-24 VITALS — Ht 61.0 in | Wt 152.0 lb

## 2020-04-24 DIAGNOSIS — Z0000 Encounter for general adult medical examination without abnormal findings: Secondary | ICD-10-CM

## 2020-04-24 NOTE — Patient Instructions (Addendum)
Kim Sanchez , Thank you for taking time to come for your Medicare Wellness Visit. I appreciate your ongoing commitment to your health goals. Please review the following plan we discussed and let me know if I can assist you in the future.   These are the goals we discussed: Goals      Patient Stated   .  Increase physical activity (pt-stated)      Walk more for exercise  Lose about 20lb       This is a list of the screening recommended for you and due dates:  Health Maintenance  Topic Date Due  . COVID-19 Vaccine (4 - Booster for Pfizer series) 08/13/2020  . Hemoglobin A1C  10/16/2020  . Urine Protein Check  10/16/2020  . Complete foot exam   10/18/2020  . Eye exam for diabetics  04/03/2021  . Tetanus Vaccine  07/24/2025  . Flu Shot  Completed  . DEXA scan (bone density measurement)  Completed  . Pneumonia vaccines  Completed  .  Hepatitis C: One time screening is recommended by Center for Disease Control  (CDC) for  adults born from [birth year] through 1965.   Discontinued    Immunizations Immunization History  Administered Date(s) Administered  . Fluad Quad(high Dose 65+) 01/19/2020  . Influenza Split 02/01/2013  . Influenza Whole 01/17/2012  . Influenza, High Dose Seasonal PF 02/02/2016, 01/13/2017, 02/04/2018, 02/06/2019  . Influenza,inj,Quad PF,6+ Mos 03/20/2015  . Influenza-Unspecified 03/05/2014  . PFIZER SARS-COV-2 Vaccination 05/15/2019, 06/05/2019, 02/13/2020  . Pneumococcal Conjugate-13 07/24/2014, 10/06/2017  . Pneumococcal Polysaccharide-23 12/16/2012  . Td 03/19/2006  . Tdap 07/25/2015  . Zoster Recombinat (Shingrix) 07/04/2018, 11/01/2018   Keep all routine maintenance appointments.   Next scheduled lab 10/15/20 @ 8:00  Follow up 10/17/20 @ 9:00  Advanced directives: on file  Conditions/risks identified: none new  Follow up in one year for your annual wellness visit    Preventive Care 65 Years and Older, Female Preventive care refers to lifestyle choices  and visits with your health care provider that can promote health and wellness. What does preventive care include?  A yearly physical exam. This is also called an annual well check.  Dental exams once or twice a year.  Routine eye exams. Ask your health care provider how often you should have your eyes checked.  Personal lifestyle choices, including:  Daily care of your teeth and gums.  Regular physical activity.  Eating a healthy diet.  Avoiding tobacco and drug use.  Limiting alcohol use.  Practicing safe sex.  Taking low-dose aspirin every day.  Taking vitamin and mineral supplements as recommended by your health care provider. What happens during an annual well check? The services and screenings done by your health care provider during your annual well check will depend on your age, overall health, lifestyle risk factors, and family history of disease. Counseling  Your health care provider may ask you questions about your:  Alcohol use.  Tobacco use.  Drug use.  Emotional well-being.  Home and relationship well-being.  Sexual activity.  Eating habits.  History of falls.  Memory and ability to understand (cognition).  Work and work Statistician.  Reproductive health. Screening  You may have the following tests or measurements:  Height, weight, and BMI.  Blood pressure.  Lipid and cholesterol levels. These may be checked every 5 years, or more frequently if you are over 10 years old.  Skin check.  Lung cancer screening. You may have this screening every year starting at  age 21 if you have a 30-pack-year history of smoking and currently smoke or have quit within the past 15 years.  Fecal occult blood test (FOBT) of the stool. You may have this test every year starting at age 63.  Flexible sigmoidoscopy or colonoscopy. You may have a sigmoidoscopy every 5 years or a colonoscopy every 10 years starting at age 50.  Hepatitis C blood test.  Hepatitis  B blood test.  Sexually transmitted disease (STD) testing.  Diabetes screening. This is done by checking your blood sugar (glucose) after you have not eaten for a while (fasting). You may have this done every 1-3 years.  Bone density scan. This is done to screen for osteoporosis. You may have this done starting at age 78.  Mammogram. This may be done every 1-2 years. Talk to your health care provider about how often you should have regular mammograms. Talk with your health care provider about your test results, treatment options, and if necessary, the need for more tests. Vaccines  Your health care provider may recommend certain vaccines, such as:  Influenza vaccine. This is recommended every year.  Tetanus, diphtheria, and acellular pertussis (Tdap, Td) vaccine. You may need a Td booster every 10 years.  Zoster vaccine. You may need this after age 14.  Pneumococcal 13-valent conjugate (PCV13) vaccine. One dose is recommended after age 52.  Pneumococcal polysaccharide (PPSV23) vaccine. One dose is recommended after age 9. Talk to your health care provider about which screenings and vaccines you need and how often you need them. This information is not intended to replace advice given to you by your health care provider. Make sure you discuss any questions you have with your health care provider. Document Released: 05/17/2015 Document Revised: 01/08/2016 Document Reviewed: 02/19/2015 Elsevier Interactive Patient Education  2017 [place] Prevention in the Home Falls can cause injuries. They can happen to people of all ages. There are many things you can do to make your home safe and to help prevent falls. What can I do on the outside of my home?  Regularly fix the edges of walkways and driveways and fix any cracks.  Remove anything that might make you trip as you walk through a door, such as a raised step or threshold.  Trim any bushes or trees on the path to your  home.  Use bright outdoor lighting.  Clear any walking paths of anything that might make someone trip, such as rocks or tools.  Regularly check to see if handrails are loose or broken. Make sure that both sides of any steps have handrails.  Any raised decks and porches should have guardrails on the edges.  Have any leaves, snow, or ice cleared regularly.  Use sand or salt on walking paths during winter.  Clean up any spills in your garage right away. This includes oil or grease spills. What can I do in the bathroom?  Use night lights.  Install grab bars by the toilet and in the tub and shower. Do not use towel bars as grab bars.  Use non-skid mats or decals in the tub or shower.  If you need to sit down in the shower, use a plastic, non-slip stool.  Keep the floor dry. Clean up any water that spills on the floor as soon as it happens.  Remove soap buildup in the tub or shower regularly.  Attach bath mats securely with double-sided non-slip rug tape.  Do not have throw rugs and other things on  the floor that can make you trip. What can I do in the bedroom?  Use night lights.  Make sure that you have a light by your bed that is easy to reach.  Do not use any sheets or blankets that are too big for your bed. They should not hang down onto the floor.  Have a firm chair that has side arms. You can use this for support while you get dressed.  Do not have throw rugs and other things on the floor that can make you trip. What can I do in the kitchen?  Clean up any spills right away.  Avoid walking on wet floors.  Keep items that you use a lot in easy-to-reach places.  If you need to reach something above you, use a strong step stool that has a grab bar.  Keep electrical cords out of the way.  Do not use floor polish or wax that makes floors slippery. If you must use wax, use non-skid floor wax.  Do not have throw rugs and other things on the floor that can make you  trip. What can I do with my stairs?  Do not leave any items on the stairs.  Make sure that there are handrails on both sides of the stairs and use them. Fix handrails that are broken or loose. Make sure that handrails are as long as the stairways.  Check any carpeting to make sure that it is firmly attached to the stairs. Fix any carpet that is loose or worn.  Avoid having throw rugs at the top or bottom of the stairs. If you do have throw rugs, attach them to the floor with carpet tape.  Make sure that you have a light switch at the top of the stairs and the bottom of the stairs. If you do not have them, ask someone to add them for you. What else can I do to help prevent falls?  Wear shoes that:  Do not have high heels.  Have rubber bottoms.  Are comfortable and fit you well.  Are closed at the toe. Do not wear sandals.  If you use a stepladder:  Make sure that it is fully opened. Do not climb a closed stepladder.  Make sure that both sides of the stepladder are locked into place.  Ask someone to hold it for you, if possible.  Clearly mark and make sure that you can see:  Any grab bars or handrails.  First and last steps.  Where the edge of each step is.  Use tools that help you move around (mobility aids) if they are needed. These include:  Canes.  Walkers.  Scooters.  Crutches.  Turn on the lights when you go into a dark area. Replace any light bulbs as soon as they burn out.  Set up your furniture so you have a clear path. Avoid moving your furniture around.  If any of your floors are uneven, fix them.  If there are any pets around you, be aware of where they are.  Review your medicines with your doctor. Some medicines can make you feel dizzy. This can increase your chance of falling. Ask your doctor what other things that you can do to help prevent falls. This information is not intended to replace advice given to you by your health care provider. Make  sure you discuss any questions you have with your health care provider. Document Released: 02/14/2009 Document Revised: 09/26/2015 Document Reviewed: 05/25/2014 Elsevier Interactive Patient Education  2017 [place].

## 2020-04-24 NOTE — Progress Notes (Signed)
Subjective:   Kim Sanchez is a 79 y.o. female who presents for Medicare Annual (Subsequent) preventive examination.  Review of Systems    No ROS.  Medicare Wellness Virtual Visit.   Cardiac Risk Factors include: advanced age (>56mn, >>57women);hypertension;diabetes mellitus     Objective:    Today's Vitals   04/24/20 0836  Weight: 152 lb (68.9 kg)  Height: _0  (1.549 m)   Body mass index is 28.72 kg/m.  Advanced Directives 04/24/2020 03/14/2020 04/20/2019 03/16/2019 04/15/2018 02/24/2018 08/23/2017  Does Patient Have a Medical Advance Directive? _1  Yes Yes  Type of AParamedicof AOak[place]Living will [place] will [place]Living will Living will;Healthcare Power of [place]Union SpringsLiving will HHumboldtLiving will [place]Living will  Does patient want to make changes to medical advance directive? No - Patient declined - No - Patient declined - No - Patient declined No - Patient declined No - Patient declined  Copy of HLiber[place]in Chart? Yes - validated most recent copy scanned in chart (See row information) - Yes - validated most recent copy scanned in chart (See row information) - Yes - validated most recent copy scanned in chart (See row information) No - copy requested No - copy requested  Would patient like information on creating a medical advance directive? - - - - - - -    Current Medications (verified) Outpatient Encounter Medications as of 04/24/2020  Medication Sig  . aspirin EC 81 MG tablet Take 81 mg by mouth daily.  .Marland Kitchenatorvastatin (LIPITOR) 10 MG tablet Take by mouth every other day.   . bisoprolol (ZEBETA) 5 MG tablet Take 1 tablet by mouth once daily  . calcium carbonate (TUMS - DOSED IN MG ELEMENTAL CALCIUM) 500 MG chewable tablet Chew 1 tablet by mouth daily.  . chlorthalidone (HYGROTON) 25  MG tablet Take 1 tablet by mouth once daily  . furosemide (LASIX) 20 MG tablet TAKE 1 TABLET BY MOUTH ONCE DAILY AS NEEDED FOR FLUID RETENTION.  . inFLIXimab (REMICADE) 100 MG injection Inject 1 mg into the vein every 8 (eight) weeks.  .Marland KitchenLORazepam (ATIVAN) 0.5 MG tablet TAKE 1 TABLET BY MOUTH EVERY 8 HOURS AS NEEDED FOR ANXIETY -  MAX  OF  2  TABLETS  PER  DAY  . Multiple Vitamins-Minerals (MULTIVITAMIN ADULT PO) Take by mouth.  . nystatin (MYCOSTATIN) 100000 UNIT/ML suspension SWISH & SWALLOW 5 ML BY MOUTH 4 TIMES DAILY  . Omega-3 Fatty Acids (FISH OIL PO) Take by mouth.  . polyethylene glycol powder (GLYCOLAX/MIRALAX) powder Take 17 g by mouth 2 (two) times daily as needed.  . [DISCONTINUED] losartan-hydrochlorothiazide (HYZAAR) 50-12.5 MG tablet Take 1 tablet by mouth daily.   No facility-administered encounter medications on file as of 04/24/2020.    Allergies (verified) Chlorthalidone, Letrozole, Methocarbamol, Metoprolol, Amlodipine, Exemestane, Hydrochlorothiazide, Lexapro [escitalopram oxalate], Lisinopril, Losartan, and Sulfa antibiotics   History: Past Medical History:  Diagnosis Date  . Arthritis   . Breast cancer ([place]   . 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  . Crohn's disease ([place]   . GERD (gastroesophageal reflux disease)   . History of blood transfusion 1960  . Hypertension   . Personal history of radiation therapy    Past Surgical History:  Procedure Laterality Date  . BRAIN SURGERY  1980   breast biopsies, benign  . 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 BREAST BIOPSY Bilateral    - core Bx- neg  . BREAST LUMPECTOMY Right May 2014   T1c, N0; ER/PR positive, HER-2/neu not overexpressing.  Marland Kitchen BREAST SURGERY  1980's   fibro cyst both breast in [place]  . COLONOSCOPY  2017  .  DILATION AND CURETTAGE OF UTERUS     Family History  Problem Relation Age of Onset  . Cancer Paternal Grandmother   . Heart disease Sister   . Diabetes Sister   . Breast cancer Neg Hx    Social History   Socioeconomic History  . Marital status: Married    Spouse name: Not on file  . Number of children: Not on file  . Years of education: Not on file  . Highest education level: Not on file  Occupational History  . Not on file  Tobacco Use  . Smoking status: Never Smoker  . Smokeless tobacco: Never Used  Vaping Use  . Vaping Use: Never used  Substance and Sexual Activity  . Alcohol use: Yes    Alcohol/week: 1.0 standard drink    Types: 1 Glasses of wine per week    Comment: OCC  . Drug use: No  . Sexual activity: Not Currently  Other Topics Concern  . Not on file  Social History Narrative  . Not on file   Social Determinants of Health   Financial Resource Strain: Low Risk   . Difficulty of Paying Living Expenses: Not hard at all  Food Insecurity: No Food Insecurity  . Worried About Charity fundraiser in the Last Year: Never true  . Ran Out of Food in the Last Year: Never true  Transportation Needs: No Transportation Needs  . Lack of Transportation (Medical): No  . Lack of Transportation (Non-Medical): No  Physical Activity: Not on file  Stress: No Stress Concern Present  . Feeling of Stress : Not at all  Social Connections: Unknown  . Frequency of Communication with Friends and Family: More than three times a week  . Frequency of Social Gatherings with Friends and Family: Not on file  . Attends Religious Services: Not on file  . Active Member of Clubs or Organizations: Not on file  . Attends Archivist Meetings: Not on file  . Marital Status: Married    Tobacco Counseling Counseling given: Not Answered   Clinical Intake:  Pre-visit preparation completed: Yes        Diabetes: No  How often do you need to have someone help you when you  read instructions, pamphlets, or other written materials from your doctor or pharmacy?: 1 - Never   Interpreter Needed?: No      Activities of Daily Living In your present state of health, do you have any difficulty performing the following activities: 04/24/2020  Hearing? Y  Comment Hearing aids  Vision? N  Difficulty concentrating or making decisions? N  Walking or climbing stairs? N  Dressing or bathing? N  Doing errands, shopping? N  Preparing Food and eating ? N  Using the Toilet? N  In the past six months, have you accidently leaked urine? N  Do you have problems with loss of bowel control? N  Managing your Medications? N  Managing your Finances? N  Housekeeping or managing your Housekeeping? N  Some recent data might be hidden    Patient  Care Team: [name], MD as PCP - General (Internal Medicine) Bary Castilla [place] Gleason, MD (General Surgery) [name], MD (Internal Medicine)  Indicate any recent Medical Services you may have received from other than [facility] providers in the past year (date may be approximate).     Assessment:   This is a routine wellness examination for [place].  I connected with Kwana today by telephone and verified that I am speaking with the correct person using two identifiers. Location patient: home Location provider: work Persons participating in the virtual visit: patient, Marine scientist.    I discussed the limitations, risks, security and privacy concerns of performing an evaluation and management service by telephone and the availability of in person appointments. The patient expressed understanding and verbally consented to this telephonic visit.    Interactive audio and video telecommunications were attempted between this provider and patient, however failed, due to patient having technical difficulties OR patient did not have access to video capability.  We continued and completed visit with audio only.  Some vital signs may be absent or  patient reported.   Hearing/Vision screen  Hearing Screening   _0  _1  _2  _3  _4  _5  _6  _7  _8   Right ear:           Left ear:           Comments: Hearing aid, bilateral   Vision Screening Comments: Followed by [facility]  Wears corrective lenses  Visual acuity not assessed per patient preference since they have regular follow up with the ophthalmologist  Dietary issues and exercise activities discussed: Current Exercise Habits: Home exercise routine  Low cholesterol diet Good water intake  Goals      Patient Stated   .  Increase physical activity (pt-stated)      Walk more for exercise  Lose about 20lb      Depression Screen PHQ 2/9 Scores 04/24/2020 04/20/2019 04/15/2018 04/14/2017 01/06/2017 04/14/2016 02/06/2016  PHQ - 2 Score 0 0 0 0 0 0 0    Fall Risk Fall Risk  04/24/2020 04/18/2020 10/19/2019 07/19/2019 07/04/2019  Falls in the past year? 1 1 0 0 0  Comment - - - - -  Number falls in past yr: 0 0 - - -  Injury with Fall? 0 0 - - -  Follow up _9     FALL RISK PREVENTION PERTAINING TO THE HOME: Handrails in use when climbing stairs? Yes Home free of loose throw rugs in walkways, pet beds, electrical cords, etc? Yes  Adequate lighting in your home to reduce risk of falls? Yes   ASSISTIVE DEVICES UTILIZED TO PREVENT FALLS: Use of a cane, walker or w/c? No  Grab bars in the bathroom? Yes  Shower chair or bench in shower? Yes  Elevated toilet seat or a handicapped toilet? Yes   TIMED UP AND GO: Was the test performed? No . Virtual visit.   Cognitive Function: Patient is alert and oriented x3. Denies difficulty focusing, making decisions, memory loss.  Enjoys reading.  MMSE/6CIT deferred. Normal by direct communication/observation. MMSE - Mini Mental State Exam 04/14/2017 04/14/2016  Orientation to  time 5 5  Orientation to Place 5 5  Registration 3 3  Attention/ Calculation 5 5  Recall 3 3  Language- name 2 objects 2 2  Language- repeat 1 1  Language- follow 3 step command 3 3  Language- read & follow direction 1 1  Write a sentence 1 1  Copy design 1 1  Total score 30 30     6CIT Screen 04/20/2019 04/15/2018  What Year? 0 points 0 points  What month? 0 points 0 points  What time? 0 points 0 points  Count back from 20 0 points 0 points  Months in reverse 0 points 0 points  Repeat phrase - 0 points  Total Score - 0    Immunizations Immunization History  Administered Date(s) Administered  . Fluad Quad(high Dose 65+) 01/19/2020  . Influenza Split 02/01/2013  . Influenza Whole 01/17/2012  . Influenza, High Dose Seasonal PF 02/02/2016, 01/13/2017, 02/04/2018, 02/06/2019  . Influenza,inj,Quad PF,6+ Mos 03/20/2015  . Influenza-Unspecified 03/05/2014  . PFIZER SARS-COV-2 Vaccination 05/15/2019, 06/05/2019, 02/13/2020  . Pneumococcal Conjugate-13 07/24/2014, 10/06/2017  . Pneumococcal Polysaccharide-23 12/16/2012  . Td 03/19/2006  . Tdap 07/25/2015  . Zoster Recombinat (Shingrix) 07/04/2018, 11/01/2018    Health Maintenance Health Maintenance  Topic Date Due  . COVID-19 Vaccine (4 - Booster for Pfizer series) 08/13/2020  . HEMOGLOBIN A1C  10/16/2020  . URINE MICROALBUMIN  10/16/2020  . FOOT EXAM  10/18/2020  . OPHTHALMOLOGY EXAM  04/03/2021  . TETANUS/TDAP  07/24/2025  . INFLUENZA VACCINE  Completed  . DEXA SCAN  Completed  . PNA vac Low Risk Adult  Completed  . Hepatitis C Screening  Discontinued   Colorectal cancer screening: Type of screening: Colonoscopy. Completed 10/08/15. Repeat every 5 years   Mammogram status: Completed 12/04/19. Repeat every year  Bone Density status: Completed 03/15/18. Results reflect: Bone density results: OSTEOPENIA. Repeat every 2 years.  Lung Cancer Screening: (Low Dose CT Chest recommended if Age 74-80 years, 30 pack-year  currently smoking OR have quit w/in 15years.) does not qualify.   Hepatitis C Screening: does not qualify.  Vision Screening: Recommended annual ophthalmology exams for early detection of glaucoma and other disorders of the eye. Is the patient up to date with their annual eye exam?  Yes  Who is the provider or what is the name of the office in which the patient attends annual eye exams? [facility].  Dental Screening: Recommended annual dental exams for proper oral hygiene.  Community Resource Referral / Chronic Care Management: CRR required this visit?  No   CCM required this visit?  No      Plan:   Keep all routine maintenance appointments.   Next scheduled lab 10/15/20 @ 8:00  Follow up 10/17/20 @ 9:00  I have personally reviewed and noted the following in the patient's chart:   . Medical and social history . Use of alcohol, tobacco or illicit drugs  . Current medications and supplements . Functional ability and status . Nutritional status . Physical activity . Advanced directives . List of other physicians . Hospitalizations, surgeries, and ER visits in previous 12 months . Vitals . Screenings to include cognitive, depression, and falls . Referrals and appointments  In addition, I have reviewed and discussed with patient certain preventive protocols, quality metrics, and best practice recommendations. A written personalized care plan for preventive services as well as general preventive health recommendations were provided to patient via mychart.     Varney Biles, LPN   [phone]

## 2020-05-31 ENCOUNTER — Other Ambulatory Visit: Payer: Self-pay | Admitting: Internal Medicine

## 2020-06-07 DIAGNOSIS — K5080 Crohn's disease of both small and large intestine without complications: Secondary | ICD-10-CM | POA: Diagnosis not present

## 2020-08-02 DIAGNOSIS — K5000 Crohn's disease of small intestine without complications: Secondary | ICD-10-CM | POA: Diagnosis not present

## 2020-08-02 DIAGNOSIS — Z79899 Other long term (current) drug therapy: Secondary | ICD-10-CM | POA: Diagnosis not present

## 2020-08-02 DIAGNOSIS — K5080 Crohn's disease of both small and large intestine without complications: Secondary | ICD-10-CM | POA: Diagnosis not present

## 2020-08-20 ENCOUNTER — Other Ambulatory Visit: Payer: Self-pay | Admitting: Internal Medicine

## 2020-09-25 ENCOUNTER — Other Ambulatory Visit: Payer: Self-pay | Admitting: Internal Medicine

## 2020-09-25 DIAGNOSIS — Z1231 Encounter for screening mammogram for malignant neoplasm of breast: Secondary | ICD-10-CM

## 2020-10-03 DIAGNOSIS — K5080 Crohn's disease of both small and large intestine without complications: Secondary | ICD-10-CM | POA: Diagnosis not present

## 2020-10-03 DIAGNOSIS — K5000 Crohn's disease of small intestine without complications: Secondary | ICD-10-CM | POA: Diagnosis not present

## 2020-10-15 ENCOUNTER — Other Ambulatory Visit (INDEPENDENT_AMBULATORY_CARE_PROVIDER_SITE_OTHER): Payer: Medicare Other

## 2020-10-15 ENCOUNTER — Other Ambulatory Visit: Payer: Self-pay

## 2020-10-15 DIAGNOSIS — E782 Mixed hyperlipidemia: Secondary | ICD-10-CM

## 2020-10-15 DIAGNOSIS — E1169 Type 2 diabetes mellitus with other specified complication: Secondary | ICD-10-CM

## 2020-10-15 LAB — LIPID PANEL
Cholesterol: 213 mg/dL — ABNORMAL HIGH (ref 0–200)
HDL: 60.3 mg/dL (ref >39.00)
LDL Cholesterol: 123 mg/dL — ABNORMAL HIGH (ref 0–99)
NonHDL: 152.63
Total CHOL/HDL Ratio: 4
Triglycerides: 146 mg/dL (ref 0.0–149.0)
VLDL: 29.2 mg/dL (ref 0.0–40.0)

## 2020-10-15 LAB — COMPREHENSIVE METABOLIC PANEL
ALT: 16 U/L (ref 0–35)
AST: 17 U/L (ref 0–37)
Albumin: 4 g/dL (ref 3.5–5.2)
Alkaline Phosphatase: 59 U/L (ref 39–117)
BUN: 16 mg/dL (ref 6–23)
CO2: 28 mEq/L (ref 19–32)
Calcium: 9.2 mg/dL (ref 8.4–10.5)
Chloride: 102 mEq/L (ref 96–112)
Creatinine, Ser: 0.8 mg/dL (ref 0.40–1.20)
GFR: 70.05 mL/min (ref >60.00)
Glucose, Bld: 125 mg/dL — ABNORMAL HIGH (ref 70–99)
Potassium: 3.7 mEq/L (ref 3.5–5.1)
Sodium: 140 mEq/L (ref 135–145)
Total Bilirubin: 1.1 mg/dL (ref 0.2–1.2)
Total Protein: 7.3 g/dL (ref 6.0–8.3)

## 2020-10-15 LAB — MICROALBUMIN / CREATININE URINE RATIO
Creatinine,U: 138 mg/dL
Microalb Creat Ratio: 0.5 mg/g (ref 0.0–30.0)
Microalb, Ur: <0.7 mg/dL (ref 0.0–1.9)

## 2020-10-15 LAB — HEMOGLOBIN A1C: Hgb A1c MFr Bld: 6.9 % — ABNORMAL HIGH (ref 4.6–6.5)

## 2020-10-17 ENCOUNTER — Other Ambulatory Visit: Payer: Self-pay

## 2020-10-17 ENCOUNTER — Ambulatory Visit (INDEPENDENT_AMBULATORY_CARE_PROVIDER_SITE_OTHER): Payer: Medicare Other | Admitting: Internal Medicine

## 2020-10-17 ENCOUNTER — Encounter: Payer: Self-pay | Admitting: Internal Medicine

## 2020-10-17 VITALS — BP 136/72 | HR 56 | Temp 96.3°F | Resp 14 | Ht 61.0 in | Wt 154.6 lb

## 2020-10-17 DIAGNOSIS — E1169 Type 2 diabetes mellitus with other specified complication: Secondary | ICD-10-CM

## 2020-10-17 DIAGNOSIS — E782 Mixed hyperlipidemia: Secondary | ICD-10-CM

## 2020-10-17 DIAGNOSIS — R5383 Other fatigue: Secondary | ICD-10-CM

## 2020-10-17 DIAGNOSIS — K5909 Other constipation: Secondary | ICD-10-CM | POA: Diagnosis not present

## 2020-10-17 DIAGNOSIS — I10 Essential (primary) hypertension: Secondary | ICD-10-CM

## 2020-10-17 DIAGNOSIS — K5010 Crohn's disease of large intestine without complications: Secondary | ICD-10-CM | POA: Diagnosis not present

## 2020-10-17 NOTE — Assessment & Plan Note (Signed)
Managed with remicaide,  [facility]

## 2020-10-17 NOTE — Progress Notes (Signed)
Subjective:  Patient ID: Kim Sanchez, female    DOB: [birth year]  Age: 80 y.o. MRN: [id number]  CC: The primary encounter diagnosis was DM type 2 with diabetic mixed hyperlipidemia ([place]). Diagnoses of Crohn's disease of large intestine without complication ([place]), Chronic constipation, Caregiver with fatigue, and Essential hypertension were also pertinent to this visit.  HPI Kim Sanchez presents for follow up on hypertension, hyperlipidemia, type 2 DM,  and anxiety  This visit occurred during the SARS-CoV-2 public health emergency.  Safety protocols were in place, including screening questions prior to the visit, additional usage of staff PPE, and extensive cleaning of exam room while observing appropriate contact time as indicated for disinfecting solutions.    Cc: fatigue:  the symptoms is chronic .  Sleeping habits reviewed:  she has 2 bathroom breaks ,  and frequent nightmares .Averages 6-7 hours per night.  Does not exercise regularly due to the physical and emotional stress of caring for husband who has become embittered and resentful since he suffered a stroke.  She admits to having a difficult time u at home because her hsband is very critical of her efforts,  not appreciative,  and very needy  and often tells her that  she " can't do anything right."    HTN:  on her own she has reduced dose of bisoprolol dose to 2.5 mg daily for home readings that were frequently dropping to 10 systolic  home readings now 280 systolic  Taking atorvastatin 2/week due to side effects.  Reviewed diabetes control and weight gain over the past 5 years. T2DM:  She  feels generally well except for chronic fatigue, and  is not  exercising regularly or trying to lose weight. Checking  blood sugars less than once daily at variable times, usually only if she feels she may be having a hypoglycemic event. .  BS have been under 130 fasting and < 150 post prandially.  Denies any recent hypoglyemic events.  Taking    medications as directed. Following a carbohydrate modified diet 6 days per week. Denies numbness, burning and tingling of extremities. Appetite is good.    Outpatient Medications Prior to Visit  Medication Sig Dispense Refill   aspirin EC 81 MG tablet Take 81 mg by mouth daily.     atorvastatin (LIPITOR) 10 MG tablet Take by mouth every other day.   11   bisoprolol (ZEBETA) 5 MG tablet Take 1 tablet by mouth once daily 30 tablet 1   calcium carbonate (TUMS - DOSED IN MG ELEMENTAL CALCIUM) 500 MG chewable tablet Chew 1 tablet by mouth daily.     chlorthalidone (HYGROTON) 25 MG tablet Take 1 tablet by mouth once daily 90 tablet 0   furosemide (LASIX) 20 MG tablet TAKE 1 TABLET BY MOUTH ONCE DAILY AS NEEDED FOR FLUID RETENTION. 90 tablet 0   inFLIXimab (REMICADE) 100 MG injection Inject 1 mg into the vein every 8 (eight) weeks.     LORazepam (ATIVAN) 0.5 MG tablet TAKE 1 TABLET BY MOUTH EVERY 8 HOURS AS NEEDED FOR ANXIETY -  MAX  OF  2  TABLETS  PER  DAY 60 tablet 5   Multiple Vitamins-Minerals (MULTIVITAMIN ADULT PO) Take by mouth.     nystatin (MYCOSTATIN) 100000 UNIT/ML suspension SWISH & SWALLOW 5 ML BY MOUTH 4 TIMES DAILY  1   Omega-3 Fatty Acids (FISH OIL PO) Take by mouth.     polyethylene glycol powder (GLYCOLAX/MIRALAX) powder Take 17 g by mouth 2 (  two) times daily as needed. 3350 g 11   No facility-administered medications prior to visit.    Review of Systems;  Patient denies headache, fevers, malaise, unintentional weight loss, skin rash, eye pain, sinus congestion and sinus pain, sore throat, dysphagia,  hemoptysis , cough, dyspnea, wheezing, chest pain, palpitations, orthopnea, edema, abdominal pain, nausea, melena, diarrhea, constipation, flank pain, dysuria, hematuria, urinary  Frequency, nocturia, numbness, tingling, seizures,  Focal weakness, Loss of consciousness,  Tremor, insomnia, depression, anxiety, and suicidal ideation.      Objective:  BP 136/72 (BP Location: Left  Arm, Patient Position: Sitting, Cuff Size: Normal)   Pulse (!) 56   Temp (!) 96.3 F (35.7 C) (Temporal)   Resp 14   Ht 5' 1"  (1.549 m)   Wt 154 lb 9.6 oz (70.1 kg)   SpO2 98%   BMI 29.21 kg/m   BP Readings from Last 3 Encounters:  10/17/20 136/72  04/18/20 120/74  03/14/20 128/90    Wt Readings from Last 3 Encounters:  10/17/20 154 lb 9.6 oz (70.1 kg)  04/24/20 152 lb (68.9 kg)  04/18/20 152 lb 12.8 oz (69.3 kg)    General appearance: alert, cooperative and appears stated age Ears: normal TM's and external ear canals both ears Throat: lips, mucosa, and tongue normal; teeth and gums normal Neck: no adenopathy, no carotid bruit, supple, symmetrical, trachea midline and thyroid not enlarged, symmetric, no tenderness/mass/nodules Back: symmetric, no curvature. ROM normal. No CVA tenderness. Lungs: clear to auscultation bilaterally Heart: regular rate and rhythm, S1, S2 normal, no murmur, click, rub or gallop Abdomen: soft, non-tender; bowel sounds normal; no masses,  no organomegaly Pulses: 2+ and symmetric Skin: Skin color, texture, turgor normal. No rashes or lesions Lymph nodes: Cervical, supraclavicular, and axillary nodes normal.  Lab Results  Component Value Date   HGBA1C 6.9 (H) 10/15/2020   HGBA1C 6.5 04/17/2020   HGBA1C 7.2 (H) 10/17/2019    Lab Results  Component Value Date   CREATININE 0.80 10/15/2020   CREATININE 0.74 04/17/2020   CREATININE 0.67 10/17/2019    Lab Results  Component Value Date   WBC 5.8 12/31/2016   HGB 13.5 12/31/2016   HCT 39.7 12/31/2016   PLT 215 12/31/2016   GLUCOSE 125 (H) 10/15/2020   CHOL 213 (H) 10/15/2020   TRIG 146.0 10/15/2020   HDL 60.30 10/15/2020   LDLDIRECT 151.5 10/03/2013   LDLCALC 123 (H) 10/15/2020   ALT 16 10/15/2020   AST 17 10/15/2020   NA 140 10/15/2020   K 3.7 10/15/2020   CL 102 10/15/2020   CREATININE 0.80 10/15/2020   BUN 16 10/15/2020   CO2 28 10/15/2020   TSH 2.39 09/30/2016   HGBA1C 6.9 (H)  10/15/2020   MICROALBUR <0.7 10/15/2020    MM 3D SCREEN BREAST BILATERAL    Assessment & Plan:   Problem List Items Addressed This Visit       Unprioritized   Caregiver with fatigue    Screening labs have been repeatedly  Normal (ad were reviewed via Epic portal from Dr Scharlene Gloss orders.  She has no  history of snoring.  Recommended participating in regular exercise program with goal of achieving a minimum of 30 minutes of aerobic activity 5 days per week.         Chronic constipation    Encouraged to  increase the fiber in her diet to 25 g daily using Fruit and vegetables , [employer] and Atkins protein bars , and the low carb breads .  Also recommended adding  miralax, metamucil, fibercon, or citrucel daily to supplement her fiber. She was advised that she  can also combine them daily with colace,  Also,  make 4 16 ounce servings of water a daily goal        Crohn's disease [facility])    Managed with remicaide,  June 2 [facility]       DM type 2 with diabetic mixed hyperlipidemia ([place]) - Primary    Her diabetes remains well-controlled on diet alone. She is intolerant of ACE I and ARBs due to cough and  Constipation, respectively .  She has no proteinuria despite  Recurrent reports of "foamy urine.".  Foot exam is normal  .  Taking   atorvastatin every 3 days  Due to muscle pain with daily use,  And use of aspirin  once weekly due to nosebleeds .    Risk of CAD is 25%   Lab Results  Component Value Date   HGBA1C 6.9 (H) 10/15/2020   Lab Results  Component Value Date   MICROALBUR <0.7 10/15/2020   MICROALBUR 0.8 10/17/2019           Relevant Orders   Comprehensive metabolic panel   Lipid panel   Hemoglobin A1c   Essential hypertension    Well controlled and finally tolerating a regimen without side effects.  Continue chlorthalidone and 2.5 mg bisoprolol  Lab Results  Component Value Date   NA 140 10/15/2020   K 3.7 10/15/2020   CL 102 10/15/2020    CO2 28 10/15/2020   Lab Results  Component Value Date   CREATININE 0.80 10/15/2020            I provided  30 minutes of  face-to-face , in person time during this encounter reviewing patient's current symptoms of fatigue, home blood pressure and blood sugar readings,  past surgeries, labs and imaging studies, providing counseling on the above mentioned problems , and coordination  of care .   I am having Herbert [employer]. [employer] "AGGIE" maintain her inFLIXimab, aspirin EC, Omega-3 Fatty Acids (FISH OIL PO), calcium carbonate, atorvastatin, nystatin, polyethylene glycol powder, Multiple Vitamins-Minerals (MULTIVITAMIN ADULT PO), LORazepam, furosemide, chlorthalidone, and bisoprolol.  No orders of the defined types were placed in this encounter.    There are no discontinued medications.  Follow-up: Return in about 6 months (around 04/18/2021).   [name], MD

## 2020-10-17 NOTE — Patient Instructions (Signed)
Do not walk away when your husband says unkind things.  Let him know that it hurts your feelings and YOU DO NOT DESERVE IT.  This is not being unkind;  it is holding him accountable.   You will lose weight if you watch your portion size and start doing 30 minutes of exercise daily (try using a home exercise video)     [employer] "low carb power bowl"  entrees and  "Steamer" entrees are are great low carb entrees that microwave in 5 minutes

## 2020-10-20 DIAGNOSIS — R5383 Other fatigue: Secondary | ICD-10-CM | POA: Insufficient documentation

## 2020-10-20 NOTE — Assessment & Plan Note (Signed)
Encouraged to  increase the fiber in her diet to 25 g daily using Fruit and vegetables , [employer] and Atkins protein bars , and the low carb breads .  Also recommended adding  miralax, metamucil, fibercon, or citrucel daily to supplement her fiber. She was advised that she  can also combine them daily with colace,  Also,  make 4 16 ounce servings of water a daily goal

## 2020-10-20 NOTE — Assessment & Plan Note (Signed)
Well controlled and finally tolerating a regimen without side effects.  Continue chlorthalidone and 2.5 mg bisoprolol  Lab Results  Component Value Date   NA 140 10/15/2020   K 3.7 10/15/2020   CL 102 10/15/2020   CO2 28 10/15/2020   Lab Results  Component Value Date   CREATININE 0.80 10/15/2020

## 2020-10-20 NOTE — Assessment & Plan Note (Signed)
Screening labs have been repeatedly  Normal (ad were reviewed via Epic portal from Dr Scharlene Gloss orders.  She has no  history of snoring.  Recommended participating in regular exercise program with goal of achieving a minimum of 30 minutes of aerobic activity 5 days per week.

## 2020-10-20 NOTE — Assessment & Plan Note (Signed)
Her diabetes remains well-controlled on diet alone. She is intolerant of ACE I and ARBs due to cough and  Constipation, respectively .  She has no proteinuria despite  Recurrent reports of "foamy urine.".  Foot exam is normal  .  Taking   atorvastatin every 3 days  Due to muscle pain with daily use,  And use of aspirin  once weekly due to nosebleeds .    Risk of CAD is 25%   Lab Results  Component Value Date   HGBA1C 6.9 (H) 10/15/2020   Lab Results  Component Value Date   MICROALBUR <0.7 10/15/2020   MICROALBUR 0.8 10/17/2019

## 2020-11-14 DIAGNOSIS — H2513 Age-related nuclear cataract, bilateral: Secondary | ICD-10-CM | POA: Diagnosis not present

## 2020-11-29 DIAGNOSIS — K5080 Crohn's disease of both small and large intestine without complications: Secondary | ICD-10-CM | POA: Diagnosis not present

## 2020-12-02 ENCOUNTER — Other Ambulatory Visit: Payer: Self-pay

## 2020-12-02 ENCOUNTER — Ambulatory Visit
Admission: RE | Admit: 2020-12-02 | Discharge: 2020-12-02 | Disposition: A | Discharge status: Home / Self Care | Payer: Medicare Other | Source: Ambulatory Visit | Attending: Internal Medicine | Admitting: Internal Medicine

## 2020-12-02 DIAGNOSIS — Z1231 Encounter for screening mammogram for malignant neoplasm of breast: Secondary | ICD-10-CM | POA: Diagnosis not present

## 2020-12-18 ENCOUNTER — Other Ambulatory Visit: Payer: Self-pay | Admitting: Internal Medicine

## 2021-01-23 DIAGNOSIS — K5080 Crohn's disease of both small and large intestine without complications: Secondary | ICD-10-CM | POA: Diagnosis not present

## 2021-02-03 ENCOUNTER — Other Ambulatory Visit: Payer: Self-pay | Admitting: Internal Medicine

## 2021-02-20 ENCOUNTER — Telehealth: Payer: Self-pay | Admitting: Internal Medicine

## 2021-02-20 MED ORDER — ATORVASTATIN CALCIUM 10 MG PO TABS: 10.0000 mg | ORAL_TABLET | ORAL | 1 refills | Status: AC

## 2021-02-20 NOTE — Telephone Encounter (Signed)
Patient is calling in to check on her prescription for atorvastatin (LIPITOR) 10 MG tablet.That she was told is out of date by her pharmacy.Please call her at [phone].

## 2021-02-21 ENCOUNTER — Telehealth (INDEPENDENT_AMBULATORY_CARE_PROVIDER_SITE_OTHER): Payer: Medicare Other | Admitting: Internal Medicine

## 2021-02-21 ENCOUNTER — Other Ambulatory Visit: Payer: Self-pay

## 2021-02-21 ENCOUNTER — Encounter: Payer: Self-pay | Admitting: Internal Medicine

## 2021-02-21 VITALS — BP 125/60 | Ht 61.0 in | Wt 151.0 lb

## 2021-02-21 DIAGNOSIS — J0111 Acute recurrent frontal sinusitis: Secondary | ICD-10-CM | POA: Diagnosis not present

## 2021-02-21 DIAGNOSIS — U071 COVID-19: Secondary | ICD-10-CM | POA: Diagnosis not present

## 2021-02-21 MED ORDER — AZITHROMYCIN 250 MG PO TABS: ORAL_TABLET | ORAL | 0 refills | Status: AC

## 2021-02-21 NOTE — Progress Notes (Signed)
Onset of symptoms yesterday with sneezing, nasal drainage to the throat, runny nose, throat tickle. States this happens every year. States her drainage is clear.   No one around the Patient is sick.

## 2021-02-21 NOTE — Progress Notes (Signed)
Virtual Visit via Video Note  I connected with Kim Sanchez  on 02/21/21 at  1:30 PM EDT by a video enabled telemedicine application and verified that I am speaking with the correct person using two identifiers.  Location patient: home, [place] Location provider:work or home office Persons participating in the virtual visit: patient, provider  I discussed the limitations of evaluation and management by telemedicine and the availability of in person appointments. The patient expressed understanding and agreed to proceed.   HPI:  Acute telemedicine visit for : Frontal sinus pain b/l yesterday with runny nose, drainage in throat, sneezing, phelgm hard to get out tried delsym and bjs brand antihistamine w/o relief. She getes this dec-jan yearly went to [facility] urgent care today but wait 2.5 hours. Denies fever or sick contacts   -COVID-19 vaccine status:  ROS: See pertinent positives and negatives per HPI.  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   Crohn's disease ([place])    GERD (gastroesophageal reflux disease)    History of blood transfusion 1960   Hypertension    Personal history of radiation therapy     Past Surgical History:  Procedure Laterality Date   BRAIN SURGERY  1980   breast biopsies, benign   BREAST BIOPSY Right 2014   Invasive Mammory Carcinoma   BREAST BIOPSY Bilateral    - core Bx- neg   BREAST LUMPECTOMY Right May 2014   T1c, N0; ER/PR positive, HER-2/neu not overexpressing.   BREAST SURGERY  1980's   fibro cyst both breast in [place]   COLONOSCOPY  2017   DILATION AND CURETTAGE OF UTERUS       Current Outpatient Medications:    aspirin EC 81 MG tablet, Take 81 mg by mouth daily., Disp: , Rfl:    atorvastatin (LIPITOR) 10 MG  tablet, Take 1 tablet (10 mg total) by mouth every other day., Disp: 90 tablet, Rfl: 1   azithromycin (ZITHROMAX) 250 MG tablet, With food Take 2 tablets on day 1, then 1 tablet daily on days 2 through 5, Disp: 6 tablet, Rfl: 0   bisoprolol (ZEBETA) 5 MG tablet, Take 1 tablet by mouth once daily, Disp: 30 tablet, Rfl: 0   calcium carbonate (TUMS - DOSED IN MG ELEMENTAL CALCIUM) 500 MG chewable tablet, Chew 1 tablet by mouth daily., Disp: , Rfl:    chlorthalidone (HYGROTON) 25 MG tablet, Take 1 tablet by mouth once daily, Disp: 90 tablet, Rfl: 0   furosemide (LASIX) 20 MG tablet, TAKE 1 TABLET BY MOUTH ONCE DAILY AS NEEDED FOR FLUID RETENTION., Disp: 90 tablet, Rfl: 0   inFLIXimab (REMICADE) 100 MG injection, Inject 1 mg into the vein every 8 (eight) weeks., Disp: , Rfl:    LORazepam (ATIVAN) 0.5 MG tablet, TAKE 1 TABLET BY MOUTH EVERY 8 HOURS AS NEEDED FOR ANXIETY -  MAX  OF  2  TABLETS  PER  DAY, Disp: 60 tablet, Rfl: 5   Multiple Vitamins-Minerals (MULTIVITAMIN ADULT PO), Take by mouth., Disp: , Rfl:    Omega-3 Fatty Acids (FISH OIL PO), Take by mouth., Disp: , Rfl:    polyethylene glycol powder (GLYCOLAX/MIRALAX) powder, Take 17 g by mouth 2 (two) times daily as needed., Disp: 3350 g, Rfl: 11   nystatin (MYCOSTATIN) 100000 UNIT/ML  suspension, SWISH & SWALLOW 5 ML BY MOUTH 4 TIMES DAILY (Patient not taking: Reported on 02/21/2021), Disp: , Rfl: 1  EXAM:  VITALS per patient if applicable:  GENERAL: alert, oriented, appears well and in no acute distress  HEENT: atraumatic, conjunttiva clear, no obvious abnormalities on inspection of external nose and ears  NECK: normal movements of the head and neck  LUNGS: on inspection no signs of respiratory distress, breathing rate appears normal, no obvious gross SOB, gasping or wheezing  CV: no obvious cyanosis  MS: moves all visible extremities without noticeable abnormality  PSYCH/NEURO: pleasant and cooperative, no obvious depression or anxiety,  speech and thought processing grossly intact  ASSESSMENT AND PLAN:  Discussed the following assessment and plan:  Acute recurrent frontal sinusitis - Plan: azithromycin (ZITHROMAX) 250 MG tablet Supportive care call back if not better  Ns, flonase, delsym prn Mvt Warm tear honey and lemon  Consider home covid 19 test  Otc Antihistamine prn Bjs brand   -we discussed possible serious and likely etiologies, options for evaluation and workup, limitations of telemedicine visit vs in person visit, treatment, treatment risks and precautions. Pt is agreeable to treatment via telemedicine at this moment.   I discussed the assessment and treatment plan with the patient. The patient was provided an opportunity to ask questions and all were answered. The patient agreed with the plan and demonstrated an understanding of the instructions.    Time spent 10 minutes  Delorise Jackson, MD

## 2021-03-03 DIAGNOSIS — Z23 Encounter for immunization: Secondary | ICD-10-CM | POA: Diagnosis not present

## 2021-03-21 DIAGNOSIS — K5080 Crohn's disease of both small and large intestine without complications: Secondary | ICD-10-CM | POA: Diagnosis not present

## 2021-04-05 ENCOUNTER — Other Ambulatory Visit: Payer: Self-pay | Admitting: Internal Medicine

## 2021-04-07 NOTE — Telephone Encounter (Signed)
RX Refill: ativan Last Seen: 10-17-20 Last Ordered: 02-14-20 Next Appt: 04-23-21

## 2021-04-21 ENCOUNTER — Other Ambulatory Visit: Payer: Self-pay

## 2021-04-21 ENCOUNTER — Other Ambulatory Visit: Payer: Medicare Other

## 2021-04-21 ENCOUNTER — Other Ambulatory Visit (INDEPENDENT_AMBULATORY_CARE_PROVIDER_SITE_OTHER): Payer: Medicare Other

## 2021-04-21 DIAGNOSIS — E782 Mixed hyperlipidemia: Secondary | ICD-10-CM

## 2021-04-21 DIAGNOSIS — E1169 Type 2 diabetes mellitus with other specified complication: Secondary | ICD-10-CM | POA: Diagnosis not present

## 2021-04-21 LAB — LIPID PANEL
Cholesterol: 220 mg/dL — ABNORMAL HIGH (ref 0–200)
HDL: 67.4 mg/dL (ref >39.00)
LDL Cholesterol: 127 mg/dL — ABNORMAL HIGH (ref 0–99)
NonHDL: 152.26
Total CHOL/HDL Ratio: 3
Triglycerides: 124 mg/dL (ref 0.0–149.0)
VLDL: 24.8 mg/dL (ref 0.0–40.0)

## 2021-04-21 LAB — HEMOGLOBIN A1C: Hgb A1c MFr Bld: 6.9 % — ABNORMAL HIGH (ref 4.6–6.5)

## 2021-04-21 LAB — COMPREHENSIVE METABOLIC PANEL
ALT: 13 U/L (ref 0–35)
AST: 17 U/L (ref 0–37)
Albumin: 4 g/dL (ref 3.5–5.2)
Alkaline Phosphatase: 85 U/L (ref 39–117)
BUN: 16 mg/dL (ref 6–23)
CO2: 31 mEq/L (ref 19–32)
Calcium: 9.6 mg/dL (ref 8.4–10.5)
Chloride: 98 mEq/L (ref 96–112)
Creatinine, Ser: 0.76 mg/dL (ref 0.40–1.20)
GFR: 74.22 mL/min (ref >60.00)
Glucose, Bld: 118 mg/dL — ABNORMAL HIGH (ref 70–99)
Potassium: 3.6 mEq/L (ref 3.5–5.1)
Sodium: 138 mEq/L (ref 135–145)
Total Bilirubin: 1.1 mg/dL (ref 0.2–1.2)
Total Protein: 7.1 g/dL (ref 6.0–8.3)

## 2021-04-23 ENCOUNTER — Other Ambulatory Visit: Payer: Self-pay

## 2021-04-23 ENCOUNTER — Encounter: Payer: Self-pay | Admitting: Internal Medicine

## 2021-04-23 ENCOUNTER — Ambulatory Visit (INDEPENDENT_AMBULATORY_CARE_PROVIDER_SITE_OTHER): Payer: Medicare Other | Admitting: Internal Medicine

## 2021-04-23 VITALS — BP 138/76 | HR 60 | Temp 98.2°F | Ht 61.0 in | Wt 151.8 lb

## 2021-04-23 DIAGNOSIS — E1169 Type 2 diabetes mellitus with other specified complication: Secondary | ICD-10-CM

## 2021-04-23 DIAGNOSIS — E782 Mixed hyperlipidemia: Secondary | ICD-10-CM | POA: Diagnosis not present

## 2021-04-23 DIAGNOSIS — Z974 Presence of external hearing-aid: Secondary | ICD-10-CM

## 2021-04-23 DIAGNOSIS — I10 Essential (primary) hypertension: Secondary | ICD-10-CM | POA: Diagnosis not present

## 2021-04-23 DIAGNOSIS — I272 Pulmonary hypertension, unspecified: Secondary | ICD-10-CM | POA: Diagnosis not present

## 2021-04-23 MED ORDER — TIZANIDINE HCL 2 MG PO TABS: 2.0000 mg | ORAL_TABLET | Freq: Four times a day (QID) | ORAL | 0 refills | Status: DC | PRN

## 2021-04-23 NOTE — Patient Instructions (Addendum)
For your jaw pain and neck pain  :    Let's try using a muscle relaxer at night instead of lorazepam. Starting dose 2 mg daily . You can increase dose to 4 mg if needed. If that doesn't work,  go back to using the lorazepam and see if that works any better    Continue atorvastatin 10 mg daily   You do not need a medication for weight loss.  I want you  to resume walking for 30 minutes daily for your own health (both physical and emotional)   Please let Dr Edison Pace know that you have Type 2 Diabetes ,  diet controlled.    As long as your a1c is < 7.0  you will not need medications.

## 2021-04-23 NOTE — Progress Notes (Signed)
Subjective:  Patient ID: Kim Sanchez, female    DOB: 1941/05/04  Age: 80 y.o. MRN: [id number]  CC: The primary encounter diagnosis was DM type 2 with diabetic mixed hyperlipidemia ([place]). Diagnoses of Hearing aid worn, Essential hypertension, and Pulmonary hypertension, mild ([place]) were also pertinent to this visit.  HPI Kim Sanchez presents for  Chief Complaint  Patient presents with   Follow-up    6 month follow up on diabetes, hypertension and hyperlipidemia   This visit occurred during the SARS-CoV-2 public health emergency.  Safety protocols were in place, including screening questions prior to the visit, additional usage of staff PPE, and extensive cleaning of exam room while observing appropriate contact time as indicated for disinfecting solutions.   6 month follow up on Type 2 DM, rdiet controlled ,   Hypertension, hyperlipidemia      T2DM:  She  feels generally well,  But is not  exercising regularly or trying to lose weight. Checking  blood sugars less than once daily at variable times, usually only if she feels she may be having a hypoglycemic event. .  BS have been under 130 fasting and < 150 post prandially.  Denies any recent hypoglyemic events.  Taking   medications as directed. Following a carbohydrate modified diet 6 days per week. Denies numbness, burning and tingling of extremities. Appetite is good.  She has been keeping her carbohydrate content well under the recommended  45 carbs per meal .  Sees Dr Edison Pace annually for eye exans   Hypertension: patient checks blood pressure twice weekly at home.  Readings have been for the most part less than  140/80 at rest . 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  Wakes up with neck pain and jaw pain.  Wants to try a muscle relaxer,  advised not to use lorazepam at night  Wearing bilateral hearing aides for the past year  Outpatient  Medications Prior to Visit  Medication Sig Dispense Refill   aspirin EC 81 MG tablet Take 81 mg by mouth daily.     atorvastatin (LIPITOR) 10 MG tablet Take 1 tablet (10 mg total) by mouth every other day. 90 tablet 1   bisoprolol (ZEBETA) 5 MG tablet Take 1 tablet by mouth once daily 30 tablet 0   calcium carbonate (TUMS - DOSED IN MG ELEMENTAL CALCIUM) 500 MG chewable tablet Chew 1 tablet by mouth daily.     chlorthalidone (HYGROTON) 25 MG tablet Take 1 tablet by mouth once daily 90 tablet 0   furosemide (LASIX) 20 MG tablet TAKE 1 TABLET BY MOUTH ONCE DAILY AS NEEDED FOR FLUID RETENTION. 90 tablet 0   inFLIXimab (REMICADE) 100 MG injection Inject 1 mg into the vein every 8 (eight) weeks.     LORazepam (ATIVAN) 0.5 MG tablet TAKE 1 TABLET BY MOUTH EVERY 8 HOURS AS NEEDED FOR ANXIETY (MAX  OF  TWO  TABLETS  PER  DAY) 60 tablet 0   Multiple Vitamins-Minerals (MULTIVITAMIN ADULT PO) Take by mouth.     nystatin (MYCOSTATIN) 100000 UNIT/ML suspension   1   Omega-3 Fatty Acids (FISH OIL PO) Take by mouth.     polyethylene glycol powder (GLYCOLAX/MIRALAX) powder Take 17 g by mouth 2 (two) times daily as needed. 3350 g 11   No facility-administered medications prior to visit.    Review of Systems;  Patient denies headache, fevers, malaise,  unintentional weight loss, skin rash, eye pain, sinus congestion and sinus pain, sore throat, dysphagia,  hemoptysis , cough, dyspnea, wheezing, chest pain, palpitations, orthopnea, edema, abdominal pain, nausea, melena, diarrhea, constipation, flank pain, dysuria, hematuria, urinary  Frequency, nocturia, numbness, tingling, seizures,  Focal weakness, Loss of consciousness,  Tremor, insomnia, depression, anxiety, and suicidal ideation.      Objective:  BP 138/76 (BP Location: Left Arm, Patient Position: Sitting, Cuff Size: Normal)    Pulse 60    Temp 98.2 F (36.8 C) (Oral)    Ht 5' 1"  (1.549 m)    Wt 151 lb 12.8 oz (68.9 kg)    SpO2 95%    BMI 28.68 kg/m    BP Readings from Last 3 Encounters:  04/23/21 138/76  02/21/21 125/60  10/17/20 136/72    Wt Readings from Last 3 Encounters:  04/25/21 151 lb (68.5 kg)  04/23/21 151 lb 12.8 oz (68.9 kg)  02/21/21 151 lb (68.5 kg)    General appearance: alert, cooperative and appears stated age Ears: normal TM's and external ear canals both ears Throat: lips, mucosa, and tongue normal; teeth and gums normal Neck: no adenopathy, no carotid bruit, supple, symmetrical, trachea midline and thyroid not enlarged, symmetric, no tenderness/mass/nodules Back: symmetric, no curvature. ROM normal. No CVA tenderness. Lungs: clear to auscultation bilaterally Heart: regular rate and rhythm, S1, S2 normal, no murmur, click, rub or gallop Abdomen: soft, non-tender; bowel sounds normal; no masses,  no organomegaly Pulses: 2+ and symmetric Skin: Skin color, texture, turgor normal. No rashes or lesions Lymph nodes: Cervical, supraclavicular, and axillary nodes normal.  Lab Results  Component Value Date   HGBA1C 6.9 (H) 04/21/2021   HGBA1C 6.9 (H) 10/15/2020   HGBA1C 6.5 04/17/2020    Lab Results  Component Value Date   CREATININE 0.76 04/21/2021   CREATININE 0.80 10/15/2020   CREATININE 0.74 04/17/2020    Lab Results  Component Value Date   WBC 5.8 12/31/2016   HGB 13.5 12/31/2016   HCT 39.7 12/31/2016   PLT 215 12/31/2016   GLUCOSE 118 (H) 04/21/2021   CHOL 220 (H) 04/21/2021   TRIG 124.0 04/21/2021   HDL 67.40 04/21/2021   LDLDIRECT 151.5 10/03/2013   LDLCALC 127 (H) 04/21/2021   ALT 13 04/21/2021   AST 17 04/21/2021   NA 138 04/21/2021   K 3.6 04/21/2021   CL 98 04/21/2021   CREATININE 0.76 04/21/2021   BUN 16 04/21/2021   CO2 31 04/21/2021   TSH 2.39 09/30/2016   HGBA1C 6.9 (H) 04/21/2021   MICROALBUR <0.7 10/15/2020    MM 3D SCREEN BREAST BILATERAL  Result Date: 12/03/2020 CLINICAL DATA:  Screening. EXAM: DIGITAL SCREENING BILATERAL MAMMOGRAM WITH TOMOSYNTHESIS AND CAD  TECHNIQUE: Bilateral screening digital craniocaudal and mediolateral oblique mammograms were obtained. Bilateral screening digital breast tomosynthesis was performed. The images were evaluated with computer-aided detection. COMPARISON:  Previous exam(s). ACR Breast Density Category c: The breast tissue is heterogeneously dense, which may obscure small masses. FINDINGS: There are no findings suspicious for malignancy. IMPRESSION: No mammographic evidence of malignancy. A result letter of this screening mammogram will be mailed directly to the patient. RECOMMENDATION: Screening mammogram in one year. (Code:[id number]-B-01Y) BI-RADS CATEGORY  1: Negative. Electronically Signed   By: Ammie Ferrier M.D.   On: 12/03/2020 15:34   Assessment & Plan:   Problem List Items Addressed This Visit     Essential hypertension    Well controlled on current regimen of bisoprolol and chlorthalidone . Renal function stable,  no changes today.      DM type 2 with diabetic mixed hyperlipidemia ([place]) - Primary    Her diabetes remains well-controlled on diet alone. She is intolerant of ACE I and ARBs due to cough and  Constipation, respectively .  She has no proteinuria despite  Recurrent reports of "foamy urine.".  Foot exam is normal  .  Taking   atorvastatin every 3 days  Due to muscle pain with daily use,  And use of aspirin  once weekly due to nosebleeds .    Risk of CAD is 25%   Lab Results  Component Value Date   HGBA1C 6.9 (H) 04/21/2021   Lab Results  Component Value Date   MICROALBUR <0.7 10/15/2020   MICROALBUR 0.8 10/17/2019          Relevant Orders   Hemoglobin A1c   LDL cholesterol, direct   Lipid panel   Microalbumin / creatinine urine ratio   Comprehensive metabolic panel   Pulmonary hypertension, mild (H[facility])    Mild , suggested by [facility] showing TR and PR.  Last one Jan 2020 .  She is asymptomatic but leads a sedentary life.       Hearing aid worn    She has  Bilateral hearing aides         Meds ordered this encounter  Medications   tiZANidine (ZANAFLEX) 2 MG tablet    Sig: Take 1 tablet (2 mg total) by mouth every 6 (six) hours as needed for muscle spasms (broxism and neck pain).    Dispense:  30 tablet    Refill:  0     I provided  30 minutes of  face-to-face time during this encounter reviewing patient's current problems and past surgeries, labs and imaging studies, providing counseling on the above mentioned problems , and coordination  of care .   Follow-up: Return in about 6 months (around 10/22/2021) for follow up diabetes.   [name], MD

## 2021-04-25 ENCOUNTER — Ambulatory Visit (INDEPENDENT_AMBULATORY_CARE_PROVIDER_SITE_OTHER): Payer: Medicare Other

## 2021-04-25 VITALS — Ht 61.0 in | Wt 151.0 lb

## 2021-04-25 DIAGNOSIS — Z0000 Encounter for general adult medical examination without abnormal findings: Secondary | ICD-10-CM | POA: Diagnosis not present

## 2021-04-25 DIAGNOSIS — Z974 Presence of external hearing-aid: Secondary | ICD-10-CM | POA: Insufficient documentation

## 2021-04-25 NOTE — Patient Instructions (Signed)
[name] , Thank you for taking time to come for your Medicare Wellness Visit. I appreciate your ongoing commitment to your health goals. Please review the following plan we discussed and let me know if I can assist you in the future.   These are the goals we discussed:  Goals       Patient Stated     Increase physical activity (pt-stated)      Walk more for exercise Lose about 20lb Weight goal 130lb        This is a list of the screening recommended for you and due dates:  Health Maintenance  Topic Date Due   COVID-19 Vaccine (4 - Booster for Pfizer series) 05/09/2021*   Eye exam for diabetics  10/02/2021   Urine Protein Check  10/15/2021   Complete foot exam   10/17/2021   Hemoglobin A1C  10/20/2021   Tetanus Vaccine  07/24/2025   Pneumonia Vaccine  Completed   Flu Shot  Completed   DEXA scan (bone density measurement)  Completed   Zoster (Shingles) Vaccine  Completed   HPV Vaccine  Aged Out  *Topic was postponed. The date shown is not the original due date.    Advanced directives: on file  Conditions/risks identified: none new  Follow up in one year for your annual wellness visit    Preventive Care 65 Years and Older, Female Preventive care refers to lifestyle choices and visits with your health care provider that can promote health and wellness. What does preventive care include? A yearly physical exam. This is also called an annual well check. Dental exams once or twice a year. Routine eye exams. Ask your health care provider how often you should have your eyes checked. Personal lifestyle choices, including: Daily care of your teeth and gums. Regular physical activity. Eating a healthy diet. Avoiding tobacco and drug use. Limiting alcohol use. Practicing safe sex. Taking low-dose aspirin every day. Taking vitamin and mineral supplements as recommended by your health care provider. What happens during an annual well check? The services and screenings done  by your health care provider during your annual well check will depend on your age, overall health, lifestyle risk factors, and family history of disease. Counseling  Your health care provider may ask you questions about your: Alcohol use. Tobacco use. Drug use. Emotional well-being. Home and relationship well-being. Sexual activity. Eating habits. History of falls. Memory and ability to understand (cognition). Work and work Statistician. Reproductive health. Screening  You may have the following tests or measurements: Height, weight, and BMI. Blood pressure. Lipid and cholesterol levels. These may be checked every 5 years, or more frequently if you are over 30 years old. Skin check. Lung cancer screening. You may have this screening every year starting at age 23 if you have a 30-pack-year history of smoking and currently smoke or have quit within the past 15 years. Fecal occult blood test (FOBT) of the stool. You may have this test every year starting at age 59. Flexible sigmoidoscopy or colonoscopy. You may have a sigmoidoscopy every 5 years or a colonoscopy every 10 years starting at age 24. Hepatitis C blood test. Hepatitis B blood test. Sexually transmitted disease (STD) testing. Diabetes screening. This is done by checking your blood sugar (glucose) after you have not eaten for a while (fasting). You may have this done every 1-3 years. Bone density scan. This is done to screen for osteoporosis. You may have this done starting at age 7. Mammogram. This may  be done every 1-2 years. Talk to your health care provider about how often you should have regular mammograms. Talk with your health care provider about your test results, treatment options, and if necessary, the need for more tests. Vaccines  Your health care provider may recommend certain vaccines, such as: Influenza vaccine. This is recommended every year. Tetanus, diphtheria, and acellular pertussis (Tdap, Td) vaccine. You  may need a Td booster every 10 years. Zoster vaccine. You may need this after age 79. Pneumococcal 13-valent conjugate (PCV13) vaccine. One dose is recommended after age 45. Pneumococcal polysaccharide (PPSV23) vaccine. One dose is recommended after age 30. Talk to your health care provider about which screenings and vaccines you need and how often you need them. This information is not intended to replace advice given to you by your health care provider. Make sure you discuss any questions you have with your health care provider. Document Released: 05/17/2015 Document Revised: 01/08/2016 Document Reviewed: 02/19/2015 Elsevier Interactive Patient Education  2017 [place] Prevention in the Home Falls can cause injuries. They can happen to people of all ages. There are many things you can do to make your home safe and to help prevent falls. What can I do on the outside of my home? Regularly fix the edges of walkways and driveways and fix any cracks. Remove anything that might make you trip as you walk through a door, such as a raised step or threshold. Trim any bushes or trees on the path to your home. Use bright outdoor lighting. Clear any walking paths of anything that might make someone trip, such as rocks or tools. Regularly check to see if handrails are loose or broken. Make sure that both sides of any steps have handrails. Any raised decks and porches should have guardrails on the edges. Have any leaves, snow, or ice cleared regularly. Use sand or salt on walking paths during winter. Clean up any spills in your garage right away. This includes oil or grease spills. What can I do in the bathroom? Use night lights. Install grab bars by the toilet and in the tub and shower. Do not use towel bars as grab bars. Use non-skid mats or decals in the tub or shower. If you need to sit down in the shower, use a plastic, non-slip stool. Keep the floor dry. Clean up any water that spills  on the floor as soon as it happens. Remove soap buildup in the tub or shower regularly. Attach bath mats securely with double-sided non-slip rug tape. Do not have throw rugs and other things on the floor that can make you trip. What can I do in the bedroom? Use night lights. Make sure that you have a light by your bed that is easy to reach. Do not use any sheets or blankets that are too big for your bed. They should not hang down onto the floor. Have a firm chair that has side arms. You can use this for support while you get dressed. Do not have throw rugs and other things on the floor that can make you trip. What can I do in the kitchen? Clean up any spills right away. Avoid walking on wet floors. Keep items that you use a lot in easy-to-reach places. If you need to reach something above you, use a strong step stool that has a grab bar. Keep electrical cords out of the way. Do not use floor polish or wax that makes floors slippery. If you must use  wax, use non-skid floor wax. Do not have throw rugs and other things on the floor that can make you trip. What can I do with my stairs? Do not leave any items on the stairs. Make sure that there are handrails on both sides of the stairs and use them. Fix handrails that are broken or loose. Make sure that handrails are as long as the stairways. Check any carpeting to make sure that it is firmly attached to the stairs. Fix any carpet that is loose or worn. Avoid having throw rugs at the top or bottom of the stairs. If you do have throw rugs, attach them to the floor with carpet tape. Make sure that you have a light switch at the top of the stairs and the bottom of the stairs. If you do not have them, ask someone to add them for you. What else can I do to help prevent falls? Wear shoes that: Do not have high heels. Have rubber bottoms. Are comfortable and fit you well. Are closed at the toe. Do not wear sandals. If you use a stepladder: Make  sure that it is fully opened. Do not climb a closed stepladder. Make sure that both sides of the stepladder are locked into place. Ask someone to hold it for you, if possible. Clearly mark and make sure that you can see: Any grab bars or handrails. First and last steps. Where the edge of each step is. Use tools that help you move around (mobility aids) if they are needed. These include: Canes. Walkers. Scooters. Crutches. Turn on the lights when you go into a dark area. Replace any light bulbs as soon as they burn out. Set up your furniture so you have a clear path. Avoid moving your furniture around. If any of your floors are uneven, fix them. If there are any pets around you, be aware of where they are. Review your medicines with your doctor. Some medicines can make you feel dizzy. This can increase your chance of falling. Ask your doctor what other things that you can do to help prevent falls. This information is not intended to replace advice given to you by your health care provider. Make sure you discuss any questions you have with your health care provider. Document Released: 02/14/2009 Document Revised: 09/26/2015 Document Reviewed: 05/25/2014 Elsevier Interactive Patient Education  2017 [employer].

## 2021-04-25 NOTE — Assessment & Plan Note (Signed)
Well controlled on current regimen of bisoprolol and chlorthalidone . Renal function stable, no changes today.

## 2021-04-25 NOTE — Progress Notes (Signed)
Subjective:   Kim Sanchez is a 80 y.o. female who presents for Medicare Annual (Subsequent) preventive examination.  Review of Systems    No ROS.  Medicare Wellness Virtual Visit.  Visual/audio telehealth visit, UTA vital signs.   See social history for additional risk factors.   Cardiac Risk Factors include: advanced age (>61mn, >>85women);hypertension;diabetes mellitus     Objective:    Today's Vitals   04/25/21 0817  Weight: 151 lb (68.5 kg)  Height: 5' 1" (1.549 m)   Body mass index is 28.53 kg/m.  Advanced Directives 04/25/2021 04/24/2020 03/14/2020 04/20/2019 03/16/2019 04/15/2018 02/24/2018  Does Patient Have a Medical Advance Directive? _0  Yes Yes  Type of AParamedicof [place]Living will [place]Living will [place]ukesLiving will [place]ennardLiving will Living will;Healthcare Power of [place]Living will [place]Living will  Does patient want to make changes to medical advance directive? No - Patient declined No - Patient declined - No - Patient declined - No - Patient declined No - Patient declined  Copy of [place] Chart? Yes - validated most recent copy scanned in chart (See row information) Yes - validated most recent copy scanned in chart (See row information) - Yes - validated most recent copy scanned in chart (See row information) - Yes - validated most recent copy scanned in chart (See row information) No - copy requested  Would patient like information on creating a medical advance directive? - - - - - - -    Current Medications (verified) Outpatient Encounter Medications as of 04/25/2021  Medication Sig   aspirin EC 81 MG tablet Take 81 mg by mouth daily.   atorvastatin (LIPITOR) 10 MG tablet Take 1 tablet (10 mg total) by mouth every other day.   bisoprolol (ZEBETA) 5 MG tablet Take 1  tablet by mouth once daily   calcium carbonate (TUMS - DOSED IN MG ELEMENTAL CALCIUM) 500 MG chewable tablet Chew 1 tablet by mouth daily.   chlorthalidone (HYGROTON) 25 MG tablet Take 1 tablet by mouth once daily   furosemide (LASIX) 20 MG tablet TAKE 1 TABLET BY MOUTH ONCE DAILY AS NEEDED FOR FLUID RETENTION.   inFLIXimab (REMICADE) 100 MG injection Inject 1 mg into the vein every 8 (eight) weeks.   LORazepam (ATIVAN) 0.5 MG tablet TAKE 1 TABLET BY MOUTH EVERY 8 HOURS AS NEEDED FOR ANXIETY (MAX  OF  TWO  TABLETS  PER  DAY)   Multiple Vitamins-Minerals (MULTIVITAMIN ADULT PO) Take by mouth.   nystatin (MYCOSTATIN) 100000 UNIT/ML suspension    Omega-3 Fatty Acids (FISH OIL PO) Take by mouth.   polyethylene glycol powder (GLYCOLAX/MIRALAX) powder Take 17 g by mouth 2 (two) times daily as needed.   tiZANidine (ZANAFLEX) 2 MG tablet Take 1 tablet (2 mg total) by mouth every 6 (six) hours as needed for muscle spasms (broxism and neck pain).   [DISCONTINUED] losartan-hydrochlorothiazide (HYZAAR) 50-12.5 MG tablet Take 1 tablet by mouth daily.   No facility-administered encounter medications on file as of 04/25/2021.    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 1.6 cm node negative,  ER/PR positive, HER-2/neu not overexpressing tumor resected May 2014.   Crohn's disease ([place])    GERD (gastroesophageal reflux disease)    History of blood transfusion 1960   Hypertension    Personal history of radiation therapy    Past Surgical History:  Procedure Laterality Date   BRAIN SURGERY  1980   breast biopsies, benign   BREAST BIOPSY Right 2014    Invasive Mammory Carcinoma   BREAST BIOPSY Bilateral    - core Bx- neg   BREAST LUMPECTOMY Right May 2014   T1c, N0; ER/PR positive, HER-2/neu not overexpressing.   BREAST SURGERY  1980's   fibro cyst both breast in [place]   COLONOSCOPY  2017   DILATION AND CURETTAGE OF UTERUS     Family History  Problem Relation Age of Onset   Cancer Paternal Grandmother    Heart disease Sister    Diabetes Sister    Breast cancer Neg Hx    Social History   Socioeconomic History   Marital status: Married    Spouse name: Not on file   Number of children: Not on file   Years of education: Not on file   Highest education level: Not on file  Occupational History   Not on file  Tobacco Use   Smoking status: Never   Smokeless tobacco: Never  Vaping Use   Vaping Use: Never used  Substance and Sexual Activity   Alcohol use: Yes    Alcohol/week: 1.0 standard drink    Types: 1 Glasses of wine per week    Comment: OCC   Drug use: No   Sexual activity: Not Currently  Other Topics Concern   Not on file  Social History Narrative   Not on file   Social Determinants of Health   Financial Resource Strain: Low Risk    Difficulty of Paying Living Expenses: Not hard at all  Food Insecurity: No Food Insecurity   Worried About Charity fundraiser in the Last Year: Never true   Ran Out of Food in the Last Year: Never true  Transportation Needs: No Transportation Needs   Lack of Transportation (Medical): No   Lack of Transportation (Non-Medical): No  Physical Activity: Not on file  Stress: No Stress Concern Present   Feeling of Stress : Not at all  Social Connections: Unknown   Frequency of Communication with Friends and Family: More than three times a week   Frequency of Social Gatherings with Friends and Family: Not on file   Attends Religious Services: Not on Electrical engineer or Organizations: Not on file   Attends Archivist Meetings: Not on file   Marital Status:  Married    Tobacco Counseling Counseling given: Not Answered   Clinical Intake:  Pre-visit preparation completed: Yes        Diabetes: Yes (Followed by PCP)  How often do you need to have someone help you when you read instructions, pamphlets, or other written materials from your doctor or pharmacy?: 1 - Never    Interpreter Needed?: No      Activities of Daily Living In your present state of health, do you have any difficulty performing the following activities: 04/25/2021  Hearing? Y  Comment Hearing aids  Vision? N  Difficulty concentrating or making decisions? N  Walking or climbing stairs? N  Dressing or bathing? N  Doing errands, shopping? N  Preparing Food and eating ? N  Using the Toilet? N  In  the past six months, have you accidently leaked urine? N  Do you have problems with loss of bowel control? N  Managing your Medications? N  Managing your Finances? N  Housekeeping or managing your Housekeeping? N  Some recent data might be hidden    Patient Care Team: [name], MD as PCP - General (Internal Medicine) Bary Castilla, Forest Gleason, MD (General Surgery) [name], MD (Internal Medicine)  Indicate any recent Medical Services you may have received from other than [facility] providers in the past year (date may be approximate).     Assessment:   This is a routine wellness examination for [place].  Virtual Visit via Telephone Note  I connected with  [name] on 04/25/21 at  8:15 AM EST by telephone and verified that I am speaking with the correct person using two identifiers.  Persons participating in the virtual visit: patient/Nurse Health Advisor   I discussed the limitations, risks, security and privacy concerns of performing an evaluation and management service by telephone and the availability of in person appointments. The patient expressed understanding and agreed to proceed.  Interactive audio and video telecommunications were attempted  between this nurse and patient, however failed, due to patient having technical difficulties OR patient did not have access to video capability.  We continued and completed visit with audio only.  Some vital signs may be absent or patient reported.   Hearing/Vision screen Hearing Screening - Comments:: Hearing aid, bilateral  Vision Screening - Comments:: Followed by [facility]  Wears corrective lenses  They have regular follow up with the ophthalmologist  Dietary issues and exercise activities discussed: Current Exercise Habits: Home exercise routine, Type of exercise: walking, Intensity: Mild Regular diet Good water intake   Goals Addressed               This Visit's Progress     Patient Stated     Increase physical activity (pt-stated)        Walk more for exercise Lose about 20lb Weight goal 130lb       Depression Screen PHQ 2/9 Scores 04/25/2021 04/23/2021 10/17/2020 04/24/2020 04/20/2019 04/15/2018 04/14/2017  PHQ - 2 Score 0 0 0 0 0 0 0  PHQ- 9 Score - - 6 - - - -    Fall Risk Fall Risk  04/25/2021 04/23/2021 02/21/2021 10/17/2020 04/24/2020  Falls in the past year? 0 0 0 0 1  Comment - - - - -  Number falls in past yr: 0 - 0 - 0  Injury with Fall? - - 0 - 0  Risk for fall due to : - No Fall Risks No Fall Risks - -  Follow up _0     FALL RISK PREVENTION PERTAINING TO THE HOME: Home free of loose throw rugs in walkways, pet beds, electrical cords, etc? Yes  Adequate lighting in your home to reduce risk of falls? Yes   ASSISTIVE DEVICES UTILIZED TO PREVENT FALLS: Life alert? No  Use of a cane, walker or w/c? No  Grab bars in the bathroom? Yes  Shower chair or bench in shower? Yes  Elevated toilet seat or a handicapped toilet? Yes   TIMED UP AND GO: Was the test performed? No .   Cognitive Function: Patient is  alert and oriented x3.  MMSE - Mini Mental State Exam 04/14/2017 04/14/2016  Orientation to time 5 5  Orientation to Place  5 5  Registration 3 3  Attention/ Calculation 5 5  Recall 3 3  Language- name 2 objects 2 2  Language- repeat 1 1  Language- follow 3 step command 3 3  Language- read & follow direction 1 1  Write a sentence 1 1  Copy design 1 1  Total score 30 30     6CIT Screen 04/20/2019 04/15/2018  What Year? 0 points 0 points  What month? 0 points 0 points  What time? 0 points 0 points  Count back from 20 0 points 0 points  Months in reverse 0 points 0 points  Repeat phrase - 0 points  Total Score - 0    Immunizations Immunization History  Administered Date(s) Administered   Fluad Quad(high Dose 65+) 01/19/2020   Influenza Split 02/01/2013   Influenza Whole 01/17/2012   Influenza, High Dose Seasonal PF 02/02/2016, 01/13/2017, 02/04/2018, 02/06/2019, 03/03/2021   Influenza,inj,Quad PF,6+ Mos 03/20/2015   Influenza-Unspecified 03/05/2014   PFIZER(Purple Top)SARS-COV-2 Vaccination 05/15/2019, 06/05/2019, 02/13/2020   Pneumococcal Conjugate-13 07/24/2014, 10/06/2017   Pneumococcal Polysaccharide-23 12/16/2012   Td 03/19/2006   Tdap 07/25/2015   Zoster Recombinat (Shingrix) 07/04/2018, 11/01/2018   Screening Tests Health Maintenance  Topic Date Due   COVID-19 Vaccine (4 - Booster for Pfizer series) 05/09/2021 (Originally 04/09/2020)   OPHTHALMOLOGY EXAM  10/02/2021   URINE MICROALBUMIN  10/15/2021   FOOT EXAM  10/17/2021   HEMOGLOBIN A1C  10/20/2021   TETANUS/TDAP  07/24/2025   Pneumonia Vaccine 80+ Years old  Completed   INFLUENZA VACCINE  Completed   DEXA SCAN  Completed   Zoster Vaccines- Shingrix  Completed   HPV VACCINES  Aged Out   Health Maintenance There are no preventive care reminders to display for this patient.  Lung Cancer Screening: (Low Dose CT Chest recommended if Age 59-80 years, 30 pack-year currently smoking OR have quit w/in 15years.)  does not qualify.   Vision Screening: Recommended annual ophthalmology exams for early detection of glaucoma and other disorders of the eye.  Dental Screening: Recommended annual dental exams for proper oral hygiene  Community Resource Referral / Chronic Care Management: CRR required this visit?  No   CCM required this visit?  No      Plan:   Keep all routine maintenance appointments.   I have personally reviewed and noted the following in the patients chart:   Medical and social history Use of alcohol, tobacco or illicit drugs  Current medications and supplements including opioid prescriptions. Not taking opioid.  Functional ability and status Nutritional status Physical activity Advanced directives List of other physicians Hospitalizations, surgeries, and ER visits in previous 12 months Vitals Screenings to include cognitive, depression, and falls Referrals and appointments  In addition, I have reviewed and discussed with patient certain preventive protocols, quality metrics, and best practice recommendations. A written personalized care plan for preventive services as well as general preventive health recommendations were provided to patient.     Varney Biles, LPN   74/12/1446

## 2021-04-25 NOTE — Assessment & Plan Note (Signed)
Mild , suggested by [facility] showing TR and PR.  Last one Jan 2020 .  She is asymptomatic but leads a sedentary life.

## 2021-04-25 NOTE — Assessment & Plan Note (Signed)
Her diabetes remains well-controlled on diet alone. She is intolerant of ACE I and ARBs due to cough and  Constipation, respectively .  She has no proteinuria despite  Recurrent reports of "foamy urine.".  Foot exam is normal  .  Taking   atorvastatin every 3 days  Due to muscle pain with daily use,  And use of aspirin  once weekly due to nosebleeds .    Risk of CAD is 25%   Lab Results  Component Value Date   HGBA1C 6.9 (H) 04/21/2021   Lab Results  Component Value Date   MICROALBUR <0.7 10/15/2020   MICROALBUR 0.8 10/17/2019

## 2021-04-25 NOTE — Assessment & Plan Note (Signed)
She has  Bilateral hearing aides

## 2021-05-08 ENCOUNTER — Encounter: Payer: Self-pay | Admitting: Internal Medicine

## 2021-05-09 NOTE — Telephone Encounter (Signed)
I have contacted the patient given her the information she needs to get this setup at the [facility] infusion center on [address] and I spoken to the RN clinical supervisor to confirm they will take care of prior authorizations and everything contacting patient and setting up infusion once they have approval.

## 2021-05-21 DIAGNOSIS — K50919 Crohn's disease, unspecified, with unspecified complications: Secondary | ICD-10-CM | POA: Diagnosis not present

## 2021-06-05 ENCOUNTER — Other Ambulatory Visit: Payer: Self-pay | Admitting: Internal Medicine

## 2021-06-13 ENCOUNTER — Ambulatory Visit (LOCAL_COMMUNITY_HEALTH_CENTER): Payer: Self-pay

## 2021-06-13 ENCOUNTER — Other Ambulatory Visit: Payer: Self-pay

## 2021-06-13 DIAGNOSIS — Z111 Encounter for screening for respiratory tuberculosis: Secondary | ICD-10-CM

## 2021-06-16 ENCOUNTER — Ambulatory Visit (LOCAL_COMMUNITY_HEALTH_CENTER): Payer: Self-pay

## 2021-06-16 ENCOUNTER — Other Ambulatory Visit: Payer: Self-pay

## 2021-06-16 DIAGNOSIS — Z111 Encounter for screening for respiratory tuberculosis: Secondary | ICD-10-CM

## 2021-06-16 LAB — TB SKIN TEST
Induration: 0 mm
TB Skin Test: NEGATIVE

## 2021-06-18 DIAGNOSIS — D2262 Melanocytic nevi of left upper limb, including shoulder: Secondary | ICD-10-CM | POA: Diagnosis not present

## 2021-06-18 DIAGNOSIS — L814 Other melanin hyperpigmentation: Secondary | ICD-10-CM | POA: Diagnosis not present

## 2021-06-18 DIAGNOSIS — D2261 Melanocytic nevi of right upper limb, including shoulder: Secondary | ICD-10-CM | POA: Diagnosis not present

## 2021-06-18 DIAGNOSIS — D225 Melanocytic nevi of trunk: Secondary | ICD-10-CM | POA: Diagnosis not present

## 2021-06-18 DIAGNOSIS — L821 Other seborrheic keratosis: Secondary | ICD-10-CM | POA: Diagnosis not present

## 2021-06-18 DIAGNOSIS — L538 Other specified erythematous conditions: Secondary | ICD-10-CM | POA: Diagnosis not present

## 2021-06-18 DIAGNOSIS — D2272 Melanocytic nevi of left lower limb, including hip: Secondary | ICD-10-CM | POA: Diagnosis not present

## 2021-06-18 DIAGNOSIS — L918 Other hypertrophic disorders of the skin: Secondary | ICD-10-CM | POA: Diagnosis not present

## 2021-07-04 ENCOUNTER — Other Ambulatory Visit: Payer: Self-pay | Admitting: Internal Medicine

## 2021-07-10 ENCOUNTER — Other Ambulatory Visit: Payer: Self-pay | Admitting: Internal Medicine

## 2021-07-10 DIAGNOSIS — I10 Essential (primary) hypertension: Secondary | ICD-10-CM | POA: Diagnosis not present

## 2021-07-10 DIAGNOSIS — I272 Pulmonary hypertension, unspecified: Secondary | ICD-10-CM | POA: Diagnosis not present

## 2021-07-10 DIAGNOSIS — R0789 Other chest pain: Secondary | ICD-10-CM | POA: Diagnosis not present

## 2021-07-10 DIAGNOSIS — R0602 Shortness of breath: Secondary | ICD-10-CM | POA: Diagnosis not present

## 2021-07-10 DIAGNOSIS — R002 Palpitations: Secondary | ICD-10-CM | POA: Diagnosis not present

## 2021-07-14 ENCOUNTER — Encounter: Payer: Self-pay | Admitting: Internal Medicine

## 2021-07-15 DIAGNOSIS — K5080 Crohn's disease of both small and large intestine without complications: Secondary | ICD-10-CM | POA: Diagnosis not present

## 2021-07-15 DIAGNOSIS — K5900 Constipation, unspecified: Secondary | ICD-10-CM | POA: Diagnosis not present

## 2021-07-15 DIAGNOSIS — M50322 Other cervical disc degeneration at C5-C6 level: Secondary | ICD-10-CM | POA: Diagnosis not present

## 2021-07-15 DIAGNOSIS — M542 Cervicalgia: Secondary | ICD-10-CM | POA: Diagnosis not present

## 2021-07-15 DIAGNOSIS — M5412 Radiculopathy, cervical region: Secondary | ICD-10-CM | POA: Diagnosis not present

## 2021-07-15 DIAGNOSIS — M9901 Segmental and somatic dysfunction of cervical region: Secondary | ICD-10-CM | POA: Diagnosis not present

## 2021-07-15 DIAGNOSIS — M5413 Radiculopathy, cervicothoracic region: Secondary | ICD-10-CM | POA: Diagnosis not present

## 2021-07-15 DIAGNOSIS — R5381 Other malaise: Secondary | ICD-10-CM | POA: Diagnosis not present

## 2021-07-18 DIAGNOSIS — K50919 Crohn's disease, unspecified, with unspecified complications: Secondary | ICD-10-CM | POA: Diagnosis not present

## 2021-07-18 DIAGNOSIS — Z5112 Encounter for antineoplastic immunotherapy: Secondary | ICD-10-CM | POA: Diagnosis not present

## 2021-07-22 DIAGNOSIS — M5413 Radiculopathy, cervicothoracic region: Secondary | ICD-10-CM | POA: Diagnosis not present

## 2021-07-22 DIAGNOSIS — M25512 Pain in left shoulder: Secondary | ICD-10-CM | POA: Diagnosis not present

## 2021-07-22 DIAGNOSIS — M9901 Segmental and somatic dysfunction of cervical region: Secondary | ICD-10-CM | POA: Diagnosis not present

## 2021-07-22 DIAGNOSIS — M5412 Radiculopathy, cervical region: Secondary | ICD-10-CM | POA: Diagnosis not present

## 2021-07-22 DIAGNOSIS — M7918 Myalgia, other site: Secondary | ICD-10-CM | POA: Diagnosis not present

## 2021-07-22 DIAGNOSIS — M542 Cervicalgia: Secondary | ICD-10-CM | POA: Diagnosis not present

## 2021-07-22 DIAGNOSIS — M50322 Other cervical disc degeneration at C5-C6 level: Secondary | ICD-10-CM | POA: Diagnosis not present

## 2021-07-22 DIAGNOSIS — M50321 Other cervical disc degeneration at C4-C5 level: Secondary | ICD-10-CM | POA: Diagnosis not present

## 2021-07-23 ENCOUNTER — Encounter: Payer: Self-pay | Admitting: Internal Medicine

## 2021-07-23 ENCOUNTER — Ambulatory Visit (INDEPENDENT_AMBULATORY_CARE_PROVIDER_SITE_OTHER): Payer: Medicare HMO | Admitting: Internal Medicine

## 2021-07-23 ENCOUNTER — Other Ambulatory Visit: Payer: Self-pay

## 2021-07-23 VITALS — BP 146/78 | HR 63 | Temp 97.5°F | Ht 61.0 in | Wt 142.8 lb

## 2021-07-23 DIAGNOSIS — R5383 Other fatigue: Secondary | ICD-10-CM | POA: Diagnosis not present

## 2021-07-23 DIAGNOSIS — R748 Abnormal levels of other serum enzymes: Secondary | ICD-10-CM | POA: Diagnosis not present

## 2021-07-23 DIAGNOSIS — Z1231 Encounter for screening mammogram for malignant neoplasm of breast: Secondary | ICD-10-CM | POA: Diagnosis not present

## 2021-07-23 DIAGNOSIS — I10 Essential (primary) hypertension: Secondary | ICD-10-CM | POA: Diagnosis not present

## 2021-07-23 DIAGNOSIS — M8580 Other specified disorders of bone density and structure, unspecified site: Secondary | ICD-10-CM | POA: Diagnosis not present

## 2021-07-23 DIAGNOSIS — Z780 Asymptomatic menopausal state: Secondary | ICD-10-CM | POA: Diagnosis not present

## 2021-07-23 DIAGNOSIS — Z1239 Encounter for other screening for malignant neoplasm of breast: Secondary | ICD-10-CM | POA: Diagnosis not present

## 2021-07-23 DIAGNOSIS — E1169 Type 2 diabetes mellitus with other specified complication: Secondary | ICD-10-CM | POA: Diagnosis not present

## 2021-07-23 DIAGNOSIS — E782 Mixed hyperlipidemia: Secondary | ICD-10-CM | POA: Diagnosis not present

## 2021-07-23 LAB — COMPREHENSIVE METABOLIC PANEL
ALT: 19 U/L (ref 0–35)
AST: 24 U/L (ref 0–37)
Albumin: 4 g/dL (ref 3.5–5.2)
Alkaline Phosphatase: 292 U/L — ABNORMAL HIGH (ref 39–117)
BUN: 13 mg/dL (ref 6–23)
CO2: 27 mEq/L (ref 19–32)
Calcium: 9.4 mg/dL (ref 8.4–10.5)
Chloride: 97 mEq/L (ref 96–112)
Creatinine, Ser: 0.74 mg/dL (ref 0.40–1.20)
GFR: 76.5 mL/min (ref >60.00)
Glucose, Bld: 116 mg/dL — ABNORMAL HIGH (ref 70–99)
Potassium: 3.9 mEq/L (ref 3.5–5.1)
Sodium: 133 mEq/L — ABNORMAL LOW (ref 135–145)
Total Bilirubin: 0.9 mg/dL (ref 0.2–1.2)
Total Protein: 7.4 g/dL (ref 6.0–8.3)

## 2021-07-23 LAB — LIPID PANEL
Cholesterol: 243 mg/dL — ABNORMAL HIGH (ref 0–200)
HDL: 66.2 mg/dL (ref >39.00)
LDL Cholesterol: 147 mg/dL — ABNORMAL HIGH (ref 0–99)
NonHDL: 176.59
Total CHOL/HDL Ratio: 4
Triglycerides: 146 mg/dL (ref 0.0–149.0)
VLDL: 29.2 mg/dL (ref 0.0–40.0)

## 2021-07-23 LAB — GAMMA GT: GGT: 24 U/L (ref 7–51)

## 2021-07-23 LAB — TSH: TSH: 2.29 u[IU]/mL (ref 0.35–5.50)

## 2021-07-23 LAB — HEMOGLOBIN A1C: Hgb A1c MFr Bld: 7.4 % — ABNORMAL HIGH (ref 4.6–6.5)

## 2021-07-23 NOTE — Assessment & Plan Note (Signed)
Now taking 1.2 doses of chlorthalidone and bisoprolol per Dr Ubaldo Glassing,  Advised to resume full dose of chlorthalidone for SBP  > 150 ?

## 2021-07-23 NOTE — Patient Instructions (Addendum)
Your annual mammogram AND  DEXA  SCAN have been ordered.  Please call [place] to call to make your appointments  .  The phone number for [place] [facility] is  [phone]   ? ?I WILL FAX ANY RESULTS OF YOUR LIVER TESTS AND/OR ULTRASOUND TO YOUR GI DOCTOR  ? ? ?IF YOUR BP READINGS START TO STAY ABOVE 374 SYSTOLIC,  RESUME THE HIGHER DOSE OF CHLORTHALIDONE  ?

## 2021-07-23 NOTE — Progress Notes (Signed)
? ?Subjective:  ?Patient ID: Kim Sanchez, female    DOB: 06/23/1940  Age: 81 y.o. MRN: [id number] ? ?CC: The primary encounter diagnosis was Other fatigue. Diagnoses of DM type 2 with diabetic mixed hyperlipidemia ([place]), Elevated alkaline phosphatase level, Osteopenia after menopause, Encounter for breast cancer screening other than mammogram, Breast cancer screening by mammogram, Essential hypertension, Caregiver with fatigue, and Osteopenia, unspecified location were also pertinent to this visit. ? ? ?This visit occurred during the SARS-CoV-2 public health emergency.  Safety protocols were in place, including screening questions prior to the visit, additional usage of staff PPE, and extensive cleaning of exam room while observing appropriate contact time as indicated for disinfecting solutions.   ? ?HPI ?Kim Sanchez presents for evaluation of fatigue ? ?Chief Complaint  ?Patient presents with  ? Fatigue  ?  Pt would like to have her thyroid levels checked  ? ?Not sleeping well.  Melatonin not tolerated.  Thinking of trying sleepy time tea.  Evening dietary and activity  routine reviewed,  cares for sickly husband,  then reads material that makes her concerned and worried.  Not exercising daily.  Caffeine intake low and none after 1 pm.  ? ?Crohn's Disease : managed with remicaide prescribed by  her gastroenterologist in [place].  Requestin  I review the labs she had done by him from March 14.  Alk pos elevated. Marland Kitchen  No plan for follow up per patient until 6 months . Denies nausea,  RUQ pain,  changes in urine.  ? ?[name] on March 9.  ECHO planned.  Bp readings were 794 systolic so he reduced chlothalidone and bisoprolol with improvement in systolic readings.  ? ? ?Outpatient Medications Prior to Visit  ?Medication Sig Dispense Refill  ? aspirin EC 81 MG tablet Take 81 mg by mouth daily.    ? atorvastatin (LIPITOR) 10 MG tablet Take 1 tablet (10 mg total) by mouth every other day. 90 tablet 1  ? bisoprolol  (ZEBETA) 5 MG tablet Take 1 tablet by mouth once daily 30 tablet 0  ? calcium carbonate (TUMS - DOSED IN MG ELEMENTAL CALCIUM) 500 MG chewable tablet Chew 1 tablet by mouth daily.    ? chlorthalidone (HYGROTON) 25 MG tablet Take 1 tablet by mouth once daily 90 tablet 0  ? furosemide (LASIX) 20 MG tablet TAKE 1 TABLET BY MOUTH ONCE DAILY AS NEEDED FOR  FLUID  RETENTION 90 tablet 0  ? inFLIXimab (REMICADE) 100 MG injection Inject 1 mg into the vein every 8 (eight) weeks.    ? LORazepam (ATIVAN) 0.5 MG tablet TAKE 1 TABLET BY MOUTH EVERY 8 HOURS AS NEEDED FOR ANXIETY (MAX OF 2 TABLETS PER DAY) 60 tablet 0  ? Multiple Vitamins-Minerals (MULTIVITAMIN ADULT PO) Take by mouth.    ? nystatin (MYCOSTATIN) 100000 UNIT/ML suspension   1  ? Omega-3 Fatty Acids (FISH OIL PO) Take by mouth.    ? polyethylene glycol powder (GLYCOLAX/MIRALAX) powder Take 17 g by mouth 2 (two) times daily as needed. 3350 g 11  ? tiZANidine (ZANAFLEX) 2 MG tablet Take 1 tablet (2 mg total) by mouth every 6 (six) hours as needed for muscle spasms (broxism and neck pain). (Patient not taking: Reported on 07/23/2021) 30 tablet 0  ? ?No facility-administered medications prior to visit.  ? ? ?Review of Systems; ? ?Patient denies headache, fevers, malaise, unintentional weight loss, skin rash, eye pain, sinus congestion and sinus pain, sore throat, dysphagia,  hemoptysis , cough, dyspnea, wheezing,  chest pain, palpitations, orthopnea, edema, abdominal pain, nausea, melena, diarrhea, constipation, flank pain, dysuria, hematuria, urinary  Frequency, nocturia, numbness, tingling, seizures,  Focal weakness, Loss of consciousness,  Tremor, insomnia, depression, anxiety, and suicidal ideation.   ? ? ? ?Objective:  ?BP (!) 146/78 (BP Location: Left Arm, Patient Position: Sitting, Cuff Size: Normal)   Pulse 63   Temp (!) 97.5 ?F (36.4 ?C) (Oral)   Ht _0  (1.549 m)   Wt 142 lb 12.8 oz (64.8 kg)   SpO2 94%   BMI 26.98 kg/m?  ? ?BP Readings from Last 3  Encounters:  ?07/23/21 (!) 146/78  ?04/23/21 138/76  ?02/21/21 125/60  ? ? ?Wt Readings from Last 3 Encounters:  ?07/23/21 142 lb 12.8 oz (64.8 kg)  ?04/25/21 151 lb (68.5 kg)  ?04/23/21 151 lb 12.8 oz (68.9 kg)  ? ? ?General appearance: alert, cooperative and appears stated age ?Ears: normal TM's and external ear canals both ears ?Throat: lips, mucosa, and tongue normal; teeth and gums normal ?Neck: no adenopathy, no carotid bruit, supple, symmetrical, trachea midline and thyroid not enlarged, symmetric, no tenderness/mass/nodules ?Back: symmetric, no curvature. ROM normal. No CVA tenderness. ?Lungs: clear to auscultation bilaterally ?Heart: regular rate and rhythm, S1, S2 normal, no murmur, click, rub or gallop ?Abdomen: soft, non-tender; bowel sounds normal; no masses,  no organomegaly ?Pulses: 2+ and symmetric ?Skin: Skin color, texture, turgor normal. No rashes or lesions ?Lymph nodes: Cervical, supraclavicular, and axillary nodes normal. ? ?Lab Results  ?Component Value Date  ? HGBA1C 6.9 (H) 04/21/2021  ? HGBA1C 6.9 (H) 10/15/2020  ? HGBA1C 6.5 04/17/2020  ? ? ?Lab Results  ?Component Value Date  ? CREATININE 0.76 04/21/2021  ? CREATININE 0.80 10/15/2020  ? CREATININE 0.74 04/17/2020  ? ? ?Lab Results  ?Component Value Date  ? WBC 5.8 12/31/2016  ? HGB 13.5 12/31/2016  ? HCT 39.7 12/31/2016  ? PLT 215 12/31/2016  ? GLUCOSE 118 (H) 04/21/2021  ? CHOL 220 (H) 04/21/2021  ? TRIG 124.0 04/21/2021  ? HDL 67.40 04/21/2021  ? LDLDIRECT 151.5 10/03/2013  ? LDLCALC 127 (H) 04/21/2021  ? ALT 13 04/21/2021  ? AST 17 04/21/2021  ? NA 138 04/21/2021  ? K 3.6 04/21/2021  ? CL 98 04/21/2021  ? CREATININE 0.76 04/21/2021  ? BUN 16 04/21/2021  ? CO2 31 04/21/2021  ? TSH 2.39 09/30/2016  ? HGBA1C 6.9 (H) 04/21/2021  ? MICROALBUR <0.7 10/15/2020  ? ? ?MM 3D SCREEN BREAST BILATERAL ? ?Result Date: 12/03/2020 ?CLINICAL DATA:  Screening. EXAM: DIGITAL SCREENING BILATERAL MAMMOGRAM WITH TOMOSYNTHESIS AND CAD TECHNIQUE: Bilateral  screening digital craniocaudal and mediolateral oblique mammograms were obtained. Bilateral screening digital breast tomosynthesis was performed. The images were evaluated with computer-aided detection. COMPARISON:  Previous exam(s). ACR Breast Density Category c: The breast tissue is heterogeneously dense, which may obscure small masses. FINDINGS: There are no findings suspicious for malignancy. IMPRESSION: No mammographic evidence of malignancy. A result letter of this screening mammogram will be mailed directly to the patient. RECOMMENDATION: Screening mammogram in one year. (Code:SM-B-01Y) BI-RADS CATEGORY  1: Negative. Electronically Signed   By: Ammie Ferrier M.D.   On: 12/03/2020 15:34 ? ? ?Assessment & Plan:  ? ?Problem List Items Addressed This Visit   ? ? Caregiver with fatigue - Primary  ?  She is not anemic and renal function is normal. BP no longer low.  Fatigue did not improve with reduction in bisoprolol dose.    Checking thyroid.  Encouraged increased activity/exercise ?  ?  ?  DM type 2 with diabetic mixed hyperlipidemia ([place])  ? Relevant Orders  ? Hemoglobin A1c  ? Lipid panel  ? Elevated alkaline phosphatase level  ?  First occurrence,  Reviewed all prior normal labs.  Repeating today , with GGT ?  ?  ? Relevant Orders  ? Gamma GT  ? Essential hypertension  ?  Now taking 1.2 doses of chlorthalidone and bisoprolol per Dr Ubaldo Glassing,  Advised to resume full dose of chlorthalidone for SBP  > 150 ?  ?  ? Osteopenia  ?  Repeat DEXA this Fall (3 yr follow up) ?  ?  ? ?Other Visit Diagnoses   ? ? Osteopenia after menopause      ? Relevant Orders  ? DG Bone Density  ? Encounter for breast cancer screening other than mammogram      ? Breast cancer screening by mammogram      ? Relevant Orders  ? MM 3D SCREEN BREAST BILATERAL  ? ?  ? ? ?I spent 30 minutes dedicated to the care of this patient on the date of this encounter to include pre-visit review of patient's medical history,  most recent imaging studies,  Face-to-face time with the patient , and post visit ordering of testing and therapeutics.   ? ?Follow-up: Return in about 6 months (around 01/23/2022). ? ? ?[name], MD ?

## 2021-07-23 NOTE — Assessment & Plan Note (Signed)
First occurrence,  Reviewed all prior normal labs.  Repeating today , with GGT ?

## 2021-07-23 NOTE — Assessment & Plan Note (Signed)
She is not anemic and renal function is normal. BP no longer low.  Fatigue did not improve with reduction in bisoprolol dose.    Checking thyroid.  Encouraged increased activity/exercise ?

## 2021-07-23 NOTE — Assessment & Plan Note (Signed)
Repeat DEXA this Fall (3 yr follow up) ?

## 2021-07-24 DIAGNOSIS — M7918 Myalgia, other site: Secondary | ICD-10-CM | POA: Diagnosis not present

## 2021-07-24 DIAGNOSIS — M542 Cervicalgia: Secondary | ICD-10-CM | POA: Diagnosis not present

## 2021-07-24 DIAGNOSIS — M5413 Radiculopathy, cervicothoracic region: Secondary | ICD-10-CM | POA: Diagnosis not present

## 2021-07-24 DIAGNOSIS — M5412 Radiculopathy, cervical region: Secondary | ICD-10-CM | POA: Diagnosis not present

## 2021-07-24 DIAGNOSIS — M25512 Pain in left shoulder: Secondary | ICD-10-CM | POA: Diagnosis not present

## 2021-07-24 DIAGNOSIS — M50321 Other cervical disc degeneration at C4-C5 level: Secondary | ICD-10-CM | POA: Diagnosis not present

## 2021-07-24 DIAGNOSIS — M9901 Segmental and somatic dysfunction of cervical region: Secondary | ICD-10-CM | POA: Diagnosis not present

## 2021-07-25 NOTE — Addendum Note (Signed)
Addended by: [name] on: 07/25/2021 10:36 AM ? ? Modules accepted: Orders ? ?

## 2021-07-28 DIAGNOSIS — M5412 Radiculopathy, cervical region: Secondary | ICD-10-CM | POA: Diagnosis not present

## 2021-07-28 DIAGNOSIS — M25512 Pain in left shoulder: Secondary | ICD-10-CM | POA: Diagnosis not present

## 2021-07-28 DIAGNOSIS — M9901 Segmental and somatic dysfunction of cervical region: Secondary | ICD-10-CM | POA: Diagnosis not present

## 2021-07-28 DIAGNOSIS — M5413 Radiculopathy, cervicothoracic region: Secondary | ICD-10-CM | POA: Diagnosis not present

## 2021-07-28 DIAGNOSIS — M7918 Myalgia, other site: Secondary | ICD-10-CM | POA: Diagnosis not present

## 2021-07-28 DIAGNOSIS — M542 Cervicalgia: Secondary | ICD-10-CM | POA: Diagnosis not present

## 2021-07-29 DIAGNOSIS — R0602 Shortness of breath: Secondary | ICD-10-CM | POA: Diagnosis not present

## 2021-07-31 DIAGNOSIS — M542 Cervicalgia: Secondary | ICD-10-CM | POA: Diagnosis not present

## 2021-07-31 DIAGNOSIS — M50321 Other cervical disc degeneration at C4-C5 level: Secondary | ICD-10-CM | POA: Diagnosis not present

## 2021-07-31 DIAGNOSIS — M25512 Pain in left shoulder: Secondary | ICD-10-CM | POA: Diagnosis not present

## 2021-07-31 DIAGNOSIS — M5413 Radiculopathy, cervicothoracic region: Secondary | ICD-10-CM | POA: Diagnosis not present

## 2021-07-31 DIAGNOSIS — M9901 Segmental and somatic dysfunction of cervical region: Secondary | ICD-10-CM | POA: Diagnosis not present

## 2021-07-31 DIAGNOSIS — M5412 Radiculopathy, cervical region: Secondary | ICD-10-CM | POA: Diagnosis not present

## 2021-07-31 DIAGNOSIS — M7918 Myalgia, other site: Secondary | ICD-10-CM | POA: Diagnosis not present

## 2021-07-31 DIAGNOSIS — M50322 Other cervical disc degeneration at C5-C6 level: Secondary | ICD-10-CM | POA: Diagnosis not present

## 2021-08-06 DIAGNOSIS — M5413 Radiculopathy, cervicothoracic region: Secondary | ICD-10-CM | POA: Diagnosis not present

## 2021-08-06 DIAGNOSIS — M5412 Radiculopathy, cervical region: Secondary | ICD-10-CM | POA: Diagnosis not present

## 2021-08-06 DIAGNOSIS — M542 Cervicalgia: Secondary | ICD-10-CM | POA: Diagnosis not present

## 2021-08-06 DIAGNOSIS — M9901 Segmental and somatic dysfunction of cervical region: Secondary | ICD-10-CM | POA: Diagnosis not present

## 2021-08-06 DIAGNOSIS — M25512 Pain in left shoulder: Secondary | ICD-10-CM | POA: Diagnosis not present

## 2021-08-06 DIAGNOSIS — M50322 Other cervical disc degeneration at C5-C6 level: Secondary | ICD-10-CM | POA: Diagnosis not present

## 2021-08-06 DIAGNOSIS — M7918 Myalgia, other site: Secondary | ICD-10-CM | POA: Diagnosis not present

## 2021-08-06 DIAGNOSIS — M50321 Other cervical disc degeneration at C4-C5 level: Secondary | ICD-10-CM | POA: Diagnosis not present

## 2021-08-09 ENCOUNTER — Other Ambulatory Visit: Payer: Self-pay | Admitting: Internal Medicine

## 2021-08-11 DIAGNOSIS — M50322 Other cervical disc degeneration at C5-C6 level: Secondary | ICD-10-CM | POA: Diagnosis not present

## 2021-08-11 DIAGNOSIS — M7918 Myalgia, other site: Secondary | ICD-10-CM | POA: Diagnosis not present

## 2021-08-11 DIAGNOSIS — M542 Cervicalgia: Secondary | ICD-10-CM | POA: Diagnosis not present

## 2021-08-11 DIAGNOSIS — M25512 Pain in left shoulder: Secondary | ICD-10-CM | POA: Diagnosis not present

## 2021-08-11 DIAGNOSIS — M5412 Radiculopathy, cervical region: Secondary | ICD-10-CM | POA: Diagnosis not present

## 2021-08-11 DIAGNOSIS — M5413 Radiculopathy, cervicothoracic region: Secondary | ICD-10-CM | POA: Diagnosis not present

## 2021-08-11 DIAGNOSIS — M50321 Other cervical disc degeneration at C4-C5 level: Secondary | ICD-10-CM | POA: Diagnosis not present

## 2021-08-11 DIAGNOSIS — M9901 Segmental and somatic dysfunction of cervical region: Secondary | ICD-10-CM | POA: Diagnosis not present

## 2021-08-18 ENCOUNTER — Ambulatory Visit (INDEPENDENT_AMBULATORY_CARE_PROVIDER_SITE_OTHER): Payer: Medicare HMO | Admitting: Internal Medicine

## 2021-08-18 VITALS — BP 120/64 | HR 64 | Temp 98.0°F | Ht 61.0 in | Wt 133.2 lb

## 2021-08-18 DIAGNOSIS — R252 Cramp and spasm: Secondary | ICD-10-CM | POA: Diagnosis not present

## 2021-08-18 DIAGNOSIS — R0602 Shortness of breath: Secondary | ICD-10-CM | POA: Diagnosis not present

## 2021-08-18 DIAGNOSIS — K2900 Acute gastritis without bleeding: Secondary | ICD-10-CM

## 2021-08-18 DIAGNOSIS — R748 Abnormal levels of other serum enzymes: Secondary | ICD-10-CM

## 2021-08-18 DIAGNOSIS — R5383 Other fatigue: Secondary | ICD-10-CM

## 2021-08-18 DIAGNOSIS — R110 Nausea: Secondary | ICD-10-CM | POA: Diagnosis not present

## 2021-08-18 LAB — B12 AND FOLATE PANEL
Folate: >23.5 ng/mL (ref >5.9)
Vitamin B-12: 421 pg/mL (ref 211–911)

## 2021-08-18 LAB — IBC + FERRITIN
Ferritin: 264.8 ng/mL (ref 10.0–291.0)
Iron: 55 ug/dL (ref 42–145)
Saturation Ratios: 16.9 % — ABNORMAL LOW (ref 20.0–50.0)
TIBC: 326.2 ug/dL (ref 250.0–450.0)
Transferrin: 233 mg/dL (ref 212.0–360.0)

## 2021-08-18 LAB — CBC WITH DIFFERENTIAL/PLATELET
Basophils Absolute: 0 10*3/uL (ref 0.0–0.1)
Basophils Relative: 0.4 % (ref 0.0–3.0)
Eosinophils Absolute: 0 10*3/uL (ref 0.0–0.7)
Eosinophils Relative: 0.5 % (ref 0.0–5.0)
HCT: 40.7 % (ref 36.0–46.0)
Hemoglobin: 13.9 g/dL (ref 12.0–15.0)
Lymphocytes Relative: 19.6 % (ref 12.0–46.0)
Lymphs Abs: 1.4 10*3/uL (ref 0.7–4.0)
MCHC: 34.2 g/dL (ref 30.0–36.0)
MCV: 88.4 fl (ref 78.0–100.0)
Monocytes Absolute: 0.8 10*3/uL (ref 0.1–1.0)
Monocytes Relative: 11 % (ref 3.0–12.0)
Neutro Abs: 4.8 10*3/uL (ref 1.4–7.7)
Neutrophils Relative %: 68.5 % (ref 43.0–77.0)
Platelets: 246 10*3/uL (ref 150.0–400.0)
RBC: 4.61 Mil/uL (ref 3.87–5.11)
RDW: 13.6 % (ref 11.5–15.5)
WBC: 7 10*3/uL (ref 4.0–10.5)

## 2021-08-18 LAB — GAMMA GT: GGT: 32 U/L (ref 7–51)

## 2021-08-18 LAB — BASIC METABOLIC PANEL
BUN: 14 mg/dL (ref 6–23)
CO2: 25 mEq/L (ref 19–32)
Calcium: 8.8 mg/dL (ref 8.4–10.5)
Chloride: 92 mEq/L — ABNORMAL LOW (ref 96–112)
Creatinine, Ser: 0.79 mg/dL (ref 0.40–1.20)
GFR: 70.69 mL/min (ref >60.00)
Glucose, Bld: 130 mg/dL — ABNORMAL HIGH (ref 70–99)
Potassium: 3.8 mEq/L (ref 3.5–5.1)
Sodium: 128 mEq/L — ABNORMAL LOW (ref 135–145)

## 2021-08-18 LAB — HEPATIC FUNCTION PANEL
ALT: 22 U/L (ref 0–35)
AST: 28 U/L (ref 0–37)
Albumin: 3.9 g/dL (ref 3.5–5.2)
Alkaline Phosphatase: 481 U/L — ABNORMAL HIGH (ref 39–117)
Bilirubin, Direct: 0.2 mg/dL (ref 0.0–0.3)
Total Bilirubin: 1.3 mg/dL — ABNORMAL HIGH (ref 0.2–1.2)
Total Protein: 7 g/dL (ref 6.0–8.3)

## 2021-08-18 LAB — MAGNESIUM: Magnesium: 1.8 mg/dL (ref 1.5–2.5)

## 2021-08-18 MED ORDER — OMEPRAZOLE 20 MG PO CPDR: 20.0000 mg | DELAYED_RELEASE_CAPSULE | Freq: Two times a day (BID) | ORAL | 3 refills | Status: AC

## 2021-08-18 NOTE — Patient Instructions (Addendum)
Increase water intake to  48 ounces daily for now ? ?Stop ibuprofen and tylenol .  They can affect the liver and the ibuprofen can give you an ulcer  ? ?Please take omeprazole twice daily on an empty stomach to treat gastritis.   (Rx sent )  ? ?The nausea could be from gastritis or from your liver not liking the fluconazole and atorvastatin taken together  ? ?Use benadryl   (diphenhydramine) for sleep if needed  children's dose is 12.5 mg    ? ?YOU DO NOT HAVE THRUSH CURRENTLY.  YOUR MOUTH IS DRY.  Try keeping mouth closed with  a Goody's cloth headband worn around your chin  ? ? ?

## 2021-08-18 NOTE — Progress Notes (Addendum)
Subjective:  Patient ID: Kim Sanchez, female    DOB: 1940/11/28  Age: 80 y.o. MRN: [id number]  CC: The primary encounter diagnosis was Elevated alkaline phosphatase level. Diagnoses of Other fatigue, Hand cramps, Acute gastritis without hemorrhage, unspecified gastritis type, Nausea, Caregiver with fatigue, Muscle cramping, and Shortness of breath were also pertinent to this visit.   This visit occurred during the SARS-CoV-2 public health emergency.  Safety protocols were in place, including screening questions prior to the visit, additional usage of staff PPE, and extensive cleaning of exam room while observing appropriate contact time as indicated for disinfecting solutions.    HPI Kim Sanchez presents for follow up on persistent fatigue  and 2) reports of thrush not responding to nystatin and fluconazole  Chief Complaint  Patient presents with   Follow-up    Follow up for fatigue and low energy. Pt also w/ thrush from Remicade injections. Was given Nystatin and Fluconazole but has not helped.   1) Treated for thrush by phone by her rheumatologist.  Had  2 treatments by PHONE:  . One course was fluconazole 100 mg daily for ten days. Which she finished last week .  Has been taking atorvastatin at the same time .    2) SYMPTOMS OF DRY MOUTH AND PARCHED TONGUE ,  food tastes bad.     2)  Waking up with cramps in hands and calves and feet. For the past 2 months.   Occurring nightly for the last week     Last lasix dose several months ago.  3) New onset  nausea occurring daily when she wakes up , also occurring throughout the day.  One episode of emesis last week after taking the fluconazole. Has lost ten lbs due to lack of appetite over the last month.  Wt loss started prior to the nausea .  Has been taking ibuprofen 200 mg at night  for the nausea .  4)  Constipated.  Using laxatives   5) Persistent Fatigue:  normal ECHO done last week by [facility].   Sleep study recommmended but deferred.   Short of breath just walking to her mailbox. CBC was normal  < 4 weeks ago .        Outpatient Medications Prior to Visit  Medication Sig Dispense Refill   aspirin EC 81 MG tablet Take 81 mg by mouth daily.     atorvastatin (LIPITOR) 10 MG tablet Take 1 tablet (10 mg total) by mouth every other day. 90 tablet 1   bisoprolol (ZEBETA) 5 MG tablet Take 1 tablet by mouth once daily 30 tablet 0   calcium carbonate (TUMS - DOSED IN MG ELEMENTAL CALCIUM) 500 MG chewable tablet Chew 1 tablet by mouth daily.     chlorthalidone (HYGROTON) 25 MG tablet Take 1 tablet by mouth once daily 90 tablet 0   furosemide (LASIX) 20 MG tablet TAKE 1 TABLET BY MOUTH ONCE DAILY AS NEEDED FOR  FLUID  RETENTION 90 tablet 0   inFLIXimab (REMICADE) 100 MG injection Inject 1 mg into the vein every 8 (eight) weeks.     LORazepam (ATIVAN) 0.5 MG tablet TAKE 1 TABLET BY MOUTH EVERY 8 HOURS AS NEEDED FOR ANXIETY (MAX OF 2 TABLETS PER DAY) 60 tablet 0   Multiple Vitamins-Minerals (MULTIVITAMIN ADULT PO) Take by mouth.     Omega-3 Fatty Acids (FISH OIL PO) Take by mouth.     polyethylene glycol powder (GLYCOLAX/MIRALAX) powder Take 17 g by mouth 2 (two) times  daily as needed. 3350 g 11   nystatin (MYCOSTATIN) 100000 UNIT/ML suspension   1   No facility-administered medications prior to visit.    Review of Systems;  Patient denies headache, fevers, malaise, unintentional weight loss, skin rash, eye pain, sinus congestion and sinus pain, sore throat, dysphagia,  hemoptysis , cough, dyspnea, wheezing, chest pain, palpitations, orthopnea, edema, abdominal pain, nausea, melena, diarrhea, constipation, flank pain, dysuria, hematuria, urinary  Frequency, nocturia, numbness, tingling, seizures,  Focal weakness, Loss of consciousness,  Tremor, insomnia, depression, anxiety, and suicidal ideation.      Objective:  BP 120/64 (BP Location: Left Arm, Patient Position: Sitting, Cuff Size: Small)   Pulse 64   Temp 98 F (36.7 C)  (Oral)   Ht 5\' 1"  (1.549 m)   Wt 133 lb 3.2 oz (60.4 kg)   SpO2 97%   BMI 25.17 kg/m   BP Readings from Last 3 Encounters:  08/18/21 120/64  07/23/21 (!) 146/78  04/23/21 138/76    Wt Readings from Last 3 Encounters:  08/18/21 133 lb 3.2 oz (60.4 kg)  07/23/21 142 lb 12.8 oz (64.8 kg)  04/25/21 151 lb (68.5 kg)    General appearance: alert, cooperative and appears stated age Ears: normal TM's and external ear canals both ears Throat: lips, mucosa, and tongue normal; teeth and gums normal Neck: no adenopathy, no carotid bruit, supple, symmetrical, trachea midline and thyroid not enlarged, symmetric, no tenderness/mass/nodules Back: symmetric, no curvature. ROM normal. No CVA tenderness. Lungs: clear to auscultation bilaterally Heart: regular rate and rhythm, S1, S2 normal, no murmur, click, rub or gallop Abdomen: soft, non-tender; bowel sounds normal; no masses,  no organomegaly Pulses: 2+ and symmetric Skin: Skin color, texture, turgor normal. No rashes or lesions Lymph nodes: Cervical, supraclavicular, and axillary nodes normal.  Lab Results  Component Value Date   HGBA1C 7.4 (H) 07/23/2021   HGBA1C 6.9 (H) 04/21/2021   HGBA1C 6.9 (H) 10/15/2020    Lab Results  Component Value Date   CREATININE 0.79 08/18/2021   CREATININE 0.74 07/23/2021   CREATININE 0.76 04/21/2021    Lab Results  Component Value Date   WBC 7.0 08/18/2021   HGB 13.9 08/18/2021   HCT 40.7 08/18/2021   PLT 246.0 08/18/2021   GLUCOSE 130 (H) 08/18/2021   CHOL 243 (H) 07/23/2021   TRIG 146.0 07/23/2021   HDL 66.20 07/23/2021   LDLDIRECT 151.5 10/03/2013   LDLCALC 147 (H) 07/23/2021   ALT 22 08/18/2021   AST 28 08/18/2021   NA 128 (L) 08/18/2021   K 3.8 08/18/2021   CL 92 (L) 08/18/2021   CREATININE 0.79 08/18/2021   BUN 14 08/18/2021   CO2 25 08/18/2021   TSH 2.29 07/23/2021   HGBA1C 7.4 (H) 07/23/2021   MICROALBUR <0.7 10/15/2020    MM 3D SCREEN BREAST BILATERAL  Result Date:  12/03/2020 CLINICAL DATA:  Screening. EXAM: DIGITAL SCREENING BILATERAL MAMMOGRAM WITH TOMOSYNTHESIS AND CAD TECHNIQUE: Bilateral screening digital craniocaudal and mediolateral oblique mammograms were obtained. Bilateral screening digital breast tomosynthesis was performed. The images were evaluated with computer-aided detection. COMPARISON:  Previous exam(s). ACR Breast Density Category c: The breast tissue is heterogeneously dense, which may obscure small masses. FINDINGS: There are no findings suspicious for malignancy. IMPRESSION: No mammographic evidence of malignancy. A result letter of this screening mammogram will be mailed directly to the patient. RECOMMENDATION: Screening mammogram in one year. (Code:SM-B-01Y) BI-RADS CATEGORY  1: Negative. Electronically Signed   By: Frederico Hamman M.D.   On: 12/03/2020  15:34   Assessment & Plan:   Problem List Items Addressed This Visit     Shortness of breath    Likely multifactorial  She has mild pulmonary htn , normal LVEF        Nausea    Gastritis secondary to NSAID use vs liver dysfunction secondary to concurrent use of statin and fluconazole .  Empiric PPI,  Stop NSAIDs,  Await liver enzymes       Relevant Orders   US Abdomen Limited RUQ (LIVER/GB)   Muscle cramping    checking lytes and thyroid.  Recommend trial of pickle juice.        Caregiver with fatigue    She continues to complain of profound fatigue and dyspnea with minimal exertion.  She has a normal EF by last month's ECHO,  No anemia by last month's CBC.  Checking thyroid function and b12.  .  Deconditioning likely contributing        Elevated alkaline phosphatase level - Primary    Still rising,  T Bili elevated but GGT normal.  ruq ultrasound needed  Lab Results  Component Value Date   ALT 22 08/18/2021   AST 28 08/18/2021   ALKPHOS 481 (H) 08/18/2021   ALKPHOS 532 (H) 08/18/2021   BILITOT 1.3 (H) 08/18/2021         Relevant Orders   Hepatic function  panel (Completed)   Gamma GT (Completed)   Alkaline phosphatase, isoenzymes (Completed)   Other Visit Diagnoses     Other fatigue       Relevant Orders   IBC + Ferritin (Completed)   B12 and Folate Panel (Completed)   CBC with Differential/Platelet (Completed)   Hand cramps       Relevant Orders   Magnesium (Completed)   Basic metabolic panel (Completed)   Acute gastritis without hemorrhage, unspecified gastritis type       Relevant Medications   omeprazole (PRILOSEC) 20 MG capsule   Other Relevant Orders   H Pylori, IGM, IGG, IGA AB (Completed)       I spent a total of 30 minutes on this visit , with review of 07/23/2021  and .lastcardedicated to the care of this patient on the date of this encounter to include pre-visit review of patient's medical history,  most recent  office visits with me and patient's specialists,  most recent ER visit/hospitalization, EKG, imaging studies, Face-to-face time with the patient , and post visit ordering of testing and therapeutics.    Follow-up: Return in about 4 weeks (around 09/15/2021).   Sherlene Shams, MD

## 2021-08-19 DIAGNOSIS — R252 Cramp and spasm: Secondary | ICD-10-CM | POA: Insufficient documentation

## 2021-08-19 DIAGNOSIS — M7918 Myalgia, other site: Secondary | ICD-10-CM | POA: Diagnosis not present

## 2021-08-19 DIAGNOSIS — M9901 Segmental and somatic dysfunction of cervical region: Secondary | ICD-10-CM | POA: Diagnosis not present

## 2021-08-19 DIAGNOSIS — M5413 Radiculopathy, cervicothoracic region: Secondary | ICD-10-CM | POA: Diagnosis not present

## 2021-08-19 DIAGNOSIS — M50322 Other cervical disc degeneration at C5-C6 level: Secondary | ICD-10-CM | POA: Diagnosis not present

## 2021-08-19 DIAGNOSIS — M25512 Pain in left shoulder: Secondary | ICD-10-CM | POA: Diagnosis not present

## 2021-08-19 DIAGNOSIS — M5412 Radiculopathy, cervical region: Secondary | ICD-10-CM | POA: Diagnosis not present

## 2021-08-19 DIAGNOSIS — M50321 Other cervical disc degeneration at C4-C5 level: Secondary | ICD-10-CM | POA: Diagnosis not present

## 2021-08-19 DIAGNOSIS — M542 Cervicalgia: Secondary | ICD-10-CM | POA: Diagnosis not present

## 2021-08-19 DIAGNOSIS — R110 Nausea: Secondary | ICD-10-CM | POA: Insufficient documentation

## 2021-08-19 NOTE — Assessment & Plan Note (Signed)
checking lytes and thyroid.  Recommend trial of pickle juice.  ?

## 2021-08-19 NOTE — Assessment & Plan Note (Signed)
Gastritis secondary to NSAID use vs liver dysfunction secondary to concurrent use of statin and fluconazole .  Empiric PPI,  Stop NSAIDs,  Await liver enzymes ?

## 2021-08-19 NOTE — Assessment & Plan Note (Signed)
She continues to complain of profound fatigue and dyspnea with minimal exertion.  She has a normal EF by last month's ECHO,  No anemia by last month's CBC.  Checking thyroid function and b12.  .  Deconditioning likely contributing  ?

## 2021-08-19 NOTE — Assessment & Plan Note (Signed)
Likely multifactorial  She has mild pulmonary htn , normal LVEF  ?

## 2021-08-20 ENCOUNTER — Telehealth: Payer: Self-pay | Admitting: Internal Medicine

## 2021-08-20 LAB — ALKALINE PHOSPHATASE, ISOENZYMES
Alkaline Phosphatase: 532 IU/L — ABNORMAL HIGH (ref 44–121)
BONE FRACTION: 85 % — ABNORMAL HIGH (ref 14–68)
INTESTINAL FRAC.: 3 % (ref 0–18)
LIVER FRACTION: 12 % — ABNORMAL LOW (ref 18–85)

## 2021-08-20 LAB — H PYLORI, IGM, IGG, IGA AB
H pylori, IgM Abs: <9 units (ref 0.0–8.9)
H. pylori, IgA Abs: <9 units (ref 0.0–8.9)
H. pylori, IgG AbS: 0.25 Index Value (ref 0.00–0.79)

## 2021-08-20 NOTE — Assessment & Plan Note (Signed)
Still rising,  T Bili elevated but GGT normal.  ruq ultrasound needed ? ?Lab Results  ?Component Value Date  ? ALT 22 08/18/2021  ? AST 28 08/18/2021  ? ALKPHOS 481 (H) 08/18/2021  ? ALKPHOS 532 (H) 08/18/2021  ? BILITOT 1.3 (H) 08/18/2021  ? ? ?

## 2021-08-20 NOTE — Addendum Note (Signed)
Addended by: [name] on: 08/20/2021 09:24 AM ? ? Modules accepted: Orders ? ?

## 2021-08-20 NOTE — Telephone Encounter (Signed)
Pt returning call

## 2021-08-21 ENCOUNTER — Other Ambulatory Visit: Payer: Medicare HMO

## 2021-08-26 ENCOUNTER — Ambulatory Visit
Admission: RE | Admit: 2021-08-26 | Discharge: 2021-08-26 | Disposition: A | Discharge status: Home / Self Care | Payer: Medicare HMO | Source: Ambulatory Visit | Attending: Internal Medicine | Admitting: Internal Medicine

## 2021-08-26 DIAGNOSIS — R110 Nausea: Secondary | ICD-10-CM | POA: Diagnosis not present

## 2021-08-26 DIAGNOSIS — K760 Fatty (change of) liver, not elsewhere classified: Secondary | ICD-10-CM | POA: Diagnosis not present

## 2021-08-27 ENCOUNTER — Telehealth: Payer: Self-pay | Admitting: Internal Medicine

## 2021-08-27 NOTE — Telephone Encounter (Signed)
Patient would like a call back about her ultra sound and to send report to her GI doctor in [place]. Patient will call and get fax from GI and have ready when office calls with results.  ?

## 2021-08-27 NOTE — Telephone Encounter (Signed)
Pt left fax number for GI doctor and phone number  ?Fax [phone] Phone number [phone] ?[facility]  ?

## 2021-08-28 ENCOUNTER — Telehealth: Payer: Self-pay | Admitting: Internal Medicine

## 2021-08-28 NOTE — Telephone Encounter (Signed)
No DPR on file.  ?

## 2021-08-28 NOTE — Telephone Encounter (Signed)
Pt sister called about ultrasound results. Pt sister would like to be called regarding making an appointment for pt ?

## 2021-08-28 NOTE — Telephone Encounter (Signed)
Pt is aware of results and results have been faxed to her GI doctor.  ?

## 2021-08-29 DIAGNOSIS — R0602 Shortness of breath: Secondary | ICD-10-CM | POA: Diagnosis not present

## 2021-08-29 DIAGNOSIS — R531 Weakness: Secondary | ICD-10-CM | POA: Diagnosis not present

## 2021-08-29 DIAGNOSIS — K5000 Crohn's disease of small intestine without complications: Secondary | ICD-10-CM | POA: Diagnosis not present

## 2021-08-29 DIAGNOSIS — R748 Abnormal levels of other serum enzymes: Secondary | ICD-10-CM | POA: Diagnosis not present

## 2021-08-29 DIAGNOSIS — R110 Nausea: Secondary | ICD-10-CM | POA: Diagnosis not present

## 2021-08-29 DIAGNOSIS — K828 Other specified diseases of gallbladder: Secondary | ICD-10-CM | POA: Diagnosis not present

## 2021-08-31 DIAGNOSIS — M899 Disorder of bone, unspecified: Secondary | ICD-10-CM | POA: Diagnosis not present

## 2021-08-31 DIAGNOSIS — R531 Weakness: Secondary | ICD-10-CM | POA: Diagnosis not present

## 2021-08-31 DIAGNOSIS — R069 Unspecified abnormalities of breathing: Secondary | ICD-10-CM | POA: Diagnosis not present

## 2021-08-31 DIAGNOSIS — D849 Immunodeficiency, unspecified: Secondary | ICD-10-CM | POA: Diagnosis not present

## 2021-08-31 DIAGNOSIS — R1011 Right upper quadrant pain: Secondary | ICD-10-CM | POA: Diagnosis not present

## 2021-08-31 DIAGNOSIS — J984 Other disorders of lung: Secondary | ICD-10-CM | POA: Diagnosis not present

## 2021-08-31 DIAGNOSIS — R8281 Pyuria: Secondary | ICD-10-CM | POA: Diagnosis not present

## 2021-08-31 DIAGNOSIS — R0609 Other forms of dyspnea: Secondary | ICD-10-CM | POA: Diagnosis not present

## 2021-08-31 DIAGNOSIS — Z85118 Personal history of other malignant neoplasm of bronchus and lung: Secondary | ICD-10-CM | POA: Diagnosis not present

## 2021-08-31 DIAGNOSIS — R0602 Shortness of breath: Secondary | ICD-10-CM | POA: Diagnosis not present

## 2021-08-31 DIAGNOSIS — E119 Type 2 diabetes mellitus without complications: Secondary | ICD-10-CM | POA: Diagnosis not present

## 2021-08-31 DIAGNOSIS — R918 Other nonspecific abnormal finding of lung field: Secondary | ICD-10-CM | POA: Diagnosis not present

## 2021-08-31 DIAGNOSIS — J189 Pneumonia, unspecified organism: Secondary | ICD-10-CM | POA: Diagnosis not present

## 2021-08-31 DIAGNOSIS — K838 Other specified diseases of biliary tract: Secondary | ICD-10-CM | POA: Diagnosis not present

## 2021-08-31 DIAGNOSIS — R791 Abnormal coagulation profile: Secondary | ICD-10-CM | POA: Diagnosis not present

## 2021-08-31 DIAGNOSIS — K5090 Crohn's disease, unspecified, without complications: Secondary | ICD-10-CM | POA: Diagnosis not present

## 2021-08-31 DIAGNOSIS — Z20822 Contact with and (suspected) exposure to covid-19: Secondary | ICD-10-CM | POA: Diagnosis not present

## 2021-08-31 DIAGNOSIS — C7931 Secondary malignant neoplasm of brain: Secondary | ICD-10-CM | POA: Diagnosis not present

## 2021-08-31 DIAGNOSIS — C3490 Malignant neoplasm of unspecified part of unspecified bronchus or lung: Secondary | ICD-10-CM | POA: Diagnosis not present

## 2021-08-31 DIAGNOSIS — R519 Headache, unspecified: Secondary | ICD-10-CM | POA: Diagnosis not present

## 2021-08-31 DIAGNOSIS — M6281 Muscle weakness (generalized): Secondary | ICD-10-CM | POA: Diagnosis not present

## 2021-08-31 DIAGNOSIS — K219 Gastro-esophageal reflux disease without esophagitis: Secondary | ICD-10-CM | POA: Diagnosis not present

## 2021-08-31 DIAGNOSIS — D259 Leiomyoma of uterus, unspecified: Secondary | ICD-10-CM | POA: Diagnosis not present

## 2021-08-31 DIAGNOSIS — J90 Pleural effusion, not elsewhere classified: Secondary | ICD-10-CM | POA: Diagnosis not present

## 2021-08-31 DIAGNOSIS — C7951 Secondary malignant neoplasm of bone: Secondary | ICD-10-CM | POA: Diagnosis not present

## 2021-08-31 DIAGNOSIS — R748 Abnormal levels of other serum enzymes: Secondary | ICD-10-CM | POA: Diagnosis not present

## 2021-09-01 DIAGNOSIS — M899 Disorder of bone, unspecified: Secondary | ICD-10-CM | POA: Insufficient documentation

## 2021-09-01 DIAGNOSIS — J90 Pleural effusion, not elsewhere classified: Secondary | ICD-10-CM | POA: Insufficient documentation

## 2021-09-04 DIAGNOSIS — C7951 Secondary malignant neoplasm of bone: Secondary | ICD-10-CM | POA: Insufficient documentation

## 2021-09-04 DIAGNOSIS — C3490 Malignant neoplasm of unspecified part of unspecified bronchus or lung: Secondary | ICD-10-CM | POA: Insufficient documentation

## 2021-09-05 ENCOUNTER — Telehealth: Payer: Self-pay | Admitting: Internal Medicine

## 2021-09-05 NOTE — Telephone Encounter (Signed)
Pt sister wanting to talk to provider about pt. Pt is living with sister now and sister want provider to prescribe palliative care. Pt sister is not on the DPR. pt sister want to be called ?

## 2021-09-08 ENCOUNTER — Telehealth: Payer: Self-pay | Admitting: Oncology

## 2021-09-08 NOTE — Telephone Encounter (Signed)
Patient sister Kim Sanchez called and stated she was in the ER at [facility] over the weekend and learned her cancer has spread. She would like to schedule an appointment. Please advise. Thank you  ?

## 2021-09-08 NOTE — Telephone Encounter (Signed)
LMTCB

## 2021-09-09 ENCOUNTER — Telehealth: Payer: Self-pay | Admitting: Internal Medicine

## 2021-09-09 ENCOUNTER — Encounter: Payer: Self-pay | Admitting: Internal Medicine

## 2021-09-09 ENCOUNTER — Encounter: Payer: Self-pay | Admitting: *Deleted

## 2021-09-09 ENCOUNTER — Inpatient Hospital Stay: Payer: Medicare HMO | Attending: Oncology | Admitting: Oncology

## 2021-09-09 ENCOUNTER — Encounter: Payer: Self-pay | Admitting: Oncology

## 2021-09-09 VITALS — BP 106/63 | HR 65 | Temp 97.8°F | Resp 14 | Ht 61.0 in

## 2021-09-09 DIAGNOSIS — C7931 Secondary malignant neoplasm of brain: Secondary | ICD-10-CM | POA: Diagnosis present

## 2021-09-09 DIAGNOSIS — M1990 Unspecified osteoarthritis, unspecified site: Secondary | ICD-10-CM | POA: Diagnosis not present

## 2021-09-09 DIAGNOSIS — C3490 Malignant neoplasm of unspecified part of unspecified bronchus or lung: Secondary | ICD-10-CM | POA: Diagnosis not present

## 2021-09-09 DIAGNOSIS — Z853 Personal history of malignant neoplasm of breast: Secondary | ICD-10-CM | POA: Insufficient documentation

## 2021-09-09 DIAGNOSIS — C7951 Secondary malignant neoplasm of bone: Secondary | ICD-10-CM | POA: Diagnosis not present

## 2021-09-09 DIAGNOSIS — Z79899 Other long term (current) drug therapy: Secondary | ICD-10-CM | POA: Insufficient documentation

## 2021-09-09 DIAGNOSIS — C3491 Malignant neoplasm of unspecified part of right bronchus or lung: Secondary | ICD-10-CM | POA: Insufficient documentation

## 2021-09-09 DIAGNOSIS — Z923 Personal history of irradiation: Secondary | ICD-10-CM | POA: Insufficient documentation

## 2021-09-09 DIAGNOSIS — Z7982 Long term (current) use of aspirin: Secondary | ICD-10-CM | POA: Diagnosis not present

## 2021-09-09 NOTE — Progress Notes (Signed)
?[place]  ?[phone] [phone] ? ?ID: Kim Sanchez OB: [birth year]  MR#: [id number]  WYO#:[id number] ? ?Patient Care Team: ?[name], MD as PCP - General (Internal Medicine) ?[name], MD (General Surgery) ?[name], MD (Internal Medicine) ?Telford Nab, RN as Sales executive ? 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 ? ?INTERVAL HISTORY: Patient last evaluated in clinic in November 2021.  Patient states she was in her usual state of health until approximately 4 weeks ago when she became progressively short of breath, increased weakness and fatigue, and a 15 pound unintentional weight loss.  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  Diagnosis was obtained by thoracentesis.  She has decreased performance status, but offers no specific complaints.  She denies any pain.  She has no neurologic complaints.  She denies any recent fevers.  She has a poor appetite.  She has no chest pain, shortness of breath, cough, or hemoptysis.  She denies any nausea, vomiting, constipation, or diarrhea.  She has no urinary complaints.  Patient offers no further specific complaints today. ? ?REVIEW OF SYSTEMS:   ?Review of Systems  ?Constitutional:  Positive for malaise/fatigue. Negative for fever and weight loss.  ?Respiratory: Negative.  Negative for cough and shortness of breath.   ?Cardiovascular: Negative.  Negative for chest pain and leg swelling.  ?Gastrointestinal: Negative.  Negative for abdominal pain.  ?Genitourinary: Negative.  Negative for dysuria.  ?Musculoskeletal: Negative.  Negative for back pain.  ?Skin: Negative.  Negative for rash.  ?Neurological:  Positive for weakness. Negative for sensory change and focal weakness.   ?Psychiatric/Behavioral: Negative.  The patient is not nervous/anxious and does not have insomnia.   ?As per HPI. Otherwise, a complete review of systems is negative. ? ?PAST MEDICAL HISTORY: ?Past Medical History:  ?Diagnosis Date  ? Arthritis   ? Breast cancer ([place])   ? Breast cancer, right breast ([place]) 08/24/2012  ? Histologic grade 1, invasive mammary carcinoma, no specific type. 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  ? Crohn's disease ([place])   ? GERD (gastroesophageal reflux disease)   ? History of blood transfusion 1960  ? Hypertension   ? Personal history of radiation therapy   ? ? ?PAST SURGICAL HISTORY: ?Past Surgical History:  ?Procedure Laterality Date  ? BRAIN SURGERY  1980  ? breast biopsies, benign  ? BREAST BIOPSY Right 2014  ? Invasive Mammory Carcinoma  ? BREAST BIOPSY Bilateral   ? - core Bx- neg  ? BREAST LUMPECTOMY Right May 2014  ? T1c, N0; ER/PR positive, HER-2/neu not overexpressing.  ? BREAST SURGERY  1980's  ? fibro cyst both breast in [place]  ? COLONOSCOPY  2017  ? DILATION AND CURETTAGE OF UTERUS    ? ? ?FAMILY HISTORY ?Family History  ?Problem Relation Age of Onset  ? Cancer Paternal Grandmother   ? Heart disease Sister   ? Diabetes Sister   ? Breast cancer Neg Hx   ? ? ?  ? ADVANCED DIRECTIVES:  ? ? ?HEALTH MAINTENANCE: ?Social History  ? ?Tobacco Use  ? Smoking status: Never  ? Smokeless tobacco: Never  ?Vaping Use  ? Vaping Use: Never used  ?Substance Use Topics  ? Alcohol use: Yes  ?  Alcohol/week: 1.0  standard drink  ?  Types: 1 Glasses of wine per week  ?  Comment: OCC  ? Drug use: No  ? ? ? Colonoscopy: ? PAP: ? Bone density: ? Lipid panel: ? ?Allergies  ?Allergen Reactions  ? Chlorthalidone Shortness Of Breath  ? Letrozole Anaphylaxis and Itching  ? Methocarbamol Shortness Of Breath and Swelling  ? Metoprolol Shortness Of Breath  ?  Cough, chest discomfort  ? Amlodipine Itching  ? Exemestane Nausea Only  ? Hydrochlorothiazide  Other (See Comments)  ? Lexapro [Escitalopram Oxalate] Other (See Comments)  ?  intolerance  ? Lisinopril Cough  ? Losartan   ?  Facial swelling,  Tongue swelling   ? Sulfa Antibiotics Other (See Comments)  ? ? ?Current Outpatient Medications  ?Medication Sig Dispense Refill  ? bisoprolol (ZEBETA) 5 MG tablet Take 1 tablet by mouth once daily 30 tablet 0  ? chlorthalidone (HYGROTON) 25 MG tablet Take 1 tablet by mouth once daily 90 tablet 0  ? furosemide (LASIX) 20 MG tablet TAKE 1 TABLET BY MOUTH ONCE DAILY AS NEEDED FOR  FLUID  RETENTION 90 tablet 0  ? inFLIXimab (REMICADE) 100 MG injection Inject 1 mg into the vein every 8 (eight) weeks.    ? LORazepam (ATIVAN) 0.5 MG tablet TAKE 1 TABLET BY MOUTH EVERY 8 HOURS AS NEEDED FOR ANXIETY (MAX OF 2 TABLETS PER DAY) 60 tablet 0  ? omeprazole (PRILOSEC) 20 MG capsule Take 1 capsule (20 mg total) by mouth 2 (two) times daily before a meal. 60 capsule 3  ? polyethylene glycol powder (GLYCOLAX/MIRALAX) powder Take 17 g by mouth 2 (two) times daily as needed. 3350 g 11  ? TYLENOL 500 MG tablet TAKE 2 TABLETS BY MOUTH EVERY 8 HOURS    ? aspirin EC 81 MG tablet Take 81 mg by mouth daily. (Patient not taking: Reported on 09/09/2021)    ? atorvastatin (LIPITOR) 10 MG tablet Take 1 tablet (10 mg total) by mouth every other day. (Patient not taking: Reported on 09/09/2021) 90 tablet 1  ? calcium carbonate (TUMS - DOSED IN MG ELEMENTAL CALCIUM) 500 MG chewable tablet Chew 1 tablet by mouth daily. (Patient not taking: Reported on 09/09/2021)    ? Multiple Vitamins-Minerals (MULTIVITAMIN ADULT PO) Take by mouth. (Patient not taking: Reported on 09/09/2021)    ? Omega-3 Fatty Acids (FISH OIL PO) Take by mouth. (Patient not taking: Reported on 09/09/2021)    ? ?No current facility-administered medications for this visit.  ? ? ?OBJECTIVE: ?Vitals:  ? 09/09/21 0942  ?BP: 106/63  ?Pulse: 65  ?Resp: 14  ?Temp: 97.8 ?F (36.6 ?C)  ?SpO2: 96%  ?   Body mass index is 25.17 kg/m?Marland Kitchen    ECOG FS:3 -  Symptomatic, >50% confined to bed ? ?General: Well-developed, well-nourished, no acute distress.  Sitting in a wheelchair. ?Eyes: Pink conjunctiva, anicteric sclera. ?HEENT: Normocephalic, moist mucous membranes. ?Lungs: No audible wheezing or coughing. ?Heart: Regular rate and rhythm. ?Abdomen: Soft, nontender, no obvious distention. ?Musculoskeletal: No edema, cyanosis, or clubbing. ?Neuro: Alert, answering all questions appropriately. Cranial nerves grossly intact. ?Skin: No rashes or petechiae noted. ?Psych: Normal affect. ? ? ?LAB RESULTS: ? ?Lab Results  ?Component Value Date  ? NA 128 (L) 08/18/2021  ? K 3.8 08/18/2021  ? CL 92 (L) 08/18/2021  ? CO2 25 08/18/2021  ? GLUCOSE 130 (H) 08/18/2021  ? BUN 14 08/18/2021  ? CREATININE 0.79 08/18/2021  ? CALCIUM 8.8 08/18/2021  ? PROT 7.0 08/18/2021  ? ALBUMIN  3.9 08/18/2021  ? AST 28 08/18/2021  ? ALT 22 08/18/2021  ? ALKPHOS 481 (H) 08/18/2021  ? ALKPHOS 532 (H) 08/18/2021  ? BILITOT 1.3 (H) 08/18/2021  ? GFRNONAA >60 12/31/2016  ? GFRAA >60 12/31/2016  ? ? ?Lab Results  ?Component Value Date  ? WBC 7.0 08/18/2021  ? NEUTROABS 4.8 08/18/2021  ? HGB 13.9 08/18/2021  ? HCT 40.7 08/18/2021  ? MCV 88.4 08/18/2021  ? PLT 246.0 08/18/2021  ? ? ? ?STUDIES: ?US Abdomen Limited RUQ (LIVER/GB) ? ?Result Date: 08/26/2021 ?CLINICAL DATA:  Elevated total bili and alk-phos EXAM: ULTRASOUND ABDOMEN LIMITED RIGHT UPPER QUADRANT COMPARISON:  None. FINDINGS: Gallbladder: Echogenic sludge. No gallstones or wall thickening visualized. No sonographic Murphy sign noted by sonographer. Common bile duct: Diameter: 4.3 mm Liver: No focal lesion identified. Increased parenchymal echogenicity. Portal vein is patent on color Doppler imaging with normal direction of blood flow towards the liver. Other: None. IMPRESSION: 1. Sludge is seen in the gallbladder. 2. Hepatic steatosis. Electronically Signed   By: Yetta Glassman M.D.   On: 08/26/2021 17:20   ? ?ASSESSMENT: Stage Ia ER/PR positive,  HER-2 negative adenocarcinoma of the right breast, unspecified location.  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 ? ?PLAN:   ? ?Stage IV adenocarcinoma of the lung: Imaging and pathology completed at [facility] and review

## 2021-09-09 NOTE — Progress Notes (Signed)
Met with patient and her son during initial consult with Dr. Grayland Ormond to discuss recent results from [facility]. All questions answered during visit. Contact info given and instructed to call with any questions or needs. Pt and her son verbalized understanding. ?

## 2021-09-09 NOTE — Telephone Encounter (Signed)
Amy from [facility] called stating she was suppose to start care with pt but she had an oncology appointment today and they declined all services ?

## 2021-09-09 NOTE — Telephone Encounter (Signed)
FYI

## 2021-09-10 ENCOUNTER — Inpatient Hospital Stay (HOSPITAL_BASED_OUTPATIENT_CLINIC_OR_DEPARTMENT_OTHER): Payer: Medicare HMO | Admitting: Hospice and Palliative Medicine

## 2021-09-10 DIAGNOSIS — Z515 Encounter for palliative care: Secondary | ICD-10-CM

## 2021-09-10 NOTE — Progress Notes (Signed)
I was unable to reach patient or son by phone.  Voicemail left.  I did speak with the hospice admission nurse who said that patient was having generalized pain and headaches unrelieved with acetaminophen and ibuprofen.  Okay to start tramadol 25 to 50 mg every 6 hours as needed and dexamethasone 2 mg daily. ?

## 2021-09-11 ENCOUNTER — Other Ambulatory Visit: Payer: Self-pay | Admitting: *Deleted

## 2021-09-11 MED ORDER — TRAMADOL HCL 50 MG PO TABS: 25.0000 mg | ORAL_TABLET | Freq: Four times a day (QID) | ORAL | 0 refills | Status: AC | PRN

## 2021-09-11 NOTE — Telephone Encounter (Signed)
Pt has been scheduled with palliative care/ hospice.  ?

## 2021-09-11 NOTE — Telephone Encounter (Signed)
[facility] called stating they got orders for prescription on Hospice letterhead and they need prescription from [place] for the Tramadol. Please send prescription. ?

## 2021-09-12 ENCOUNTER — Inpatient Hospital Stay: Payer: Medicare HMO | Admitting: Internal Medicine

## 2021-09-15 ENCOUNTER — Other Ambulatory Visit: Payer: Self-pay | Admitting: Hospice and Palliative Medicine

## 2021-09-15 MED ORDER — MORPHINE SULFATE (CONCENTRATE) 10 MG /0.5 ML PO SOLN
5.0000 mg | ORAL | 0 refills | Status: AC | PRN
Start: 2021-09-15 — End: ?

## 2021-09-15 NOTE — Progress Notes (Signed)
Hospice nurse called and requested comfort meds. Rx for morphine sent for pain/dsypnea ?

## 2021-10-02 DEATH — deceased

## 2021-10-21 ENCOUNTER — Other Ambulatory Visit: Payer: Medicare Other

## 2021-10-22 ENCOUNTER — Ambulatory Visit: Payer: Medicare Other | Admitting: Internal Medicine
